# Patient Record
Sex: Male | Born: 1950 | Race: White | Hispanic: No | Marital: Married | Smoking: Former smoker
Health system: Southern US, Community
[De-identification: ages and names within clinical notes are randomized; demographics above are authoritative.]

## PROBLEM LIST (undated history)

## (undated) DIAGNOSIS — M1380 Other specified arthritis, unspecified site: Secondary | ICD-10-CM

## (undated) DIAGNOSIS — I4891 Unspecified atrial fibrillation: Secondary | ICD-10-CM

## (undated) DIAGNOSIS — I509 Heart failure, unspecified: Secondary | ICD-10-CM

## (undated) DIAGNOSIS — F0150 Vascular dementia without behavioral disturbance: Secondary | ICD-10-CM

## (undated) DIAGNOSIS — N183 Chronic kidney disease, stage 3 unspecified: Secondary | ICD-10-CM

## (undated) DIAGNOSIS — I700 Atherosclerosis of aorta: Secondary | ICD-10-CM

## (undated) DIAGNOSIS — R011 Cardiac murmur, unspecified: Secondary | ICD-10-CM

## (undated) DIAGNOSIS — I499 Cardiac arrhythmia, unspecified: Secondary | ICD-10-CM

## (undated) DIAGNOSIS — E119 Type 2 diabetes mellitus without complications: Secondary | ICD-10-CM

## (undated) DIAGNOSIS — I739 Peripheral vascular disease, unspecified: Secondary | ICD-10-CM

## (undated) DIAGNOSIS — M1990 Unspecified osteoarthritis, unspecified site: Secondary | ICD-10-CM

## (undated) DIAGNOSIS — I5030 Unspecified diastolic (congestive) heart failure: Secondary | ICD-10-CM

## (undated) DIAGNOSIS — I10 Essential (primary) hypertension: Secondary | ICD-10-CM

## (undated) DIAGNOSIS — R4586 Emotional lability: Secondary | ICD-10-CM

## (undated) DIAGNOSIS — E559 Vitamin D deficiency, unspecified: Secondary | ICD-10-CM

## (undated) DIAGNOSIS — Z79899 Other long term (current) drug therapy: Secondary | ICD-10-CM

## (undated) DIAGNOSIS — F419 Anxiety disorder, unspecified: Secondary | ICD-10-CM

## (undated) DIAGNOSIS — I639 Cerebral infarction, unspecified: Secondary | ICD-10-CM

## (undated) DIAGNOSIS — I6789 Other cerebrovascular disease: Secondary | ICD-10-CM

## (undated) DIAGNOSIS — Z7902 Long term (current) use of antithrombotics/antiplatelets: Secondary | ICD-10-CM

## (undated) DIAGNOSIS — E7800 Pure hypercholesterolemia, unspecified: Secondary | ICD-10-CM

## (undated) DIAGNOSIS — K219 Gastro-esophageal reflux disease without esophagitis: Secondary | ICD-10-CM

## (undated) DIAGNOSIS — M48062 Spinal stenosis, lumbar region with neurogenic claudication: Secondary | ICD-10-CM

## (undated) HISTORY — PX: BACK SURGERY: SHX140

## (undated) HISTORY — DX: Heart failure, unspecified: I50.9

## (undated) HISTORY — PX: COLONOSCOPY: SHX174

## (undated) HISTORY — DX: Cardiac arrhythmia, unspecified: I49.9

---

## 2016-07-17 DIAGNOSIS — I10 Essential (primary) hypertension: Secondary | ICD-10-CM | POA: Insufficient documentation

## 2016-07-17 DIAGNOSIS — E782 Mixed hyperlipidemia: Secondary | ICD-10-CM | POA: Insufficient documentation

## 2016-07-17 DIAGNOSIS — E1140 Type 2 diabetes mellitus with diabetic neuropathy, unspecified: Secondary | ICD-10-CM | POA: Insufficient documentation

## 2016-07-17 DIAGNOSIS — E538 Deficiency of other specified B group vitamins: Secondary | ICD-10-CM | POA: Insufficient documentation

## 2016-07-17 DIAGNOSIS — I493 Ventricular premature depolarization: Secondary | ICD-10-CM | POA: Insufficient documentation

## 2018-02-23 DIAGNOSIS — E1169 Type 2 diabetes mellitus with other specified complication: Secondary | ICD-10-CM | POA: Insufficient documentation

## 2018-07-09 DIAGNOSIS — I4891 Unspecified atrial fibrillation: Secondary | ICD-10-CM | POA: Insufficient documentation

## 2018-12-14 ENCOUNTER — Other Ambulatory Visit: Payer: Self-pay

## 2018-12-14 ENCOUNTER — Encounter: Payer: Self-pay | Admitting: Dietician

## 2018-12-14 ENCOUNTER — Encounter: Payer: Medicare Other | Attending: Surgery | Admitting: Dietician

## 2018-12-14 VITALS — Ht 74.0 in | Wt 267.3 lb

## 2018-12-14 DIAGNOSIS — Z794 Long term (current) use of insulin: Secondary | ICD-10-CM

## 2018-12-14 DIAGNOSIS — E119 Type 2 diabetes mellitus without complications: Secondary | ICD-10-CM | POA: Diagnosis not present

## 2018-12-14 DIAGNOSIS — E1169 Type 2 diabetes mellitus with other specified complication: Secondary | ICD-10-CM

## 2018-12-14 NOTE — Patient Instructions (Signed)
Establish a meal pattern of 3 meals spaced 4-5 hours apart. Balance meals with 2-4 oz protein, 2-3 servings of carbohydrate (30-45 gms) and non-starchy vegetables. Can include 1 serving of carbohydrate between and 1 oz protein between meals. Check Fasting Blood Sugars regularly in addition to pre -dinner meals.

## 2018-12-14 NOTE — Progress Notes (Signed)
Medical Nutrition Therapy:  Visit start time: 6440   end [id number]  Assessment:  Diagnosis: Type 2 diabetes Past medical history: hyperlipidemia, hypertension, B12 deficiency Psychosocial issues/ stress concerns:  Patient rates his stress as moderate and indicates "ok" as to how well he is dealing with his stress  Preferred learning method:  . Visual  Current weight:267.3 lbs Height: 74 in BMI: 34.32 Weight goal: 230 lbs Medications, supplements: see list  Progress and evaluation:  Patient accompanied by his wife in for initial medical nutrition therapy appointment. He reports he has not been consistent with checking his blood glucose in the past but that prior to July, '20, his readings had been in 140's-150's. The readings since then have been in 200's-300's.  He has had 2 steroid injections in his knees in the past 4 months.  He reports a 25 lb weight loss since April '20. He has been following a Keto diet, trying not to exceed 25 gms of carbohydrate daily but states there has not been an improvement in his glucose. He is increasing Toujeo by 2 unit increments as instructed by MD and FBG's remains in the 200's (230's to 270's). Food records indicate, as he reported, a very low intake of carbohydrate; eats mainly protein foods-some high fat choices. He does include some non-starchy vegetables    Physical activity: walks approximately 1 mile daily.  Dietary Intake:  Usual eating pattern includes 3 meals and 2 snacks per day. Dining out frequency: 1 meals per week.  Breakfast: 3 boiled eggs or 3 egg omelette with cheese/onions, 16 oz coffee with half/half Snack:pecans or mixed nuts Lunch:12-1:00 - leftover salad Snack: low carb yogurt, nuts Supper: Caesar salad with chicken Beverages: 4 (32oz) water, coffee, no sugar sweetened beverages.  Nutrition Care Education:  Diabetes:   Instructed on a meal plan for diabetes including carbohydrate counting and how to better balance  carbohydrate, protein, healthy fats and non-starchy vegetables. Expressed concern regarding fat content of Keto diet and difficulty of sustaining an extremely low carbohydrate diet. Discussed how food is not the only factor with glucose and his elevated readings are not necessarily diet related. Discussed difficulty of achieving glucose in recommended range during the day if fasting glucose is in 200's and how MD's instruction to keep increasing Toujeo in the evenings should help to decrease FBG's. Patient states he meets with PCP, 12/24/18, to follow-up regarding diabetes.   Nutritional Diagnosis:  NI-5.8.1 Inadequate carbohydrate intake As related to following Keto diet and limiting carbohydrate to 25 gms daily.  As evidenced by diet history..  Intervention:  Establish a meal pattern of 3 meals spaced 4-5 hours apart. Balance meals with 2-4 oz protein, 2-3 servings of carbohydrate (30-45 gms) and non-starchy vegetables. Can include 1 serving of carbohydrate between and 1 oz protein between meals. Check Fasting Blood Sugars regularly in addition to pre -dinner meals.  Education Materials given:  . Plate Planner . Food lists/ Planning A Balanced Meal . Sample meal pattern/ menus . Goals/ instructions  Learner/ who was taught:  . Patient  . Spouse/ partner Level of understanding: Marland Kitchen Verbalizes understanding  Demonstrated degree of understanding via:   Teach back Learning barriers: . None  Willingness to learn/ readiness for change: . Eager, change in progress  Monitoring and Evaluation:  Dietary intake, exercise,and body weight      follow up: 01/12/19 at 9:00am with Pam

## 2019-01-12 ENCOUNTER — Encounter: Payer: Self-pay | Admitting: Dietician

## 2019-01-12 ENCOUNTER — Other Ambulatory Visit: Payer: Self-pay

## 2019-01-12 ENCOUNTER — Encounter: Payer: Medicare Other | Attending: Surgery | Admitting: Dietician

## 2019-01-12 VITALS — Ht 74.0 in | Wt 271.3 lb

## 2019-01-12 DIAGNOSIS — E1169 Type 2 diabetes mellitus with other specified complication: Secondary | ICD-10-CM

## 2019-01-12 DIAGNOSIS — Z794 Long term (current) use of insulin: Secondary | ICD-10-CM

## 2019-01-12 DIAGNOSIS — E119 Type 2 diabetes mellitus without complications: Secondary | ICD-10-CM | POA: Insufficient documentation

## 2019-01-12 NOTE — Patient Instructions (Signed)
   Continue to increase daily physical activity by walking more often, and/or incorporating some stretches/ calisthenics every hour when sitting/ working.   Gradually include a consistent amount of carb foods with meals, start with 15-30 grams with every meal and gradually increase to 30-45 grams.   Combining carb and protein lessens the effect on blood sugar.

## 2019-01-12 NOTE — Progress Notes (Signed)
Medical Nutrition Therapy: Visit start time: 0930  end time: 1000  Assessment:  Diagnosis: type 2 diabetes Medical history changes: no changes Psychosocial issues/ stress concerns: none  Current weight: 271.3lbs Height: 6'2" Medications, supplement changes: reconciled list in medical record; now taking   Progress and evaluation:   Patient reports adding some carb foods into diet, including some beans in soup. He feels he should add back. Potato or rice ever 1-2 weeks. Occasional sandwich with low-carb bread or hamburger bun.  Weight has increased by 4lbs since previous visit; patient likely wearing heavier clothing which could have altered today's measurement.   Patient states he and his [place], and he works from home during the day, so physical activity is limited.   He reports some improvement in BGs with insulin change to 70/30, and is testing more regularly.      Physical activity: walking 20 minutes 2x daily; kayaking 2x a month for 2 hours  Dietary Intake:  Usual eating pattern includes 3 meals and 1-2 snacks per day. Dining out frequency: not assessed today.  Breakfast: coffee with splenda, small amount cream Snack: none or nuts or occasional low-carb yogurt Lunch: leftovers, often salad Snack: 1/4 cup nuts -- peanuts, almonds, cashews Supper: salad 3-4x a week with chicken or other meat (pork, beef, shrimp) Snack: none Beverages: 1c. coffee in am then water  Nutrition Care Education: Topics covered: diabetes, weight management Basic nutrition: appropriate nutrient balance, general nutrition guidelines    Weight control: reviewed progress since previous visit, estimated kcal needs for weight loss at about 1600kcal and provided breakdown of 40%CHO, 30% protein, and 30% fat per patient preference; discussed importance of limiting hidden sources of fat and sugar, portion control strategies, role of physical activity Diabetes: appropriate carb  intake and balance, healthy carb choices for lower glycemic response, functions of carbohydrate, role of fiber, protein, benefits of consistent carb intake.   Nutritional Diagnosis:  Gary-2.2 Altered nutrition-related laboratory As related to Type 2 diabetes.  As evidenced by patient with recent HbA1C of 10.5%. [place]-3.3 Overweight/obesity As related to excess calories and limited activity.  As evidenced by patient with current BMI of 34, and patient report of dietary intake.  Intervention:   Instruction and discussion as noted above.  Patient has been working on gradual diet changes to control blood sugars and promote weight loss.   Updated goals with input from patient.   Education Materials given:  . Plate Planner with food lists . Exercise packet . Goals/ instructions   Learner/ who was taught:  . Patient  . Spouse/ partner   Level of understanding: Marland Kitchen Verbalizes/ demonstrates competency   Demonstrated degree of understanding via:   Teach back Learning barriers: . None   Willingness to learn/ readiness for change: . Eager, change in progress   Monitoring and Evaluation:  Dietary intake, exercise, BG control, and body weight      follow up: 02/23/19 at 9:30am

## 2019-02-21 ENCOUNTER — Encounter: Payer: Self-pay | Admitting: Dietician

## 2019-02-21 NOTE — Progress Notes (Signed)
Patient rescheduled his MNT appointment from 02/23/19 to 03/09/19.

## 2019-02-23 ENCOUNTER — Ambulatory Visit: Payer: Medicare Other | Admitting: Dietician

## 2019-03-09 ENCOUNTER — Ambulatory Visit: Payer: Medicare Other | Admitting: Dietician

## 2019-03-28 ENCOUNTER — Ambulatory Visit: Payer: Medicare Other | Admitting: Dietician

## 2019-04-18 ENCOUNTER — Encounter: Payer: Self-pay | Admitting: Dietician

## 2019-04-18 NOTE — Progress Notes (Signed)
Have not heard back from patient after missing his 3rd subsequently scheduled appointment for 03/28/19. Sent notification to referring provider.

## 2019-06-03 ENCOUNTER — Ambulatory Visit: Payer: Medicare Other | Attending: Internal Medicine

## 2019-06-03 DIAGNOSIS — Z23 Encounter for immunization: Secondary | ICD-10-CM | POA: Insufficient documentation

## 2019-06-03 NOTE — Progress Notes (Signed)
   Covid-19 Vaccination Clinic  Name:  Tahjai Schetter    MRN: [id number] DOB: May 17, 1950  06/03/2019  Mr. Ammar was observed post Covid-19 immunization for 15 minutes without incidence. He was provided with Vaccine Information Sheet and instruction to access the V-Safe system.   Mr. Mcparland was instructed to call 911 with any severe reactions post vaccine: Marland Kitchen Difficulty breathing  . Swelling of your face and throat  . A fast heartbeat  . A bad rash all over your body  . Dizziness and weakness    Immunizations Administered    Name Date Dose VIS Date Route   Pfizer COVID-19 Vaccine 06/03/2019 11:00 AM 0.3 mL 04/01/2019 Intramuscular   Manufacturer: [employer], [employer]   Lot: [id number]   NDC: [address]-3

## 2019-06-29 ENCOUNTER — Ambulatory Visit: Payer: Medicare Other | Attending: Internal Medicine

## 2019-06-29 DIAGNOSIS — Z23 Encounter for immunization: Secondary | ICD-10-CM | POA: Insufficient documentation

## 2019-06-29 NOTE — Progress Notes (Signed)
   Covid-19 Vaccination Clinic  Name:  Kristopher Oneal    MRN: [id number] DOB: [birth year]  06/29/2019  [name] was observed post Covid-19 immunization for 15 minutes without incident. He was provided with Vaccine Information Sheet and instruction to access the V-Safe system.   [name] was instructed to call 911 with any severe reactions post vaccine: Marland Kitchen Difficulty breathing  . Swelling of face and throat  . A fast heartbeat  . A bad rash all over body  . Dizziness and weakness   Immunizations Administered    Name Date Dose VIS Date Route   Pfizer COVID-19 Vaccine 06/29/2019  9:15 AM 0.3 mL 04/01/2019 Intramuscular   Manufacturer: [employer], [employer]   Lot: [id number]   NDC: [address]-4

## 2019-12-27 ENCOUNTER — Other Ambulatory Visit: Payer: Self-pay

## 2019-12-27 ENCOUNTER — Encounter
Admission: RE | Admit: 2019-12-27 | Discharge: 2019-12-27 | Disposition: A | Discharge status: Home / Self Care | Payer: Medicare Other | Source: Ambulatory Visit | Attending: Orthopedic Surgery | Admitting: Orthopedic Surgery

## 2019-12-27 DIAGNOSIS — Z01818 Encounter for other preprocedural examination: Secondary | ICD-10-CM | POA: Insufficient documentation

## 2019-12-27 DIAGNOSIS — Z01810 Encounter for preprocedural cardiovascular examination: Secondary | ICD-10-CM

## 2019-12-27 HISTORY — DX: Type 2 diabetes mellitus without complications: E11.9

## 2019-12-27 HISTORY — DX: Essential (primary) hypertension: I10

## 2019-12-27 HISTORY — DX: Cardiac arrhythmia, unspecified: I49.9

## 2019-12-27 HISTORY — DX: Unspecified osteoarthritis, unspecified site: M19.90

## 2019-12-27 LAB — URINALYSIS, ROUTINE W REFLEX MICROSCOPIC
Bilirubin Urine: NEGATIVE
Glucose, UA: NEGATIVE mg/dL
Hgb urine dipstick: NEGATIVE
Ketones, ur: NEGATIVE mg/dL
Leukocytes,Ua: NEGATIVE
Nitrite: NEGATIVE
Protein, ur: NEGATIVE mg/dL
Specific Gravity, Urine: 1.009 (ref 1.005–1.030)
pH: 5 (ref 5.0–8.0)

## 2019-12-27 LAB — HEMOGLOBIN A1C
Hgb A1c MFr Bld: 7.2 % — ABNORMAL HIGH (ref 4.8–5.6)
Mean Plasma Glucose: 159.94 mg/dL

## 2019-12-27 LAB — SEDIMENTATION RATE: Sed Rate: 8 mm/hr (ref 0–20)

## 2019-12-27 LAB — COMPREHENSIVE METABOLIC PANEL
ALT: 25 U/L (ref 0–44)
AST: 27 U/L (ref 15–41)
Albumin: 4.5 g/dL (ref 3.5–5.0)
Alkaline Phosphatase: 36 U/L — ABNORMAL LOW (ref 38–126)
Anion gap: 11 (ref 5–15)
BUN: 32 mg/dL — ABNORMAL HIGH (ref 8–23)
CO2: 27 mmol/L (ref 22–32)
Calcium: 9.8 mg/dL (ref 8.9–10.3)
Chloride: 100 mmol/L (ref 98–111)
Creatinine, Ser: 1.29 mg/dL — ABNORMAL HIGH (ref 0.61–1.24)
GFR calc Af Amer: >60 mL/min (ref >60)
GFR calc non Af Amer: 55 mL/min — ABNORMAL LOW (ref >60)
Glucose, Bld: 135 mg/dL — ABNORMAL HIGH (ref 70–99)
Potassium: 3.8 mmol/L (ref 3.5–5.1)
Sodium: 138 mmol/L (ref 135–145)
Total Bilirubin: 1 mg/dL (ref 0.3–1.2)
Total Protein: 7.9 g/dL (ref 6.5–8.1)

## 2019-12-27 LAB — CBC
HCT: 43.8 % (ref 39.0–52.0)
Hemoglobin: 15.3 g/dL (ref 13.0–17.0)
MCH: 32.4 pg (ref 26.0–34.0)
MCHC: 34.9 g/dL (ref 30.0–36.0)
MCV: 92.8 fL (ref 80.0–100.0)
Platelets: 234 10*3/uL (ref 150–400)
RBC: 4.72 MIL/uL (ref 4.22–5.81)
RDW: 13.3 % (ref 11.5–15.5)
WBC: 9.9 10*3/uL (ref 4.0–10.5)
nRBC: 0 % (ref 0.0–0.2)

## 2019-12-27 LAB — PROTIME-INR
INR: 1 (ref 0.8–1.2)
Prothrombin Time: 12.8 seconds (ref 11.4–15.2)

## 2019-12-27 LAB — C-REACTIVE PROTEIN: CRP: <0.5 mg/dL (ref <1.0)

## 2019-12-27 LAB — TYPE AND SCREEN
ABO/RH(D): A POS
Antibody Screen: NEGATIVE

## 2019-12-27 LAB — SURGICAL PCR SCREEN
MRSA, PCR: NEGATIVE
Staphylococcus aureus: NEGATIVE

## 2019-12-27 LAB — APTT: aPTT: 29 seconds (ref 24–36)

## 2019-12-27 NOTE — Patient Instructions (Addendum)
Your procedure is scheduled on: Mon 9/13 Report to Day Surgery. To find out your arrival time please call [phone] between 1PM - 3PM on Friday 9/10.  Remember: Instructions that are not followed completely may result in serious medical risk,  up to and including death, or upon the discretion of your surgeon and anesthesiologist your  surgery may need to be rescheduled.     _X__ 1. Do not eat food after midnight the night before your procedure.                 No chewing gum or hard candies. You may drink clear liquids up to 2 hours                 before you are scheduled to arrive for your surgery- DO not drink clear                 liquids within 2 hours of the start of your surgery.                 Clear Liquids include:  water, G2 or                  Gatorade Zero (avoid Red/Purple/Blue), Black Coffee or Tea (Do not add                 anything to coffee or tea). _x____2.   Complete G2.   2 hours before you arrive  __X__2.  On the morning of surgery brush your teeth with toothpaste and water, you                may rinse your mouth with mouthwash if you wish.  Do not swallow any toothpaste of mouthwash.     _X__ 3.  No Alcohol for 24 hours before or after surgery.   ___ 4.  Do Not Smoke or use e-cigarettes For 24 Hours Prior to Your Surgery.                 Do not use any chewable tobacco products for at least 6 hours prior to                 Surgery.  ___  5.  Do not use any recreational drugs (marijuana, cocaine, heroin, ecstasy, MDMA or other)                For at least one week prior to your surgery.  Combination of these drugs with anesthesia                May have life threatening results.  ____  6.  Bring all medications with you on the day of surgery if instructed.   __x__  7.  Notify your doctor if there is any change in your medical condition      (cold, fever, infections).     Do not wear jewelry,. Do not wear lotions, You may wear  deodorant. Do not shave 48 hours prior to surgery. Men may shave face and neck. Do not bring valuables to the hospital.    [facility] is not responsible for any belongings or valuables.  Contacts, dentures or bridgework may not be worn into surgery. Leave your suitcase in the car. After surgery it may be brought to your room. For patients admitted to the hospital, discharge time is determined by your treatment team.   Patients discharged the day of surgery will not be allowed to drive home.  Make arrangements for someone to be with you for the first 24 hours of your Same Day Discharge.    Please read over the following fact sheets that you were given:   Incentive spirometer    __x__ Take these medicines the morning of surgery with A SIP OF WATER:    1. fenofibrate 160 MG tablet  2. metoprolol succinate (TOPROL-XL) 100 MG 24 hr tablet  3.   4.  5.  6.  ____ Fleet Enema (as directed)   __x__ Use CHG Soap (or wipes) as directed  ____ Use Benzoyl Peroxide Gel as instructed  ____ Use inhalers on the day of surgery  __x__ Stop metformin 2 days prior to surgery Last dose on 9/10   __x__ Take 1/2 of usual insulin dose the night before surgery. No insulin the morning          of surgery.   __x__ Stop aspirin today  __x__ Stop Anti-inflammatories on meloxicam (MOBIC) 15 MG tablet today   May take tylenol   __x__ Stop supplements until after surgery.    ____ Bring C-Pap to the hospital.    If you have any questions regarding your pre-procedure instructions,  Please call Pre-admit Testing at [phone] [facility]

## 2019-12-28 LAB — URINE CULTURE
Culture: <10000 — AB
Special Requests: NORMAL

## 2019-12-29 ENCOUNTER — Other Ambulatory Visit
Admission: RE | Admit: 2019-12-29 | Discharge: 2019-12-29 | Disposition: A | Discharge status: Home / Self Care | Payer: Medicare Other | Source: Ambulatory Visit | Attending: Orthopedic Surgery | Admitting: Orthopedic Surgery

## 2019-12-29 ENCOUNTER — Other Ambulatory Visit: Payer: Self-pay

## 2019-12-29 DIAGNOSIS — Z01812 Encounter for preprocedural laboratory examination: Secondary | ICD-10-CM | POA: Diagnosis present

## 2019-12-29 DIAGNOSIS — Z20822 Contact with and (suspected) exposure to covid-19: Secondary | ICD-10-CM | POA: Insufficient documentation

## 2019-12-30 LAB — SARS CORONAVIRUS 2 (TAT 6-24 HRS): SARS Coronavirus 2: NEGATIVE

## 2020-01-01 ENCOUNTER — Encounter: Payer: Self-pay | Admitting: Orthopedic Surgery

## 2020-01-01 NOTE — H&P (Signed)
ORTHOPAEDIC HISTORY & PHYSICAL Progress Notes [name], [place] - 12/27/2019 8:45 AM EDT [facility] Chief Complaint:   Chief Complaint  Patient presents with  . Knee Pain  H & P RIGHT KNEE   History of Present Illness:   Kristopher Oneal is a 69 y.o. male that presents to clinic today for his preoperative history and evaluation. Patient presents with his wife. The patient is scheduled to undergo a right total knee arthroplasty on 01/02/20 by Dr. Marry Guan. His pain began several years ago. The pain is located primarily along the medial aspect of the knee. He describes his pain as worse with weight bearing, standing, and walking. He reports associated minimal swelling with some giving way of the knee. He denies associated numbness or tingling, denies locking of the knee.   The patient's symptoms have progressed to the point that they decrease his quality of life. The patient has previously undergone conservative treatment including NSAIDS and injections to the knee without adequate control of his symptoms.  Of note, patient reports a history of lower back surgery but no hardware. He denies significant cardiac history, denies history of blood clots.   Past Medical, Surgical, Family, Social History, Allergies, Medications:   Past Medical History:  Past Medical History:  Diagnosis Date  . Benign essential hypertension 07/17/2016  . Chronic painful diabetic neuropathy (CMS-HCC) 07/17/2016  . Controlled type 2 diabetes mellitus with complication, with long-term current use of insulin (CMS-HCC) 07/17/2016  . Hyperlipidemia, mixed 07/17/2016  . 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 LAMINECTOMY LUMBAR SPINE 1993   Current Medications:  Current Outpatient Medications  Medication Sig Dispense Refill  . diltiazem (DILT-XR) 240 MG XR capsule Take 240 mg by mouth once daily  .  ascorbic acid, vitamin C, (VITAMIN C) 1000 MG tablet Take 1,000 mg by mouth once daily Per patient  . aspirin 325 MG EC tablet Take 325 mg by mouth once daily  . bumetanide (BUMEX) 1 MG tablet Take 1 tablet (1 mg total) by mouth once daily 90 tablet 3  . cyanocobalamin/cobamamide (B12 SL) Place under the tongue 1000 mcg one daily  . fenofibrate 160 MG tablet TAKE 1 TABLET BY MOUTH ONCE DAILY 90 tablet 3  . flash glucose sensor (FREESTYLE LIBRE 14 DAY SENSOR) kit Use 1 kit every 14 (fourteen) days 2 kit 12  . FREESTYLE LIBRE 14 DAY reader Use 1 Device as directed 1 each 0  . gabapentin (NEURONTIN) 300 MG capsule TAKE 3 CAPSULES BY MOUTH AT NIGHT 270 capsule 3  . glipiZIDE (GLUCOTROL) 5 MG tablet Take 1 tablet (5 mg total) by mouth every morning before breakfast for 90 days 90 tablet 3  . glucosam-chondroitin-diet cb25 116-100 mg Cap Take 1 capsule by mouth once daily  . insulin NPH-REGULAR (HUMULIN 70/30 PEN) 100 unit/mL (70-30) pen injector 60 units in the morning before breakfast and 30 in the evening before dinner (Patient taking differently: Novolin 70/30, 60 units in the morning before breakfast and 35 units at bedtime 10/28/2019 ) 90 mL 1  . lovastatin (MEVACOR) 40 MG tablet TAKE 1 TABLET BY MOUTH DAILY WITH DINNER 90 tablet 3  . meloxicam (MOBIC) 15 MG tablet TAKE 1 TABLET BY MOUTH ONCE DAILY 90 tablet 3  . metFORMIN (GLUCOPHAGE) 1000 MG tablet Take 1 tablet (1,000 mg total) by mouth 2 (two) times daily Take 1 tablet by mouth (1000 mg total)  twice daily 180 tablet 3  . metoprolol succinate (TOPROL-XL) 100 MG XL tablet TAKE 1 TABLET BY MOUTH ONCE DAILY 90 tablet 3  . multivitamin tablet Take 1 tablet by mouth once daily  . multivitamin tablet Take by mouth  . pen needle, diabetic 32 gauge x 5/32" Ndle Use 1 each 2 (two) times daily 100 each 4  . potassium gluconate 2.5 mEq Tab Take by mouth  . triamterene-hydrochlorothiazide (DYAZIDE) 37.5-25 mg capsule Take 1 capsule by mouth every morning 90  capsule 3   No current facility-administered medications for this visit.   Allergies: No Known Allergies  Social History:  Social History   Socioeconomic History  . Marital status: Married  Spouse name: Helene Kelp  . Number of children: 1  . Years of education: 57  . Highest education level: Not on file  Occupational History  . Occupation: Full-time- Teaching laboratory technician  Tobacco Use  . Smoking status: Former Smoker  Types: Cigarettes  Quit date: 1980  Years since quitting: 41.7  . Smokeless tobacco: Never Used  Vaping Use  . Vaping Use: Never used  Substance and Sexual Activity  . Alcohol use: No  . Drug use: Never  . Sexual activity: Defer  Partners: Female  Other Topics Concern  . Not on file  Social History Narrative  . Not on file   Social Determinants of Health   Financial Resource Strain:  . Difficulty of Paying Living Expenses:  Food Insecurity:  . Worried About Charity fundraiser in the Last Year:  . Arboriculturist in the Last Year:  Transportation Needs:  . Film/video editor (Medical):  Marland Kitchen Lack of Transportation (Non-Medical):  Physical Activity:  . Days of Exercise per Week:  . Minutes of Exercise per Session:  Stress:  . Feeling of Stress :  Social Connections:  . Frequency of Communication with Friends and Family:  . Frequency of Social Gatherings with Friends and Family:  . Attends Religious Services:  . Active Member of Clubs or Organizations:  . Attends Archivist Meetings:  Marland Kitchen Marital Status:   Family History:  Family History  Problem Relation Age of Onset  . Alzheimer's disease Mother  . No Known Problems Father   Review of Systems:   A 10+ ROS was performed, reviewed, and the pertinent orthopaedic findings are documented in the HPI.   Physical Examination:   BP 120/70  Ht 188 cm (_0 )  Wt (!) 123.3 kg (271 lb 12.8 oz)  BMI 34.90 kg/m   Patient is a well-developed, well-nourished male in no acute distress. Patient has  normal mood and affect. Patient is alert and oriented to person, place, and time.   HEENT: Atraumatic, normocephalic. Pupils equal and reactive to light. Extraocular motion intact. Noninjected sclera.  Cardiovascular: Irregular rate and rhythm, with no murmurs, rubs, or gallops. Distal pulses auscultated with doppler.  Respiratory: Lungs clear to auscultation bilaterally.   Right Knee: Soft tissue swelling: minimal Effusion: none Erythema: none Crepitance: mild Tenderness: medial Alignment: relative varus Mediolateral laxity: medial pseudolaxity Posterior sag: negative Patellar tracking: Good tracking without evidence of subluxation or tilt Atrophy: No significant atrophy.  Quadriceps tone was good. Range of motion: 0/6/123 degrees  Sensation intact over the saphenous, lateral sural cutaneous, superficial fibular, and deep fibular nerve distributions.  Tests Performed/Reviewed:  X-rays  No new radiographs were obtained today. Previous radiographs were reviewed of the right knee and revealed complete loss of medial compartment joint space with bone-on-bone contact and  mild osteophyte formation. Lateral compartment reveals mild loss of joint space with osteophyte formation. Patellofemoral compartment reveals loss of joint space with osteophyte formation. No fractures or dislocations.  Impression:   ICD-10-CM  1. Primary osteoarthritis of right knee M17.11   Plan:   The patient has end-stage degenerative changes of the right knee. It was explained to the patient that the condition is progressive in nature. Having failed conservative treatment, the patient has elected to proceed with a total joint arthroplasty. The patient will undergo a total joint arthroplasty with Dr. Marry Guan. The risks of surgery, including blood clot and infection, were discussed with the patient. Measures to reduce these risks, including the use of anticoagulation, perioperative antibiotics, and early ambulation  were discussed. The importance of postoperative physical therapy was discussed with the patient. The patient elects to proceed with surgery. The patient is instructed to stop all blood thinners prior to surgery. The patient is instructed to call the hospital the day before surgery to learn of the proper arrival time.   Contact our office with any questions or concerns. Follow up as indicated, or sooner should any new problems arise, if conditions worsen, or if they are otherwise concerned.   [name], PA-C [facility] [place], [place] [address] Phone: [phone]  This note was generated in part with voice recognition software and I apologize for any typographical errors that were not detected and corrected.   Electronically signed by [name], PA at 12/27/2019 10:20 AM EDT

## 2020-01-02 ENCOUNTER — Ambulatory Visit: Payer: Medicare Other | Admitting: Urgent Care

## 2020-01-02 ENCOUNTER — Encounter
Admission: RE | Disposition: A | Discharge status: Home / Self Care | Payer: Self-pay | Source: Home / Self Care | Attending: Orthopedic Surgery

## 2020-01-02 ENCOUNTER — Ambulatory Visit: Payer: Medicare Other

## 2020-01-02 ENCOUNTER — Ambulatory Visit
Admission: RE | Admit: 2020-01-02 | Discharge: 2020-01-02 | Disposition: A | Discharge status: Home / Self Care | Payer: Medicare Other | Attending: Orthopedic Surgery | Admitting: Orthopedic Surgery

## 2020-01-02 ENCOUNTER — Other Ambulatory Visit: Payer: Self-pay

## 2020-01-02 ENCOUNTER — Encounter: Payer: Self-pay | Admitting: Orthopedic Surgery

## 2020-01-02 DIAGNOSIS — E782 Mixed hyperlipidemia: Secondary | ICD-10-CM | POA: Diagnosis not present

## 2020-01-02 DIAGNOSIS — M1711 Unilateral primary osteoarthritis, right knee: Secondary | ICD-10-CM | POA: Insufficient documentation

## 2020-01-02 DIAGNOSIS — Z96659 Presence of unspecified artificial knee joint: Secondary | ICD-10-CM

## 2020-01-02 DIAGNOSIS — I10 Essential (primary) hypertension: Secondary | ICD-10-CM | POA: Insufficient documentation

## 2020-01-02 DIAGNOSIS — Z87891 Personal history of nicotine dependence: Secondary | ICD-10-CM | POA: Insufficient documentation

## 2020-01-02 DIAGNOSIS — Z7982 Long term (current) use of aspirin: Secondary | ICD-10-CM | POA: Insufficient documentation

## 2020-01-02 DIAGNOSIS — Z96651 Presence of right artificial knee joint: Secondary | ICD-10-CM

## 2020-01-02 DIAGNOSIS — E1140 Type 2 diabetes mellitus with diabetic neuropathy, unspecified: Secondary | ICD-10-CM | POA: Diagnosis not present

## 2020-01-02 DIAGNOSIS — Z79899 Other long term (current) drug therapy: Secondary | ICD-10-CM | POA: Insufficient documentation

## 2020-01-02 DIAGNOSIS — Z794 Long term (current) use of insulin: Secondary | ICD-10-CM | POA: Insufficient documentation

## 2020-01-02 HISTORY — PX: KNEE ARTHROPLASTY: SHX992

## 2020-01-02 LAB — GLUCOSE, CAPILLARY
Glucose-Capillary: 240 mg/dL — ABNORMAL HIGH (ref 70–99)
Glucose-Capillary: 277 mg/dL — ABNORMAL HIGH (ref 70–99)
Glucose-Capillary: 308 mg/dL — ABNORMAL HIGH (ref 70–99)

## 2020-01-02 LAB — ABO/RH: ABO/RH(D): A POS

## 2020-01-02 SURGERY — ARTHROPLASTY, KNEE, TOTAL, USING IMAGELESS COMPUTER-ASSISTED NAVIGATION
Anesthesia: Spinal | Site: Knee | Laterality: Right

## 2020-01-02 MED ORDER — TRANEXAMIC ACID-NACL 1000-0.7 MG/100ML-% IV SOLN
1000.0000 mg | Freq: Once | INTRAVENOUS | Status: AC
  Administered 2020-01-02: 1000 mg via INTRAVENOUS

## 2020-01-02 MED ORDER — CELECOXIB 200 MG PO CAPS
ORAL_CAPSULE | ORAL | Status: AC
  Administered 2020-01-02: 400 mg via ORAL
  Filled 2020-01-02: qty 2

## 2020-01-02 MED ORDER — TRANEXAMIC ACID-NACL 1000-0.7 MG/100ML-% IV SOLN
1000.0000 mg | INTRAVENOUS | Status: AC
  Administered 2020-01-02: 1000 mg via INTRAVENOUS

## 2020-01-02 MED ORDER — FAMOTIDINE 20 MG PO TABS
ORAL_TABLET | ORAL | Status: AC
  Administered 2020-01-02: 20 mg via ORAL
  Filled 2020-01-02: qty 1

## 2020-01-02 MED ORDER — PHENYLEPHRINE HCL (PRESSORS) 10 MG/ML IV SOLN
INTRAVENOUS | Status: AC
  Filled 2020-01-02: qty 1

## 2020-01-02 MED ORDER — TRAMADOL HCL 50 MG PO TABS: 50.0000 mg | ORAL_TABLET | Freq: Four times a day (QID) | ORAL | 0 refills | Status: DC | PRN

## 2020-01-02 MED ORDER — LACTATED RINGERS IV BOLUS
500.0000 mL | Freq: Once | INTRAVENOUS | Status: AC
  Administered 2020-01-02: 500 mL via INTRAVENOUS

## 2020-01-02 MED ORDER — BISACODYL 10 MG RE SUPP: 10.0000 mg | Freq: Every day | RECTAL | Status: DC | PRN

## 2020-01-02 MED ORDER — MENTHOL 3 MG MT LOZG: 1.0000 | LOZENGE | OROMUCOSAL | Status: DC | PRN

## 2020-01-02 MED ORDER — TRAMADOL HCL 50 MG PO TABS: 50.0000 mg | ORAL_TABLET | ORAL | Status: DC | PRN

## 2020-01-02 MED ORDER — BUPIVACAINE HCL (PF) 0.25 % IJ SOLN
INTRAMUSCULAR | Status: AC
  Filled 2020-01-02: qty 360

## 2020-01-02 MED ORDER — ACETAMINOPHEN 10 MG/ML IV SOLN
INTRAVENOUS | Status: DC | PRN
  Administered 2020-01-02: 1000 mg via INTRAVENOUS

## 2020-01-02 MED ORDER — BUPIVACAINE HCL (PF) 0.25 % IJ SOLN
INTRAMUSCULAR | Status: DC | PRN
  Administered 2020-01-02: 60 mL

## 2020-01-02 MED ORDER — CELECOXIB 200 MG PO CAPS: 200.0000 mg | ORAL_CAPSULE | Freq: Two times a day (BID) | ORAL | 2 refills | Status: DC

## 2020-01-02 MED ORDER — DEXMEDETOMIDINE (PRECEDEX) IN NS 20 MCG/5ML (4 MCG/ML) IV SYRINGE
PREFILLED_SYRINGE | INTRAVENOUS | Status: AC
  Filled 2020-01-02: qty 5

## 2020-01-02 MED ORDER — ONDANSETRON HCL 4 MG PO TABS: 4.0000 mg | ORAL_TABLET | Freq: Four times a day (QID) | ORAL | Status: DC | PRN

## 2020-01-02 MED ORDER — PROPOFOL 500 MG/50ML IV EMUL
INTRAVENOUS | Status: AC
  Filled 2020-01-02: qty 50

## 2020-01-02 MED ORDER — LACTATED RINGERS IV BOLUS: 250.0000 mL | Freq: Once | INTRAVENOUS | Status: DC

## 2020-01-02 MED ORDER — DEXMEDETOMIDINE HCL 200 MCG/2ML IV SOLN
INTRAVENOUS | Status: DC | PRN
  Administered 2020-01-02 (×2): 8 ug via INTRAVENOUS
  Administered 2020-01-02: 4 ug via INTRAVENOUS

## 2020-01-02 MED ORDER — FENTANYL CITRATE (PF) 100 MCG/2ML IJ SOLN: 25.0000 ug | INTRAMUSCULAR | Status: DC | PRN

## 2020-01-02 MED ORDER — ALUM & MAG HYDROXIDE-SIMETH 200-200-20 MG/5ML PO SUSP: 30.0000 mL | ORAL | Status: DC | PRN

## 2020-01-02 MED ORDER — SODIUM CHLORIDE 0.9 % IV SOLN
INTRAVENOUS | Status: DC | PRN
  Administered 2020-01-02: 60 mL

## 2020-01-02 MED ORDER — METOCLOPRAMIDE HCL 10 MG PO TABS: 10.0000 mg | ORAL_TABLET | Freq: Three times a day (TID) | ORAL | Status: DC

## 2020-01-02 MED ORDER — FENTANYL CITRATE (PF) 100 MCG/2ML IJ SOLN
INTRAMUSCULAR | Status: AC
  Filled 2020-01-02: qty 2

## 2020-01-02 MED ORDER — MAGNESIUM HYDROXIDE 400 MG/5ML PO SUSP: 30.0000 mL | Freq: Every day | ORAL | Status: DC

## 2020-01-02 MED ORDER — DEXAMETHASONE SODIUM PHOSPHATE 10 MG/ML IJ SOLN: 8.0000 mg | Freq: Once | INTRAMUSCULAR | Status: AC

## 2020-01-02 MED ORDER — PHENYLEPHRINE HCL (PRESSORS) 10 MG/ML IV SOLN
INTRAVENOUS | Status: DC | PRN
  Administered 2020-01-02: 100 ug via INTRAVENOUS

## 2020-01-02 MED ORDER — BUPIVACAINE HCL (PF) 0.5 % IJ SOLN
INTRAMUSCULAR | Status: DC | PRN
  Administered 2020-01-02: 3 mL

## 2020-01-02 MED ORDER — GLIPIZIDE 5 MG PO TABS
5.0000 mg | ORAL_TABLET | Freq: Every day | ORAL | Status: AC
  Administered 2020-01-02: 5 mg via ORAL
  Filled 2020-01-02: qty 1

## 2020-01-02 MED ORDER — BUPIVACAINE LIPOSOME 1.3 % IJ SUSP
INTRAMUSCULAR | Status: AC
  Filled 2020-01-02: qty 40

## 2020-01-02 MED ORDER — FAMOTIDINE 20 MG PO TABS: 20.0000 mg | ORAL_TABLET | Freq: Once | ORAL | Status: AC

## 2020-01-02 MED ORDER — CEFAZOLIN SODIUM-DEXTROSE 2-4 GM/100ML-% IV SOLN: 2.0000 g | Freq: Four times a day (QID) | INTRAVENOUS | Status: DC

## 2020-01-02 MED ORDER — CEFAZOLIN SODIUM-DEXTROSE 2-4 GM/100ML-% IV SOLN
INTRAVENOUS | Status: AC
  Administered 2020-01-02: 2 g via INTRAVENOUS
  Filled 2020-01-02: qty 100

## 2020-01-02 MED ORDER — SODIUM CHLORIDE FLUSH 0.9 % IV SOLN
INTRAVENOUS | Status: AC
  Filled 2020-01-02: qty 20

## 2020-01-02 MED ORDER — SODIUM CHLORIDE 0.9 % IV SOLN: INTRAVENOUS | Status: DC

## 2020-01-02 MED ORDER — PHENOL 1.4 % MT LIQD: 1.0000 | OROMUCOSAL | Status: DC | PRN

## 2020-01-02 MED ORDER — OXYCODONE HCL 5 MG PO TABS: 5.0000 mg | ORAL_TABLET | ORAL | 0 refills | Status: DC | PRN

## 2020-01-02 MED ORDER — PROPOFOL 500 MG/50ML IV EMUL
INTRAVENOUS | Status: DC | PRN
  Administered 2020-01-02: 50 ug/kg/min via INTRAVENOUS

## 2020-01-02 MED ORDER — PANTOPRAZOLE SODIUM 40 MG PO TBEC: 40.0000 mg | DELAYED_RELEASE_TABLET | Freq: Two times a day (BID) | ORAL | Status: DC

## 2020-01-02 MED ORDER — SODIUM CHLORIDE FLUSH 0.9 % IV SOLN
INTRAVENOUS | Status: AC
  Filled 2020-01-02: qty 80

## 2020-01-02 MED ORDER — ORAL CARE MOUTH RINSE: 15.0000 mL | Freq: Once | OROMUCOSAL | Status: AC

## 2020-01-02 MED ORDER — PROPOFOL 10 MG/ML IV BOLUS
INTRAVENOUS | Status: AC
  Filled 2020-01-02: qty 20

## 2020-01-02 MED ORDER — CHLORHEXIDINE GLUCONATE 0.12 % MT SOLN: 15.0000 mL | Freq: Once | OROMUCOSAL | Status: AC

## 2020-01-02 MED ORDER — TRANEXAMIC ACID-NACL 1000-0.7 MG/100ML-% IV SOLN
INTRAVENOUS | Status: AC
  Filled 2020-01-02: qty 100

## 2020-01-02 MED ORDER — CHLORHEXIDINE GLUCONATE 4 % EX LIQD: 60.0000 mL | Freq: Once | CUTANEOUS | Status: DC

## 2020-01-02 MED ORDER — DEXAMETHASONE SODIUM PHOSPHATE 10 MG/ML IJ SOLN
INTRAMUSCULAR | Status: AC
  Administered 2020-01-02: 8 mg via INTRAVENOUS
  Filled 2020-01-02: qty 1

## 2020-01-02 MED ORDER — NEOMYCIN-POLYMYXIN B GU 40-200000 IR SOLN
Status: DC | PRN
  Administered 2020-01-02: 16 mL

## 2020-01-02 MED ORDER — FLEET ENEMA 7-19 GM/118ML RE ENEM: 1.0000 | ENEMA | Freq: Once | RECTAL | Status: DC | PRN

## 2020-01-02 MED ORDER — SENNOSIDES-DOCUSATE SODIUM 8.6-50 MG PO TABS
1.0000 | ORAL_TABLET | Freq: Two times a day (BID) | ORAL | Status: DC
Start: 2020-01-02 — End: 2020-01-02

## 2020-01-02 MED ORDER — CEFAZOLIN SODIUM-DEXTROSE 2-4 GM/100ML-% IV SOLN
2.0000 g | INTRAVENOUS | Status: AC
  Administered 2020-01-02: 2 g via INTRAVENOUS

## 2020-01-02 MED ORDER — INSULIN ASPART 100 UNIT/ML ~~LOC~~ SOLN
SUBCUTANEOUS | Status: AC
  Administered 2020-01-02: 5 [IU]
  Filled 2020-01-02: qty 1

## 2020-01-02 MED ORDER — ACETAMINOPHEN 10 MG/ML IV SOLN: 1000.0000 mg | Freq: Four times a day (QID) | INTRAVENOUS | Status: DC

## 2020-01-02 MED ORDER — ENSURE ENLIVE PO LIQD: 296.0000 mL | Freq: Once | ORAL | Status: DC

## 2020-01-02 MED ORDER — METFORMIN HCL 500 MG PO TABS
1000.0000 mg | ORAL_TABLET | Freq: Two times a day (BID) | ORAL | Status: AC
  Administered 2020-01-02: 1000 mg via ORAL
  Filled 2020-01-02: qty 2

## 2020-01-02 MED ORDER — SURGIPHOR WOUND IRRIGATION SYSTEM - OPTIME
TOPICAL | Status: DC | PRN
  Administered 2020-01-02: 450 mL via TOPICAL

## 2020-01-02 MED ORDER — ENOXAPARIN SODIUM 40 MG/0.4ML ~~LOC~~ SOLN: 40.0000 mg | SUBCUTANEOUS | Status: DC

## 2020-01-02 MED ORDER — ONDANSETRON HCL 4 MG/2ML IJ SOLN: 4.0000 mg | Freq: Four times a day (QID) | INTRAMUSCULAR | Status: DC | PRN

## 2020-01-02 MED ORDER — GABAPENTIN 300 MG PO CAPS: 300.0000 mg | ORAL_CAPSULE | Freq: Once | ORAL | Status: AC

## 2020-01-02 MED ORDER — BUPIVACAINE HCL (PF) 0.5 % IJ SOLN
INTRAMUSCULAR | Status: AC
  Filled 2020-01-02: qty 10

## 2020-01-02 MED ORDER — DIPHENHYDRAMINE HCL 12.5 MG/5ML PO ELIX: 12.5000 mg | ORAL_SOLUTION | ORAL | Status: DC | PRN

## 2020-01-02 MED ORDER — CELECOXIB 200 MG PO CAPS: 400.0000 mg | ORAL_CAPSULE | Freq: Once | ORAL | Status: AC

## 2020-01-02 MED ORDER — SODIUM CHLORIDE 0.9 % IV SOLN
INTRAVENOUS | Status: DC | PRN
  Administered 2020-01-02: 50 ug/min via INTRAVENOUS

## 2020-01-02 MED ORDER — ACETAMINOPHEN 10 MG/ML IV SOLN
INTRAVENOUS | Status: AC
  Filled 2020-01-02: qty 100

## 2020-01-02 MED ORDER — CEFAZOLIN SODIUM-DEXTROSE 2-4 GM/100ML-% IV SOLN
INTRAVENOUS | Status: AC
  Filled 2020-01-02: qty 100

## 2020-01-02 MED ORDER — FERROUS SULFATE 325 (65 FE) MG PO TABS: 325.0000 mg | ORAL_TABLET | Freq: Two times a day (BID) | ORAL | Status: DC

## 2020-01-02 MED ORDER — ENOXAPARIN SODIUM 40 MG/0.4ML ~~LOC~~ SOLN: 40.0000 mg | SUBCUTANEOUS | 0 refills | Status: DC

## 2020-01-02 MED ORDER — GABAPENTIN 300 MG PO CAPS
ORAL_CAPSULE | ORAL | Status: AC
  Administered 2020-01-02: 300 mg via ORAL
  Filled 2020-01-02: qty 1

## 2020-01-02 MED ORDER — CHLORHEXIDINE GLUCONATE 0.12 % MT SOLN
OROMUCOSAL | Status: AC
  Administered 2020-01-02: 15 mL via OROMUCOSAL
  Filled 2020-01-02: qty 15

## 2020-01-02 MED ORDER — INSULIN ASPART 100 UNIT/ML ~~LOC~~ SOLN: 5.0000 [IU] | Freq: Once | SUBCUTANEOUS | Status: DC

## 2020-01-02 MED ORDER — ONDANSETRON HCL 4 MG/2ML IJ SOLN: 4.0000 mg | Freq: Once | INTRAMUSCULAR | Status: DC | PRN

## 2020-01-02 SURGICAL SUPPLY — 81 items
ATTUNE MED DOME PAT 41 KNEE (Knees) ×1 IMPLANT
ATTUNE PS FEM RT SZ 6 CEM KNEE (Femur) ×1 IMPLANT
ATTUNE PSRP INSR SZ6 6 KNEE (Insert) ×1 IMPLANT
BASE TIBIAL ROT PLAT SZ 8 KNEE (Knees) IMPLANT
BATTERY INSTRU NAVIGATION (MISCELLANEOUS) ×8 IMPLANT
BLADE SAW 70X12.5 (BLADE) ×2 IMPLANT
BLADE SAW 90X13X1.19 OSCILLAT (BLADE) ×2 IMPLANT
BLADE SAW 90X25X1.19 OSCILLAT (BLADE) ×2 IMPLANT
BONE CEMENT GENTAMICIN (Cement) ×2 IMPLANT
BSPLAT TIB 8 CMNT ROT PLAT STR (Knees) ×1 IMPLANT
BTRY SRG DRVR LF (MISCELLANEOUS) ×4
CANISTER PREVENA 45 (CANNISTER) ×1 IMPLANT
CANISTER PREVENA PLUS 150 (CANNISTER) IMPLANT
CANISTER SUCT 3000ML PPV (MISCELLANEOUS) ×2 IMPLANT
CEMENT BONE GENTAMICIN 40 (Cement) IMPLANT
COOLER POLAR GLACIER W/PUMP (MISCELLANEOUS) ×2 IMPLANT
COVER WAND RF STERILE (DRAPES) ×2 IMPLANT
CUFF TOURN SGL QUICK 30 (TOURNIQUET CUFF) ×2
CUFF TRNQT CYL 30X4X21-28X (TOURNIQUET CUFF) IMPLANT
DRAPE 3/4 80X56 (DRAPES) ×2 IMPLANT
DRSG DERMACEA 8X12 NADH (GAUZE/BANDAGES/DRESSINGS) ×2 IMPLANT
DRSG MEPILEX SACRM 8.7X9.8 (GAUZE/BANDAGES/DRESSINGS) ×2 IMPLANT
DRSG OPSITE POSTOP 4X14 (GAUZE/BANDAGES/DRESSINGS) ×3 IMPLANT
DRSG TEGADERM 4X4.75 (GAUZE/BANDAGES/DRESSINGS) ×2 IMPLANT
DURAPREP 26ML APPLICATOR (WOUND CARE) ×4 IMPLANT
ELECT REM PT RETURN 9FT ADLT (ELECTROSURGICAL) ×2
ELECTRODE REM PT RTRN 9FT ADLT (ELECTROSURGICAL) ×1 IMPLANT
EX-PIN ORTHOLOCK NAV 4X150 (PIN) ×4 IMPLANT
GLOVE BIO SURGEON STRL SZ7.5 (GLOVE) ×4 IMPLANT
GLOVE BIOGEL M STRL SZ7.5 (GLOVE) ×4 IMPLANT
GLOVE BIOGEL PI IND STRL 7.5 (GLOVE) ×1 IMPLANT
GLOVE BIOGEL PI INDICATOR 7.5 (GLOVE) ×1
GLOVE INDICATOR 8.0 STRL GRN (GLOVE) ×2 IMPLANT
GOWN STRL REUS W/ TWL LRG LVL3 (GOWN DISPOSABLE) ×2 IMPLANT
GOWN STRL REUS W/ TWL XL LVL3 (GOWN DISPOSABLE) ×1 IMPLANT
GOWN STRL REUS W/TWL LRG LVL3 (GOWN DISPOSABLE) ×4
GOWN STRL REUS W/TWL XL LVL3 (GOWN DISPOSABLE) ×2
HEMOVAC 400CC 10FR (MISCELLANEOUS) ×2 IMPLANT
HOLDER FOLEY CATH W/STRAP (MISCELLANEOUS) ×2 IMPLANT
HOOD PEEL AWAY FLYTE STAYCOOL (MISCELLANEOUS) ×4 IMPLANT
IRRIGATION SURGIPHOR STRL (IV SOLUTION) ×2 IMPLANT
KIT PREVENA INCISION MGT20CM45 (CANNISTER) ×1 IMPLANT
KIT TURNOVER KIT A (KITS) ×2 IMPLANT
KNIFE SCULPS 14X20 (INSTRUMENTS) ×2 IMPLANT
LABEL OR SOLS (LABEL) ×2 IMPLANT
MANIFOLD NEPTUNE II (INSTRUMENTS) ×3 IMPLANT
NDL SAFETY ECLIPSE 18X1.5 (NEEDLE) ×1 IMPLANT
NDL SPNL 20GX3.5 QUINCKE YW (NEEDLE) ×2 IMPLANT
NEEDLE HYPO 18GX1.5 SHARP (NEEDLE) ×2
NEEDLE SPNL 20GX3.5 QUINCKE YW (NEEDLE) ×4 IMPLANT
NS IRRIG 500ML POUR BTL (IV SOLUTION) ×2 IMPLANT
PACK TOTAL KNEE (MISCELLANEOUS) ×2 IMPLANT
PAD WRAPON POLAR KNEE (MISCELLANEOUS) ×1 IMPLANT
PENCIL SMOKE EVACUATOR COATED (MISCELLANEOUS) ×2 IMPLANT
PENCIL SMOKE ULTRAEVAC 22 CON (MISCELLANEOUS) ×2 IMPLANT
PIN DRILL QUICK PACK ×2 IMPLANT
PIN FIXATION 1/8DIA X 3INL (PIN) ×6 IMPLANT
PREVENA INCISION MGT 90 150 (MISCELLANEOUS) IMPLANT
PULSAVAC PLUS IRRIG FAN TIP (DISPOSABLE) ×2
SOL .9 NS 3000ML IRR  AL (IV SOLUTION) ×1
SOL .9 NS 3000ML IRR AL (IV SOLUTION) ×1
SOL .9 NS 3000ML IRR UROMATIC (IV SOLUTION) ×1 IMPLANT
SOL PREP PVP 2OZ (MISCELLANEOUS) ×2
SOLUTION PREP PVP 2OZ (MISCELLANEOUS) ×1 IMPLANT
SPONGE DRAIN TRACH 4X4 STRL 2S (GAUZE/BANDAGES/DRESSINGS) ×2 IMPLANT
STAPLER SKIN PROX 35W (STAPLE) ×2 IMPLANT
STOCKINETTE IMPERV 14X48 (MISCELLANEOUS) ×1 IMPLANT
STRAP TIBIA SHORT (MISCELLANEOUS) ×2 IMPLANT
SUCTION FRAZIER HANDLE 10FR (MISCELLANEOUS) ×1
SUCTION TUBE FRAZIER 10FR DISP (MISCELLANEOUS) ×1 IMPLANT
SUT VIC AB 0 CT1 36 (SUTURE) ×4 IMPLANT
SUT VIC AB 1 CT1 36 (SUTURE) ×4 IMPLANT
SUT VIC AB 2-0 CT2 27 (SUTURE) ×2 IMPLANT
SYR 20ML LL LF (SYRINGE) ×2 IMPLANT
SYR 30ML LL (SYRINGE) ×4 IMPLANT
TIBIAL BASE ROT PLAT SZ 8 KNEE (Knees) ×2 IMPLANT
TIP FAN IRRIG PULSAVAC PLUS (DISPOSABLE) ×1 IMPLANT
TOWEL OR 17X26 4PK STRL BLUE (TOWEL DISPOSABLE) ×2 IMPLANT
TOWER CARTRIDGE SMART MIX (DISPOSABLE) ×2 IMPLANT
TRAY FOLEY MTR SLVR 16FR STAT (SET/KITS/TRAYS/PACK) ×2 IMPLANT
WRAPON POLAR PAD KNEE (MISCELLANEOUS) ×2

## 2020-01-02 NOTE — Op Note (Signed)
OPERATIVE NOTE  DATE OF SURGERY:  01/02/2020  PATIENT NAME:  Hiroshi Krummel   DOB: 05-24-1950  MRN: [id number]  PRE-OPERATIVE DIAGNOSIS: Degenerative arthrosis of the right knee, primary  POST-OPERATIVE DIAGNOSIS:  Same  PROCEDURE:  Right total knee arthroplasty using computer-assisted navigation  SURGEON:  Jena Gauss. M.D.  ASSISTANT: Baldwin Jamaica, PA-C (present and scrubbed throughout the case, critical for assistance with exposure, retraction, instrumentation, and closure)  ANESTHESIA: spinal  ESTIMATED BLOOD LOSS: 50 mL  FLUIDS REPLACED: 1200 mL of crystalloid  TOURNIQUET TIME: 92 minutes  DRAINS: Wound VAC  SOFT TISSUE RELEASES: Anterior cruciate ligament, posterior cruciate ligament, deep medial collateral ligament, patellofemoral ligament  IMPLANTS UTILIZED: DePuy Attune size 6 posterior stabilized femoral component (cemented), size 8 rotating platform tibial component (cemented), 41 mm medialized dome patella (cemented), and a 6 mm stabilized rotating platform polyethylene insert.  INDICATIONS FOR SURGERY: Amarie Tarte is a 69 y.o. year old male with a long history of progressive knee pain. X-rays demonstrated severe degenerative changes in tricompartmental fashion. The patient had not seen any significant improvement despite conservative nonsurgical intervention. After discussion of the risks and benefits of surgical intervention, the patient expressed understanding of the risks benefits and agree with plans for total knee arthroplasty.   The risks, benefits, and alternatives were discussed at length including but not limited to the risks of infection, bleeding, nerve injury, stiffness, blood clots, the need for revision surgery, cardiopulmonary complications, among others, and they were willing to proceed.  PROCEDURE IN DETAIL: The patient was brought into the operating room and, after adequate spinal anesthesia was achieved, a tourniquet was placed on the patient's upper  thigh. The patient's knee and leg were cleaned and prepped with alcohol and DuraPrep and draped in the usual sterile fashion. A "timeout" was performed as per usual protocol. The lower extremity was exsanguinated using an Esmarch, and the tourniquet was inflated to 300 mmHg. An anterior longitudinal incision was made followed by a standard mid vastus approach. The deep fibers of the medial collateral ligament were elevated in a subperiosteal fashion off of the medial flare of the tibia so as to maintain a continuous soft tissue sleeve. The patella was subluxed laterally and the patellofemoral ligament was incised. Inspection of the knee demonstrated severe degenerative changes with full-thickness loss of articular cartilage. Osteophytes were debrided using a rongeur. Anterior and posterior cruciate ligaments were excised. Two 4.0 mm Schanz pins were inserted in the femur and into the tibia for attachment of the array of trackers used for computer-assisted navigation. Hip center was identified using a circumduction technique. Distal landmarks were mapped using the computer. The distal femur and proximal tibia were mapped using the computer. The distal femoral cutting guide was positioned using computer-assisted navigation so as to achieve a 5 distal valgus cut. The femur was sized and it was felt that a size 6 femoral component was appropriate. A size 6 femoral cutting guide was positioned and the anterior cut was performed and verified using the computer. This was followed by completion of the posterior and chamfer cuts. Femoral cutting guide for the central box was then positioned in the center box cut was performed.  Attention was then directed to the proximal tibia. Medial and lateral menisci were excised. The extramedullary tibial cutting guide was positioned using computer-assisted navigation so as to achieve a 0 varus-valgus alignment and 3 posterior slope. The cut was performed and verified using the  computer. The proximal tibia was sized and it was  felt that a size 8 tibial tray was appropriate. Tibial and femoral trials were inserted followed by insertion of a 6 mm polyethylene insert. This allowed for excellent mediolateral soft tissue balancing both in flexion and in full extension. Finally, the patella was cut and prepared so as to accommodate a 41 mm medialized dome patella. A patella trial was placed and the knee was placed through a range of motion with excellent patellar tracking appreciated. The femoral trial was removed after debridement of posterior osteophytes. The central post-hole for the tibial component was reamed followed by insertion of a keel punch. Tibial trials were then removed. Cut surfaces of bone were irrigated with copious amounts of normal saline using pulsatile lavage and then suctioned dry. Polymethylmethacrylate cement with gentamicin was prepared in the usual fashion using a vacuum mixer. Cement was applied to the cut surface of the proximal tibia as well as along the undersurface of a size 8 rotating platform tibial component. Tibial component was positioned and impacted into place. Excess cement was removed using Personal assistant. Cement was then applied to the cut surfaces of the femur as well as along the posterior flanges of the size 6 femoral component. The femoral component was positioned and impacted into place. Excess cement was removed using Personal assistant. A 6 mm polyethylene trial was inserted and the knee was brought into full extension with steady axial compression applied. Finally, cement was applied to the backside of a 41 mm medialized dome patella and the patellar component was positioned and patellar clamp applied. Excess cement was removed using Personal assistant. After adequate curing of the cement, the tourniquet was deflated after a total tourniquet time of 92 minutes. Hemostasis was achieved using electrocautery. The knee was irrigated with copious amounts of  normal saline using pulsatile lavage followed by 500 ml of Surgiphor and then suctioned dry. 20 mL of 1.3% Exparel and 60 mL of 0.25% Marcaine in 40 mL of normal saline was injected along the posterior capsule, medial and lateral gutters, and along the arthrotomy site. A 6 mm stabilized rotating platform polyethylene insert was inserted and the knee was placed through a range of motion with excellent mediolateral soft tissue balancing appreciated and excellent patellar tracking noted. 2 medium drains were placed in the wound bed and brought out through separate stab incisions. The medial parapatellar portion of the incision was reapproximated using interrupted sutures of #1 Vicryl. Subcutaneous tissue was approximated in layers using first #0 Vicryl followed #2-0 Vicryl. The skin was approximated with skin staples. A sterile dressing was applied.  The patient tolerated the procedure well and was transported to the recovery room in stable condition.    Ramonita Koenig P. Angie Fava., M.D.

## 2020-01-02 NOTE — Anesthesia Procedure Notes (Signed)
Spinal  Patient location during procedure: OR Start time: 01/02/2020 7:20 AM End time: 01/02/2020 7:24 AM Staffing Performed: resident/CRNA  Anesthesiologist: Alvin Critchley, MD Resident/CRNA: Caryl Asp, CRNA Preanesthetic Checklist Completed: patient identified, IV checked, site marked, risks and benefits discussed, surgical consent, monitors and equipment checked, pre-op evaluation and timeout performed Spinal Block Patient position: sitting Prep: Betadine Patient monitoring: heart rate, continuous pulse ox, blood pressure and cardiac monitor Approach: midline Location: L4-5 Injection technique: single-shot Needle Needle type: Whitacre and Introducer  Needle gauge: 24 G Needle length: 9 cm Additional Notes Negative paresthesia. Negative blood return. Positive free-flowing CSF. Expiration date of kit checked and confirmed. Patient tolerated procedure well, without complications.

## 2020-01-02 NOTE — H&P (Signed)
The patient has been re-examined, and the chart reviewed, and there have been no interval changes to the documented history and physical.    The risks, benefits, and alternatives have been discussed at length. The patient expressed understanding of the risks benefits and agreed with plans for surgical intervention.  Raimi Guillermo P. Edrei Norgaard, Jr. M.D.    

## 2020-01-02 NOTE — Discharge Instructions (Signed)
Instructions after Total Knee Replacement   James P. Hooten, Jr., M.D.     Dept. of Orthopaedics & Sports Medicine  [facility]  [address]  [place], [place]  [address]  Phone: [phone]   Fax: [phone]    DIET: . Drink plenty of non-alcoholic fluids. . Resume your normal diet. Include foods high in fiber.  ACTIVITY:  . You may use crutches or a walker with weight-bearing as tolerated, unless instructed otherwise. . You may be weaned off of the walker or crutches by your Physical Therapist.  . Do NOT place pillows under the knee. Anything placed under the knee could limit your ability to straighten the knee.   . Continue doing gentle exercises. Exercising will reduce the pain and swelling, increase motion, and prevent muscle weakness.   . Please continue to use the TED compression stockings for 6 weeks. You may remove the stockings at night, but should reapply them in the morning. . Do not drive or operate any equipment until instructed.  WOUND CARE:  . Continue to use the PolarCare or ice packs periodically to reduce pain and swelling. . You may bathe or shower after the staples are removed at the first office visit following surgery.  MEDICATIONS: . You may resume your regular medications. . Please take the pain medication as prescribed on the medication. . Do not take pain medication on an empty stomach. . You have been given a prescription for a blood thinner (Lovenox or Coumadin). Please take the medication as instructed. (NOTE: After completing a 2 week course of Lovenox, take one Enteric-coated aspirin once a day. This along with elevation will help reduce the possibility of phlebitis in your operated leg.) . Do not drive or drink alcoholic beverages when taking pain medications.  CALL THE OFFICE FOR: . Temperature above 101 degrees . Excessive bleeding or drainage on the dressing. . Excessive swelling, coldness, or paleness of the toes. . Persistent  nausea and vomiting.  FOLLOW-UP:  . You should have an appointment to return to the office in 10-14 days after surgery. . Arrangements have been made for continuation of Physical Therapy (either home therapy or outpatient therapy).     [facility] Department Directory         [email or web address]       [email or web address]          Cardiology  Appointments: [place] - [phone] [place] - [phone]  Endocrinology  Appointments: [place] - [phone] [place] - [phone]  Gastroenterology  Appointments: [place] - [phone] [place] - [phone]        General Surgery   Appointments: [place] - [phone]  Internal Medicine/Family Medicine  Appointments: [place] - [phone] [place] - [phone] [place] - [phone]  Metabolic and Weigh Loss Surgery  Appointments: [place] - [phone]        Neurology  Appointments: [place] - [phone] [place] - [phone]  Neurosurgery  Appointments: [place] - [phone]  Obstetrics & Gynecology  Appointments: [place] - [phone] [place] - [phone]        Pediatrics  Appointments: [place] - [phone] [place] - [phone]  Physiatry  Appointments: [place] -[phone]  Physical Therapy  Appointments: [place] - [phone] [place] - [phone]        Podiatry  Appointments: [place] - [phone] [place] - [phone]  Pulmonology  Appointments: [place] - [phone]  Rheumatology  Appointments: [place] - [phone]         Location: [facility]  [address] , [place]  [address]  [place] Location: [facility] [address] [place], [place]  [address]  [place] Location: [facility] [address] [place], [place]  [address]      AMBULATORY SURGERY  DISCHARGE INSTRUCTIONS   1) The drugs that you were given will stay in your system until tomorrow so for  the next 24 hours you should not:  A) Drive an automobile B) Make any legal decisions C) Drink any alcoholic beverage   2) You may resume regular meals tomorrow.  Today it is better to start with liquids and gradually work up to solid foods.  You may eat anything you prefer, but it is better to start with liquids, then soup and crackers, and gradually work up to solid foods.   3) Please notify your doctor immediately if you have any unusual bleeding, trouble breathing, redness and pain at the surgery site, drainage, fever, or pain not relieved by medication.    4) Additional Instructions:        Please contact your physician with any problems or Same Day Surgery at [phone], Monday through Friday 6 am to 4 pm, or [place] [facility] [place] Main number at [phone]. 

## 2020-01-02 NOTE — Evaluation (Signed)
Occupational Therapy Evaluation Patient Details Name: Kristopher Oneal MRN: [id number] DOB: 14-Oct-1950 Today's Date: 01/02/2020    History of Present Illness Kristopher Oneal is a 69yoM who comes [facility] on 9/13 for elective Rt TKA.   Clinical Impression   Pt seen for OT evaluation this date, POD#0 from above surgery. Pt was independent in all ADLs prior to surgery and is eager to return to PLOF with less pain and improved safety and independence. Pt currently requires PRN minimal assist for LB dressing while in seated position due to pain and limited AROM of R knee. Pt/spouse instructed in polar care mgt, falls prevention strategies, home/routines modifications, DME/AE for LB bathing and dressing tasks, and compression stocking mgt. Handout provided to maximize recall and carryover.  Do not currently anticipate any additional OT needs following this hospitalization. Will sign off.     Follow Up Recommendations  No OT follow up    Equipment Recommendations  None recommended by OT    Recommendations for Other Services       Precautions / Restrictions Precautions Precautions: Knee;Fall Restrictions Weight Bearing Restrictions: Yes RLE Weight Bearing: Weight bearing as tolerated      Mobility Bed Mobility Overal bed mobility: Modified Independent             General bed mobility comments: in recliner  Transfers Overall transfer level: Modified independent Equipment used: Rolling walker (2 wheeled)             General transfer comment: max effort taking advantage of momentum; tall patient    Balance Overall balance assessment: Modified Independent                                         ADL either performed or assessed with clinical judgement   ADL Overall ADL's : Needs assistance/impaired                                       General ADL Comments: PRN Min A for LB ADL, mod I for ADL transfers and ambulation with RW; spouse able to  provide needed level of assist for LB ADL including compression stockings and polar care mgt     Vision         Perception     Praxis      Pertinent Vitals/Pain Pain Assessment: No/denies pain     Hand Dominance     Extremity/Trunk Assessment Upper Extremity Assessment Upper Extremity Assessment: Overall WFL for tasks assessed   Lower Extremity Assessment Lower Extremity Assessment: RLE deficits/detail RLE Deficits / Details: post-op strength/ROM deficits expected       Communication     Cognition Arousal/Alertness: Awake/alert Behavior During Therapy: WFL for tasks assessed/performed Overall Cognitive Status: Within Functional Limits for tasks assessed                                     General Comments       Exercises Total Joint Exercises Ankle Circles/Pumps: AROM;Both;15 reps;Supine Quad Sets: AROM;Supine;Both;10 reps Short Arc Quad: AROM;Right;10 reps;Supine Heel Slides: AAROM;Both;15 reps;Supine Hip ABduction/ADduction: AAROM;Both;10 reps;Supine Straight Leg Raises: AROM;Both;10 reps;Supine Long Arc Quad: AROM;5 reps;Seated;Supine Knee Flexion: AAROM;Seated;5 reps;Supine Goniometric ROM: 8-85 degrees Right knee flexion P/ROM Other Exercises Other Exercises:  pt/spouse instructed in polar care mgt, compression stocking mgt, falls prevention, pet care considerations, home/routines modifications, AE/DME; handout provided   Shoulder Instructions      Home Living Family/patient expects to be discharged to:: Other (Comment) [facility]) Living Arrangements: Spouse/significant other Available Help at Discharge: Family Type of Home:  (5th wheel RV [facility])) Home Access: Stairs to enter Secretary/administrator of Steps: 3 Entrance Stairs-Rails: Left Home Layout: Multi-level Alternate Level Stairs-Number of Steps: 3 to Metallurgist             Home Equipment: Gilmer Mor - single point;Walker - 2 wheels;Bedside commode          Prior  Functioning/Environment Level of Independence: Independent with assistive device(s)                 OT Problem List: Decreased strength;Decreased range of motion      OT Treatment/Interventions:      OT Goals(Current goals can be found in the care plan section) Acute Rehab OT Goals Patient Stated Goal: go home OT Goal Formulation: All assessment and education complete, DC therapy  OT Frequency:     Barriers to D/C:            Co-evaluation              AM-PAC OT "6 Clicks" Daily Activity     Outcome Measure Help from another person eating meals?: None Help from another person taking care of personal grooming?: None Help from another person toileting, which includes using toliet, bedpan, or urinal?: None Help from another person bathing (including washing, rinsing, drying)?: A Little Help from another person to put on and taking off regular upper body clothing?: None Help from another person to put on and taking off regular lower body clothing?: A Little 6 Click Score: 22   End of Session Nurse Communication: Mobility status  Activity Tolerance: Patient tolerated treatment well Patient left: in chair;with call bell/phone within reach;with nursing/sitter in room;with family/visitor present  OT Visit Diagnosis: Other abnormalities of gait and mobility (R26.89)                Time: 1062-6948 OT Time Calculation (min): 12 min Charges:  OT General Charges $OT Visit: 1 Visit OT Evaluation $OT Eval Low Complexity: 1 Low OT Treatments $Self Care/Home Management : 8-22 mins  Richrd Prime, MPH, MS, OTR/L ascom [phone] 01/02/20, 4:48 PM

## 2020-01-02 NOTE — Evaluation (Signed)
Physical Therapy Evaluation Patient Details Name: Kristopher Oneal MRN: [id number] DOB: 1951/04/17 Today's Date: 01/02/2020   History of Present Illness  Kristopher Oneal is a 63yoM who comes to [facility] on 9/13 for elective Rt TKA.  Clinical Impression  Pt admitted with above diagnosis. Pt currently with functional limitations due to the deficits listed below (see "PT Problem List"). Upon entry, pt in bed, awake and agreeable to participate. The pt is alert and oriented x4, pleasant, conversational, and generally a good historian. Pt educated on HEP and precautions, RW use for transfers and AMB, gait training overground and stairs. Pt assisted to BR twice for numbers micturation and bowel movement. Pt requires maxEffort to rise to standing, but is able to perform without physical assist. Functional mobility assessment demonstrates increased effort/time requirements, fair tolerance, and need for physical assistance, whereas the patient performed these at a higher level of independence PTA. Patient is at baseline, all education completed, and time is given to address all questions/concerns. No additional skilled PT services needed at this time, PT signing off. PT recommends daily ambulation ad lib or with nursing staff as needed to prevent deconditioning.       Follow Up Recommendations Home health PT;Follow surgeon's recommendation for DC plan and follow-up therapies    Equipment Recommendations  None recommended by PT    Recommendations for Other Services       Precautions / Restrictions Precautions Precautions: Knee;Fall Restrictions Weight Bearing Restrictions: Yes RLE Weight Bearing: Weight bearing as tolerated      Mobility  Bed Mobility Overal bed mobility: Modified Independent             General bed mobility comments: no asssit needed  Transfers Overall transfer level: Modified independent Equipment used: Rolling walker (2 wheeled)             General transfer comment: max  effort taking advantage of momentum; tall patient  Ambulation/Gait   Gait Distance (Feet): 150 Feet Assistive device: Rolling walker (2 wheeled) Gait Pattern/deviations: Step-through pattern;WFL(Within Functional Limits);Antalgic     General Gait Details: 2-point RW gait  Stairs Stairs: Yes Stairs assistance: Min guard Stair Management: Two rails Number of Stairs: 4 General stair comments: cued sequencing is similar to antalgic patterns prior to surgery  Wheelchair Mobility    Modified Rankin (Stroke Patients Only)       Balance Overall balance assessment: Modified Independent                                           Pertinent Vitals/Pain Pain Assessment: No/denies pain    Home Living Family/patient expects to be discharged to:: Other (Comment) [facility]) Living Arrangements: Spouse/significant other Available Help at Discharge: Family Type of Home:  (5th wheel RV [facility])) Home Access: Stairs to enter Entrance Stairs-Rails: Left Entrance Stairs-Number of Steps: 3 Home Layout: Multi-level Home Equipment: Cane - single point;Walker - 2 wheels;Bedside commode      Prior Function Level of Independence: Independent with assistive device(s)               Hand Dominance        Extremity/Trunk Assessment   Upper Extremity Assessment Upper Extremity Assessment: Overall WFL for tasks assessed    Lower Extremity Assessment Lower Extremity Assessment: Overall WFL for tasks assessed       Communication      Cognition Arousal/Alertness: Awake/alert Behavior During Therapy: [facility]  for tasks assessed/performed Overall Cognitive Status: Within Functional Limits for tasks assessed                                        General Comments      Exercises Total Joint Exercises Ankle Circles/Pumps: AROM;Both;15 reps;Supine Quad Sets: AROM;Supine;Both;10 reps Short Arc Quad: AROM;Right;10 reps;Supine Heel Slides: AAROM;Both;15  reps;Supine Hip ABduction/ADduction: AAROM;Both;10 reps;Supine Straight Leg Raises: AROM;Both;10 reps;Supine Long Arc Quad: AROM;5 reps;Seated;Supine Knee Flexion: AAROM;Seated;5 reps;Supine Goniometric ROM: 8-85 degrees Right knee flexion P/ROM   Assessment/Plan    PT Assessment All further PT needs can be met in the next venue of care  PT Problem List Decreased strength;Decreased range of motion;Decreased activity tolerance;Decreased balance;Decreased mobility;Decreased knowledge of use of DME;Decreased safety awareness       PT Treatment Interventions      PT Goals (Current goals can be found in the Care Plan section)  Acute Rehab PT Goals PT Goal Formulation: Patient unable to participate in goal setting    Frequency     Barriers to discharge        Co-evaluation               AM-PAC PT "6 Clicks" Mobility  Outcome Measure Help needed turning from your back to your side while in a flat bed without using bedrails?: A Little Help needed moving from lying on your back to sitting on the side of a flat bed without using bedrails?: A Little Help needed moving to and from a bed to a chair (including a wheelchair)?: A Little Help needed standing up from a chair using your arms (e.g., wheelchair or bedside chair)?: A Little Help needed to walk in hospital room?: A Little Help needed climbing 3-5 steps with a railing? : A Little 6 Click Score: 18    End of Session Equipment Utilized During Treatment: Gait belt Activity Tolerance: Patient tolerated treatment well;No increased pain Patient left: in chair;with family/visitor present;with nursing/sitter in room;with call bell/phone within reach Nurse Communication: Mobility status PT Visit Diagnosis: Difficulty in walking, not elsewhere classified (R26.2);Muscle weakness (generalized) (M62.81);Other abnormalities of gait and mobility (R26.89)    Time: 0355-9741 PT Time Calculation (min) (ACUTE ONLY): 64 min   Charges:    PT Evaluation $PT Eval Low Complexity: 1 Low PT Treatments $Gait Training: 23-37 mins $Therapeutic Exercise: 8-22 mins        4:19 PM, 01/02/20 Etta Grandchild, PT, DPT Physical Therapist - [facility]  [phone] ([place])    [place] 01/02/2020, 4:19 PM

## 2020-01-02 NOTE — Anesthesia Preprocedure Evaluation (Signed)
Anesthesia Evaluation  Patient identified by MRN, date of birth, ID band Patient awake    Reviewed: Allergy & Precautions, NPO status , Patient's Chart, lab work & pertinent test results, reviewed documented beta blocker date and time   Airway Mallampati: II       Dental   Pulmonary former smoker,           Cardiovascular hypertension, Pt. on medications + dysrhythmias Atrial Fibrillation      Neuro/Psych negative neurological ROS  negative psych ROS   GI/Hepatic negative GI ROS, Neg liver ROS,   Endo/Other  diabetes  Renal/GU negative Renal ROS  negative genitourinary   Musculoskeletal  (+) Arthritis ,   Abdominal   Peds negative pediatric ROS (+)  Hematology negative hematology ROS (+)   Anesthesia Other Findings Past Medical History: No date: Arthritis No date: Diabetes mellitus without complication (HCC)     Comment:  type 2 No date: Dysrhythmia     Comment:  PVC No date: Hypertension  Reproductive/Obstetrics                             Anesthesia Physical Anesthesia Plan  ASA: III  Anesthesia Plan: Spinal   Post-op Pain Management:    Induction: Intravenous  PONV Risk Score and Plan:   Airway Management Planned: Nasal Cannula  Additional Equipment:   Intra-op Plan:   Post-operative Plan:   Informed Consent: I have reviewed the patients History and Physical, chart, labs and discussed the procedure including the risks, benefits and alternatives for the proposed anesthesia with the patient or authorized representative who has indicated his/her understanding and acceptance.     Dental advisory given  Plan Discussed with: Surgeon and CRNA  Anesthesia Plan Comments:         Anesthesia Quick Evaluation

## 2020-01-02 NOTE — Progress Notes (Addendum)
   01/02/20 0755  Clinical Encounter Type  Visited With Family  Visit Type Initial  Referral From Chaplain  Consult/Referral To Chaplain  While rounding in SDS waiting, chaplain spoke with pt's wife. Pt's wife said he was having knee surgery. She and her husband have been married 20 yrs.  Pt's wife brought tears to my eyes. 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 Everything is in God's hands. She said God gives peace. God will guide you if you let Him. She encouraged chaplain by saying ask God for calm. She also said that God is human and he understands how we feel. Pt's wife said it is not about having the answer, but about walking with God. She said that she has an empty book and God will help you fill it. Pt's wife said that speaking with chaplain will be in her book. She will reflect on the things written in her book (book sounds like her journal).

## 2020-01-02 NOTE — Transfer of Care (Signed)
Immediate Anesthesia Transfer of Care Note  Patient: Kristopher Oneal  Procedure(s) Performed: COMPUTER ASSISTED TOTAL KNEE ARTHROPLASTY (Right Knee)  Patient Location: PACU  Anesthesia Type:Spinal  Level of Consciousness: awake, alert  and oriented  Airway & Oxygen Therapy: Patient Spontanous Breathing and Patient connected to face mask oxygen  Post-op Assessment: Report given to RN and Post -op Vital signs reviewed and stable  Post vital signs: Reviewed and stable  Last Vitals:  Vitals Value Taken Time  BP 107/64 01/02/20 1107  Temp    Pulse 109 01/02/20 1110  Resp 17 01/02/20 1110  SpO2 97 % 01/02/20 1110  Vitals shown include unvalidated device data.  Last Pain:  Vitals:   01/02/20 0637  TempSrc: Tympanic  PainSc: 8          Complications: No complications documented.

## 2020-01-03 ENCOUNTER — Encounter: Payer: Self-pay | Admitting: Orthopedic Surgery

## 2020-01-05 NOTE — Anesthesia Postprocedure Evaluation (Signed)
Anesthesia Post Note  Patient: Kristopher Oneal  Procedure(s) Performed: COMPUTER ASSISTED TOTAL KNEE ARTHROPLASTY (Right Knee)  Patient location during evaluation: PACU Anesthesia Type: Spinal Level of consciousness: awake and alert and oriented Pain management: pain level controlled Vital Signs Assessment: post-procedure vital signs reviewed and stable Respiratory status: spontaneous breathing Cardiovascular status: blood pressure returned to baseline Anesthetic complications: no   No complications documented.   Last Vitals:  Vitals:   01/02/20 1346 01/02/20 1546  BP: 126/90 120/73  Pulse: 96 (S) (!) 101  Resp: 18 18  Temp:    SpO2:  96%    Last Pain:  Vitals:   01/02/20 1546  TempSrc:   PainSc: 0-No pain                 Loraine Bhullar

## 2020-01-18 DIAGNOSIS — Z96651 Presence of right artificial knee joint: Secondary | ICD-10-CM | POA: Insufficient documentation

## 2020-03-31 DIAGNOSIS — M1712 Unilateral primary osteoarthritis, left knee: Secondary | ICD-10-CM | POA: Insufficient documentation

## 2020-10-28 NOTE — Discharge Instructions (Signed)
Instructions after Total Knee Replacement   Roarke Marciano P. Ammon Muscatello, Jr., M.D.     Dept. of Orthopaedics & Sports Medicine  [facility]  [address]  [place], [place]  [address]  Phone: [phone]   Fax: [phone]    DIET: Drink plenty of non-alcoholic fluids. Resume your normal diet. Include foods high in fiber.  ACTIVITY:  You may use crutches or a walker with weight-bearing as tolerated, unless instructed otherwise. You may be weaned off of the walker or crutches by your Physical Therapist.  Do NOT place pillows under the knee. Anything placed under the knee could limit your ability to straighten the knee.   Continue doing gentle exercises. Exercising will reduce the pain and swelling, increase motion, and prevent muscle weakness.   Please continue to use the TED compression stockings for 6 weeks. You may remove the stockings at night, but should reapply them in the morning. Do not drive or operate any equipment until instructed.  WOUND CARE:  Continue to use the PolarCare or ice packs periodically to reduce pain and swelling. You may bathe or shower after the staples are removed at the first office visit following surgery.  MEDICATIONS: You may resume your regular medications. Please take the pain medication as prescribed on the medication. Do not take pain medication on an empty stomach. You have been given a prescription for a blood thinner (Lovenox or Coumadin). Please take the medication as instructed. (NOTE: After completing a 2 week course of Lovenox, take one Enteric-coated aspirin once a day. This along with elevation will help reduce the possibility of phlebitis in your operated leg.) Do not drive or drink alcoholic beverages when taking pain medications.  CALL THE OFFICE FOR: Temperature above 101 degrees Excessive bleeding or drainage on the dressing. Excessive swelling, coldness, or paleness of the toes. Persistent nausea and vomiting.  FOLLOW-UP:  You  should have an appointment to return to the office in 10-14 days after surgery. Arrangements have been made for continuation of Physical Therapy (either home therapy or outpatient therapy).   [facility] Department Directory         [email or web address]       [email or web address]          Cardiology  Appointments: [place] - [phone] [place] - [phone]  Endocrinology  Appointments: [place] - [phone] [place] - [phone]  Gastroenterology  Appointments: [place] - [phone] [place] - [phone]        General Surgery   Appointments: [place] - [phone]  Internal Medicine/Family Medicine  Appointments: [place] - [phone] [place] - [phone] [place] - [phone]  Metabolic and Weigh Loss Surgery  Appointments: [place] - [phone]        Neurology  Appointments: [place] - [phone] [place] - [phone]  Neurosurgery  Appointments: [place] - [phone]  Obstetrics & Gynecology  Appointments: [place] - [phone] [place] - [phone]        Pediatrics  Appointments: [place] - [phone] [place] - [phone]  Physiatry  Appointments: [place] -[phone]  Physical Therapy  Appointments: [place] - [phone] [place] - [phone]        Podiatry  Appointments: [place] - [phone] [place] - [phone]  Pulmonology  Appointments: [place] - [phone]  Rheumatology  Appointments: [place] - [phone]        [place] Location: [facility]  [address] [place], [place]  [address]  [place] Location: [facility] [address] [place], [place]  [address]  [place] Location: [facility] [address] [place], [place]  [address]    

## 2020-11-05 ENCOUNTER — Other Ambulatory Visit: Payer: Self-pay | Admitting: Otolaryngology

## 2020-11-05 DIAGNOSIS — H90A21 Sensorineural hearing loss, unilateral, right ear, with restricted hearing on the contralateral side: Secondary | ICD-10-CM

## 2020-11-08 ENCOUNTER — Other Ambulatory Visit
Admission: RE | Admit: 2020-11-08 | Discharge: 2020-11-08 | Disposition: A | Discharge status: Home / Self Care | Payer: Medicare Other | Source: Ambulatory Visit | Attending: Orthopedic Surgery | Admitting: Orthopedic Surgery

## 2020-11-08 ENCOUNTER — Other Ambulatory Visit: Payer: Self-pay

## 2020-11-08 DIAGNOSIS — Z01818 Encounter for other preprocedural examination: Secondary | ICD-10-CM | POA: Diagnosis not present

## 2020-11-08 HISTORY — DX: Anxiety disorder, unspecified: F41.9

## 2020-11-08 HISTORY — DX: Cardiac murmur, unspecified: R01.1

## 2020-11-08 LAB — CBC
HCT: 45.4 % (ref 39.0–52.0)
Hemoglobin: 15.3 g/dL (ref 13.0–17.0)
MCH: 31.9 pg (ref 26.0–34.0)
MCHC: 33.7 g/dL (ref 30.0–36.0)
MCV: 94.8 fL (ref 80.0–100.0)
Platelets: 263 10*3/uL (ref 150–400)
RBC: 4.79 MIL/uL (ref 4.22–5.81)
RDW: 13.2 % (ref 11.5–15.5)
WBC: 10.2 10*3/uL (ref 4.0–10.5)
nRBC: 0 % (ref 0.0–0.2)

## 2020-11-08 LAB — URINALYSIS, ROUTINE W REFLEX MICROSCOPIC
Bilirubin Urine: NEGATIVE
Glucose, UA: NEGATIVE mg/dL
Hgb urine dipstick: NEGATIVE
Ketones, ur: NEGATIVE mg/dL
Leukocytes,Ua: NEGATIVE
Nitrite: NEGATIVE
Protein, ur: NEGATIVE mg/dL
Specific Gravity, Urine: 1.017 (ref 1.005–1.030)
pH: 6 (ref 5.0–8.0)

## 2020-11-08 LAB — COMPREHENSIVE METABOLIC PANEL
ALT: 30 U/L (ref 0–44)
AST: 32 U/L (ref 15–41)
Albumin: 4.3 g/dL (ref 3.5–5.0)
Alkaline Phosphatase: 35 U/L — ABNORMAL LOW (ref 38–126)
Anion gap: 11 (ref 5–15)
BUN: 32 mg/dL — ABNORMAL HIGH (ref 8–23)
CO2: 23 mmol/L (ref 22–32)
Calcium: 9.5 mg/dL (ref 8.9–10.3)
Chloride: 104 mmol/L (ref 98–111)
Creatinine, Ser: 1.15 mg/dL (ref 0.61–1.24)
GFR, Estimated: >60 mL/min (ref >60)
Glucose, Bld: 113 mg/dL — ABNORMAL HIGH (ref 70–99)
Potassium: 3.7 mmol/L (ref 3.5–5.1)
Sodium: 138 mmol/L (ref 135–145)
Total Bilirubin: 0.9 mg/dL (ref 0.3–1.2)
Total Protein: 7.5 g/dL (ref 6.5–8.1)

## 2020-11-08 LAB — SEDIMENTATION RATE: Sed Rate: 9 mm/hr (ref 0–20)

## 2020-11-08 LAB — HEMOGLOBIN A1C
Hgb A1c MFr Bld: 8 % — ABNORMAL HIGH (ref 4.8–5.6)
Mean Plasma Glucose: 182.9 mg/dL

## 2020-11-08 LAB — PROTIME-INR
INR: 1 (ref 0.8–1.2)
Prothrombin Time: 13.4 seconds (ref 11.4–15.2)

## 2020-11-08 LAB — SURGICAL PCR SCREEN
MRSA, PCR: NEGATIVE
Staphylococcus aureus: POSITIVE — AB

## 2020-11-08 LAB — APTT: aPTT: 28 seconds (ref 24–36)

## 2020-11-08 LAB — C-REACTIVE PROTEIN: CRP: 0.5 mg/dL (ref <1.0)

## 2020-11-08 NOTE — Patient Instructions (Addendum)
Your procedure is scheduled on: 11/19/20 Monday Report to the Registration Desk on the 1st floor of the [facility]. To find out your arrival time, please call [phone] between 1PM - 3PM on: 11/16/20 Friday Report to [facility] for Covid Test 11/15/20 from 8am to 1200.  REMEMBER: Instructions that are not followed completely may result in serious medical risk, up to and including death; or upon the discretion of your surgeon and anesthesiologist your surgery may need to be rescheduled.  Do not eat food after midnight the night before surgery.  No gum chewing, lozengers or hard candies.  You may however, drink CLEAR liquids up to 2 hours before you are scheduled to arrive for your surgery. Do not drink anything within 2 hours of your scheduled arrival time. Type 1 and Type 2 diabetics should only drink water.  TAKE THESE MEDICATIONS THE MORNING OF SURGERY WITH A SIP OF WATER:  - venlafaxine XR (EFFEXOR-XR) 37.5 MG 24 hr capsule - fenofibrate 160 MG tablet  Stop Metformin  2 days prior to surgery. Do not take 07/30, 07/31 and do not take the day of surgery.  Take 1/2 of usual insulin dose the night before surgery and none on the morning of surgery.  Follow recommendations from Cardiologist, Pulmonologist or PCP regarding stopping Aspirin, Coumadin, Plavix, Eliquis, Pradaxa, or Pletal. Stop Aspirin 325 mg seven days before surgery.  One week prior to surgery: Stop Anti-inflammatories (NSAIDS) such as Advil, Aleve, Ibuprofen, Motrin, Naproxen, Naprosyn and Aspirin based products such as Excedrin, Goodys Powder, BC Powder.  Stop ANY OVER THE COUNTER supplements until after surgery.  You may however, continue to take Tylenol if needed for pain up until the day of surgery.  No Alcohol for 24 hours before or after surgery.  No Smoking including e-cigarettes for 24 hours prior to surgery.  No chewable tobacco products for at least 6 hours prior to surgery.  No nicotine patches on  the day of surgery.  Do not use any "recreational" drugs for at least a week prior to your surgery.  Please be advised that the combination of cocaine and anesthesia may have negative outcomes, up to and including death. If you test positive for cocaine, your surgery will be cancelled.  On the morning of surgery brush your teeth with toothpaste and water, you may rinse your mouth with mouthwash if you wish. Do not swallow any toothpaste or mouthwash.  Do not wear jewelry, make-up, hairpins, clips or nail polish.  Do not wear lotions, powders, or perfumes.   Do not shave body from the neck down 48 hours prior to surgery just in case you cut yourself which could leave a site for infection.  Also, freshly shaved skin may become irritated if using the CHG soap.  Contact lenses, hearing aids and dentures may not be worn into surgery.  Do not bring valuables to the hospital. [facility] is not responsible for any missing/lost belongings or valuables.   Use CHG Soap or wipes as directed on instruction sheet.  Notify your doctor if there is any change in your medical condition (cold, fever, infection).  Wear comfortable clothing (specific to your surgery type) to the hospital.  After surgery, you can help prevent lung complications by doing breathing exercises.  Take deep breaths and cough every 1-2 hours. Your doctor may order a device called an Incentive Spirometer to help you take deep breaths. When coughing or sneezing, hold a pillow firmly against your incision with both hands. This  is called "splinting." Doing this helps protect your incision. It also decreases belly discomfort.  If you are being admitted to the hospital overnight, leave your suitcase in the car. After surgery it may be brought to your room.  If you are being discharged the day of surgery, you will not be allowed to drive home. You will need a responsible adult (18 years or older) to drive you home and stay with you  that night.   If you are taking public transportation, you will need to have a responsible adult (18 years or older) with you. Please confirm with your physician that it is acceptable to use public transportation.   Please call the Pre-admissions Testing Dept. at [phone] if you have any questions about these instructions.  Surgery Visitation Policy:  Patients undergoing a surgery or procedure may have one family member or support person with them as long as that person is not COVID-19 positive or experiencing its symptoms.  That person may remain in the waiting area during the procedure.  Inpatient Visitation:    Visiting hours are 7 a.m. to 8 p.m. Inpatients will be allowed two visitors daily. The visitors may change each day during the patient's stay. No visitors under the age of 8. Any visitor under the age of 10 must be accompanied by an adult. The visitor must pass COVID-19 screenings, use hand sanitizer when entering and exiting the patient's room and wear a mask at all times, including in the patient's room. Patients must also wear a mask when staff or their visitor are in the room. Masking is required regardless of vaccination status.

## 2020-11-09 LAB — TYPE AND SCREEN
ABO/RH(D): A POS
Antibody Screen: POSITIVE

## 2020-11-09 LAB — URINE CULTURE
Culture: NO GROWTH
Special Requests: NORMAL

## 2020-11-12 ENCOUNTER — Other Ambulatory Visit: Payer: Self-pay

## 2020-11-12 ENCOUNTER — Ambulatory Visit
Admission: RE | Admit: 2020-11-12 | Discharge: 2020-11-12 | Disposition: A | Discharge status: Home / Self Care | Payer: Medicare Other | Source: Ambulatory Visit | Attending: Otolaryngology | Admitting: Otolaryngology

## 2020-11-12 DIAGNOSIS — H90A21 Sensorineural hearing loss, unilateral, right ear, with restricted hearing on the contralateral side: Secondary | ICD-10-CM | POA: Insufficient documentation

## 2020-11-12 MED ORDER — GADOBUTROL 1 MMOL/ML IV SOLN
10.0000 mL | Freq: Once | INTRAVENOUS | Status: AC | PRN
  Administered 2020-11-12: 10 mL via INTRAVENOUS

## 2020-11-15 ENCOUNTER — Other Ambulatory Visit
Admission: RE | Admit: 2020-11-15 | Discharge: 2020-11-15 | Disposition: A | Discharge status: Home / Self Care | Payer: Medicare Other | Source: Ambulatory Visit | Attending: Orthopedic Surgery | Admitting: Orthopedic Surgery

## 2020-11-15 NOTE — Pre-Procedure Instructions (Addendum)
Placed call to patient . Scheduled for  pre surgical COVID test today. He will come tomorrow 7.29.22. Hours 0800 -1200

## 2020-11-16 ENCOUNTER — Other Ambulatory Visit
Admission: RE | Admit: 2020-11-16 | Discharge: 2020-11-16 | Disposition: A | Discharge status: Home / Self Care | Payer: Medicare Other | Source: Ambulatory Visit | Attending: Orthopedic Surgery | Admitting: Orthopedic Surgery

## 2020-11-16 ENCOUNTER — Other Ambulatory Visit: Payer: Self-pay

## 2020-11-16 DIAGNOSIS — Z01812 Encounter for preprocedural laboratory examination: Secondary | ICD-10-CM | POA: Diagnosis present

## 2020-11-16 DIAGNOSIS — Z20822 Contact with and (suspected) exposure to covid-19: Secondary | ICD-10-CM | POA: Diagnosis not present

## 2020-11-16 LAB — SARS CORONAVIRUS 2 (TAT 6-24 HRS): SARS Coronavirus 2: NEGATIVE

## 2020-11-18 ENCOUNTER — Encounter: Payer: Self-pay | Admitting: Orthopedic Surgery

## 2020-11-18 NOTE — H&P (Signed)
ORTHOPAEDIC HISTORY & PHYSICAL [name], [place] - 11/08/2020 2:30 PM EDT Formatting of this note is different from the original. [place] [facility] Chief Complaint:   Chief Complaint  Patient presents with   Knee Pain  H & P LEFT KNEE   History of Present Illness:   Kristopher Oneal is a 70 y.o. male that presents to clinic today for his preoperative history and evaluation. Patient presents with his wife. The patient is scheduled to undergo a left total knee arthroplasty on 11/19/2020 by Dr. Marry Guan. His pain began several years ago. The pain is located primarily along the medial aspect of the knee. He describes his pain as worse with weightbearing and going up and down stairs. He reports associated swelling with some giving way of the knee. He denies associated numbness or tingling, denies locking.   The patient's symptoms have progressed to the point that they decrease his quality of life. The patient has previously undergone conservative treatment including NSAIDS and activity modification without adequate control of his symptoms.  Patient requests to have same-day surgery. His last A1C was 8.0 on 11/08/20.  No known drug allergies. He denies significant cardiac history. Denies history of blood clots. Has history of partial discectomy of L5-S1.   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   Past Surgical History:  Past Surgical History:  Procedure Laterality Date   Right total knee arthroplasty using computer-assisted navigation  01/02/2020  Dr Marry Guan   JOINT REPLACEMENT Right 01/02/2020   LAMINECTOMY LUMBAR SPINE 1993   REPLACEMENT TOTAL KNEE Right 01/02/2020   Current Medications:  Current Outpatient Medications  Medication Sig Dispense Refill   ascorbic acid, vitamin C, (VITAMIN C) 1000 MG tablet Take 1,000 mg by mouth once daily Per patient   aspirin 325 MG EC tablet Take 325 mg by mouth once daily   bumetanide (BUMEX) 1 MG tablet Take 1 tablet (1 mg total) by mouth once daily 90 tablet 3   cyanocobalamin/cobamamide (B12 SL) Place 1 tablet under the tongue once daily 1000 mcg one daily   diltiazem (CARDIZEM CD) 240 MG CD capsule TAKE 1 CAPSULE BY MOUTH IN THE EVENING 90 capsule 3   fenofibrate 160 MG tablet TAKE 1 TABLET BY MOUTH ONCE DAILY (Patient taking differently: Take 160 mg by mouth once daily) 90 tablet 3   flash glucose sensor (FREESTYLE LIBRE 14 DAY SENSOR) kit Use 1 kit every 14 (fourteen) days 2 kit 12   fluticasone propionate (FLONASE) 50 mcg/actuation nasal spray Place into one nostril   gabapentin (NEURONTIN) 300 MG capsule TAKE 3 CAPSULES BY MOUTH AT NIGHT (Patient taking differently: Take 300 mg by mouth nightly) 270 capsule 3   insulin NPH-REGULAR (HUMULIN 70/30 PEN) 100 unit/mL (70-30) pen injector Inject 35 Units subcutaneously nightly Novolin 70/30, 45 units in the morning before breakfast and 35 units   lovastatin (MEVACOR) 40 MG tablet TAKE 1 TABLET BY MOUTH DAILY WITH DINNER (Patient taking differently: Take 40 mg by mouth daily with dinner) 90 tablet 3   metFORMIN (GLUCOPHAGE) 1000 MG tablet Take 1 tablet (1,000 mg total) by mouth 2 (two) times  daily Take 1 tablet by mouth (1000 mg total) twice daily 180 tablet 3   metoprolol succinate (TOPROL-XL) 100 MG XL tablet TAKE 1 TABLET BY MOUTH ONCE DAILY (Patient taking differently: Take 100 mg by mouth once daily) 90 tablet 3   multivitamin tablet Take 1 tablet by mouth once daily For diabetics   pen needle, diabetic 32 gauge x 5/32" Ndle Use 1  each 2 (two) times daily 100 each 4   venlafaxine (EFFEXOR-XR) 37.5 MG XR capsule Take 1 capsule (37.5 mg total) by mouth once daily 90 capsule 3   glipiZIDE (GLUCOTROL) 5 MG tablet Take 1 tablet (5 mg total) by mouth 2 (two) times daily for 90 days 180 tablet 3   No current facility-administered medications for this visit.   Allergies: No Known Allergies  Social History:  Social History   Socioeconomic History   Marital status: Married  Spouse name: Helene Kelp   Number of children: 1   Years of education: 60  Occupational History   Occupation: Animator- Teaching laboratory technician  Tobacco Use   Smoking status: Former Smoker  Types: Cigarettes  Quit date: 1980  Years since quitting: 42.5   Smokeless tobacco: Never Used  Scientific laboratory technician Use: Never used  Substance and Sexual Activity   Alcohol use: No   Drug use: Never   Sexual activity: Defer  Partners: Female   Family History:  Family History  Problem Relation Age of Onset   Alzheimer's disease Mother   No Known Problems Father   Review of Systems:   A 10+ ROS was performed, reviewed, and the pertinent orthopaedic findings are documented in the HPI.   Physical Examination:   BP 126/86 (BP Location: Left upper arm, Patient Position: Sitting, BP Cuff Size: Large Adult)  Ht 188 cm (_0 )  Wt (!) 125.4 kg (276 lb 6.4 oz)  BMI 35.49 kg/m   Patient is a well-developed, well-nourished male in no acute distress. Patient has normal mood and affect. Patient is alert and oriented to person, place, and time.   HEENT: Atraumatic, normocephalic. Pupils equal and reactive to light. Extraocular motion intact. Noninjected sclera.  Cardiovascular: Irregular rate and rhythm, with no murmurs, rubs, or gallops. Distal pulses palpable.  Respiratory: Lungs clear to auscultation bilaterally.   Left Knee: Soft tissue swelling: minimal Effusion: none Erythema: none Crepitance: mild Tenderness: medial Alignment: relative varus Mediolateral  laxity: medial pseudolaxity Posterior sag: negative Patellar tracking: Good tracking without evidence of subluxation or tilt Atrophy: No significant atrophy.  Quadriceps tone was good. Range of motion: 0/0/120 degrees  Sensation intact over the saphenous, lateral sural cutaneous, superficial fibular, and deep fibular nerve distributions.  Tests Performed/Reviewed:  X-rays  X-ray knee left 3 views  Result Date: 11/08/2020 3 views of the left knee were obtained. Images reveal severe loss of medial compartment joint space with osteophyte formation. No fractures or dislocations. No other osseous abnormality noted   Impression:   ICD-10-CM  1. Primary osteoarthritis of left knee M17.12   Plan:   The patient has end-stage degenerative changes of the left knee. It was explained to the patient that the condition is progressive in nature. Having failed conservative treatment, the patient has elected to proceed with a total joint arthroplasty. The patient will undergo a total joint arthroplasty with Dr. Marry Guan. The risks of surgery, including blood clot and infection, were discussed with the patient. Measures to reduce these risks, including the use of anticoagulation, perioperative antibiotics, and early ambulation were discussed. The  importance of postoperative physical therapy was discussed with the patient. The patient elects to proceed with surgery. The patient is instructed to stop all blood thinners prior to surgery. The patient is instructed to call the hospital the day before surgery to learn of the proper arrival time.   Contact our office with any questions or concerns. Follow up as indicated, or sooner should any new problems arise, if conditions worsen, or if they are otherwise concerned.   [name], [facility] [place], [place] [address] Phone: [phone]  This note was generated in part with voice  recognition software and I apologize for any typographical errors that were not detected and corrected.  Electronically signed by [name], PA at 11/15/2020 1:40 PM EDT

## 2020-11-19 ENCOUNTER — Observation Stay
Admission: RE | Admit: 2020-11-19 | Discharge: 2020-11-20 | Disposition: A | Discharge status: Home / Self Care | Payer: Medicare Other | Attending: Orthopedic Surgery | Admitting: Orthopedic Surgery

## 2020-11-19 ENCOUNTER — Other Ambulatory Visit: Payer: Self-pay

## 2020-11-19 ENCOUNTER — Encounter
Admission: RE | Disposition: A | Discharge status: Home / Self Care | Payer: Self-pay | Source: Home / Self Care | Attending: Orthopedic Surgery

## 2020-11-19 ENCOUNTER — Encounter: Payer: Self-pay | Admitting: Orthopedic Surgery

## 2020-11-19 ENCOUNTER — Ambulatory Visit: Payer: Medicare Other | Admitting: Anesthesiology

## 2020-11-19 ENCOUNTER — Observation Stay: Payer: Medicare Other

## 2020-11-19 ENCOUNTER — Ambulatory Visit: Payer: Medicare Other | Admitting: Urgent Care

## 2020-11-19 DIAGNOSIS — I10 Essential (primary) hypertension: Secondary | ICD-10-CM | POA: Insufficient documentation

## 2020-11-19 DIAGNOSIS — Z87891 Personal history of nicotine dependence: Secondary | ICD-10-CM | POA: Insufficient documentation

## 2020-11-19 DIAGNOSIS — Z7984 Long term (current) use of oral hypoglycemic drugs: Secondary | ICD-10-CM | POA: Diagnosis not present

## 2020-11-19 DIAGNOSIS — Z96651 Presence of right artificial knee joint: Secondary | ICD-10-CM | POA: Insufficient documentation

## 2020-11-19 DIAGNOSIS — Z96659 Presence of unspecified artificial knee joint: Secondary | ICD-10-CM

## 2020-11-19 DIAGNOSIS — Z96652 Presence of left artificial knee joint: Secondary | ICD-10-CM

## 2020-11-19 DIAGNOSIS — I482 Chronic atrial fibrillation, unspecified: Secondary | ICD-10-CM | POA: Insufficient documentation

## 2020-11-19 DIAGNOSIS — E119 Type 2 diabetes mellitus without complications: Secondary | ICD-10-CM | POA: Diagnosis not present

## 2020-11-19 DIAGNOSIS — Z7982 Long term (current) use of aspirin: Secondary | ICD-10-CM | POA: Insufficient documentation

## 2020-11-19 DIAGNOSIS — M1712 Unilateral primary osteoarthritis, left knee: Principal | ICD-10-CM | POA: Insufficient documentation

## 2020-11-19 DIAGNOSIS — Z794 Long term (current) use of insulin: Secondary | ICD-10-CM | POA: Diagnosis not present

## 2020-11-19 HISTORY — PX: KNEE ARTHROPLASTY: SHX992

## 2020-11-19 LAB — POCT I-STAT, CHEM 8
BUN: 24 mg/dL — ABNORMAL HIGH (ref 8–23)
Calcium, Ion: 1.19 mmol/L (ref 1.15–1.40)
Chloride: 106 mmol/L (ref 98–111)
Creatinine, Ser: 1 mg/dL (ref 0.61–1.24)
Glucose, Bld: 226 mg/dL — ABNORMAL HIGH (ref 70–99)
HCT: 45 % (ref 39.0–52.0)
Hemoglobin: 15.3 g/dL (ref 13.0–17.0)
Potassium: 4.2 mmol/L (ref 3.5–5.1)
Sodium: 140 mmol/L (ref 135–145)
TCO2: 22 mmol/L (ref 22–32)

## 2020-11-19 LAB — GLUCOSE, CAPILLARY
Glucose-Capillary: 238 mg/dL — ABNORMAL HIGH (ref 70–99)
Glucose-Capillary: 210 mg/dL — ABNORMAL HIGH (ref 70–99)
Glucose-Capillary: 407 mg/dL — ABNORMAL HIGH (ref 70–99)

## 2020-11-19 SURGERY — ARTHROPLASTY, KNEE, TOTAL, USING IMAGELESS COMPUTER-ASSISTED NAVIGATION
Anesthesia: Spinal | Site: Knee | Laterality: Left

## 2020-11-19 MED ORDER — FLEET ENEMA 7-19 GM/118ML RE ENEM: 1.0000 | ENEMA | Freq: Once | RECTAL | Status: DC | PRN

## 2020-11-19 MED ORDER — PHENYLEPHRINE HCL (PRESSORS) 10 MG/ML IV SOLN
INTRAVENOUS | Status: DC | PRN
Start: 2020-11-19 — End: 2020-11-19
  Administered 2020-11-19 (×6): 100 ug via INTRAVENOUS

## 2020-11-19 MED ORDER — GABAPENTIN 300 MG PO CAPS
ORAL_CAPSULE | ORAL | Status: AC
  Administered 2020-11-19: 300 mg via ORAL
  Filled 2020-11-19: qty 1

## 2020-11-19 MED ORDER — CEFAZOLIN SODIUM-DEXTROSE 2-4 GM/100ML-% IV SOLN
INTRAVENOUS | Status: AC
  Administered 2020-11-20: 2 g via INTRAVENOUS
  Filled 2020-11-19: qty 100

## 2020-11-19 MED ORDER — OXYCODONE HCL 5 MG PO TABS: 10.0000 mg | ORAL_TABLET | ORAL | Status: DC | PRN

## 2020-11-19 MED ORDER — OXYCODONE HCL 5 MG PO TABS: 5.0000 mg | ORAL_TABLET | ORAL | Status: DC | PRN

## 2020-11-19 MED ORDER — INSULIN ASPART PROT & ASPART (70-30 MIX) 100 UNIT/ML ~~LOC~~ SUSP
35.0000 [IU] | Freq: Every day | SUBCUTANEOUS | Status: DC
  Filled 2020-11-19: qty 10

## 2020-11-19 MED ORDER — NEOMYCIN-POLYMYXIN B GU 40-200000 IR SOLN
Status: AC
  Filled 2020-11-19: qty 2

## 2020-11-19 MED ORDER — ONDANSETRON HCL 4 MG/2ML IJ SOLN
INTRAMUSCULAR | Status: DC | PRN
  Administered 2020-11-19: 4 mg via INTRAVENOUS

## 2020-11-19 MED ORDER — ASCORBIC ACID 500 MG PO TABS
1000.0000 mg | ORAL_TABLET | Freq: Every day | ORAL | Status: DC
  Administered 2020-11-20: 1000 mg via ORAL
  Filled 2020-11-19: qty 2

## 2020-11-19 MED ORDER — PRAVASTATIN SODIUM 40 MG PO TABS
40.0000 mg | ORAL_TABLET | Freq: Every day | ORAL | Status: DC
  Administered 2020-11-19: 40 mg via ORAL
  Filled 2020-11-19: qty 2
  Filled 2020-11-19 (×2): qty 1

## 2020-11-19 MED ORDER — MENTHOL 3 MG MT LOZG
1.0000 | LOZENGE | OROMUCOSAL | Status: DC | PRN
  Filled 2020-11-19: qty 9

## 2020-11-19 MED ORDER — ONDANSETRON HCL 4 MG/2ML IJ SOLN: 4.0000 mg | Freq: Four times a day (QID) | INTRAMUSCULAR | Status: DC | PRN

## 2020-11-19 MED ORDER — INSULIN ASPART 100 UNIT/ML IJ SOLN
0.0000 [IU] | Freq: Three times a day (TID) | INTRAMUSCULAR | Status: DC
  Administered 2020-11-19: 5 [IU] via SUBCUTANEOUS
  Administered 2020-11-20: 8 [IU] via SUBCUTANEOUS
  Administered 2020-11-20: 5 [IU] via SUBCUTANEOUS
  Filled 2020-11-19 (×3): qty 1

## 2020-11-19 MED ORDER — PHENOL 1.4 % MT LIQD
1.0000 | OROMUCOSAL | Status: DC | PRN
  Filled 2020-11-19: qty 177

## 2020-11-19 MED ORDER — TRANEXAMIC ACID-NACL 1000-0.7 MG/100ML-% IV SOLN
1000.0000 mg | INTRAVENOUS | Status: AC
  Administered 2020-11-19: 1000 mg via INTRAVENOUS

## 2020-11-19 MED ORDER — BUPIVACAINE HCL (PF) 0.25 % IJ SOLN
INTRAMUSCULAR | Status: AC
  Filled 2020-11-19: qty 30

## 2020-11-19 MED ORDER — DEXAMETHASONE SODIUM PHOSPHATE 10 MG/ML IJ SOLN: 8.0000 mg | Freq: Once | INTRAMUSCULAR | Status: AC

## 2020-11-19 MED ORDER — CELECOXIB 200 MG PO CAPS
ORAL_CAPSULE | ORAL | Status: AC
  Administered 2020-11-19: 400 mg via ORAL
  Filled 2020-11-19: qty 2

## 2020-11-19 MED ORDER — METOCLOPRAMIDE HCL 10 MG PO TABS
10.0000 mg | ORAL_TABLET | Freq: Three times a day (TID) | ORAL | Status: DC
  Administered 2020-11-19 – 2020-11-20 (×4): 10 mg via ORAL
  Filled 2020-11-19 (×4): qty 1

## 2020-11-19 MED ORDER — GABAPENTIN 300 MG PO CAPS: 300.0000 mg | ORAL_CAPSULE | Freq: Once | ORAL | Status: AC

## 2020-11-19 MED ORDER — TRAMADOL HCL 50 MG PO TABS: 50.0000 mg | ORAL_TABLET | Freq: Four times a day (QID) | ORAL | Status: DC | PRN

## 2020-11-19 MED ORDER — METOPROLOL SUCCINATE ER 50 MG PO TB24
100.0000 mg | ORAL_TABLET | Freq: Every day | ORAL | Status: DC
  Administered 2020-11-19: 100 mg via ORAL
  Filled 2020-11-19: qty 2

## 2020-11-19 MED ORDER — ACETAMINOPHEN 10 MG/ML IV SOLN
INTRAVENOUS | Status: AC
  Filled 2020-11-19: qty 100

## 2020-11-19 MED ORDER — BUPIVACAINE LIPOSOME 1.3 % IJ SUSP
INTRAMUSCULAR | Status: AC
  Filled 2020-11-19: qty 20

## 2020-11-19 MED ORDER — SODIUM CHLORIDE 0.9 % IR SOLN
Status: DC | PRN
  Administered 2020-11-19: 3000 mL

## 2020-11-19 MED ORDER — DEXAMETHASONE SODIUM PHOSPHATE 10 MG/ML IJ SOLN
INTRAMUSCULAR | Status: AC
  Administered 2020-11-19: 8 mg via INTRAVENOUS
  Filled 2020-11-19: qty 1

## 2020-11-19 MED ORDER — LACTATED RINGERS IV BOLUS
250.0000 mL | Freq: Once | INTRAVENOUS | Status: DC
Start: 2020-11-19 — End: 2020-11-20

## 2020-11-19 MED ORDER — ORAL CARE MOUTH RINSE: 15.0000 mL | Freq: Once | OROMUCOSAL | Status: DC

## 2020-11-19 MED ORDER — INSULIN ASPART 100 UNIT/ML IJ SOLN
12.0000 [IU] | Freq: Once | INTRAMUSCULAR | Status: AC
  Administered 2020-11-19: 12 [IU] via SUBCUTANEOUS
  Filled 2020-11-19: qty 1

## 2020-11-19 MED ORDER — ACETAMINOPHEN 10 MG/ML IV SOLN
INTRAVENOUS | Status: DC | PRN
Start: 2020-11-19 — End: 2020-11-19
  Administered 2020-11-19: 1000 mg via INTRAVENOUS

## 2020-11-19 MED ORDER — HYDROMORPHONE HCL 1 MG/ML IJ SOLN
0.5000 mg | INTRAMUSCULAR | Status: DC | PRN
Start: 2020-11-19 — End: 2020-11-20

## 2020-11-19 MED ORDER — LACTATED RINGERS IV BOLUS
500.0000 mL | Freq: Once | INTRAVENOUS | Status: AC
  Administered 2020-11-19: 500 mL via INTRAVENOUS

## 2020-11-19 MED ORDER — TRANEXAMIC ACID-NACL 1000-0.7 MG/100ML-% IV SOLN
INTRAVENOUS | Status: AC
  Administered 2020-11-19: 1000 mg via INTRAVENOUS
  Filled 2020-11-19: qty 100

## 2020-11-19 MED ORDER — CHLORHEXIDINE GLUCONATE 0.12 % MT SOLN: 15.0000 mL | Freq: Once | OROMUCOSAL | Status: DC

## 2020-11-19 MED ORDER — PROPOFOL 1000 MG/100ML IV EMUL
INTRAVENOUS | Status: AC
  Filled 2020-11-19: qty 100

## 2020-11-19 MED ORDER — VENLAFAXINE HCL ER 37.5 MG PO CP24
37.5000 mg | ORAL_CAPSULE | Freq: Every day | ORAL | Status: DC
  Administered 2020-11-20: 37.5 mg via ORAL
  Filled 2020-11-19: qty 1

## 2020-11-19 MED ORDER — CELECOXIB 200 MG PO CAPS
200.0000 mg | ORAL_CAPSULE | Freq: Two times a day (BID) | ORAL | Status: DC
  Administered 2020-11-19 – 2020-11-20 (×2): 200 mg via ORAL
  Filled 2020-11-19 (×2): qty 1

## 2020-11-19 MED ORDER — SODIUM CHLORIDE 0.9 % IR SOLN: Status: AC | PRN

## 2020-11-19 MED ORDER — GLIPIZIDE 5 MG PO TABS
5.0000 mg | ORAL_TABLET | Freq: Two times a day (BID) | ORAL | Status: DC
  Administered 2020-11-20: 5 mg via ORAL
  Filled 2020-11-19: qty 1

## 2020-11-19 MED ORDER — TRANEXAMIC ACID-NACL 1000-0.7 MG/100ML-% IV SOLN
1000.0000 mg | Freq: Once | INTRAVENOUS | Status: AC
  Filled 2020-11-19: qty 100

## 2020-11-19 MED ORDER — ALUM & MAG HYDROXIDE-SIMETH 200-200-20 MG/5ML PO SUSP: 30.0000 mL | ORAL | Status: DC | PRN

## 2020-11-19 MED ORDER — SENNOSIDES-DOCUSATE SODIUM 8.6-50 MG PO TABS
1.0000 | ORAL_TABLET | Freq: Two times a day (BID) | ORAL | Status: DC
  Administered 2020-11-19 – 2020-11-20 (×2): 1 via ORAL
  Filled 2020-11-19 (×2): qty 1

## 2020-11-19 MED ORDER — CELECOXIB 200 MG PO CAPS: 400.0000 mg | ORAL_CAPSULE | Freq: Once | ORAL | Status: AC

## 2020-11-19 MED ORDER — TRIAMTERENE-HCTZ 37.5-25 MG PO TABS
1.0000 | ORAL_TABLET | Freq: Every day | ORAL | Status: DC
  Administered 2020-11-19: 1 via ORAL
  Filled 2020-11-19 (×2): qty 1

## 2020-11-19 MED ORDER — ACETAMINOPHEN 325 MG PO TABS: 325.0000 mg | ORAL_TABLET | Freq: Four times a day (QID) | ORAL | Status: DC | PRN

## 2020-11-19 MED ORDER — INSULIN ASPART PROT & ASPART (70-30 MIX) 100 UNIT/ML ~~LOC~~ SUSP: 35.0000 [IU] | Freq: Two times a day (BID) | SUBCUTANEOUS | Status: DC

## 2020-11-19 MED ORDER — SURGIPHOR WOUND IRRIGATION SYSTEM - OPTIME
TOPICAL | Status: DC | PRN
  Administered 2020-11-19: 1 via TOPICAL

## 2020-11-19 MED ORDER — DILTIAZEM HCL ER COATED BEADS 240 MG PO CP24
240.0000 mg | ORAL_CAPSULE | Freq: Every evening | ORAL | Status: DC
  Administered 2020-11-19: 240 mg via ORAL
  Filled 2020-11-19: qty 1

## 2020-11-19 MED ORDER — CHLORHEXIDINE GLUCONATE 0.12 % MT SOLN
OROMUCOSAL | Status: AC
  Filled 2020-11-19: qty 15

## 2020-11-19 MED ORDER — FENOFIBRATE 160 MG PO TABS
160.0000 mg | ORAL_TABLET | Freq: Every day | ORAL | Status: DC
  Administered 2020-11-20: 160 mg via ORAL
  Filled 2020-11-19: qty 1

## 2020-11-19 MED ORDER — ALBUMIN HUMAN 5 % IV SOLN
INTRAVENOUS | Status: DC | PRN
Start: 2020-11-19 — End: 2020-11-19

## 2020-11-19 MED ORDER — SODIUM CHLORIDE 0.9 % IV SOLN
INTRAVENOUS | Status: DC | PRN
  Administered 2020-11-19: 60 ug/min via INTRAVENOUS

## 2020-11-19 MED ORDER — VITAMIN B-12 1000 MCG PO TABS
500.0000 ug | ORAL_TABLET | Freq: Every day | ORAL | Status: DC
  Administered 2020-11-20: 500 ug via ORAL
  Filled 2020-11-19: qty 1

## 2020-11-19 MED ORDER — INSULIN ASPART PROT & ASPART (70-30 MIX) 100 UNIT/ML ~~LOC~~ SUSP
45.0000 [IU] | Freq: Every day | SUBCUTANEOUS | Status: DC
  Administered 2020-11-20: 45 [IU] via SUBCUTANEOUS
  Filled 2020-11-19: qty 10

## 2020-11-19 MED ORDER — MIDAZOLAM HCL 2 MG/2ML IJ SOLN
INTRAMUSCULAR | Status: AC
  Filled 2020-11-19: qty 2

## 2020-11-19 MED ORDER — ONDANSETRON HCL 4 MG PO TABS: 4.0000 mg | ORAL_TABLET | Freq: Four times a day (QID) | ORAL | Status: DC | PRN

## 2020-11-19 MED ORDER — BUPIVACAINE HCL (PF) 0.5 % IJ SOLN
INTRAMUSCULAR | Status: DC | PRN
  Administered 2020-11-19: 3.2 mL

## 2020-11-19 MED ORDER — ENOXAPARIN SODIUM 30 MG/0.3ML IJ SOSY
30.0000 mg | PREFILLED_SYRINGE | Freq: Two times a day (BID) | INTRAMUSCULAR | Status: DC
  Administered 2020-11-20: 30 mg via SUBCUTANEOUS
  Filled 2020-11-19: qty 0.3

## 2020-11-19 MED ORDER — FLUTICASONE PROPIONATE 50 MCG/ACT NA SUSP
2.0000 | Freq: Every day | NASAL | Status: DC | PRN
  Filled 2020-11-19: qty 16

## 2020-11-19 MED ORDER — CEFAZOLIN SODIUM-DEXTROSE 2-4 GM/100ML-% IV SOLN
2.0000 g | INTRAVENOUS | Status: AC
  Administered 2020-11-19: 2 g via INTRAVENOUS

## 2020-11-19 MED ORDER — INSULIN ASPART 100 UNIT/ML IJ SOLN: 0.0000 [IU] | Freq: Every day | INTRAMUSCULAR | Status: DC

## 2020-11-19 MED ORDER — FERROUS SULFATE 325 (65 FE) MG PO TABS
325.0000 mg | ORAL_TABLET | Freq: Two times a day (BID) | ORAL | Status: DC
  Administered 2020-11-19 – 2020-11-20 (×2): 325 mg via ORAL
  Filled 2020-11-19 (×2): qty 1

## 2020-11-19 MED ORDER — BUPIVACAINE HCL (PF) 0.5 % IJ SOLN
INTRAMUSCULAR | Status: AC
  Filled 2020-11-19: qty 10

## 2020-11-19 MED ORDER — PROPOFOL 500 MG/50ML IV EMUL
INTRAVENOUS | Status: DC | PRN
  Administered 2020-11-19: 150 ug/kg/min via INTRAVENOUS

## 2020-11-19 MED ORDER — ACETAMINOPHEN 500 MG PO TABS
1000.0000 mg | ORAL_TABLET | Freq: Four times a day (QID) | ORAL | Status: AC
  Administered 2020-11-19 – 2020-11-20 (×4): 1000 mg via ORAL
  Filled 2020-11-19 (×4): qty 2

## 2020-11-19 MED ORDER — ONDANSETRON HCL 4 MG/2ML IJ SOLN: 4.0000 mg | Freq: Once | INTRAMUSCULAR | Status: DC | PRN

## 2020-11-19 MED ORDER — SODIUM CHLORIDE 0.9 % IV SOLN: INTRAVENOUS | Status: DC

## 2020-11-19 MED ORDER — GABAPENTIN 300 MG PO CAPS
900.0000 mg | ORAL_CAPSULE | Freq: Every day | ORAL | Status: DC
  Administered 2020-11-19: 900 mg via ORAL
  Filled 2020-11-19: qty 3

## 2020-11-19 MED ORDER — MIDAZOLAM HCL 5 MG/5ML IJ SOLN
INTRAMUSCULAR | Status: DC | PRN
  Administered 2020-11-19 (×2): 1 mg via INTRAVENOUS

## 2020-11-19 MED ORDER — METFORMIN HCL 500 MG PO TABS
1000.0000 mg | ORAL_TABLET | Freq: Two times a day (BID) | ORAL | Status: DC
  Administered 2020-11-19 – 2020-11-20 (×2): 1000 mg via ORAL
  Filled 2020-11-19 (×2): qty 2

## 2020-11-19 MED ORDER — PROPOFOL 10 MG/ML IV BOLUS
INTRAVENOUS | Status: AC
  Filled 2020-11-19: qty 20

## 2020-11-19 MED ORDER — SODIUM CHLORIDE 0.9 % IJ SOLN
INTRAMUSCULAR | Status: DC | PRN
  Administered 2020-11-19: 120 mL

## 2020-11-19 MED ORDER — BISACODYL 10 MG RE SUPP: 10.0000 mg | Freq: Every day | RECTAL | Status: DC | PRN

## 2020-11-19 MED ORDER — PANTOPRAZOLE SODIUM 40 MG PO TBEC
40.0000 mg | DELAYED_RELEASE_TABLET | Freq: Two times a day (BID) | ORAL | Status: DC
  Administered 2020-11-19 – 2020-11-20 (×2): 40 mg via ORAL
  Filled 2020-11-19 (×2): qty 1

## 2020-11-19 MED ORDER — MAGNESIUM HYDROXIDE 400 MG/5ML PO SUSP
30.0000 mL | Freq: Every day | ORAL | Status: DC
  Administered 2020-11-19 – 2020-11-20 (×2): 30 mL via ORAL
  Filled 2020-11-19 (×2): qty 30

## 2020-11-19 MED ORDER — ADULT MULTIVITAMIN W/MINERALS CH
1.0000 | ORAL_TABLET | Freq: Every day | ORAL | Status: DC
  Administered 2020-11-20: 1 via ORAL
  Filled 2020-11-19: qty 1

## 2020-11-19 MED ORDER — DIPHENHYDRAMINE HCL 12.5 MG/5ML PO ELIX: 12.5000 mg | ORAL_SOLUTION | ORAL | Status: DC | PRN

## 2020-11-19 MED ORDER — SODIUM CHLORIDE FLUSH 0.9 % IV SOLN
INTRAVENOUS | Status: AC
  Filled 2020-11-19: qty 40

## 2020-11-19 MED ORDER — ALBUMIN HUMAN 5 % IV SOLN
INTRAVENOUS | Status: AC
  Filled 2020-11-19: qty 250

## 2020-11-19 MED ORDER — FENTANYL CITRATE (PF) 100 MCG/2ML IJ SOLN: 25.0000 ug | INTRAMUSCULAR | Status: DC | PRN

## 2020-11-19 MED ORDER — CEFAZOLIN SODIUM-DEXTROSE 2-4 GM/100ML-% IV SOLN
2.0000 g | Freq: Four times a day (QID) | INTRAVENOUS | Status: AC
Start: 2020-11-19 — End: 2020-11-20
  Administered 2020-11-19: 2 g via INTRAVENOUS
  Filled 2020-11-19 (×2): qty 100

## 2020-11-19 MED ORDER — ACETAMINOPHEN 10 MG/ML IV SOLN
1000.0000 mg | Freq: Four times a day (QID) | INTRAVENOUS | Status: DC
Start: 2020-11-19 — End: 2020-11-19

## 2020-11-19 SURGICAL SUPPLY — 78 items
ATTUNE MED DOME PAT 41 KNEE (Knees) ×1 IMPLANT
ATTUNE PS FEM LT SZ 6 CEM KNEE (Femur) ×1 IMPLANT
ATTUNE PSRP INSR SZ6 5 KNEE (Insert) ×1 IMPLANT
BASE TIBIAL ROT PLAT SZ 8 KNEE (Knees) IMPLANT
BATTERY INSTRU NAVIGATION (MISCELLANEOUS) ×8 IMPLANT
BLADE SAW 70X12.5 (BLADE) ×2 IMPLANT
BLADE SAW 90X13X1.19 OSCILLAT (BLADE) ×2 IMPLANT
BLADE SAW 90X25X1.19 OSCILLAT (BLADE) ×2 IMPLANT
BONE CEMENT GENTAMICIN (Cement) ×4 IMPLANT
CEMENT BONE GENTAMICIN 40 (Cement) IMPLANT
COOLER POLAR GLACIER W/PUMP (MISCELLANEOUS) ×2 IMPLANT
CUFF TOURN SGL QUICK 24 (TOURNIQUET CUFF) ×1
CUFF TOURN SGL QUICK 34 (TOURNIQUET CUFF)
CUFF TRNQT CYL 24X4X16.5-23 (TOURNIQUET CUFF) IMPLANT
CUFF TRNQT CYL 34X4.125X (TOURNIQUET CUFF) IMPLANT
DRAPE 3/4 80X56 (DRAPES) ×2 IMPLANT
DRESSING PEEL AND PLAC PRVNA20 (GAUZE/BANDAGES/DRESSINGS) IMPLANT
DRSG DERMACEA 8X12 NADH (GAUZE/BANDAGES/DRESSINGS) ×2 IMPLANT
DRSG MEPILEX SACRM 8.7X9.8 (GAUZE/BANDAGES/DRESSINGS) ×2 IMPLANT
DRSG OPSITE POSTOP 4X14 (GAUZE/BANDAGES/DRESSINGS) ×2 IMPLANT
DRSG PEEL AND PLACE PREVENA 20 (GAUZE/BANDAGES/DRESSINGS) ×2
DRSG TEGADERM 4X4.75 (GAUZE/BANDAGES/DRESSINGS) ×2 IMPLANT
DURAPREP 26ML APPLICATOR (WOUND CARE) ×4 IMPLANT
ELECT CAUTERY BLADE 6.4 (BLADE) ×2 IMPLANT
ELECT REM PT RETURN 9FT ADLT (ELECTROSURGICAL) ×2
ELECTRODE REM PT RTRN 9FT ADLT (ELECTROSURGICAL) ×1 IMPLANT
EX-PIN ORTHOLOCK NAV 4X150 (PIN) ×4 IMPLANT
GAUZE 4X4 16PLY ~~LOC~~+RFID DBL (SPONGE) ×2 IMPLANT
GLOVE SURG ENC MOIS LTX SZ7.5 (GLOVE) ×4 IMPLANT
GLOVE SURG ENC TEXT LTX SZ7.5 (GLOVE) ×4 IMPLANT
GLOVE SURG UNDER LTX SZ8 (GLOVE) ×2 IMPLANT
GLOVE SURG UNDER POLY LF SZ7.5 (GLOVE) ×2 IMPLANT
GOWN STRL REUS W/ TWL LRG LVL3 (GOWN DISPOSABLE) ×2 IMPLANT
GOWN STRL REUS W/ TWL XL LVL3 (GOWN DISPOSABLE) ×1 IMPLANT
GOWN STRL REUS W/TWL LRG LVL3 (GOWN DISPOSABLE) ×2
GOWN STRL REUS W/TWL XL LVL3 (GOWN DISPOSABLE) ×1
HEMOVAC 400CC 10FR (MISCELLANEOUS) ×1 IMPLANT
HOLDER FOLEY CATH W/STRAP (MISCELLANEOUS) ×2 IMPLANT
HOOD PEEL AWAY FLYTE STAYCOOL (MISCELLANEOUS) ×4 IMPLANT
IRRIGATION SURGIPHOR STRL (IV SOLUTION) ×2 IMPLANT
IV NS IRRIG 3000ML ARTHROMATIC (IV SOLUTION) ×2 IMPLANT
KIT PUMP PREVENA PLUS 14DAY (MISCELLANEOUS) ×1 IMPLANT
KIT TURNOVER KIT A (KITS) ×2 IMPLANT
KNIFE SCULPS 14X20 (INSTRUMENTS) ×2 IMPLANT
LABEL OR SOLS (LABEL) ×2 IMPLANT
LEGEND II FIXATION PIN ×1 IMPLANT
MANIFOLD NEPTUNE II (INSTRUMENTS) ×4 IMPLANT
NDL SAFETY ECLIPSE 18X1.5 (NEEDLE) ×1 IMPLANT
NDL SPNL 20GX3.5 QUINCKE YW (NEEDLE) ×2 IMPLANT
NEEDLE HYPO 18GX1.5 SHARP (NEEDLE) ×1
NEEDLE SPNL 20GX3.5 QUINCKE YW (NEEDLE) ×4 IMPLANT
NS IRRIG 500ML POUR BTL (IV SOLUTION) ×2 IMPLANT
PACK TOTAL KNEE (MISCELLANEOUS) ×2 IMPLANT
PAD WRAPON POLAR KNEE (MISCELLANEOUS) ×1 IMPLANT
PENCIL SMOKE EVACUATOR COATED (MISCELLANEOUS) ×2 IMPLANT
PIN DRILL FIX HALF THREAD (BIT) ×4 IMPLANT
PIN FIXATION 1/8DIA X 3INL (PIN) ×1 IMPLANT
PULSAVAC PLUS IRRIG FAN TIP (DISPOSABLE) ×2
SOL PREP PVP 2OZ (MISCELLANEOUS) ×2
SOLUTION PREP PVP 2OZ (MISCELLANEOUS) ×1 IMPLANT
SPONGE DRAIN TRACH 4X4 STRL 2S (GAUZE/BANDAGES/DRESSINGS) ×2 IMPLANT
SPONGE T-LAP 18X18 ~~LOC~~+RFID (SPONGE) ×6 IMPLANT
STAPLER SKIN PROX 35W (STAPLE) ×2 IMPLANT
STOCKINETTE IMPERV 14X48 (MISCELLANEOUS) IMPLANT
STRAP TIBIA SHORT (MISCELLANEOUS) ×2 IMPLANT
SUCTION FRAZIER HANDLE 10FR (MISCELLANEOUS) ×1
SUCTION TUBE FRAZIER 10FR DISP (MISCELLANEOUS) ×1 IMPLANT
SUT VIC AB 0 CT1 36 (SUTURE) ×4 IMPLANT
SUT VIC AB 1 CT1 36 (SUTURE) ×4 IMPLANT
SUT VIC AB 2-0 CT2 27 (SUTURE) ×2 IMPLANT
SYR 20ML LL LF (SYRINGE) ×2 IMPLANT
SYR 30ML LL (SYRINGE) ×4 IMPLANT
TIBIAL BASE ROT PLAT SZ 8 KNEE (Knees) ×2 IMPLANT
TIP FAN IRRIG PULSAVAC PLUS (DISPOSABLE) ×1 IMPLANT
TOWEL OR 17X26 4PK STRL BLUE (TOWEL DISPOSABLE) ×2 IMPLANT
TOWER CARTRIDGE SMART MIX (DISPOSABLE) ×2 IMPLANT
TRAY FOLEY MTR SLVR 16FR STAT (SET/KITS/TRAYS/PACK) ×2 IMPLANT
WRAPON POLAR PAD KNEE (MISCELLANEOUS) ×2

## 2020-11-19 NOTE — Anesthesia Procedure Notes (Signed)
Procedure Name: MAC Date/Time: 11/19/2020 11:30 AM Performed by: Nelda Marseille, CRNA Pre-anesthesia Checklist: Patient identified, Emergency Drugs available, Suction available and Patient being monitored Patient Re-evaluated:Patient Re-evaluated prior to induction Oxygen Delivery Method: Simple face mask

## 2020-11-19 NOTE — Transfer of Care (Signed)
Immediate Anesthesia Transfer of Care Note  Patient: Kristopher Oneal  Procedure(s) Performed: COMPUTER ASSISTED TOTAL KNEE ARTHROPLASTY (Left: Knee)  Patient Location: PACU  Anesthesia Type:MAC and Spinal  Level of Consciousness: awake and alert   Airway & Oxygen Therapy: Patient Spontanous Breathing and Patient connected to face mask oxygen  Post-op Assessment: Report given to RN and Post -op Vital signs reviewed and stable  Post vital signs: Reviewed and stable  Last Vitals:  Vitals Value Taken Time  BP 122/82 11/19/20 1500  Temp 36.1 C 11/19/20 1455  Pulse 94 11/19/20 1501  Resp 16 11/19/20 1501  SpO2 97 % 11/19/20 1501  Vitals shown include unvalidated device data.  Last Pain:  Vitals:   11/19/20 1455  TempSrc:   PainSc: Asleep         Complications: No notable events documented.

## 2020-11-19 NOTE — Op Note (Addendum)
OPERATIVE NOTE  DATE OF SURGERY:  11/19/2020  PATIENT NAME:  Kristopher Oneal   DOB: 10/26/1950  MRN: [id number]  PRE-OPERATIVE DIAGNOSIS: Degenerative arthrosis of the left knee, primary  POST-OPERATIVE DIAGNOSIS:  Same  PROCEDURE:  Left total knee arthroplasty using computer-assisted navigation  SURGEON:  Jena Gauss. M.D.  ASSISTANT: Baldwin Jamaica, PA-C (present and scrubbed throughout the case, critical for assistance with exposure, retraction, instrumentation, and closure)  ANESTHESIA: spinal  ESTIMATED BLOOD LOSS: 50 mL  FLUIDS REPLACED: 1500 mL of crystalloid  250 ml of colloid  TOURNIQUET TIME: 93 minutes  DRAINS: Prevena wound VAC  SOFT TISSUE RELEASES: Anterior cruciate ligament, posterior cruciate ligament, deep medial collateral ligament, patellofemoral ligament  IMPLANTS UTILIZED: DePuy Attune size 6 posterior stabilized femoral component (cemented), size 8 rotating platform tibial component (cemented), 41 mm medialized dome patella (cemented), and a 5 mm stabilized rotating platform polyethylene insert.  INDICATIONS FOR SURGERY: Kristopher Oneal is a 70 y.o. year old male with a long history of progressive knee pain. X-rays demonstrated severe degenerative changes in tricompartmental fashion. The patient had not seen any significant improvement despite conservative nonsurgical intervention. After discussion of the risks and benefits of surgical intervention, the patient expressed understanding of the risks benefits and agree with plans for total knee arthroplasty.   The risks, benefits, and alternatives were discussed at length including but not limited to the risks of infection, bleeding, nerve injury, stiffness, blood clots, the need for revision surgery, cardiopulmonary complications, among others, and they were willing to proceed.  PROCEDURE IN DETAIL: The patient was brought into the operating room and, after adequate spinal anesthesia was achieved, a tourniquet was placed  on the patient's upper thigh. The patient's knee and leg were cleaned and prepped with alcohol and DuraPrep and draped in the usual sterile fashion. A "timeout" was performed as per usual protocol. The lower extremity was exsanguinated using an Esmarch, and the tourniquet was inflated to 300 mmHg. An anterior longitudinal incision was made followed by a standard mid vastus approach. The deep fibers of the medial collateral ligament were elevated in a subperiosteal fashion off of the medial flare of the tibia so as to maintain a continuous soft tissue sleeve. The patella was subluxed laterally and the patellofemoral ligament was incised. Inspection of the knee demonstrated severe degenerative changes with full-thickness loss of articular cartilage. Osteophytes were debrided using a rongeur. Anterior and posterior cruciate ligaments were excised. Two 4.0 mm Schanz pins were inserted in the femur and into the tibia for attachment of the array of trackers used for computer-assisted navigation. Hip center was identified using a circumduction technique. Distal landmarks were mapped using the computer. The distal femur and proximal tibia were mapped using the computer. The distal femoral cutting guide was positioned using computer-assisted navigation so as to achieve a 5 distal valgus cut. The femur was sized and it was felt that a size 6 femoral component was appropriate. A size 6 femoral cutting guide was positioned and the anterior cut was performed and verified using the computer. This was followed by completion of the posterior and chamfer cuts. Femoral cutting guide for the central box was then positioned in the center box cut was performed.  Attention was then directed to the proximal tibia. Medial and lateral menisci were excised. The extramedullary tibial cutting guide was positioned using computer-assisted navigation so as to achieve a 0 varus-valgus alignment and 3 posterior slope. The cut was performed and  verified using the computer. The proximal  tibia was sized and it was felt that a size 8 tibial tray was appropriate. Tibial and femoral trials were inserted followed by insertion of a 5 mm polyethylene insert. This allowed for excellent mediolateral soft tissue balancing both in flexion and in full extension. Finally, the patella was cut and prepared so as to accommodate a 41 mm medialized dome patella. A patella trial was placed and the knee was placed through a range of motion with excellent patellar tracking appreciated. The femoral trial was removed after debridement of posterior osteophytes. The central post-hole for the tibial component was reamed followed by insertion of a keel punch. Tibial trials were then removed. Cut surfaces of bone were irrigated with copious amounts of normal saline using pulsatile lavage and then suctioned dry. Polymethylmethacrylate cement with gentamicin was prepared in the usual fashion using a vacuum mixer. Cement was applied to the cut surface of the proximal tibia as well as along the undersurface of a size 8 rotating platform tibial component. Tibial component was positioned and impacted into place. Excess cement was removed using Personal assistant. Cement was then applied to the cut surfaces of the femur as well as along the posterior flanges of the size 6 femoral component. The femoral component was positioned and impacted into place. Excess cement was removed using Personal assistant. A 5 mm polyethylene trial was inserted and the knee was brought into full extension with steady axial compression applied. Finally, cement was applied to the backside of a 41 mm medialized dome patella and the patellar component was positioned and patellar clamp applied. Excess cement was removed using Personal assistant. After adequate curing of the cement, the tourniquet was deflated after a total tourniquet time of 93 minutes. Hemostasis was achieved using electrocautery. The knee was irrigated with  copious amounts of normal saline using pulsatile lavage followed by 500 ml of Surgiphor and then suctioned dry. 20 mL of 1.3% Exparel and 60 mL of 0.25% Marcaine in 40 mL of normal saline was injected along the posterior capsule, medial and lateral gutters, and along the arthrotomy site. A 5 mm stabilized rotating platform polyethylene insert was inserted and the knee was placed through a range of motion with excellent mediolateral soft tissue balancing appreciated and excellent patellar tracking noted.  The medial parapatellar portion of the incision was reapproximated using interrupted sutures of #1 Vicryl. Subcutaneous tissue was approximated in layers using first #0 Vicryl followed #2-0 Vicryl. The skin was approximated with skin staples. A Prevena wound VAC was applied.  The patient tolerated the procedure well and was transported to the recovery room in stable condition.    Tymeshia Awan P. Angie Fava., M.D.

## 2020-11-19 NOTE — Progress Notes (Signed)
BG at 407. Contacted ortho provider on-call. 12 units of short acting ordered.   Patient reports taking 70/30 insulin at bedtime versus with supper, and he reports taking 70/30 before breakfast along with Glipizide. Contacted pharmacy in regards to [facility] changes.

## 2020-11-19 NOTE — Anesthesia Preprocedure Evaluation (Signed)
Anesthesia Evaluation  Patient identified by MRN, date of birth, ID band Patient awake    Reviewed: Allergy & Precautions, NPO status , Patient's Chart, lab work & pertinent test results, reviewed documented beta blocker date and time   History of Anesthesia Complications Negative for: history of anesthetic complications  Airway Mallampati: II       Dental  (+) Dental Advidsory Given   Pulmonary neg pulmonary ROS, former smoker,           Cardiovascular Exercise Tolerance: Good hypertension, Pt. on medications (-) angina(-) Past MI and (-) Cardiac Stents + dysrhythmias Atrial Fibrillation + Valvular Problems/Murmurs      Neuro/Psych PSYCHIATRIC DISORDERS Anxiety negative neurological ROS     GI/Hepatic negative GI ROS, Neg liver ROS,   Endo/Other  diabetes  Renal/GU negative Renal ROS  negative genitourinary   Musculoskeletal  (+) Arthritis ,   Abdominal   Peds negative pediatric ROS (+)  Hematology negative hematology ROS (+)   Anesthesia Other Findings Past Medical History: No date: Arthritis No date: Diabetes mellitus without complication (HCC)     Comment:  type 2 No date: Dysrhythmia     Comment:  PVC No date: Hypertension  Reproductive/Obstetrics                             Anesthesia Physical  Anesthesia Plan  ASA: 3  Anesthesia Plan: Spinal   Post-op Pain Management:    Induction: Intravenous  PONV Risk Score and Plan: 1 and TIVA and Propofol infusion  Airway Management Planned: Nasal Cannula and Natural Airway  Additional Equipment:   Intra-op Plan:   Post-operative Plan:   Informed Consent: I have reviewed the patients History and Physical, chart, labs and discussed the procedure including the risks, benefits and alternatives for the proposed anesthesia with the patient or authorized representative who has indicated his/her understanding and acceptance.      Dental advisory given  Plan Discussed with: Surgeon and CRNA  Anesthesia Plan Comments:         Anesthesia Quick Evaluation

## 2020-11-19 NOTE — H&P (Signed)
The patient has been re-examined, and the chart reviewed, and there have been no interval changes to the documented history and physical.    The risks, benefits, and alternatives have been discussed at length. The patient expressed understanding of the risks benefits and agreed with plans for surgical intervention.  Iviona Hole P. Cherica Heiden, Jr. M.D.    

## 2020-11-19 NOTE — Anesthesia Procedure Notes (Signed)
Spinal  Patient location during procedure: OR Start time: 11/19/2020 11:19 AM End time: 11/19/2020 11:23 AM Reason for block: surgical anesthesia Staffing Performed: other anesthesia staff  Anesthesiologist: Lenard Simmer, MD Resident/CRNA: Junious Silk, CRNA Other anesthesia staff: Adalberto Cole, RN Preanesthetic Checklist Completed: patient identified, IV checked, site marked, risks and benefits discussed, surgical consent, monitors and equipment checked, pre-op evaluation and timeout performed Spinal Block Patient position: sitting Prep: DuraPrep Patient monitoring: heart rate, cardiac monitor, continuous pulse ox and blood pressure Approach: midline Location: L3-4 Injection technique: single-shot Needle Needle type: Sprotte  Needle gauge: 24 G Needle length: 9 cm Assessment Sensory level: T4 Events: CSF return

## 2020-11-19 NOTE — Progress Notes (Signed)
PT Cancellation Note  Patient Details Name: Roderick Calo MRN: [id number] DOB: 1950/10/26   Cancelled Treatment:    Reason Eval/Treat Not Completed: Other (comment): Pt not appropriate to participate with PT services at this time. Pt presented with only trace bilateral ankle AROM and was unable to hold ankle position against even minimal resistance.  Will attempt to see pt at a future date/time as medically appropriate.     Ovidio Hanger PT, DPT 11/19/20, 4:38 PM

## 2020-11-20 ENCOUNTER — Encounter: Payer: Self-pay | Admitting: Orthopedic Surgery

## 2020-11-20 DIAGNOSIS — M1712 Unilateral primary osteoarthritis, left knee: Secondary | ICD-10-CM | POA: Diagnosis not present

## 2020-11-20 LAB — GLUCOSE, CAPILLARY
Glucose-Capillary: 239 mg/dL — ABNORMAL HIGH (ref 70–99)
Glucose-Capillary: 262 mg/dL — ABNORMAL HIGH (ref 70–99)
Glucose-Capillary: 236 mg/dL — ABNORMAL HIGH (ref 70–99)

## 2020-11-20 MED ORDER — ENOXAPARIN SODIUM 40 MG/0.4ML IJ SOSY: 40.0000 mg | PREFILLED_SYRINGE | INTRAMUSCULAR | 0 refills | Status: DC

## 2020-11-20 MED ORDER — TRAMADOL HCL 50 MG PO TABS: 50.0000 mg | ORAL_TABLET | Freq: Four times a day (QID) | ORAL | 0 refills | Status: DC | PRN

## 2020-11-20 MED ORDER — CELECOXIB 200 MG PO CAPS: 200.0000 mg | ORAL_CAPSULE | Freq: Two times a day (BID) | ORAL | 0 refills | Status: DC

## 2020-11-20 NOTE — Evaluation (Signed)
Physical Therapy Evaluation Patient Details Name: Kristopher Oneal MRN: [id number] DOB: [birth year] Today's Date: 11/20/2020   History of Present Illness  Pt is a 70 yo male who is s/p elective L TKA. PMH includes atrial fibrillation, anxiety, DM, R TKA,and HTN.  Clinical Impression  Pt was pleasant and motivated to participate during the session. He gave good effort throughout session. Pt's vitals were stable throughout session. Pt able to perform all functional mobility with supervision-min guard. Pt was overall steady ambulating with RW gradually reducing weight bearing through bilateral UEs and required intermittent cueing for sequencing and walker placement. Pt demonstrated good eccentric/concentric control while navigating stairs but required frequent verbal cues for sequencing. Pt's spouse will be present next visit if available to participate with stair training.  Pt will benefit from HHPT upon discharge to safely address deficits listed in patient problem list for decreased caregiver assistance and eventual return to PLOF.       Follow Up Recommendations Home health PT;Supervision for mobility/OOB    Equipment Recommendations  None recommended by PT    Recommendations for Other Services       Precautions / Restrictions Precautions Precautions: Fall Restrictions Weight Bearing Restrictions: Yes LLE Weight Bearing: Weight bearing as tolerated      Mobility  Bed Mobility Overal bed mobility: Needs Assistance Bed Mobility: Supine to Sit     Supine to sit: Supervision     General bed mobility comments: Verbal cues for sequencing    Transfers Overall transfer level: Needs assistance Equipment used: Rolling walker (2 wheeled) Transfers: Sit to/from Stand Sit to Stand: Min guard;From elevated surface         General transfer comment: Verbal cues for sequencing, walker placement, and hand/foot placement.  Ambulation/Gait Ambulation/Gait assistance: Min guard Gait  Distance (Feet): 100 Feet x2 Assistive device: Rolling walker (2 wheeled) Gait Pattern/deviations: Step-through pattern;Decreased step length - right;Decreased stance time - left Gait velocity: decreased   General Gait Details: Verbal cues for walker placement and sequencing.  Stairs Stairs: Yes Stairs assistance: Min guard Stair Management: One rail Right;One rail Left;Forwards Number of Stairs: 4 x2 General stair comments: Pt performed first attempt with only left railing and 2nd attempt with only right railing. Pt ascended/descended stairs holding onto railing with both UEs. Intermittent verbal cues for sequencing.  Wheelchair Mobility    Modified Rankin (Stroke Patients Only)       Balance Overall balance assessment: Needs assistance Sitting-balance support: No upper extremity supported;Feet supported Sitting balance-Leahy Scale: Good Sitting balance - Comments: Supervision seated on EOB   Standing balance support: Bilateral upper extremity supported;During functional activity Standing balance-Leahy Scale: Fair Standing balance comment: Reliance on UE support through RW                             Pertinent Vitals/Pain Pain Assessment: No/denies pain    Home Living Family/patient expects to be discharged to:: Private residence Living Arrangements: Spouse/significant other Available Help at Discharge: Family;Available 24 hours/day Type of Home: Other(Comment) (RV) Home Access: Stairs to enter Entrance Stairs-Rails: Left Entrance Stairs-Number of Steps: 4 to enter RV with rail on L side; 3 to Lawyer suite with rail on R side Home Layout: Multi-level Home Equipment: Cane - single point;Walker - 2 wheels;Bedside commode;Toilet riser      Prior Function Level of Independence: Independent with assistive device(s)         Comments: Pt ambulates primarily without AD but uses SPC  sometimes for longer community distances. Independent with ADLs; No  fall hx besides being tripped by dog     Hand Dominance        Extremity/Trunk Assessment   Upper Extremity Assessment Upper Extremity Assessment: Generalized weakness    Lower Extremity Assessment Lower Extremity Assessment: Generalized weakness    Cervical / Trunk Assessment Cervical / Trunk Assessment: Normal  Communication   Communication: No difficulties  Cognition Arousal/Alertness: Awake/alert Behavior During Therapy: WFL for tasks assessed/performed Overall Cognitive Status: Within Functional Limits for tasks assessed                                        General Comments      Exercises Total Joint Exercises Ankle Circles/Pumps: AROM;Left;10 reps;Supine Quad Sets: AROM;Left;10 reps;Supine Heel Slides: AROM;Left;5 reps;Supine;Strengthening Hip ABduction/ADduction: AROM;Strengthening;Left;5 reps;Supine Straight Leg Raises: AROM;Strengthening;Left;10 reps;Supine Long Arc Quad: AROM;Strengthening;Left;10 reps;Seated Knee Flexion: AROM;Strengthening;Left;10 reps;Seated Goniometric ROM: Left knee AROM 0-90 degrees Marching in Standing: AROM;Strengthening;Both;10 reps;Standing Other Exercises Other Exercises: HEP provided verbally and via handout and he demonstrated understanding. Other Exercises: Stair training provided verbally and via demonstration and he demonstrated understanding. Other Exercises: Static sitting 1-60min x 2 with supervision for improved trunk control and balance. Other Exercises: Static standing 1-2 min with min guard for improved activity tolerance.   Assessment/Plan    PT Assessment Patient needs continued PT services  PT Problem List Decreased strength;Decreased range of motion;Decreased activity tolerance;Decreased balance;Decreased mobility;Decreased knowledge of use of DME;Decreased safety awareness;Decreased knowledge of precautions;Pain       PT Treatment Interventions DME instruction;Gait training;Stair  training;Functional mobility training;Therapeutic activities;Therapeutic exercise;Balance training;Patient/family education    PT Goals (Current goals can be found in the Care Plan section)  Acute Rehab PT Goals Patient Stated Goal: Play golf, kayak, and exercise. PT Goal Formulation: With patient Time For Goal Achievement: 12/03/20 Potential to Achieve Goals: Good    Frequency BID   Barriers to discharge        Co-evaluation               AM-PAC PT "6 Clicks" Mobility  Outcome Measure Help needed turning from your back to your side while in a flat bed without using bedrails?: None Help needed moving from lying on your back to sitting on the side of a flat bed without using bedrails?: A Little Help needed moving to and from a bed to a chair (including a wheelchair)?: A Little Help needed standing up from a chair using your arms (e.g., wheelchair or bedside chair)?: A Little Help needed to walk in hospital room?: A Little Help needed climbing 3-5 steps with a railing? : A Little 6 Click Score: 19    End of Session Equipment Utilized During Treatment: Gait belt Activity Tolerance: Patient tolerated treatment well Patient left: in chair;with call bell/phone within reach;with chair alarm set;with family/visitor present;with SCD's reapplied Nurse Communication: Mobility status;Weight bearing status PT Visit Diagnosis: Other abnormalities of gait and mobility (R26.89);Muscle weakness (generalized) (M62.81);Pain;Unsteadiness on feet (R26.81) Pain - Right/Left: Left Pain - part of body: Knee    Time: 8416-6063 PT Time Calculation (min) (ACUTE ONLY): 54 min   Charges:              Desiree Hane SPT 11/20/20, 1:08 PM

## 2020-11-20 NOTE — Progress Notes (Signed)
Discharge instructions given to patient and wife. Both parties verbalized understanding of instructions. Prescription and packet handed to pt

## 2020-11-20 NOTE — Progress Notes (Signed)
Physical Therapy Treatment Patient Details Name: Kristopher Oneal MRN: [id number] DOB: 1951/03/30 Today's Date: 11/20/2020    History of Present Illness Pt is a 70 yo male who is s/p elective L TKA. PMH includes atrial fibrillation, anxiety, DM, R TKA,and HTN.    PT Comments    Pt was pleasant and motivated to participate during the session. Pt's BP assessed seated at 99/59 and in standing at 95/60. Pt performed all functional mobility with min guard assistance. Pt's spouse was present to participate in stair training and pt navigated stairs with good concentric/eccentric control with min verbal cues for sequencing. Pt was overall steady while ambulating using RW with no LOB. Car transfer education provided verbally and via simulation and pt verbalized understanding. Pt will benefit from HHPT upon discharge to safely address deficits listed in patient problem list for decreased caregiver assistance and eventual return to PLOF.     Follow Up Recommendations  Home health PT;Supervision for mobility/OOB     Equipment Recommendations  None recommended by PT    Recommendations for Other Services       Precautions / Restrictions Precautions Precautions: Fall Restrictions Weight Bearing Restrictions: Yes LLE Weight Bearing: Weight bearing as tolerated    Mobility  Bed Mobility    General bed mobility comments: NT, in chair upon arrival    Transfers Overall transfer level: Needs assistance Equipment used: Rolling walker (2 wheeled) Transfers: Sit to/from Stand Sit to Stand: Min guard         General transfer comment: Min verbal cues for sequencing and hand/foot placement. Good eccentric/concentric control from non-elevated surface  Ambulation/Gait Ambulation/Gait assistance: Min guard Gait Distance (Feet): 200 Feetx 1; 150ft x1 Assistive device: Rolling walker (2 wheeled) Gait Pattern/deviations: Step-through pattern;Decreased step length - right;Decreased stance time -  left Gait velocity: decreased   General Gait Details: Verbal cues for walker placement and sequencing. Exhibited decreased WB through UE requiring less support.   Stairs Stairs: Yes Stairs assistance: Min guard Stair Management: One rail Right;One rail Left;Forwards Number of Stairs: 4 x2 General stair comments: Pt performed first attempt with only left railing and 2nd attempt with only right railing. Pt ascended/descended stairs holding onto railing with both UEs. Pt asked to repeat sequencing for stair navaigation and needed min verbal cues to remember.  Intermittent verbal cues for sequencing.   Wheelchair Mobility    Modified Rankin (Stroke Patients Only)       Balance Overall balance assessment: Needs assistance Sitting-balance support: No upper extremity supported;Feet supported Sitting balance-Leahy Scale: Good Sitting balance - Comments: Supervision   Standing balance support: Bilateral upper extremity supported;During functional activity Standing balance-Leahy Scale: Fair Standing balance comment: Reliance on UE support through RW                            Cognition Arousal/Alertness: Awake/alert Behavior During Therapy: WFL for tasks assessed/performed Overall Cognitive Status: Within Functional Limits for tasks assessed                                        Exercises Total Joint Exercises Marching in Standing: AROM;Strengthening;Both;10 reps;Standing Other Exercises Other Exercises: Reviewed stair navigation training with spouse present and pt demonstrated understanding. Other Exercises: Car transfer training provided verbally and via simulation and pt verbalized understanding. Other Exercises: Static standing 2-3 min with min guard for improved activity tolerance.  Other Exercises: Static standing 1-2 min with min guard for improved activity tolerance.    General Comments        Pertinent Vitals/Pain Pain Assessment:  0-10 Pain Score: 3  Pain Location: Left medial knee Pain Descriptors / Indicators: Sore;Aching;Tightness Pain Intervention(s): Monitored during session;Repositioned    Home Living Family/patient expects to be discharged to:: Private residence Living Arrangements: Spouse/significant other Available Help at Discharge: Family;Available 24 hours/day Type of Home: Other(Comment) (RV) Home Access: Stairs to enter Entrance Stairs-Rails: Left Home Layout: Multi-level Home Equipment: Cane - single point;Walker - 2 wheels;Bedside commode;Toilet riser      Prior Function Level of Independence: Independent with assistive device(s)      Comments: Pt ambulates primarily without AD but uses SPC sometimes for longer community distances. Independent with ADLs   PT Goals (current goals can now be found in the care plan section) Acute Rehab PT Goals Patient Stated Goal: Play golf, kayak, and exercise. PT Goal Formulation: With patient Time For Goal Achievement: 12/03/20 Potential to Achieve Goals: Good Progress towards PT goals: Progressing toward goals    Frequency    BID      PT Plan Current plan remains appropriate    Co-evaluation              AM-PAC PT "6 Clicks" Mobility   Outcome Measure  Help needed turning from your back to your side while in a flat bed without using bedrails?: None Help needed moving from lying on your back to sitting on the side of a flat bed without using bedrails?: A Little Help needed moving to and from a bed to a chair (including a wheelchair)?: A Little Help needed standing up from a chair using your arms (e.g., wheelchair or bedside chair)?: A Little Help needed to walk in hospital room?: A Little Help needed climbing 3-5 steps with a railing? : A Little 6 Click Score: 19    End of Session Equipment Utilized During Treatment: Gait belt Activity Tolerance: Patient tolerated treatment well Patient left: in chair;with call bell/phone within  reach;with chair alarm set;with family/visitor present (Pt declined SCDs) Nurse Communication: Mobility status;Weight bearing status; Low BP but asymptomatic PT Visit Diagnosis: Other abnormalities of gait and mobility (R26.89);Muscle weakness (generalized) (M62.81);Pain;Unsteadiness on feet (R26.81) Pain - Right/Left: Left Pain - part of body: Knee     Time: 1540-0867 PT Time Calculation (min) (ACUTE ONLY): 32 min  Charges:  $Gait Training: 8-22 mins $Therapeutic Exercise: 8-22 mins                     Desiree Hane SPT 11/20/20, 4:14 PM

## 2020-11-20 NOTE — Evaluation (Signed)
Occupational Therapy Evaluation Patient Details Name: Kristopher Oneal MRN: [id number] DOB: 1950-05-23 Today's Date: 11/20/2020    History of Present Illness Pt is a 70 yo male who is s/p elective L TKA. PMH includes atrial fibrillation, anxiety, DM, R TKA,and HTN.   Clinical Impression   Pt seen for OT evaluation this date, POD#1 from above surgery. Pt was independent in all ADLs/IADLs prior to surgery. Pt is eager to return to PLOF with less pain and improved safety and independence. Due to current functional impairments (see OT problem list below), pt currently requires MIN GUARD for sit>stand LB dressing with AE, MIN GUARD for standing UB dressing, MIN A for toilet transfers, and MIN GUARD for functional mobility of short household distances with RW. Pt instructed in polar care mgt, falls prevention strategies, home/routines modifications, and DME/AE for LB bathing and dressing tasks; handout provided and pt demonstrated good understanding of sock aide. Pt would benefit from skilled acute OT services including additional instruction in techniques with or without assistive devices for dressing and bathing skills to support recall and carryover prior to discharge and ultimately to maximize safety, independence, and minimize falls risk and caregiver burden. Do not currently anticipate any OT needs following this hospitalization.     Follow Up Recommendations  Follow surgeon's recommendation for DC plan and follow-up therapies    Equipment Recommendations  None recommended by OT       Precautions / Restrictions Precautions Precautions: Fall Restrictions Weight Bearing Restrictions: Yes LLE Weight Bearing: Weight bearing as tolerated      Mobility Bed Mobility               General bed mobility comments: not assessed, pt in recliner at beginning and end of session    Transfers Overall transfer level: Needs assistance   Transfers: Sit to/from Stand Sit to Stand: Min guard;Min  assist         General transfer comment: MIN GUARD from recliner and MIN A from toilet d/t low toilet height    Balance Overall balance assessment: Needs assistance Sitting-balance support: No upper extremity supported;Feet supported Sitting balance-Leahy Scale: Good Sitting balance - Comments: good sitting balance reaching outside BOS   Standing balance support: Bilateral upper extremity supported;During functional activity Standing balance-Leahy Scale: Fair Standing balance comment: MIN GUARD for functional mobility with b/l UE support from RW                           ADL either performed or assessed with clinical judgement   ADL Overall ADL's : Needs assistance/impaired     Grooming: Wash/dry hands;Min guard;Standing           Upper Body Dressing : Min guard;Standing Upper Body Dressing Details (indicate cue type and reason): to doff posterior gown Lower Body Dressing: Min guard;Sit to/from stand;With adaptive equipment Lower Body Dressing Details (indicate cue type and reason): to don/doff socks with sock aide and to don/doff underwear Toilet Transfer: Minimal assistance;Ambulation;Grab bars;RW Statistician Details (indicate cue type and reason): MIN A required d/t low toilet height         Functional mobility during ADLs: Min guard;Rolling walker       Vision Baseline Vision/History: Wears glasses              Pertinent Vitals/Pain Pain Assessment: No/denies pain        Extremity/Trunk Assessment Upper Extremity Assessment Upper Extremity Assessment: Overall WFL for tasks assessed   Lower Extremity  Assessment Lower Extremity Assessment: Generalized weakness;Defer to PT evaluation   Cervical / Trunk Assessment Cervical / Trunk Assessment: Normal   Communication Communication Communication: No difficulties   Cognition Arousal/Alertness: Awake/alert Behavior During Therapy: WFL for tasks assessed/performed Overall Cognitive Status:  Within Functional Limits for tasks assessed                                        Exercises Other Exercises Other Exercises: Pt instructed in polar care mgt, falls prevention strategies, home/routines modifications, and DME/AE for LB bathing and dressing tasks; handout provided.        Home Living Family/patient expects to be discharged to:: Private residence Living Arrangements: Spouse/significant other Available Help at Discharge: Family Type of Home: Other(Comment) (RV) Home Access: Stairs to enter Entrance Stairs-Number of Steps: 4 Entrance Stairs-Rails: Left                 Home Equipment: Cane - single point;Walker - 2 wheels;Bedside commode;Adaptive equipment;Toilet riser Adaptive Equipment: Reacher        Prior Functioning/Environment          Comments: At baseline, pt is independent with ADLs/IADLs and functional mobility (without AD)        OT Problem List: Impaired balance (sitting and/or standing);Decreased strength;Decreased knowledge of use of DME or AE      OT Treatment/Interventions: Self-care/ADL training;Therapeutic exercise;DME and/or AE instruction;Therapeutic activities;Patient/family education;Balance training    OT Goals(Current goals can be found in the care plan section) Acute Rehab OT Goals Patient Stated Goal: to go home OT Goal Formulation: With patient Time For Goal Achievement: 12/04/20 Potential to Achieve Goals: Good ADL Goals Pt Will Perform Grooming: with modified independence;standing Pt Will Perform Lower Body Dressing: with modified independence;sit to/from stand;with adaptive equipment Pt Will Transfer to Toilet: with modified independence;ambulating;regular height toilet  OT Frequency: Min 1X/week    AM-PAC OT "6 Clicks" Daily Activity     Outcome Measure Help from another person eating meals?: None Help from another person taking care of personal grooming?: A Little Help from another person toileting,  which includes using toliet, bedpan, or urinal?: A Little Help from another person bathing (including washing, rinsing, drying)?: A Little Help from another person to put on and taking off regular upper body clothing?: None Help from another person to put on and taking off regular lower body clothing?: A Little 6 Click Score: 20   End of Session Equipment Utilized During Treatment: Gait belt;Rolling walker Nurse Communication: Mobility status  Activity Tolerance: Patient tolerated treatment well Patient left: in chair;with call bell/phone within reach;with chair alarm set;with family/visitor present  OT Visit Diagnosis: Unsteadiness on feet (R26.81)                Time: 4081-4481 OT Time Calculation (min): 30 min Charges:  OT General Charges $OT Visit: 1 Visit OT Evaluation $OT Eval Moderate Complexity: 1 Mod OT Treatments $Self Care/Home Management : 23-37 mins  Matthew Folks, OTR/L [facility] [phone]

## 2020-11-20 NOTE — Anesthesia Postprocedure Evaluation (Signed)
Anesthesia Post Note  Patient: Kristopher Oneal  Procedure(s) Performed: COMPUTER ASSISTED TOTAL KNEE ARTHROPLASTY (Left: Knee)  Patient location during evaluation: Nursing Unit Anesthesia Type: Spinal Level of consciousness: oriented and awake and alert Pain management: pain level controlled Vital Signs Assessment: post-procedure vital signs reviewed and stable Respiratory status: spontaneous breathing and respiratory function stable Cardiovascular status: blood pressure returned to baseline and stable Postop Assessment: no headache, no backache, no apparent nausea or vomiting and patient able to bend at knees Anesthetic complications: no   No notable events documented.   Last Vitals:  Vitals:   11/20/20 0438 11/20/20 0804  BP: 96/70 109/79  Pulse: 78 74  Resp: 17 16  Temp: 36.7 C 36.4 C  SpO2: 91% 96%    Last Pain:  Vitals:   11/19/20 1910  TempSrc:   PainSc: 4                  Dayanis Bergquist

## 2020-11-20 NOTE — Progress Notes (Signed)
Met with the patient and his wife in the room,  He has a RW and 3 in 1 at home He [place] set up with [place] for [facility] services, he has transportation and can afford his meds No additional needs

## 2020-11-20 NOTE — Progress Notes (Signed)
  Subjective: 1 Day Post-Op Procedure(s) (LRB): COMPUTER ASSISTED TOTAL KNEE ARTHROPLASTY (Left) Patient reports pain as minimal despite not taking any pain medication. Patient is well, and has had no acute complaints or problems Plan is to go Home after hospital stay. Negative for chest pain and shortness of breath Fever: no Gastrointestinal: negative for nausea and vomiting.  Patient has not had a bowel movement.  Objective: Vital signs in last 24 hours: Temp:  [97 F (36.1 C)-98.7 F (37.1 C)] 97.6 F (36.4 C) (08/02 0804) Pulse Rate:  [74-114] 74 (08/02 0804) Resp:  [11-21] 16 (08/02 0804) BP: (96-153)/(61-91) 109/79 (08/02 0804) SpO2:  [91 %-99 %] 96 % (08/02 0804) Weight:  [122.5 kg] 122.5 kg (08/01 1039)  Intake/Output from previous day:  Intake/Output Summary (Last 24 hours) at 11/20/2020 0842 Last data filed at 11/20/2020 0458 Gross per 24 hour  Intake 3600 ml  Output 1647 ml  Net 1953 ml    Intake/Output this shift: No intake/output data recorded.  Labs: Recent Labs    11/19/20 1100  HGB 15.3   Recent Labs    11/19/20 1100  HCT 45.0   Recent Labs    11/19/20 1100  NA 140  K 4.2  CL 106  BUN 24*  CREATININE 1.00  GLUCOSE 226*   No results for input(s): LABPT, INR in the last 72 hours.   EXAM General - Patient is Alert, Appropriate, and Oriented Extremity - Neurovascular intact Dorsiflexion/Plantar flexion intact Compartment soft Dressing/Incision -Prevena in place and working. Motor Function - intact, moving foot and toes well on exam. Able to perform independent SLR.  Cardiovascular- Regular rate and rhythm, no murmurs/rubs/gallops Respiratory- Lungs clear to auscultation bilaterally Gastrointestinal- soft, nontender, and active bowel sounds   Assessment/Plan: 1 Day Post-Op Procedure(s) (LRB): COMPUTER ASSISTED TOTAL KNEE ARTHROPLASTY (Left) Active Problems:   Total knee replacement status  Estimated body mass index is 34.67 kg/m as  calculated from the following:   Height as of this encounter: 6\' 2"  (1.88 m).   Weight as of this encounter: 122.5 kg. Advance diet Up with therapy Discharge home with home health pending completion of therapy goals.     Post-op dressing removed.   DVT Prophylaxis - Lovenox, Ted hose, and foot pumps Weight-Bearing as tolerated to left leg  , PA-C [facility] 11/20/2020, 8:42 AM

## 2020-11-20 NOTE — Discharge Summary (Signed)
Physician Discharge Summary  Patient ID: Kristopher Oneal MRN: [id number] DOB/AGE: November 08, 1950 70 y.o.  Admit date: 11/19/2020 Discharge date: 11/20/2020  Admission Diagnoses:  Total knee replacement status [Z96.659]  Surgeries:Procedure(s): Left total knee arthroplasty using computer-assisted navigation   SURGEON:  Jena Gauss. M.D.   ASSISTANT: Baldwin Jamaica, PA-C (present and scrubbed throughout the case, critical for assistance with exposure, retraction, instrumentation, and closure)   ANESTHESIA: spinal   ESTIMATED BLOOD LOSS: 50 mL   FLUIDS REPLACED: 1500 mL of crystalloid             250 ml of colloid   TOURNIQUET TIME: 93 minutes   DRAINS: Prevena wound VAC   SOFT TISSUE RELEASES: Anterior cruciate ligament, posterior cruciate ligament, deep medial collateral ligament, patellofemoral ligament   IMPLANTS UTILIZED: DePuy Attune size 6 posterior stabilized femoral component (cemented), size 8 rotating platform tibial component (cemented), 41 mm medialized dome patella (cemented), and a 5 mm stabilized rotating platform polyethylene insert.  Discharge Diagnoses: Patient Active Problem List   Diagnosis Date Noted   Total knee replacement status 11/19/2020   Primary osteoarthritis of left knee 03/31/2020   Status post total right knee replacement 01/18/2020   Atrial fibrillation and flutter (HCC) 07/09/2018   DM type 2 with diabetic mixed hyperlipidemia (HCC) 02/23/2018   B12 deficiency 07/17/2016   Benign essential hypertension 07/17/2016   Chronic painful diabetic neuropathy (HCC) 07/17/2016   Hyperlipidemia, mixed 07/17/2016   Symptomatic PVCs 07/17/2016    Past Medical History:  Diagnosis Date   Anxiety    Arthritis    Diabetes mellitus without complication (HCC)    type 2   Dysrhythmia    PVC   Heart murmur    Hypertension      Transfusion:    Consultants (if any):   Discharged Condition: Improved  Hospital Course: Kristopher Oneal is an 70 y.o. male who was  admitted 11/19/2020 with a diagnosis of left knee osteoarthritis and went to the operating room on 11/19/2020 and underwent left total knee arthoplasty. The patient received perioperative antibiotics for prophylaxis (see below). The patient tolerated the procedure well and was transported to PACU in stable condition. After meeting PACU criteria, the patient was subsequently transferred to the Orthopaedics/Rehabilitation unit.   The patient received DVT prophylaxis in the form of early mobilization, Lovenox, Foot Pumps, and TED hose. A sacral pad had been placed and heels were elevated off of the bed with rolled towels in order to protect skin integrity. Foley catheter was discontinued on postoperative day #0. The surgical incision was healing well without signs of infection.  Physical therapy was initiated postoperatively for transfers, gait training, and strengthening. Occupational therapy was initiated for activities of daily living and evaluation for assisted devices. Rehabilitation goals were reviewed in detail with the patient. The patient made steady progress with physical therapy and physical therapy recommended discharge to Home.   The patient achieved the preliminary goals of this hospitalization and was felt to be medically and orthopaedically appropriate for discharge.  He was given perioperative antibiotics:  Anti-infectives (From admission, onward)    Start     Dose/Rate Route Frequency Ordered Stop   11/19/20 1800  ceFAZolin (ANCEF) IVPB 2g/100 mL premix        2 g 200 mL/hr over 30 Minutes Intravenous Every 6 hours 11/19/20 1518 11/20/20 0220   11/19/20 1051  ceFAZolin (ANCEF) 2-4 GM/100ML-% IVPB       Note to Pharmacy: Christene Slates   : cabinet override  11/19/20 1051 11/20/20 0136   11/19/20 0600  ceFAZolin (ANCEF) IVPB 2g/100 mL premix        2 g 200 mL/hr over 30 Minutes Intravenous On call to O.R. 11/19/20 0140 11/19/20 1150     .  Recent vital signs:  Vitals:    11/20/20 0438 11/20/20 0804  BP: 96/70 109/79  Pulse: 78 74  Resp: 17 16  Temp: 98 F (36.7 C) 97.6 F (36.4 C)  SpO2: 91% 96%    Recent laboratory studies:  Recent Labs    11/19/20 1100  HGB 15.3  HCT 45.0  K 4.2  CL 106  BUN 24*  CREATININE 1.00  GLUCOSE 226*    Diagnostic Studies: DG Knee Left Port  Result Date: 11/19/2020 CLINICAL DATA:  Postop left total knee arthroplasty EXAM: PORTABLE LEFT KNEE - 1-2 VIEW COMPARISON:  None. FINDINGS: Frontal and cross-table lateral views of the left knee demonstrate 3 component left knee arthroplasty in the expected position without signs of acute complication. Postsurgical changes are seen within the soft tissues. Midline skin staples are noted. IMPRESSION: 1. Unremarkable left knee arthroplasty. Electronically Signed   By: Sharlet Salina M.D.   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 Additional history provided by scanning technologist: Patient reports 3 years of progressively worsening left-sided hearing loss. EXAM: MRI HEAD WITHOUT AND WITH CONTRAST TECHNIQUE: Multiplanar, multiecho pulse sequences of the brain and surrounding structures were obtained without and with intravenous contrast. CONTRAST:  74mL GADAVIST GADOBUTROL 1 MMOL/ML IV SOLN COMPARISON:  No pertinent prior exams available for comparison. FINDINGS: Brain: Mild generalized cerebral atrophy. Mild multifocal T2/FLAIR hyperintensity within the cerebral Mcconico matter and pons, nonspecific but compatible with chronic small vessel ischemic disease. A chronic microhemorrhage is questioned within the right frontal lobe (series 11, image 43). No evidence of an intracranial mass. Specifically, no disc cerebellopontine angle or internal auditory canal mass is demonstrated. Unremarkable appearance of the seventh and  eighth cranial nerves bilaterally. There is no acute infarct. No evidence of an intracranial mass. No extra-axial fluid collection. No midline shift. No abnormal intracranial enhancement. Vascular: Expected proximal arterial flow voids. Small right occipital lobe developmental venous anomaly (an incidental anatomic variant). Skull and upper cervical spine: No focal suspicious marrow lesion. Sinuses/Orbits: Visualized orbits show no acute finding. Trace diffuse paranasal sinus mucosal thickening. Small right maxillary sinus mucous retention cyst. IMPRESSION: No evidence of acute intracranial abnormality. No cerebellopontine angle or internal auditory canal mass. Mild chronic small vessel ischemic changes within the cerebral Ostrum matter and pons. Mild generalized cerebral atrophy. Mild paranasal sinus disease, as described. Electronically Signed   By: Jackey Loge DO   On: 11/13/2020 07:51    Discharge Medications:   Allergies as of 11/20/2020   No Known Allergies      Medication List     STOP taking these medications    aspirin EC 325 MG tablet       TAKE these medications    Advanced Diabetic Multivitamin Tabs Take 1 tablet by mouth daily.   celecoxib 200 MG capsule Commonly known as: CELEBREX Take 1 capsule (200 mg total) by mouth 2 (two) times daily.   diltiazem 240 MG 24 hr capsule Commonly known as: CARDIZEM CD Take 240 mg by mouth every evening.   enoxaparin 40 MG/0.4ML injection Commonly known as: LOVENOX Inject 0.4 mLs (  40 mg total) into the skin daily for 14 days.   fenofibrate 160 MG tablet Take 160 mg by mouth daily.   fluticasone 50 MCG/ACT nasal spray Commonly known as: FLONASE Place 2 sprays into both nostrils daily as needed for allergies or rhinitis.   gabapentin 300 MG capsule Commonly known as: NEURONTIN Take 900 mg by mouth at bedtime.   glipiZIDE 5 MG tablet Commonly known as: GLUCOTROL Take 5 mg by mouth 2 (two) times daily.   HumuLIN 70/30 (70-30)  100 UNIT/ML injection Generic drug: insulin NPH-regular Human Inject 35-45 Units into the skin See admin instructions. Inject 45 units into the skin in the morning and 35 units in the evening   lovastatin 40 MG tablet Commonly known as: MEVACOR Take 40 mg by mouth daily with supper.   metFORMIN 1000 MG tablet Commonly known as: GLUCOPHAGE Take 1,000 mg by mouth 2 (two) times daily with a meal.   metoprolol succinate 100 MG 24 hr tablet Commonly known as: TOPROL-XL Take 100 mg by mouth at bedtime.   traMADol 50 MG tablet Commonly known as: ULTRAM Take 1 tablet (50 mg total) by mouth every 6 (six) hours as needed for moderate pain.   triamterene-hydrochlorothiazide 37.5-25 MG tablet Commonly known as: MAXZIDE-25 Take 1 tablet by mouth at bedtime.   venlafaxine XR 37.5 MG 24 hr capsule Commonly known as: EFFEXOR-XR Take 37.5 mg by mouth daily with breakfast.   vitamin B-12 500 MCG tablet Commonly known as: CYANOCOBALAMIN Take 500 mcg by mouth daily.   vitamin C 1000 MG tablet Take 1,000 mg by mouth daily.               Durable Medical Equipment  (From admission, onward)           Start     Ordered   11/19/20 1519  DME Walker rolling  Once       Question:  Patient needs a walker to treat with the following condition  Answer:  Total knee replacement status   11/19/20 1518   11/19/20 1519  DME Bedside commode  Once       Question:  Patient needs a bedside commode to treat with the following condition  Answer:  Total knee replacement status   11/19/20 1518            Disposition: Home with home health PT     Follow-up Information     Tera Partridge, PA Follow up on 12/03/2020.   Specialty: Physician Assistant Why: at 9:15am Contact information: [address] [place] [address] [phone]         Donato Heinz, MD Follow up on 01/01/2021.   Specialty: Orthopedic Surgery Why: at 10:45am Contact information: [phone]  [facility] [address] [facility] [place] [address] [phone]                  Lasandra Beech, PA-C 11/20/2020, 8:47 AM

## 2020-11-21 MED FILL — Bupivacaine HCl Inj 0.25%: INTRAMUSCULAR | Qty: 60 | Status: AC

## 2021-04-12 ENCOUNTER — Encounter
Admission: RE | Admit: 2021-04-12 | Discharge: 2021-04-12 | Disposition: A | Discharge status: Home / Self Care | Payer: Medicare Other | Source: Ambulatory Visit | Attending: Orthopedic Surgery | Admitting: Orthopedic Surgery

## 2021-04-12 ENCOUNTER — Other Ambulatory Visit: Payer: Self-pay

## 2021-04-12 NOTE — Patient Instructions (Signed)
Your procedure is scheduled on: 04/16/21 Report to DAY SURGERY DEPARTMENT LOCATED ON 2ND FLOOR MEDICAL MALL ENTRANCE. To find out your arrival time please call [phone] between 1PM - 3PM on 04/12/21.  Remember: Instructions that are not followed completely may result in serious medical risk, up to and including death, or upon the discretion of your surgeon and anesthesiologist your surgery may need to be rescheduled.     _X__ 1. Do not eat food after midnight the night before your procedure.                 No gum chewing or hard candies. You may drink clear liquids up to 2 hours                 before you are scheduled to arrive for your surgery- DO not drink clear                 liquids within 2 hours of the start of your surgery.                 Clear Liquids include:  water, apple juice without pulp, clear carbohydrate                 drink such as Clearfast or Gatorade, Black Coffee or Tea (Do not add                 anything to coffee or tea). Diabetics water only  __X__2.  On the morning of surgery brush your teeth with toothpaste and water, you                 may rinse your mouth with mouthwash if you wish.  Do not swallow any              toothpaste of mouthwash.     _X__ 3.  No Alcohol for 24 hours before or after surgery.   _X__ 4.  Do Not Smoke or use e-cigarettes For 24 Hours Prior to Your Surgery.                 Do not use any chewable tobacco products for at least 6 hours prior to                 surgery.  ____  5.  Bring all medications with you on the day of surgery if instructed.   __X__  6.  Notify your doctor if there is any change in your medical condition      (cold, fever, infections).     Do not wear jewelry, make-up, hairpins, clips or nail polish. Do not wear lotions, powders, or perfumes.  Do not shave 48 hours prior to surgery. Men may shave face and neck. Do not bring valuables to the hospital.    [facility] is not responsible for any belongings  or valuables.  Contacts, dentures/partials or body piercings may not be worn into surgery. Bring a case for your contacts, glasses or hearing aids, a denture cup will be supplied. Leave your suitcase in the car. After surgery it may be brought to your room. For patients admitted to the hospital, discharge time is determined by your treatment team.   Patients discharged the day of surgery will not be allowed to drive home.   Please read over the following fact sheets that you were given:    __X__ Take these medicines the morning of surgery with A SIP OF WATER:    1.  fenofibrate 160 MG tablet  2. venlafaxine XR (EFFEXOR-XR) 37.5 MG 24 hr capsule  3.   4.  5.  6.  ____ Fleet Enema (as directed)   ____ Use CHG Soap/SAGE wipes as directed  ____ Use inhalers on the day of surgery  ____ Stop metformin/Janumet/Farxiga 2 days prior to surgery    ____ Take 1/2 of usual insulin dose the night before surgery. No insulin the morning          of surgery.   ____ Stop Blood Thinners Coumadin/Plavix/Xarelto/Pleta/Pradaxa/Eliquis/Effient/Aspirin  on   Or contact your Surgeon, Cardiologist or Medical Doctor regarding  ability to stop your blood thinners  __X__ Stop Anti-inflammatories 7 days before surgery such as Advil, Ibuprofen, Motrin,  BC or Goodies Powder, Naprosyn, Naproxen, Aleve   __X__ Stop all herbal supplements, fish oil or vitamin E until after surgery.    ____ Bring C-Pap to the hospital.    Shower with "Gold" bar Dial soap daily thru the day of surgery.  May use Tylenol if needed for pain.  How to Use an Incentive Spirometer An incentive spirometer is a tool that measures how well you are filling your lungs with each breath. Learning to take long, deep breaths using this tool can help you keep your lungs clear and active. This may help to reverse or lessen your chance of developing breathing (pulmonary) problems, especially infection. You may be asked to use a  spirometer: After a surgery. If you have a lung problem or a history of smoking. After a long period of time when you have been unable to move or be active. If the spirometer includes an indicator to show the highest number that you have reached, your health care provider or respiratory therapist will help you set a goal. Keep a log of your progress as told by your health care provider. What are the risks? Breathing too quickly may cause dizziness or cause you to pass out. Take your time so you do not get dizzy or light-headed. If you are in pain, you may need to take pain medicine before doing incentive spirometry. It is harder to take a deep breath if you are having pain. How to use your incentive spirometer  Sit up on the edge of your bed or on a chair. Hold the incentive spirometer so that it is in an upright position. Before you use the spirometer, breathe out normally. Place the mouthpiece in your mouth. Make sure your lips are closed tightly around it. Breathe in slowly and as deeply as you can through your mouth, causing the piston or the ball to rise toward the top of the chamber. Hold your breath for 3-5 seconds, or for as long as possible. If the spirometer includes a coach indicator, use this to guide you in breathing. Slow down your breathing if the indicator goes above the marked areas. Remove the mouthpiece from your mouth and breathe out normally. The piston or ball will return to the bottom of the chamber. Rest for a few seconds, then repeat the steps 10 or more times. Take your time and take a few normal breaths between deep breaths so that you do not get dizzy or light-headed. Do this every 1-2 hours when you are awake. If the spirometer includes a goal marker to show the highest number you have reached (best effort), use this as a goal to work toward during each repetition. After each set of 10 deep breaths, cough a few times. This will help  to make sure that your lungs are  clear. If you have an incision on your chest or abdomen from surgery, place a pillow or a rolled-up towel firmly against the incision when you cough. This can help to reduce pain while taking deep breaths and coughing. General tips When you are able to get out of bed: Walk around often. Continue to take deep breaths and cough in order to clear your lungs. Keep using the incentive spirometer until your health care provider says it is okay to stop using it. If you have been in the hospital, you may be told to keep using the spirometer at home. Contact a health care provider if: You are having difficulty using the spirometer. You have trouble using the spirometer as often as instructed. Your pain medicine is not giving enough relief for you to use the spirometer as told. You have a fever. Get help right away if: You develop shortness of breath. You develop a cough with bloody mucus from the lungs. You have fluid or blood coming from an incision site after you cough. Summary An incentive spirometer is a tool that can help you learn to take long, deep breaths to keep your lungs clear and active. You may be asked to use a spirometer after a surgery, if you have a lung problem or a history of smoking, or if you have been inactive for a long period of time. Use your incentive spirometer as instructed every 1-2 hours while you are awake. If you have an incision on your chest or abdomen, place a pillow or a rolled-up towel firmly against your incision when you cough. This will help to reduce pain. Get help right away if you have shortness of breath, you cough up bloody mucus, or blood comes from your incision when you cough. This information is not intended to replace advice given to you by your health care provider. Make sure you discuss any questions you have with your health care provider. Document Revised: 06/27/2019 Document Reviewed: 06/27/2019 Elsevier Patient Education  2022 [employer].

## 2021-04-15 NOTE — H&P (Signed)
ORTHOPAEDIC HISTORY & PHYSICAL  Kristopher Oneal, [place] - 04/12/2021 8:15 AM EST Formatting of this note is different from the original. [facility] Chief Complaint:   Chief Complaint  Patient presents with   Knee Pain  RECHECK LEFT KNEE   History of Present Illness:   Kristopher Oneal is a 70 y.o. male that presents to clinic today for evaluation and management of left knee pain. Of note patient is status post left total knee arthroplasty by Dr. Marry Guan on 11/19/2020. He was seen on 02/14/2021 at which time he was doing well without any significant complaints.  Patient states that a week ago he was stepping down the stairs of his RV with the left leg and felt a pop. Denies any other injury to the leg, denies any falls. He has been ambulating with a cane since that time. He states his biggest problem is getting up from a seated position, which requires him to use his arms. He has pain with weightbearing.   Past Medical, Surgical, Family, Social History, Allergies, Medications:  Past Medical History:  Past Medical History:  Diagnosis Date   Benign essential hypertension 07/17/2016   Chronic atrial fibrillation (CMS-HCC)   Chronic painful diabetic neuropathy (CMS-HCC) 07/17/2016   Controlled type 2 diabetes mellitus with complication, with long-term current use of insulin (CMS-HCC) 07/17/2016   Hyperlipidemia, mixed 07/17/2016   Symptomatic PVCs 07/17/2016   Past Surgical History:  Past Surgical History:  Procedure Laterality Date   LAMINECTOMY LUMBAR SPINE 1993   Right total knee arthroplasty using computer-assisted navigation 01/02/2020  Dr Marry Guan   Left total knee arthroplasty using computer-assisted navigation 11/19/2020  Dr Marry Guan   Current Medications:  Current Outpatient Medications  Medication Sig Dispense Refill   ascorbic acid, vitamin C, (VITAMIN C) 1000 MG tablet Take 1,000 mg by mouth once daily Per patient   celecoxib (CELEBREX)  200 MG capsule Take 200 mg by mouth 2 (two) times daily   diltiazem (CARDIZEM CD) 240 MG CD capsule TAKE 1 CAPSULE BY MOUTH IN THE EVENING 90 capsule 3   fenofibrate 160 MG tablet TAKE 1 TABLET BY MOUTH ONCE DAILY (Patient taking differently: Take 160 mg by mouth once daily) 90 tablet 3   flash glucose sensor (FREESTYLE LIBRE 14 DAY SENSOR) kit Use 1 kit every 14 (fourteen) days 2 kit 12   fluticasone propionate (FLONASE) 50 mcg/actuation nasal spray Place 1 spray into both nostrils once daily as needed   gabapentin (NEURONTIN) 300 MG capsule TAKE 3 CAPSULES BY MOUTH AT NIGHT 270 capsule 1   insulin NPH-REGULAR (HUMULIN 70/30 PEN) 100 unit/mL (70-30) pen injector Inject 35 Units subcutaneously nightly Novolin 70/30, 45 units in the morning before breakfast and 35 units   lovastatin (MEVACOR) 40 MG tablet TAKE 1 TABLET BY MOUTH DAILY WITH DINNER 90 tablet 1   metFORMIN (GLUCOPHAGE) 1000 MG tablet Take 1 tablet (1,000 mg total) by mouth 2 (two) times daily Take 1 tablet by mouth (1000 mg total) twice daily 180 tablet 3   metoprolol succinate (TOPROL-XL) 100 MG XL tablet TAKE 1 TABLET BY MOUTH ONCE DAILY (Patient taking differently: Take 100 mg by mouth once daily) 90 tablet 3   multivitamin tablet Take 1 tablet by mouth once daily For diabetics   pen needle, diabetic 32 gauge x 5/32" Ndle Use 1 each 2 (two) times daily 100 each 4   venlafaxine (EFFEXOR-XR) 37.5 MG XR capsule Take 1 capsule (37.5 mg total) by mouth  once daily 90 capsule 3   glipiZIDE (GLUCOTROL) 5 MG tablet Take 1 tablet (5 mg total) by mouth 2 (two) times daily for 90 days 180 tablet 3   No current facility-administered medications for this visit.   Allergies: No Known Allergies  Social History:  Social History   Socioeconomic History   Marital status: Married  Spouse name: Helene Kelp   Number of children: 1   Years of education: 11  Occupational History   Occupation: Animator- IT / Computers  Tobacco Use   Smoking status:  Former  Types: Cigarettes  Quit date: 1980  Years since quitting: 43.0   Smokeless tobacco: Never  Vaping Use   Vaping Use: Never used  Substance and Sexual Activity   Alcohol use: No   Drug use: Never   Sexual activity: Defer  Partners: Female   Family History:  Family History  Problem Relation Age of Onset   Alzheimer's disease Mother   No Known Problems Father   Review of Systems:   A 10+ ROS was performed, reviewed, and the pertinent orthopaedic findings are documented in the HPI.   Physical Examination:   BP 130/76 (BP Location: Left upper arm, Patient Position: Sitting, BP Cuff Size: Large Adult)   Ht 188 cm (6' 2")   Wt (!) 124 kg (273 lb 6.4 oz)   BMI 35.10 kg/m   Patient is a well-developed, well-nourished male in no acute distress. Patient has normal mood and affect. Patient is alert and oriented to person, place, and time. Pupils are equal and round with synchronous movement. No injected sclera. There is no palpable lymphadenopathy. Respirations are normal, without noticeable retractions.   Cardiovascular: Regular rate and rhythm, no murmur/rub/gallops. No carotid bruits. Distal pulses palpable.  Respiratory: Lungs clear to auscultation bilaterally.  Patient ambulates unassisted. Patient able to actively flex the left hip. No pain with passive internal and external rotation of the hip.   Mild edema noted over the left knee. No effusion. Nontender over the patella. Palpable defect of the proximal patella noted. Able to actively extend the knee, with an extension lag of approximately 15 degrees. Flexion not actively assessed, but upon entering room patient sitting with the knee at approximately 90 degrees.  The patient is able to plantarflex and dorsiflex the left ankle.   Patient is able to flex and extend the left hallux. Sensation is intact to light touch over the saphenous, lateral sural cutaneous, and deep fibular nerve distributions, slightly decreased to light  touch over the superficial fibular distribution.   Tests Performed/Reviewed:   Anteroposterior, lateral, and sunrise views of the left knee were obtained. Images reveal a total knee arthroplasty. Transverse fracture noted of the proximal third of the patella with approximately 7 mm separating proximal and distal fragments. Tibial and femoral components appear unchanged from previous.   I personally reviewed and visualized the imaging studies if available. I additionally personally interpreted any radiographs taken during today's visit.  Impression:   ICD-10-CM  1. S/P total knee replacement using cement, left [id number]   Plan:   -s/p left total knee arthroplasty on 11/19/2020 by Dr. Marry Guan -periprosthetic fracture around patellar component Knee immobilizer applied. Patient is to keep knee immobilizer on at all times. Will speak with attending about definitive treatment. Discussed likely need for surgical intervention of the patella. This visit will serve as the H&P if we move forward with surgery.  Patient is to follow-up pending consultation with Dr Marry Guan. *Addendum: After speaking with Dr. Marry Guan, patient  will need to undergo ORIF of the left patella. Patient contacted and notified. We will plan to move forward with surgery on 04/16/2021. Patient understands and agrees.*  Contact our office with any questions or concerns. Follow up as indicated, or sooner should any new problems arise, if conditions worsen, or if they are otherwise concerned.   Kristopher Fudge, PA-C [facility] [place], [place] [address] Phone: [phone]  This note was generated in part with voice recognition software and I apologize for any typographical errors that were not detected and corrected.  Electronically signed by Kristopher Fudge, PA at 04/12/2021 2:26 PM EST

## 2021-04-16 ENCOUNTER — Inpatient Hospital Stay: Payer: Medicare Other | Admitting: Certified Registered Nurse Anesthetist

## 2021-04-16 ENCOUNTER — Encounter
Admission: RE | Disposition: A | Discharge status: Home / Self Care | Payer: Self-pay | Source: Ambulatory Visit | Attending: Orthopedic Surgery

## 2021-04-16 ENCOUNTER — Inpatient Hospital Stay: Payer: Medicare Other

## 2021-04-16 ENCOUNTER — Inpatient Hospital Stay
Admission: RE | Admit: 2021-04-16 | Discharge: 2021-04-17 | DRG: 515 | Disposition: A | Discharge status: Home Health | Payer: Medicare Other | Source: Ambulatory Visit | Attending: Orthopedic Surgery | Admitting: Orthopedic Surgery

## 2021-04-16 ENCOUNTER — Encounter: Payer: Self-pay | Admitting: Orthopedic Surgery

## 2021-04-16 DIAGNOSIS — Z96651 Presence of right artificial knee joint: Secondary | ICD-10-CM | POA: Diagnosis present

## 2021-04-16 DIAGNOSIS — Z79899 Other long term (current) drug therapy: Secondary | ICD-10-CM

## 2021-04-16 DIAGNOSIS — Z20822 Contact with and (suspected) exposure to covid-19: Secondary | ICD-10-CM | POA: Diagnosis present

## 2021-04-16 DIAGNOSIS — Z791 Long term (current) use of non-steroidal anti-inflammatories (NSAID): Secondary | ICD-10-CM | POA: Diagnosis not present

## 2021-04-16 DIAGNOSIS — E1140 Type 2 diabetes mellitus with diabetic neuropathy, unspecified: Secondary | ICD-10-CM | POA: Diagnosis present

## 2021-04-16 DIAGNOSIS — S82002A Unspecified fracture of left patella, initial encounter for closed fracture: Principal | ICD-10-CM | POA: Diagnosis present

## 2021-04-16 DIAGNOSIS — U071 COVID-19: Secondary | ICD-10-CM | POA: Diagnosis present

## 2021-04-16 DIAGNOSIS — S82009A Unspecified fracture of unspecified patella, initial encounter for closed fracture: Secondary | ICD-10-CM | POA: Diagnosis present

## 2021-04-16 DIAGNOSIS — M9712XA Periprosthetic fracture around internal prosthetic left knee joint, initial encounter: Secondary | ICD-10-CM | POA: Diagnosis present

## 2021-04-16 DIAGNOSIS — Z419 Encounter for procedure for purposes other than remedying health state, unspecified: Secondary | ICD-10-CM

## 2021-04-16 DIAGNOSIS — S82032A Displaced transverse fracture of left patella, initial encounter for closed fracture: Secondary | ICD-10-CM

## 2021-04-16 DIAGNOSIS — I10 Essential (primary) hypertension: Secondary | ICD-10-CM | POA: Diagnosis present

## 2021-04-16 DIAGNOSIS — X500XXA Overexertion from strenuous movement or load, initial encounter: Secondary | ICD-10-CM | POA: Diagnosis not present

## 2021-04-16 DIAGNOSIS — Z7984 Long term (current) use of oral hypoglycemic drugs: Secondary | ICD-10-CM

## 2021-04-16 DIAGNOSIS — I482 Chronic atrial fibrillation, unspecified: Secondary | ICD-10-CM | POA: Diagnosis present

## 2021-04-16 DIAGNOSIS — E782 Mixed hyperlipidemia: Secondary | ICD-10-CM | POA: Diagnosis present

## 2021-04-16 DIAGNOSIS — Z820 Family history of epilepsy and other diseases of the nervous system: Secondary | ICD-10-CM

## 2021-04-16 DIAGNOSIS — Z87891 Personal history of nicotine dependence: Secondary | ICD-10-CM | POA: Diagnosis not present

## 2021-04-16 DIAGNOSIS — E1169 Type 2 diabetes mellitus with other specified complication: Secondary | ICD-10-CM | POA: Diagnosis present

## 2021-04-16 DIAGNOSIS — Z794 Long term (current) use of insulin: Secondary | ICD-10-CM

## 2021-04-16 HISTORY — PX: ORIF PATELLA: SHX5033

## 2021-04-16 LAB — CBC WITH DIFFERENTIAL/PLATELET
Abs Immature Granulocytes: 0.08 10*3/uL — ABNORMAL HIGH (ref 0.00–0.07)
Basophils Absolute: 0.1 10*3/uL (ref 0.0–0.1)
Basophils Relative: 1 %
Eosinophils Absolute: 0.2 10*3/uL (ref 0.0–0.5)
Eosinophils Relative: 3 %
HCT: 43.9 % (ref 39.0–52.0)
Hemoglobin: 14.9 g/dL (ref 13.0–17.0)
Immature Granulocytes: 1 %
Lymphocytes Relative: 19 %
Lymphs Abs: 1.6 10*3/uL (ref 0.7–4.0)
MCH: 31.6 pg (ref 26.0–34.0)
MCHC: 33.9 g/dL (ref 30.0–36.0)
MCV: 93.2 fL (ref 80.0–100.0)
Monocytes Absolute: 1.3 10*3/uL — ABNORMAL HIGH (ref 0.1–1.0)
Monocytes Relative: 16 %
Neutro Abs: 5.2 10*3/uL (ref 1.7–7.7)
Neutrophils Relative %: 60 %
Platelets: 274 10*3/uL (ref 150–400)
RBC: 4.71 MIL/uL (ref 4.22–5.81)
RDW: 13.8 % (ref 11.5–15.5)
WBC: 8.5 10*3/uL (ref 4.0–10.5)
nRBC: 0 % (ref 0.0–0.2)

## 2021-04-16 LAB — COMPREHENSIVE METABOLIC PANEL
ALT: 33 U/L (ref 0–44)
AST: 25 U/L (ref 15–41)
Albumin: 3.8 g/dL (ref 3.5–5.0)
Alkaline Phosphatase: 49 U/L (ref 38–126)
Anion gap: 11 (ref 5–15)
BUN: 34 mg/dL — ABNORMAL HIGH (ref 8–23)
CO2: 23 mmol/L (ref 22–32)
Calcium: 9.3 mg/dL (ref 8.9–10.3)
Chloride: 102 mmol/L (ref 98–111)
Creatinine, Ser: 1.07 mg/dL (ref 0.61–1.24)
GFR, Estimated: >60 mL/min (ref >60)
Glucose, Bld: 268 mg/dL — ABNORMAL HIGH (ref 70–99)
Potassium: 4.2 mmol/L (ref 3.5–5.1)
Sodium: 136 mmol/L (ref 135–145)
Total Bilirubin: 0.4 mg/dL (ref 0.3–1.2)
Total Protein: 7.6 g/dL (ref 6.5–8.1)

## 2021-04-16 LAB — GLUCOSE, CAPILLARY
Glucose-Capillary: 246 mg/dL — ABNORMAL HIGH (ref 70–99)
Glucose-Capillary: 172 mg/dL — ABNORMAL HIGH (ref 70–99)
Glucose-Capillary: 256 mg/dL — ABNORMAL HIGH (ref 70–99)
Glucose-Capillary: 307 mg/dL — ABNORMAL HIGH (ref 70–99)

## 2021-04-16 LAB — SARS CORONAVIRUS 2 BY RT PCR (HOSPITAL ORDER, PERFORMED IN ~~LOC~~ HOSPITAL LAB): SARS Coronavirus 2: POSITIVE — AB

## 2021-04-16 SURGERY — OPEN REDUCTION INTERNAL FIXATION (ORIF) PATELLA
Anesthesia: Spinal | Site: Knee | Laterality: Left

## 2021-04-16 MED ORDER — DOCUSATE SODIUM 100 MG PO CAPS
100.0000 mg | ORAL_CAPSULE | Freq: Two times a day (BID) | ORAL | Status: DC
  Administered 2021-04-16 – 2021-04-17 (×2): 100 mg via ORAL
  Filled 2021-04-16 (×2): qty 1

## 2021-04-16 MED ORDER — BUPIVACAINE HCL (PF) 0.5 % IJ SOLN
INTRAMUSCULAR | Status: AC
  Filled 2021-04-16: qty 10

## 2021-04-16 MED ORDER — FAMOTIDINE 20 MG PO TABS
ORAL_TABLET | ORAL | Status: AC
  Administered 2021-04-16: 06:00:00 20 mg via ORAL
  Filled 2021-04-16: qty 1

## 2021-04-16 MED ORDER — ASPIRIN EC 325 MG PO TBEC
325.0000 mg | DELAYED_RELEASE_TABLET | Freq: Every day | ORAL | Status: DC
  Administered 2021-04-17: 08:00:00 325 mg via ORAL
  Filled 2021-04-16: qty 1

## 2021-04-16 MED ORDER — PHENYLEPHRINE HCL-NACL 20-0.9 MG/250ML-% IV SOLN
INTRAVENOUS | Status: DC | PRN
  Administered 2021-04-16: 50 ug/min via INTRAVENOUS

## 2021-04-16 MED ORDER — INSULIN ASPART 100 UNIT/ML IJ SOLN
0.0000 [IU] | Freq: Every day | INTRAMUSCULAR | Status: DC
  Administered 2021-04-16: 22:00:00 4 [IU] via SUBCUTANEOUS
  Filled 2021-04-16: qty 1

## 2021-04-16 MED ORDER — CHLORHEXIDINE GLUCONATE 0.12 % MT SOLN
OROMUCOSAL | Status: AC
  Administered 2021-04-16: 06:00:00 15 mL via OROMUCOSAL
  Filled 2021-04-16: qty 15

## 2021-04-16 MED ORDER — OXYCODONE HCL 5 MG PO TABS: 5.0000 mg | ORAL_TABLET | Freq: Once | ORAL | Status: DC | PRN

## 2021-04-16 MED ORDER — HYDROMORPHONE HCL 1 MG/ML IJ SOLN: 0.5000 mg | INTRAMUSCULAR | Status: DC | PRN

## 2021-04-16 MED ORDER — FLEET ENEMA 7-19 GM/118ML RE ENEM: 1.0000 | ENEMA | Freq: Once | RECTAL | Status: DC | PRN

## 2021-04-16 MED ORDER — ACETAMINOPHEN 10 MG/ML IV SOLN: 1000.0000 mg | Freq: Once | INTRAVENOUS | Status: DC | PRN

## 2021-04-16 MED ORDER — GABAPENTIN 300 MG PO CAPS
900.0000 mg | ORAL_CAPSULE | Freq: Every day | ORAL | Status: DC
  Administered 2021-04-16: 22:00:00 900 mg via ORAL
  Filled 2021-04-16: qty 3

## 2021-04-16 MED ORDER — INSULIN ASPART PROT & ASPART (70-30 MIX) 100 UNIT/ML ~~LOC~~ SUSP
45.0000 [IU] | Freq: Every day | SUBCUTANEOUS | Status: DC
  Administered 2021-04-17: 09:00:00 45 [IU] via SUBCUTANEOUS
  Filled 2021-04-16: qty 10

## 2021-04-16 MED ORDER — METOPROLOL SUCCINATE ER 50 MG PO TB24
100.0000 mg | ORAL_TABLET | Freq: Every day | ORAL | Status: DC
  Administered 2021-04-16 – 2021-04-17 (×2): 100 mg via ORAL
  Filled 2021-04-16: qty 2
  Filled 2021-04-16: qty 4

## 2021-04-16 MED ORDER — DROPERIDOL 2.5 MG/ML IJ SOLN
0.6250 mg | Freq: Once | INTRAMUSCULAR | Status: DC | PRN
  Filled 2021-04-16: qty 2

## 2021-04-16 MED ORDER — FENOFIBRATE 160 MG PO TABS
160.0000 mg | ORAL_TABLET | Freq: Every day | ORAL | Status: DC
  Administered 2021-04-17: 08:00:00 160 mg via ORAL
  Filled 2021-04-16: qty 1

## 2021-04-16 MED ORDER — METOPROLOL SUCCINATE ER 50 MG PO TB24
100.0000 mg | ORAL_TABLET | Freq: Every day | ORAL | Status: DC
  Administered 2021-04-16: 22:00:00 100 mg via ORAL
  Filled 2021-04-16: qty 2

## 2021-04-16 MED ORDER — GLYCOPYRROLATE 0.2 MG/ML IJ SOLN
INTRAMUSCULAR | Status: AC
  Filled 2021-04-16: qty 1

## 2021-04-16 MED ORDER — PROMETHAZINE HCL 25 MG/ML IJ SOLN: 6.2500 mg | INTRAMUSCULAR | Status: DC | PRN

## 2021-04-16 MED ORDER — ADVANCED DIABETIC MULTIVITAMIN PO TABS: 1.0000 | ORAL_TABLET | Freq: Every day | ORAL | Status: DC

## 2021-04-16 MED ORDER — METOCLOPRAMIDE HCL 10 MG PO TABS
10.0000 mg | ORAL_TABLET | Freq: Three times a day (TID) | ORAL | Status: DC
  Administered 2021-04-16 – 2021-04-17 (×3): 10 mg via ORAL
  Filled 2021-04-16 (×4): qty 1

## 2021-04-16 MED ORDER — PRAVASTATIN SODIUM 20 MG PO TABS
40.0000 mg | ORAL_TABLET | Freq: Every day | ORAL | Status: DC
  Administered 2021-04-16: 17:00:00 40 mg via ORAL
  Filled 2021-04-16: qty 2

## 2021-04-16 MED ORDER — GLIPIZIDE 5 MG PO TABS
5.0000 mg | ORAL_TABLET | Freq: Two times a day (BID) | ORAL | Status: DC
  Administered 2021-04-17: 08:00:00 5 mg via ORAL
  Filled 2021-04-16: qty 1

## 2021-04-16 MED ORDER — VENLAFAXINE HCL ER 37.5 MG PO CP24
37.5000 mg | ORAL_CAPSULE | Freq: Every day | ORAL | Status: DC
  Administered 2021-04-17: 08:00:00 37.5 mg via ORAL
  Filled 2021-04-16: qty 1

## 2021-04-16 MED ORDER — SODIUM CHLORIDE 0.9 % IV SOLN: INTRAVENOUS | Status: DC

## 2021-04-16 MED ORDER — VITAMIN B-12 1000 MCG PO TABS
1000.0000 ug | ORAL_TABLET | Freq: Every day | ORAL | Status: DC
  Administered 2021-04-17: 08:00:00 1000 ug via ORAL
  Filled 2021-04-16: qty 1

## 2021-04-16 MED ORDER — ADULT MULTIVITAMIN W/MINERALS CH
1.0000 | ORAL_TABLET | Freq: Every day | ORAL | Status: DC
Start: 2021-04-17 — End: 2021-04-17
  Administered 2021-04-17: 08:00:00 1 via ORAL
  Filled 2021-04-16: qty 1

## 2021-04-16 MED ORDER — ASCORBIC ACID 500 MG PO TABS
1000.0000 mg | ORAL_TABLET | Freq: Every day | ORAL | Status: DC
  Administered 2021-04-17: 08:00:00 1000 mg via ORAL
  Filled 2021-04-16: qty 2

## 2021-04-16 MED ORDER — PHENOL 1.4 % MT LIQD
1.0000 | OROMUCOSAL | Status: DC | PRN
  Filled 2021-04-16: qty 177

## 2021-04-16 MED ORDER — OXYCODONE HCL 5 MG/5ML PO SOLN: 5.0000 mg | Freq: Once | ORAL | Status: DC | PRN

## 2021-04-16 MED ORDER — TRIAMTERENE-HCTZ 37.5-25 MG PO TABS
1.0000 | ORAL_TABLET | Freq: Every day | ORAL | Status: DC
  Filled 2021-04-16 (×2): qty 1

## 2021-04-16 MED ORDER — FENTANYL CITRATE (PF) 100 MCG/2ML IJ SOLN
INTRAMUSCULAR | Status: DC | PRN
  Administered 2021-04-16: 50 ug via INTRAVENOUS
  Administered 2021-04-16 (×2): 25 ug via INTRAVENOUS

## 2021-04-16 MED ORDER — BUPIVACAINE HCL (PF) 0.5 % IJ SOLN
INTRAMUSCULAR | Status: DC | PRN
  Administered 2021-04-16: 3 mL via INTRATHECAL

## 2021-04-16 MED ORDER — TRAMADOL HCL 50 MG PO TABS
50.0000 mg | ORAL_TABLET | ORAL | Status: DC | PRN
Start: 2021-04-16 — End: 2021-04-17
  Administered 2021-04-16 – 2021-04-17 (×3): 100 mg via ORAL
  Filled 2021-04-16 (×3): qty 2

## 2021-04-16 MED ORDER — PROPOFOL 500 MG/50ML IV EMUL
INTRAVENOUS | Status: AC
  Filled 2021-04-16: qty 50

## 2021-04-16 MED ORDER — CHLORHEXIDINE GLUCONATE 0.12 % MT SOLN: 15.0000 mL | Freq: Once | OROMUCOSAL | Status: AC

## 2021-04-16 MED ORDER — PANTOPRAZOLE SODIUM 40 MG PO TBEC
40.0000 mg | DELAYED_RELEASE_TABLET | Freq: Two times a day (BID) | ORAL | Status: DC
  Administered 2021-04-16 – 2021-04-17 (×2): 40 mg via ORAL
  Filled 2021-04-16 (×2): qty 1

## 2021-04-16 MED ORDER — FENTANYL CITRATE (PF) 100 MCG/2ML IJ SOLN: 25.0000 ug | INTRAMUSCULAR | Status: DC | PRN

## 2021-04-16 MED ORDER — CEFAZOLIN SODIUM-DEXTROSE 2-4 GM/100ML-% IV SOLN
2.0000 g | Freq: Four times a day (QID) | INTRAVENOUS | Status: AC
  Administered 2021-04-16 (×2): 2 g via INTRAVENOUS
  Filled 2021-04-16 (×2): qty 100

## 2021-04-16 MED ORDER — CEFAZOLIN SODIUM-DEXTROSE 2-4 GM/100ML-% IV SOLN
INTRAVENOUS | Status: AC
  Filled 2021-04-16: qty 100

## 2021-04-16 MED ORDER — BUPIVACAINE-EPINEPHRINE (PF) 0.25% -1:200000 IJ SOLN
INTRAMUSCULAR | Status: AC
  Filled 2021-04-16: qty 30

## 2021-04-16 MED ORDER — INSULIN ASPART 100 UNIT/ML IJ SOLN
INTRAMUSCULAR | Status: AC
  Filled 2021-04-16: qty 1

## 2021-04-16 MED ORDER — CEFAZOLIN SODIUM-DEXTROSE 2-4 GM/100ML-% IV SOLN
2.0000 g | INTRAVENOUS | Status: AC
  Administered 2021-04-16: 08:00:00 2 g via INTRAVENOUS

## 2021-04-16 MED ORDER — METFORMIN HCL 500 MG PO TABS: 1000.0000 mg | ORAL_TABLET | Freq: Two times a day (BID) | ORAL | Status: DC

## 2021-04-16 MED ORDER — ORAL CARE MOUTH RINSE: 15.0000 mL | Freq: Once | OROMUCOSAL | Status: AC

## 2021-04-16 MED ORDER — FAMOTIDINE 20 MG PO TABS: 20.0000 mg | ORAL_TABLET | Freq: Once | ORAL | Status: AC

## 2021-04-16 MED ORDER — INSULIN ASPART PROT & ASPART (70-30 MIX) 100 UNIT/ML ~~LOC~~ SUSP
35.0000 [IU] | Freq: Every day | SUBCUTANEOUS | Status: DC
  Administered 2021-04-16: 22:00:00 35 [IU] via SUBCUTANEOUS
  Filled 2021-04-16: qty 10

## 2021-04-16 MED ORDER — ONDANSETRON HCL 4 MG/2ML IJ SOLN: 4.0000 mg | Freq: Four times a day (QID) | INTRAMUSCULAR | Status: DC | PRN

## 2021-04-16 MED ORDER — NEOMYCIN-POLYMYXIN B GU 40-200000 IR SOLN
Status: AC
  Filled 2021-04-16: qty 2

## 2021-04-16 MED ORDER — CELECOXIB 200 MG PO CAPS
ORAL_CAPSULE | ORAL | Status: AC
  Administered 2021-04-16: 06:00:00 400 mg via ORAL
  Filled 2021-04-16: qty 2

## 2021-04-16 MED ORDER — CELECOXIB 200 MG PO CAPS
200.0000 mg | ORAL_CAPSULE | Freq: Two times a day (BID) | ORAL | Status: DC
  Administered 2021-04-16 – 2021-04-17 (×2): 200 mg via ORAL
  Filled 2021-04-16 (×2): qty 1

## 2021-04-16 MED ORDER — INSULIN ASPART 100 UNIT/ML IJ SOLN
5.0000 [IU] | Freq: Once | INTRAMUSCULAR | Status: AC
  Administered 2021-04-16: 07:00:00 5 [IU] via SUBCUTANEOUS

## 2021-04-16 MED ORDER — SODIUM CHLORIDE 0.9 % IR SOLN
Status: DC | PRN
  Administered 2021-04-16: 08:00:00 502 mL

## 2021-04-16 MED ORDER — BISACODYL 5 MG PO TBEC: 5.0000 mg | DELAYED_RELEASE_TABLET | Freq: Every day | ORAL | Status: DC | PRN

## 2021-04-16 MED ORDER — ONDANSETRON HCL 4 MG PO TABS: 4.0000 mg | ORAL_TABLET | Freq: Four times a day (QID) | ORAL | Status: DC | PRN

## 2021-04-16 MED ORDER — MENTHOL 3 MG MT LOZG
1.0000 | LOZENGE | OROMUCOSAL | Status: DC | PRN
  Filled 2021-04-16: qty 9

## 2021-04-16 MED ORDER — PROPOFOL 500 MG/50ML IV EMUL
INTRAVENOUS | Status: DC | PRN
  Administered 2021-04-16: 50 ug/kg/min via INTRAVENOUS

## 2021-04-16 MED ORDER — MAGNESIUM HYDROXIDE 400 MG/5ML PO SUSP: 30.0000 mL | Freq: Every day | ORAL | Status: DC | PRN

## 2021-04-16 MED ORDER — DILTIAZEM HCL ER COATED BEADS 240 MG PO CP24
240.0000 mg | ORAL_CAPSULE | Freq: Every evening | ORAL | Status: DC
  Administered 2021-04-16: 17:00:00 240 mg via ORAL
  Filled 2021-04-16: qty 1

## 2021-04-16 MED ORDER — INSULIN ASPART 100 UNIT/ML IJ SOLN
0.0000 [IU] | Freq: Three times a day (TID) | INTRAMUSCULAR | Status: DC
  Administered 2021-04-16: 17:00:00 8 [IU] via SUBCUTANEOUS
  Administered 2021-04-17: 09:00:00 2 [IU] via SUBCUTANEOUS
  Administered 2021-04-17: 12:00:00 5 [IU] via SUBCUTANEOUS
  Filled 2021-04-16 (×3): qty 1

## 2021-04-16 MED ORDER — GABAPENTIN 300 MG PO CAPS
ORAL_CAPSULE | ORAL | Status: AC
  Administered 2021-04-16: 06:00:00 300 mg via ORAL
  Filled 2021-04-16: qty 1

## 2021-04-16 MED ORDER — ACETAMINOPHEN 325 MG PO TABS: 325.0000 mg | ORAL_TABLET | Freq: Four times a day (QID) | ORAL | Status: DC | PRN

## 2021-04-16 MED ORDER — PHENYLEPHRINE HCL-NACL 20-0.9 MG/250ML-% IV SOLN
INTRAVENOUS | Status: AC
  Filled 2021-04-16: qty 250

## 2021-04-16 MED ORDER — FENTANYL CITRATE (PF) 100 MCG/2ML IJ SOLN
INTRAMUSCULAR | Status: AC
  Filled 2021-04-16: qty 2

## 2021-04-16 MED ORDER — ARTIFICIAL TEARS OPHTHALMIC OINT
TOPICAL_OINTMENT | OPHTHALMIC | Status: AC
  Filled 2021-04-16: qty 3.5

## 2021-04-16 MED ORDER — ARTIFICIAL TEARS OPHTHALMIC OINT
TOPICAL_OINTMENT | OPHTHALMIC | Status: DC | PRN
  Administered 2021-04-16: 1 via OPHTHALMIC

## 2021-04-16 MED ORDER — MIDAZOLAM HCL 2 MG/2ML IJ SOLN
INTRAMUSCULAR | Status: AC
  Filled 2021-04-16: qty 2

## 2021-04-16 MED ORDER — ACETAMINOPHEN 10 MG/ML IV SOLN
INTRAVENOUS | Status: AC
  Filled 2021-04-16: qty 100

## 2021-04-16 MED ORDER — PROPOFOL 10 MG/ML IV BOLUS
INTRAVENOUS | Status: DC | PRN
  Administered 2021-04-16: 20 mg via INTRAVENOUS

## 2021-04-16 MED ORDER — ACETAMINOPHEN 10 MG/ML IV SOLN
INTRAVENOUS | Status: DC | PRN
  Administered 2021-04-16: 1000 mg via INTRAVENOUS

## 2021-04-16 MED ORDER — ACETAMINOPHEN 10 MG/ML IV SOLN
1000.0000 mg | Freq: Four times a day (QID) | INTRAVENOUS | Status: DC
  Administered 2021-04-16 – 2021-04-17 (×3): 1000 mg via INTRAVENOUS
  Filled 2021-04-16 (×4): qty 100

## 2021-04-16 MED ORDER — SENNOSIDES-DOCUSATE SODIUM 8.6-50 MG PO TABS
1.0000 | ORAL_TABLET | Freq: Two times a day (BID) | ORAL | Status: DC
  Administered 2021-04-16 – 2021-04-17 (×2): 1 via ORAL
  Filled 2021-04-16 (×2): qty 1

## 2021-04-16 MED ORDER — PHENYLEPHRINE HCL (PRESSORS) 10 MG/ML IV SOLN
INTRAVENOUS | Status: DC | PRN
  Administered 2021-04-16 (×2): 80 ug via INTRAVENOUS
  Administered 2021-04-16 (×2): 160 ug via INTRAVENOUS
  Administered 2021-04-16 (×4): 80 ug via INTRAVENOUS
  Administered 2021-04-16: 160 ug via INTRAVENOUS

## 2021-04-16 MED ORDER — GABAPENTIN 300 MG PO CAPS: 300.0000 mg | ORAL_CAPSULE | Freq: Once | ORAL | Status: AC

## 2021-04-16 MED ORDER — FLUTICASONE PROPIONATE 50 MCG/ACT NA SUSP
1.0000 | Freq: Every day | NASAL | Status: DC | PRN
  Filled 2021-04-16: qty 16

## 2021-04-16 MED ORDER — OXYCODONE HCL 5 MG PO TABS: 10.0000 mg | ORAL_TABLET | ORAL | Status: DC | PRN

## 2021-04-16 MED ORDER — CELECOXIB 200 MG PO CAPS: 400.0000 mg | ORAL_CAPSULE | Freq: Once | ORAL | Status: AC

## 2021-04-16 MED ORDER — MIDAZOLAM HCL 5 MG/5ML IJ SOLN
INTRAMUSCULAR | Status: DC | PRN
  Administered 2021-04-16 (×2): 1 mg via INTRAVENOUS

## 2021-04-16 MED ORDER — OXYCODONE HCL 5 MG PO TABS: 5.0000 mg | ORAL_TABLET | ORAL | Status: DC | PRN

## 2021-04-16 SURGICAL SUPPLY — 54 items
BRACE KNEE POST OP SHORT (BRACE) ×2 IMPLANT
C-wire.062 (Wire) ×4 IMPLANT
COOLER POLAR GLACIER W/PUMP (MISCELLANEOUS) ×2 IMPLANT
CUFF TOURN SGL QUICK 24 (TOURNIQUET CUFF)
CUFF TOURN SGL QUICK 34 (TOURNIQUET CUFF)
CUFF TRNQT CYL 24X4X16.5-23 (TOURNIQUET CUFF) IMPLANT
CUFF TRNQT CYL 34X4.125X (TOURNIQUET CUFF) IMPLANT
DRAPE C-ARM XRAY 36X54 (DRAPES) ×3 IMPLANT
DRAPE C-ARMOR (DRAPES) ×3 IMPLANT
DRSG DERMACEA 8X12 NADH (GAUZE/BANDAGES/DRESSINGS) ×3 IMPLANT
DRSG OPSITE POSTOP 4X8 (GAUZE/BANDAGES/DRESSINGS) ×2 IMPLANT
DURAPREP 26ML APPLICATOR (WOUND CARE) ×6 IMPLANT
ELECT CAUTERY BLADE 6.4 (BLADE) ×3 IMPLANT
ELECT REM PT RETURN 9FT ADLT (ELECTROSURGICAL) ×3
ELECTRODE REM PT RTRN 9FT ADLT (ELECTROSURGICAL) ×1 IMPLANT
GAUZE 4X4 16PLY ~~LOC~~+RFID DBL (SPONGE) ×3 IMPLANT
GAUZE SPONGE 4X4 12PLY STRL (GAUZE/BANDAGES/DRESSINGS) ×3 IMPLANT
GLOVE SURG ENC TEXT LTX SZ7.5 (GLOVE) ×3 IMPLANT
GLOVE SURG UNDER LTX SZ8 (GLOVE) ×3 IMPLANT
GOWN STRL REUS W/ TWL LRG LVL3 (GOWN DISPOSABLE) ×2 IMPLANT
GOWN STRL REUS W/TWL LRG LVL3 (GOWN DISPOSABLE) ×4
HEMOVAC 400CC 10FR (MISCELLANEOUS) ×3 IMPLANT
IV CATH ANGIO 14GX3.25 ORG (MISCELLANEOUS) ×3 IMPLANT
KIT TURNOVER KIT A (KITS) ×3 IMPLANT
MANIFOLD NEPTUNE II (INSTRUMENTS) ×3 IMPLANT
NDL HYPO 25X1 1.5 SAFETY (NEEDLE) ×1 IMPLANT
NEEDLE HYPO 25X1 1.5 SAFETY (NEEDLE) ×3 IMPLANT
NS IRRIG 500ML POUR BTL (IV SOLUTION) ×3 IMPLANT
PACK TOTAL KNEE (MISCELLANEOUS) ×3 IMPLANT
PAD ABD DERMACEA PRESS 5X9 (GAUZE/BANDAGES/DRESSINGS) ×4 IMPLANT
PAD WRAPON POLAR KNEE (MISCELLANEOUS) IMPLANT
SOL PREP PVP 2OZ (MISCELLANEOUS) ×3
SOLUTION PREP PVP 2OZ (MISCELLANEOUS) ×1 IMPLANT
SPONGE T-LAP 18X18 ~~LOC~~+RFID (SPONGE) ×9 IMPLANT
STAPLER SKIN PROX 35W (STAPLE) ×3 IMPLANT
STOCKINETTE IMPERV 14X48 (MISCELLANEOUS) ×2 IMPLANT
SUCTION FRAZIER HANDLE 10FR (MISCELLANEOUS) ×4
SUCTION TUBE FRAZIER 10FR DISP (MISCELLANEOUS) ×1 IMPLANT
SUT FIBERWIRE #5 38 CONV BLUE (SUTURE) ×6
SUT ORTHOCORD W/MULTIPK NDL (SUTURE) ×3 IMPLANT
SUT STEEL 7 (SUTURE) IMPLANT
SUT VIC AB 0 CT1 27 (SUTURE) ×2
SUT VIC AB 0 CT1 27XCR 8 STRN (SUTURE) ×1 IMPLANT
SUT VIC AB 0 CT1 36 (SUTURE) ×5 IMPLANT
SUT VIC AB 1 CT1 36 (SUTURE) ×2 IMPLANT
SUT VIC AB 2-0 CT1 27 (SUTURE) ×2
SUT VIC AB 2-0 CT1 TAPERPNT 27 (SUTURE) ×1 IMPLANT
SUTURE FIBERWR #5 38 CONV BLUE (SUTURE) ×2 IMPLANT
SYR 10ML LL (SYRINGE) ×3 IMPLANT
SYR 30ML LL (SYRINGE) ×3 IMPLANT
WATER STERILE IRR 500ML POUR (IV SOLUTION) ×3 IMPLANT
WIRE PLATE REDUCTION 1.25 (Wire) ×2 IMPLANT
WIRE Z .062 C-WIRE SPADE TIP (WIRE) ×4 IMPLANT
WRAPON POLAR PAD KNEE (MISCELLANEOUS) ×3

## 2021-04-16 NOTE — Discharge Instructions (Signed)
° °  [facility] °Department Directory °     ° ° ° [email or web address] ° ° °  ° ° [email or web address] °  ° °      °Cardiology ° °Appointments: °[place] - [phone] °[place] - [phone]  Endocrinology ° °Appointments: °[place] - [phone] °[place] - [phone]  Gastroenterology ° °Appointments: °[place] - [phone] °[place] - [phone]  °      °General Surgery ° ° °Appointments: °[place] - [phone]  Internal Medicine/Family Medicine ° °Appointments: °[place] - [phone] °[place] - [phone] °[place] - [phone]  Metabolic and Weigh Loss Surgery ° °Appointments: °[place] - [phone]  °      °Neurology ° °Appointments: °[place] - [phone] °[place] - [phone]  Neurosurgery ° °Appointments: °[place] - [phone]  Obstetrics & Gynecology ° °Appointments: °[place] - [phone] °[place] - [phone]  °      °Pediatrics ° °Appointments: °[place] - [phone] °[place] - [phone]  Physiatry ° °Appointments: °[place] -[phone]  Physical Therapy ° °Appointments: °[place] - [phone] °[place] - [phone]  °      °Podiatry ° °Appointments: °[place] - [phone] °[place] - [phone]  Pulmonology ° °Appointments: °[place] - [phone]  Rheumatology ° °Appointments: °[place] - [phone]  °      °[place] Location: °[facility]  °[address] °[place], [place]  [address]  [place] Location: °[facility] °[address] °[place], [place]  [address]  [place] Location: °[facility] °[address] °[place], [place]  [address]  °  °

## 2021-04-16 NOTE — H&P (Signed)
The patient has been re-examined, and the chart reviewed, and there have been no interval changes to the documented history and physical.    The risks, benefits, and alternatives have been discussed at length. The patient expressed understanding of the risks benefits and agreed with plans for surgical intervention.  Shanyiah Conde P. Janayla Marik, Jr. M.D.    

## 2021-04-16 NOTE — Anesthesia Postprocedure Evaluation (Signed)
Anesthesia Post Note  Patient: Ricco Dershem  Procedure(s) Performed: OPEN REDUCTION INTERNAL (ORIF) FIXATION PATELLA (Left: Knee)  Patient location during evaluation: PACU Anesthesia Type: Spinal Level of consciousness: awake and alert, awake and oriented Pain management: pain level controlled Vital Signs Assessment: post-procedure vital signs reviewed and stable Respiratory status: spontaneous breathing, nonlabored ventilation and respiratory function stable Cardiovascular status: blood pressure returned to baseline and stable Postop Assessment: no apparent nausea or vomiting Anesthetic complications: no   No notable events documented.   Last Vitals:  Vitals:   04/16/21 1200 04/16/21 1215  BP: 111/77 107/69  Pulse: 94 91  Resp: 16 20  Temp:    SpO2: 93% 95%    Last Pain:  Vitals:   04/16/21 1200  PainSc: 0-No pain                 Manfred Arch

## 2021-04-16 NOTE — Plan of Care (Signed)
°  Problem: Education: Goal: Knowledge of General Education information will improve Description: Including pain rating scale, medication(s)/side effects and non-pharmacologic comfort measures Outcome: Progressing   Problem: Clinical Measurements: Goal: Ability to maintain clinical measurements within normal limits will improve Outcome: Progressing Goal: Respiratory complications will improve Outcome: Progressing   Problem: Nutrition: Goal: Adequate nutrition will be maintained Outcome: Progressing   Problem: Pain Managment: Goal: General experience of comfort will improve Outcome: Progressing   Problem: Education: Goal: Verbalization of understanding the information provided will improve Outcome: Progressing   Problem: Respiratory: Goal: Ability to maintain a clear airway will improve Outcome: Progressing

## 2021-04-16 NOTE — Transfer of Care (Signed)
Immediate Anesthesia Transfer of Care Note  Patient: Kristopher Oneal  Procedure(s) Performed: OPEN REDUCTION INTERNAL (ORIF) FIXATION PATELLA (Left: Knee)  Patient Location: PACU  Anesthesia Type:General  Level of Consciousness: awake, alert  and oriented  Airway & Oxygen Therapy: Patient Spontanous Breathing and Patient connected to nasal cannula oxygen  Post-op Assessment: Report given to RN and Post -op Vital signs reviewed and stable  Post vital signs: Reviewed and stable  Last Vitals:  Vitals Value Taken Time  BP 105/68 04/16/21 1117  Temp    Pulse 102 04/16/21 1120  Resp 17 04/16/21 1120  SpO2 99 % 04/16/21 1120  Vitals shown include unvalidated device data.  Last Pain:  Vitals:   04/16/21 0622  PainSc: 8          Complications: No notable events documented.

## 2021-04-16 NOTE — Op Note (Signed)
OPERATIVE NOTE  DATE OF SURGERY:  04/16/2021  PATIENT NAME:  Kristopher Oneal   DOB: 05/18/1950  MRN: [id number]  PRE-OPERATIVE DIAGNOSIS: Fracture of the left patella (periprosthetic)  POST-OPERATIVE DIAGNOSIS:  Same  PROCEDURE:  Open reduction and internal fixation of the left periprosthetic patella fracture  SURGEON:  Jena Gauss. M.D.  ASSISTANT:  None  ANESTHESIA: spinal  ESTIMATED BLOOD LOSS: 20 mL  FLUIDS REPLACED: 1100 mL of crystalloid  TOURNIQUET TIME: 90 minutes  DRAINS: None  IMPLANTS UTILIZED: 2 - 2.0 mm K wires, 1.25 mm wire  INDICATIONS FOR SURGERY: Kristopher Oneal is a 70 y.o. year old male who sustained a fracture of the left patella. After discussion of the risks and benefits of surgical intervention, the patient expressed understanding of the risks benefits and agree with plans for open reduction and internal fixation of the fracture.   The risks, benefits, and alternatives were discussed at length including but not limited to the risks of infection, bleeding, nerve injury, stiffness, blood clots, the need for revision surgery, cardiopulmonary complications, among others, and they were willing to proceed.  PROCEDURE IN DETAIL: The patient was brought into the operating room and, after adequate spinal anesthesia was achieved, a tourniquet was placed on the patient's upper thigh. The patient's knee and leg were cleaned and prepped with alcohol and DuraPrep and draped in the usual sterile fashion. A "timeout" was performed as per usual protocol. The lower extremity was exsanguinated using an Esmarch, and the tourniquet was inflated to 300 mmHg. An anterior longitudinal incision was made and dissection was carried down to the retinaculum. The fracture site was carefully debrided of hematoma and soft tissue. Two 2.0 mm K wires were inserted in a parallel fashion through the distal fragment. Next, a provisional reduction was performed and maintained using bone reduction  forceps with points. Position and reduction was confirmed using C-arm. The K wires were then directed through the proximal patellar fragment in a retrograde fashion. Again, reduction and position of K wires was confirmed using the C-arm. A 14-gauge Angiocath catheter was passed behind the K wires through the quadriceps tendon and a 1.25 mm wire was passed. The wire was then adjusted to form a figure-of-eight pattern and the Angiocath was used to pass the wire under the K wires along the inferior pole of the patella. The wire was tensioned and then twisted. Excellent compression at the fracture site was appreciated. The proximal portion of the K wires was then bent and impacted so as to engage the wire. The distal portion of the K wires were then bent slightly and cut distal to the tension band position was again confirmed with reduction verified using the C-arm in both AP and lateral planes. Tourniquet was deflated after total tourniquet time of 90 minutes. Hemostasis was achieved using electrocautery. The retinaculum was repaired using #1 Vicryl subcutaneous tissue was approximated in layers using first #0 Vicryl followed by #2-0 Vicryl. Skin was closed with skin staples. 0.25% Marcaine with epinephrine was injected along the incision site. A sterile dressing was applied followed by application of a knee range of motion brace which was locked in extension.  The patient tolerated the procedure well and was transported to the recovery room in stable condition.  Tereso Unangst P. Angie Fava M.D.

## 2021-04-16 NOTE — Anesthesia Procedure Notes (Signed)
Spinal  Patient location during procedure: OR Start time: 04/16/2021 7:30 AM End time: 04/16/2021 7:30 AM Reason for block: surgical anesthesia Staffing Performed: anesthesiologist  Anesthesiologist: Foye Deer, MD Preanesthetic Checklist Completed: patient identified, IV checked, site marked, risks and benefits discussed, surgical consent, monitors and equipment checked, pre-op evaluation and timeout performed Spinal Block Patient position: sitting Prep: DuraPrep Patient monitoring: heart rate, cardiac monitor, continuous pulse ox and blood pressure Approach: midline Location: L3-4 Injection technique: single-shot Needle Needle type: Sprotte  Needle gauge: 24 G Needle length: 9 cm Assessment Sensory level: T10 Events: CSF return

## 2021-04-16 NOTE — Anesthesia Preprocedure Evaluation (Addendum)
Anesthesia Evaluation  Patient identified by MRN, date of birth, ID band Patient awake    Reviewed: Allergy & Precautions, NPO status , Patient's Chart, lab work & pertinent test results, reviewed documented beta blocker date and time   History of Anesthesia Complications Negative for: history of anesthetic complications  Airway Mallampati: III       Dental  (+) Dental Advidsory Given   Pulmonary neg pulmonary ROS, neg sleep apnea, former smoker,    Pulmonary exam normal        Cardiovascular Exercise Tolerance: Good hypertension, Pt. on medications and Pt. on home beta blockers (-) angina(-) Past MI and (-) Cardiac Stents + dysrhythmias Atrial Fibrillation + Valvular Problems/Murmurs  Rhythm:Irregular Rate:Tachycardia     Neuro/Psych PSYCHIATRIC DISORDERS Anxiety negative neurological ROS     GI/Hepatic negative GI ROS, Neg liver ROS,   Endo/Other  diabetes (A1C 8), Poorly Controlled, Type 2, Insulin Dependent, Oral Hypoglycemic Agents  Renal/GU negative Renal ROS  negative genitourinary   Musculoskeletal  (+) Arthritis ,   Abdominal (+) + obese,   Peds negative pediatric ROS (+)  Hematology negative hematology ROS (+)   Anesthesia Other Findings Past Medical History: No date: Arthritis No date: Diabetes mellitus without complication (HCC)     Comment:  type 2 No date: Dysrhythmia     Comment:  PVC No date: Hypertension  Reproductive/Obstetrics                           Anesthesia Physical  Anesthesia Plan  ASA: 3  Anesthesia Plan: Spinal   Post-op Pain Management:    Induction: Intravenous  PONV Risk Score and Plan: 1 and TIVA and Propofol infusion  Airway Management Planned: Natural Airway and Simple Face Mask  Additional Equipment:   Intra-op Plan:   Post-operative Plan:   Informed Consent: I have reviewed the patients History and Physical, chart, labs and  discussed the procedure including the risks, benefits and alternatives for the proposed anesthesia with the patient or authorized representative who has indicated his/her understanding and acceptance.     Dental advisory given  Plan Discussed with: Surgeon and CRNA  Anesthesia Plan Comments: (Pt help last nights metoprolol and diltiazem. He is asymptomatic from current tachycardia and denies recent episodes of palpitation, SOB, fatigue, syncope. His blood glucose was elevated but he held all insulin today. Will treat with metoprolol and subq insulin. )       Anesthesia Quick Evaluation

## 2021-04-16 NOTE — Anesthesia Procedure Notes (Addendum)
Date/Time: 04/16/2021 7:45 AM Performed by: Malva Cogan, CRNA Pre-anesthesia Checklist: Patient identified, Patient being monitored, Timeout performed, Emergency Drugs available and Suction available Patient Re-evaluated:Patient Re-evaluated prior to induction Oxygen Delivery Method: Nasal cannula Ventilation: Oral airway inserted - appropriate to patient size Placement Confirmation: CO2 detector and positive ETCO2 Dental Injury: Teeth and Oropharynx as per pre-operative assessment

## 2021-04-16 NOTE — Progress Notes (Signed)
Notified Dr Laural Benes of elevated heart rate and blood sugar.  Verbal order for Novolog 5Units.  Metoprolol succinate 100mg . By mouth.  Pharmacy has to send up metoprolol. Dr does not want to wait on it. CRNA in room and knows he does not want to wait.   Patient normally takes 70/30 45 units in the mornings.

## 2021-04-17 LAB — GLUCOSE, CAPILLARY
Glucose-Capillary: 148 mg/dL — ABNORMAL HIGH (ref 70–99)
Glucose-Capillary: 240 mg/dL — ABNORMAL HIGH (ref 70–99)

## 2021-04-17 MED ORDER — OXYCODONE HCL 5 MG PO TABS: 5.0000 mg | ORAL_TABLET | ORAL | 0 refills | Status: DC | PRN

## 2021-04-17 MED ORDER — ASPIRIN 325 MG PO TBEC: 325.0000 mg | DELAYED_RELEASE_TABLET | Freq: Every day | ORAL | 0 refills | Status: DC

## 2021-04-17 MED ORDER — TRAMADOL HCL 50 MG PO TABS: 50.0000 mg | ORAL_TABLET | ORAL | 0 refills | Status: DC | PRN

## 2021-04-17 NOTE — TOC Progression Note (Addendum)
Transition of Care [facility]) - Progression Note    Patient Details  Name: Kristopher Oneal MRN: [id number] Date of Birth: [birth year]  [facility]) CM/SW Contact  Marlowe Sax, RN Phone Number: 04/17/2021, 12:07 PM  Clinical Narrative:   spoke with the patient to discuss DC plan, he has a RW and shower seat and can eat home and does not need additional DME, he would like to get [facility] for PT, he lives with his spouse and has transportation, he can afford his medication, I [facility] to [facility] to see if they can accept the patient for [facility] PT, They accepted         Expected Discharge Plan and Services           Expected Discharge Date: 04/17/21                                     Social Determinants of Health (SDOH) Interventions    Readmission Risk Interventions No flowsheet data found.

## 2021-04-17 NOTE — TOC Progression Note (Signed)
Transition of Care [facility]) - Progression Note    Patient Details  Name: Hassan Blackshire MRN: [id number] Date of Birth: February 01, 1951  [facility]) CM/SW Contact  Marlowe Sax, RN Phone Number: 04/17/2021, 1:34 PM  Clinical Narrative:   the patient's wife reminded him that they no longer have a RW at home, He will need one and DC is written, I notified [employer] and asked to expedite it, will be delivered to the room ASAP    Expected Discharge Plan: Home w Home Health Services Barriers to Discharge: Barriers Resolved  Expected Discharge Plan and Services Expected Discharge Plan: Home w Home Health Services   Discharge Planning Services: CM Consult   Living arrangements for the past 2 months: Single Family Home Expected Discharge Date: 04/17/21               DME Arranged: N/A         HH Arranged: PT HH Agency: [facility] Date HH Agency Contacted: 04/17/21 Time HH Agency Contacted: 1231 Representative spoke with at [facility] Agency: kelsey   Social Determinants of Health (SDOH) Interventions    Readmission Risk Interventions No flowsheet data found.

## 2021-04-17 NOTE — Evaluation (Signed)
Physical Therapy Evaluation Patient Details Name: Kristopher Oneal MRN: [id number] DOB: [birth year] Today's Date: 04/17/2021  History of Present Illness  70 y/o male s/p L TKA 8/22, had recovered well and manging independently.  Was coming down steps from his RV and felt a pop, subsequently found to have peri-operative patellar fx needing ORIF (04/16/21).  Clinical Impression  Pt did very well with mobility and showed good effort with all tasks.  He showed ability to ambulate without WBing hesitation and displayed great quad strength and control.  Pt has steps to enter home (RV) and we should be able to trial these later today before discharge.  Pt with no safety issues and feels confident about being able to manage at home with assist from wife.       Recommendations for follow up therapy are one component of a multi-disciplinary discharge planning process, led by the attending physician.  Recommendations may be updated based on patient status, additional functional criteria and insurance authorization.  Follow Up Recommendations Follow physician's recommendations for discharge plan and follow up therapies    Assistance Recommended at Discharge PRN  Functional Status Assessment Patient has had a recent decline in their functional status and demonstrates the ability to make significant improvements in function in a reasonable and predictable amount of time.  Equipment Recommendations  None recommended by PT    Recommendations for Other Services       Precautions / Restrictions Precautions Precautions: Knee;Fall Required Braces or Orthoses: Knee Immobilizer - Right Knee Immobilizer - Right: On at all times Restrictions Weight Bearing Restrictions: Yes RLE Weight Bearing: Weight bearing as tolerated      Mobility  Bed Mobility Overal bed mobility: Independent             General bed mobility comments: easily gets up to sitting EOB, no UE assist needed for R LE     Transfers Overall transfer level: Independent Equipment used: Rolling walker (2 wheels)               General transfer comment: Pt needed heavy UE use to assist, but did not need any assist to rise    Ambulation/Gait Ambulation/Gait assistance: Modified independent (Device/Increase time) Gait Distance (Feet): 110 Feet Assistive device: Rolling walker (2 wheels)         General Gait Details: Pt did very well with ambulation, showing appropriate WBing tolerance/reliance on the walker, able to maintain consistent cadence despite KI being locked in TKE and overall showing good balance, tolerance and confidence  Stairs            Wheelchair Mobility    Modified Rankin (Stroke Patients Only)       Balance Overall balance assessment: Independent                                           Pertinent Vitals/Pain Pain Assessment: 0-10 Pain Score: 3  Pain Location: medial knee/quad    Home Living Family/patient expects to be discharged to:: Private residence Living Arrangements: Spouse/significant other Available Help at Discharge: Family;Available 24 hours/day Type of Home: Other(Comment) (RV) Home Access: Stairs to enter Entrance Stairs-Rails: Left Entrance Stairs-Number of Steps: 4 to enter RV with rail on L side     Home Equipment: Rolling Walker (2 wheels);Shower seat - built in;Cane - single point      Prior Function Prior Level of Function : Independent/Modified  Independent             Mobility Comments: Pt had recovered well from TKA, not needing AD and back to active independence       Hand Dominance        Extremity/Trunk Assessment   Upper Extremity Assessment Upper Extremity Assessment: Overall WFL for tasks assessed    Lower Extremity Assessment Lower Extremity Assessment: Overall WFL for tasks assessed (R LE in KI, solid quad sets, SLRs)       Communication   Communication: No difficulties  Cognition  Arousal/Alertness: Awake/alert Behavior During Therapy: WFL for tasks assessed/performed Overall Cognitive Status: Within Functional Limits for tasks assessed                                          General Comments      Exercises General Exercises - Lower Extremity Ankle Circles/Pumps: AROM;10 reps Quad Sets: Strengthening;10 reps Hip ABduction/ADduction: Strengthening;10 reps Straight Leg Raises: AROM;Strengthening;10 reps   Assessment/Plan    PT Assessment Patient needs continued PT services  PT Problem List Decreased strength;Decreased range of motion;Decreased activity tolerance;Decreased knowledge of use of DME;Decreased safety awareness;Pain       PT Treatment Interventions DME instruction;Gait training;Stair training;Functional mobility training;Therapeutic activities;Therapeutic exercise;Balance training;Neuromuscular re-education;Patient/family education    PT Goals (Current goals can be found in the Care Plan section)  Acute Rehab PT Goals Patient Stated Goal: Go home PT Goal Formulation: With patient Time For Goal Achievement: 05/01/21 Potential to Achieve Goals: Good    Frequency BID   Barriers to discharge        Co-evaluation               AM-PAC PT "6 Clicks" Mobility  Outcome Measure Help needed turning from your back to your side while in a flat bed without using bedrails?: None Help needed moving from lying on your back to sitting on the side of a flat bed without using bedrails?: None Help needed moving to and from a bed to a chair (including a wheelchair)?: None Help needed standing up from a chair using your arms (e.g., wheelchair or bedside chair)?: None Help needed to walk in hospital room?: None Help needed climbing 3-5 steps with a railing? : None 6 Click Score: 24    End of Session Equipment Utilized During Treatment: Gait belt Activity Tolerance: Patient tolerated treatment well Patient left: with chair alarm  set Nurse Communication: Mobility status PT Visit Diagnosis: Muscle weakness (generalized) (M62.81);Difficulty in walking, not elsewhere classified (R26.2);Pain Pain - Right/Left: Right Pain - part of body: Knee    Time: 7169-6789 PT Time Calculation (min) (ACUTE ONLY): 45 min   Charges:   PT Evaluation $PT Eval Low Complexity: 1 Low PT Treatments $Gait Training: 8-22 mins $Therapeutic Activity: 8-22 mins        Malachi Pro, DPT 04/17/2021, 9:35 AM

## 2021-04-17 NOTE — Progress Notes (Signed)
Physical Therapy Treatment Patient Details Name: Kristopher Oneal MRN: [id number] DOB: 11-20-1950 Today's Date: 04/17/2021   History of Present Illness 70 y/o male s/p L TKA 8/22, had recovered well and manging independently.  Was coming down steps from his RV and felt a pop, subsequently found to have peri-operative patellar fx needing ORIF (04/16/21).    PT Comments    Pt continues to be very pleasant and eager to work with PT. He did very well with ambulation and stair negotiation needing minimal cuing and explanation with either.  Reinforced with pt and wife about expectations and precautions for going home and progression of exercises and activity.  Pt with good understanding, safe to go home from PT perspective once he gets a walker.    Recommendations for follow up therapy are one component of a multi-disciplinary discharge planning process, led by the attending physician.  Recommendations may be updated based on patient status, additional functional criteria and insurance authorization.  Follow Up Recommendations  Follow physician's recommendations for discharge plan and follow up therapies     Assistance Recommended at Discharge PRN  Equipment Recommendations  Rolling walker (2 wheels) (wife reminded him that they gave old walker away)    Recommendations for Other Services       Precautions / Restrictions Precautions Precautions: Knee;Fall Required Braces or Orthoses: Knee Immobilizer - Right Knee Immobilizer - Right: On at all times Restrictions RLE Weight Bearing: Weight bearing as tolerated     Mobility  Bed Mobility                    Transfers Overall transfer level: Independent Equipment used: Rolling walker (2 wheels)               General transfer comment: Minimal cuing to insure appropriate set up but able to rise and return to sitting with confidence and control    Ambulation/Gait Ambulation/Gait assistance: Modified independent  (Device/Increase time) Gait Distance (Feet): 150 Feet Assistive device: Rolling walker (2 wheels)         General Gait Details: Pt again easily ambulates with walker and apart from the mechanics of TKE in KI he was able to show appropriate cadence and WBing tolerance.   Stairs Stairs: Yes Stairs assistance: Supervision Stair Management: One rail Right;One rail Left Number of Stairs: 4 General stair comments: multiple times up/down steps using different side single rails to simulate steps to enter and steps in his home.  Pt able to negotiate safely and confidently w/o assist and with only minimal explanation   Wheelchair Mobility    Modified Rankin (Stroke Patients Only)       Balance Overall balance assessment: Independent                                          Cognition Arousal/Alertness: Awake/alert Behavior During Therapy: WFL for tasks assessed/performed Overall Cognitive Status: Within Functional Limits for tasks assessed                                          Exercises      General Comments        Pertinent Vitals/Pain Pain Score: 2  Pain Location: medial knee/quad    Home Living  Prior Function            PT Goals (current goals can now be found in the care plan section) Progress towards PT goals: Progressing toward goals    Frequency    BID      PT Plan Current plan remains appropriate    Co-evaluation              AM-PAC PT "6 Clicks" Mobility   Outcome Measure  Help needed turning from your back to your side while in a flat bed without using bedrails?: None Help needed moving from lying on your back to sitting on the side of a flat bed without using bedrails?: None Help needed moving to and from a bed to a chair (including a wheelchair)?: None Help needed standing up from a chair using your arms (e.g., wheelchair or bedside chair)?: None Help needed to  walk in hospital room?: None Help needed climbing 3-5 steps with a railing? : None 6 Click Score: 24    End of Session         PT Visit Diagnosis: Muscle weakness (generalized) (M62.81);Difficulty in walking, not elsewhere classified (R26.2);Pain Pain - Right/Left: Right Pain - part of body: Knee     Time: 1210-1238 PT Time Calculation (min) (ACUTE ONLY): 28 min  Charges:  $Gait Training: 8-22 mins $Therapeutic Activity: 8-22 mins                     Malachi Pro, DPT 04/17/2021, 2:13 PM

## 2021-04-17 NOTE — Progress Notes (Signed)
Reviewed d/c instructions with pt, PIV removed, wife at bedside. No distress noted.

## 2021-04-17 NOTE — Discharge Summary (Signed)
Physician Discharge Summary  Patient ID: Kristopher Oneal MRN: [id number] DOB/AGE: Feb 15, 1951 70 y.o.  Admit date: 04/16/2021 Discharge date: 04/17/2021  Admission Diagnoses:  Patella fracture [S82.009A]  Surgeries:Procedure(s): Open reduction and internal fixation of the left periprosthetic patella fracture   SURGEON:  Jena Gauss. M.D.   ASSISTANT:  None   ANESTHESIA: spinal   ESTIMATED BLOOD LOSS: 20 mL   FLUIDS REPLACED: 1100 mL of crystalloid   TOURNIQUET TIME: 90 minutes   DRAINS: None   IMPLANTS UTILIZED: 2 - 2.0 mm K wires, 1.25 mm wire  Discharge Diagnoses: Patient Active Problem List   Diagnosis Date Noted   Patella fracture 04/16/2021   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/19/2020   Status post total right knee replacement 01/18/2020   Atrial fibrillation and flutter (HCC) 07/09/2018   DM type 2 with diabetic mixed hyperlipidemia (HCC) 02/23/2018   B12 deficiency 07/17/2016   Benign essential hypertension 07/17/2016   Chronic painful diabetic neuropathy (HCC) 07/17/2016   Hyperlipidemia, mixed 07/17/2016   Symptomatic PVCs 07/17/2016    Past Medical History:  Diagnosis Date   Anxiety    Arthritis    Diabetes mellitus without complication (HCC)    type 2   Dysrhythmia    PVC   Heart murmur    Hypertension      Transfusion:    Consultants (if any):   Discharged Condition: Improved  Hospital Course: Kristopher Oneal is an 70 y.o. male who was admitted 04/16/2021 with a diagnosis of left patella fracture (periprosthetic) and went to the operating room on 04/16/2021 and underwent ORIF of left patella fracture The patient received perioperative antibiotics for prophylaxis (see below). The patient tolerated the procedure well and was transported to PACU in stable condition. After meeting PACU criteria, the patient was subsequently transferred to the Orthopaedics/Rehabilitation unit.   The patient received DVT prophylaxis in the form of early  mobilization, Foot Pumps, TED hose, and Aspirin . A sacral pad had been placed and heels were elevated off of the bed with rolled towels in order to protect skin integrity. Foley catheter was discontinued on postoperative day #0.   Physical therapy was initiated postoperatively for transfers, gait training, and strengthening. Occupational therapy was initiated for activities of daily living and evaluation for assisted devices. Rehabilitation goals were reviewed in detail with the patient. The patient made steady progress with physical therapy and physical therapy recommended discharge to Home.   The patient achieved the preliminary goals of this hospitalization and was felt to be medically and orthopaedically appropriate for discharge.  He was given perioperative antibiotics:  Anti-infectives (From admission, onward)    Start     Dose/Rate Route Frequency Ordered Stop   04/16/21 1600  ceFAZolin (ANCEF) IVPB 2g/100 mL premix        2 g 200 mL/hr over 30 Minutes Intravenous Every 6 hours 04/16/21 1428 04/16/21 2218   04/16/21 0611  ceFAZolin (ANCEF) 2-4 GM/100ML-% IVPB       Note to Pharmacy: Rayann Heman T: cabinet override      04/16/21 0611 04/16/21 0757   04/16/21 0600  ceFAZolin (ANCEF) IVPB 2g/100 mL premix        2 g 200 mL/hr over 30 Minutes Intravenous On call to O.R. 04/16/21 7106 04/16/21 2694     .  Recent vital signs:  Vitals:   04/17/21 0832 04/17/21 1123  BP: 124/77 103/61  Pulse: 71 67  Resp: 16 16  Temp: 97.6 F (36.4 C) 98.2 F (  36.8 C)  SpO2: 99% 97%    Recent laboratory studies:  Recent Labs    04/16/21 0619  WBC 8.5  HGB 14.9  HCT 43.9  PLT 274  K 4.2  CL 102  CO2 23  BUN 34*  CREATININE 1.07  GLUCOSE 268*  CALCIUM 9.3    Diagnostic Studies: DG Knee 1-2 Views Left  Result Date: 04/16/2021 CLINICAL DATA:  Patellar fracture. EXAM: LEFT KNEE - 1-2 VIEW COMPARISON:  Radiographs 11/19/2020. FINDINGS: C-arm fluoroscopy provided. Two spot  fluoroscopic images are submitted, demonstrating K-wire and additional anterior wire fixation of the patella for a probable transverse fracture which appears nondisplaced. There is a small amount of air within the joint. Patient is status post previous total knee arthroplasty. IMPRESSION: Intraoperative views following patellar ORIF. No displaced fracture or other complication identified. Electronically Signed   By: Carey Bullocks M.D.   On: 04/16/2021 11:23   DG C-Arm 1-60 Min-No Report  Result Date: 04/16/2021 Fluoroscopy was utilized by the requesting physician.  No radiographic interpretation.   DG C-Arm 1-60 Min-No Report  Result Date: 04/16/2021 Fluoroscopy was utilized by the requesting physician.  No radiographic interpretation.   DG C-Arm 1-60 Min-No Report  Result Date: 04/16/2021 Fluoroscopy was utilized by the requesting physician.  No radiographic interpretation.    Discharge Medications:   Allergies as of 04/17/2021   No Known Allergies      Medication List     STOP taking these medications    aspirin 325 MG tablet Replaced by: aspirin 325 MG EC tablet       TAKE these medications    Advanced Diabetic Multivitamin Tabs Take 1 tablet by mouth daily.   aspirin 325 MG EC tablet Take 1 tablet (325 mg total) by mouth daily with breakfast. Start taking on: April 18, 2021 Replaces: aspirin 325 MG tablet   celecoxib 200 MG capsule Commonly known as: CELEBREX Take 1 capsule (200 mg total) by mouth 2 (two) times daily.   diltiazem 240 MG 24 hr capsule Commonly known as: CARDIZEM CD Take 240 mg by mouth every evening.   fenofibrate 160 MG tablet Take 160 mg by mouth daily.   fluticasone 50 MCG/ACT nasal spray Commonly known as: FLONASE Place 1 spray into the nose daily as needed for allergies.   gabapentin 300 MG capsule Commonly known as: NEURONTIN Take 900 mg by mouth at bedtime.   glipiZIDE 5 MG tablet Commonly known as: GLUCOTROL Take 5 mg  by mouth 2 (two) times daily.   HumuLIN 70/30 (70-30) 100 UNIT/ML injection Generic drug: insulin NPH-regular Human Inject 35-45 Units into the skin See admin instructions. Inject 45 units into the skin in the morning and 35 units in the evening   lovastatin 40 MG tablet Commonly known as: MEVACOR Take 40 mg by mouth daily with supper.   metFORMIN 1000 MG tablet Commonly known as: GLUCOPHAGE Take 1,000 mg by mouth 2 (two) times daily with a meal.   metoprolol succinate 100 MG 24 hr tablet Commonly known as: TOPROL-XL Take 100 mg by mouth at bedtime.   oxyCODONE 5 MG immediate release tablet Commonly known as: Oxy IR/ROXICODONE Take 1 tablet (5 mg total) by mouth every 4 (four) hours as needed for severe pain.   traMADol 50 MG tablet Commonly known as: ULTRAM Take 1-2 tablets (50-100 mg total) by mouth every 4 (four) hours as needed for moderate pain.   triamterene-hydrochlorothiazide 37.5-25 MG tablet Commonly known as: MAXZIDE-25 Take 1 tablet by mouth  at bedtime.   venlafaxine XR 37.5 MG 24 hr capsule Commonly known as: EFFEXOR-XR Take 37.5 mg by mouth daily with breakfast.   vitamin B-12 500 MCG tablet Commonly known as: CYANOCOBALAMIN Take 1,000 mcg by mouth daily.   vitamin C 1000 MG tablet Take 1,000 mg by mouth daily.        Disposition: Home with home health PT     Follow-up Information     Madelyn Flavors, PA-C Follow up on 04/25/2021.   Specialty: Orthopedic Surgery Why: at 1:15pm Contact information: [address] [facility] [place] [address] [phone]         Donato Heinz, MD Follow up on 05/28/2021.   Specialty: Orthopedic Surgery Why: at 2:30pm Contact information: [address] [facility] [address] [facility] [place] [address] [phone]                  Lasandra Beech, PA-C 04/17/2021, 11:40 AM

## 2021-04-17 NOTE — Progress Notes (Signed)
°  Subjective: 1 Day Post-Op Procedure(s) (LRB): OPEN REDUCTION INTERNAL (ORIF) FIXATION PATELLA (Left) Patient reports pain as well-controlled.   Patient is well, and has had no acute complaints or problems Plan is to go Home after hospital stay. Negative for chest pain and shortness of breath Fever: no Gastrointestinal: negative for nausea and vomiting.  Patient has not had a bowel movement.  Objective: Vital signs in last 24 hours: Temp:  [97.3 F (36.3 C)-98.9 F (37.2 C)] 98 F (36.7 C) (12/28 0349) Pulse Rate:  [69-103] 69 (12/28 0349) Resp:  [14-21] 14 (12/28 0349) BP: (99-133)/(62-86) 108/70 (12/28 0349) SpO2:  [93 %-98 %] 95 % (12/28 0349)  Intake/Output from previous day:  Intake/Output Summary (Last 24 hours) at 04/17/2021 0725 Last data filed at 04/17/2021 0435 Gross per 24 hour  Intake 1600 ml  Output 2620 ml  Net -1020 ml    Intake/Output this shift: No intake/output data recorded.  Labs: Recent Labs    04/16/21 0619  HGB 14.9   Recent Labs    04/16/21 0619  WBC 8.5  RBC 4.71  HCT 43.9  PLT 274   Recent Labs    04/16/21 0619  NA 136  K 4.2  CL 102  CO2 23  BUN 34*  CREATININE 1.07  GLUCOSE 268*  CALCIUM 9.3   No results for input(s): LABPT, INR in the last 72 hours.   EXAM General - Patient is Alert, Appropriate, and Oriented Extremity - Neurovascular intact Dorsiflexion/Plantar flexion intact Compartment soft Dressing/Incision -Postoperative dressing remains in place., Polar Care in place and working.  Motor Function - intact, moving foot and toes well on exam.     Assessment/Plan: 1 Day Post-Op Procedure(s) (LRB): OPEN REDUCTION INTERNAL (ORIF) FIXATION PATELLA (Left) Principal Problem:   Patella fracture  Estimated body mass index is 35.05 kg/m as calculated from the following:   Height as of this encounter: 6\' 2"  (1.88 m).   Weight as of this encounter: 123.8 kg. Advance diet Up with therapy  Discharge later today  if completion of PT goals     DVT Prophylaxis - Ted hose and foot pumps, ASA 325  Weight-Bearing as tolerated to left leg while in knee immobilizer (or hinge brace locked in extension)  , PA-C [facility] Orthopaedic Surgery 04/17/2021, 7:25 AM

## 2021-05-05 NOTE — Discharge Instructions (Addendum)
° °  [facility] Department Directory         [email or web address]       [email or web address]          Cardiology  Appointments: [place] - [phone]  Endocrinology  Appointments: [place] [phone] [place] - [phone]  Gastroenterology  Appointments: [place] [phone] [place] - [phone]        General Surgery   Appointments: [facility]  Internal Medicine/Family Medicine  Appointments: [facility] [place] - [phone] [place] - [phone]  Metabolic and Weigh Loss Surgery  Appointments: [facility]        Neurology  Appointments: [place] [phone] [place] - [phone]  Neurosurgery  Appointments: [place]  Obstetrics & Gynecology  Appointments: [place] [phone] [place] - [phone]        Pediatrics  Appointments: [place] [phone] [place] - [phone]  Physiatry  Appointments: [place] [phone]  Physical Therapy  Appointments: [place] - [phone]        Podiatry  Appointments: [place] [phone] [place] - [phone]  Pulmonology  Appointments: [place]  Rheumatology  Appointments: [place] [phone]        [place] Location: [facility]  [address] [place], [place]  [address]  [place] Location: [facility] [address] [place], [place]  [address]  [place] Location: [facility] [address] [place], [place]  [address]     Diet: As you were doing prior to hospitalization   Shower:  May shower but keep the wounds dry, use an occlusive plastic wrap, NO SOAKING IN TUB.  If the bandage gets wet, change with a clean dry gauze.  Dressing:  Leave dressing intact until first follow-up appointment for skin check.  Activity:  Increase activity slowly as tolerated, but follow the weight bearing  instructions below.  No lifting or driving for 6 weeks.  Weight Bearing:   Weight bearing as tolerated to left lower extremity with the knee brace locked in extension at all times.  To prevent constipation: you may use a stool softener such as -  Colace (over the counter) 100 mg by mouth twice a day  Drink plenty of fluids (prune juice may be helpful) and high fiber foods Miralax (over the counter) for constipation as needed.    Itching:  If you experience itching with your medications, try taking only a single pain pill, or even half a pain pill at a time.  You may take up to 10 pain pills per day, and you can also use benadryl over the counter for itching or also to help with sleep.   Precautions:  If you experience chest pain or shortness of breath - call 911 immediately for transfer to the hospital emergency department!!  If you develop a fever greater that 101 F, purulent drainage from wound, increased redness or drainage from wound, or calf pain-Call [facility]                                               Follow- Up Appointment:  Follow-up next week for skin check.

## 2021-05-07 ENCOUNTER — Other Ambulatory Visit
Admission: RE | Admit: 2021-05-07 | Discharge: 2021-05-07 | Disposition: A | Discharge status: Home / Self Care | Payer: Medicare Other | Source: Ambulatory Visit | Attending: Orthopedic Surgery | Admitting: Orthopedic Surgery

## 2021-05-07 ENCOUNTER — Other Ambulatory Visit: Payer: Self-pay

## 2021-05-07 HISTORY — DX: Pure hypercholesterolemia, unspecified: E78.00

## 2021-05-07 NOTE — Patient Instructions (Addendum)
Your procedure is scheduled on: Friday 1/20 Report to Day Surgery. Stop by registration first To find out your arrival time please call [phone] between 1PM - 3PM on  Thurs 1/19  Remember: Instructions that are not followed completely may result in serious medical risk,  up to and including death, or upon the discretion of your surgeon and anesthesiologist your  surgery may need to be rescheduled.     _X__ 1. Do not eat food after midnight the night before your procedure.                 No chewing gum or hard candies. You may drink clear liquids up to 2 hours                 before you are scheduled to arrive for your surgery- DO not drink clear                 liquids within 2 hours of the start of your surgery.                 Clear Liquids include:  water, G2 or                  Gatorade Zero (avoid Red/Purple/Blue), Black Coffee or Tea (Do not add                 anything to coffee or tea). __X__2.  On the morning of surgery brush your teeth with toothpaste and water, you                may rinse your mouth with mouthwash if you wish.  Do not swallow any  toothpaste of mouthwash.     _X__ 3.  No Alcohol for 24 hours before or after surgery.   ___ 4.  Do Not Smoke or use e-cigarettes For 24 Hours Prior to Your Surgery.                 Do not use any chewable tobacco products for at least 6 hours prior to                 Surgery.  ___  5.  Do not use any recreational drugs (marijuana, cocaine, heroin, ecstasy, MDMA or other) For at least one week prior to your surgery.      Combination of these drugs with anesthesia may have life threatening  results.  ____  6.  Bring all medications with you on the day of surgery if instructed.   ___x_  7.  Notify your doctor if there is any change in your medical condition      (cold, fever, infections).     Do not wear jewelry,  Do not wear lotions,  You may wear deodorant. Do not shave 48 hours prior to surgery.  Men may shave face and neck. Do not bring valuables to the hospital.    [facility] is not responsible for any belongings or valuables.  Contacts, dentures or bridgework may not be worn into surgery. Leave your suitcase in the car. After surgery it may be brought to your room. For patients admitted to the hospital, discharge time is determined by your treatment team.   Patients discharged the day of surgery will not be allowed to drive home.   Make arrangements for someone to be with you for the first 24 hours of your Same Day Discharge.    Please read over the following fact sheets that  you were given:       _x___ Take these medicines the morning of surgery with A SIP OF WATER:    1. fenofibrate 160 MG tablet  2. fluticasone (FLONASE) 50 MCG/ACT nasal spray if needed  3. tramadol if needed   4.venlafaxine XR (EFFEXOR-XR) 37.5 MG 24 hr capsule  5.  6.  ____ Fleet Enema (as directed)   __x_ Shower the night before and morning of surgery.  ____ Use Benzoyl Peroxide Gel as instructed  ____ Use inhalers on the day of surgery  __x__ Stop metformin 2 days prior to surgery Last dose today 1/17    ___x_ Take 70% of evening dose  (25 units) the night before surgery. No insulin the morning          of surgery.   __x__ Stopped aspirin on 1/15  ___x_ Stop Anti-inflammatories no ibuprofen or aleve may continue celebrex.   ___x_ Stop supplements until after surgery.    ____ Bring C-Pap to the hospital.    If you have any questions regarding your pre-procedure instructions,  Please call Pre-admit Testing at [phone]

## 2021-05-09 MED ORDER — CHLORHEXIDINE GLUCONATE 0.12 % MT SOLN: 15.0000 mL | Freq: Once | OROMUCOSAL | Status: AC

## 2021-05-09 MED ORDER — ORAL CARE MOUTH RINSE: 15.0000 mL | Freq: Once | OROMUCOSAL | Status: AC

## 2021-05-09 MED ORDER — SODIUM CHLORIDE 0.9 % IV SOLN: INTRAVENOUS | Status: DC

## 2021-05-09 MED ORDER — FAMOTIDINE 20 MG PO TABS: 20.0000 mg | ORAL_TABLET | Freq: Once | ORAL | Status: AC

## 2021-05-09 MED ORDER — CELECOXIB 200 MG PO CAPS: 400.0000 mg | ORAL_CAPSULE | Freq: Once | ORAL | Status: AC

## 2021-05-09 MED ORDER — CEFAZOLIN SODIUM-DEXTROSE 2-4 GM/100ML-% IV SOLN: 2.0000 g | INTRAVENOUS | Status: DC

## 2021-05-10 ENCOUNTER — Ambulatory Visit: Payer: Medicare Other | Admitting: Anesthesiology

## 2021-05-10 ENCOUNTER — Observation Stay
Admission: RE | Admit: 2021-05-10 | Discharge: 2021-05-11 | Disposition: A | Discharge status: Home / Self Care | Payer: Medicare Other | Attending: Orthopedic Surgery | Admitting: Orthopedic Surgery

## 2021-05-10 ENCOUNTER — Other Ambulatory Visit: Payer: Self-pay

## 2021-05-10 ENCOUNTER — Encounter: Payer: Self-pay | Admitting: Orthopedic Surgery

## 2021-05-10 ENCOUNTER — Encounter
Admission: RE | Disposition: A | Discharge status: Home / Self Care | Payer: Self-pay | Source: Home / Self Care | Attending: Orthopedic Surgery

## 2021-05-10 ENCOUNTER — Ambulatory Visit: Payer: Medicare Other

## 2021-05-10 DIAGNOSIS — Z87891 Personal history of nicotine dependence: Secondary | ICD-10-CM | POA: Insufficient documentation

## 2021-05-10 DIAGNOSIS — E785 Hyperlipidemia, unspecified: Secondary | ICD-10-CM | POA: Insufficient documentation

## 2021-05-10 DIAGNOSIS — Z794 Long term (current) use of insulin: Secondary | ICD-10-CM | POA: Diagnosis not present

## 2021-05-10 DIAGNOSIS — Z6835 Body mass index (BMI) 35.0-35.9, adult: Secondary | ICD-10-CM | POA: Diagnosis not present

## 2021-05-10 DIAGNOSIS — Z79899 Other long term (current) drug therapy: Secondary | ICD-10-CM | POA: Diagnosis not present

## 2021-05-10 DIAGNOSIS — Z96653 Presence of artificial knee joint, bilateral: Secondary | ICD-10-CM | POA: Insufficient documentation

## 2021-05-10 DIAGNOSIS — Y712 Prosthetic and other implants, materials and accessory cardiovascular devices associated with adverse incidents: Secondary | ICD-10-CM | POA: Diagnosis not present

## 2021-05-10 DIAGNOSIS — Z8781 Personal history of (healed) traumatic fracture: Secondary | ICD-10-CM

## 2021-05-10 DIAGNOSIS — T84498A Other mechanical complication of other internal orthopedic devices, implants and grafts, initial encounter: Secondary | ICD-10-CM | POA: Diagnosis present

## 2021-05-10 DIAGNOSIS — E1169 Type 2 diabetes mellitus with other specified complication: Secondary | ICD-10-CM | POA: Diagnosis not present

## 2021-05-10 DIAGNOSIS — Z9889 Other specified postprocedural states: Secondary | ICD-10-CM

## 2021-05-10 DIAGNOSIS — I10 Essential (primary) hypertension: Secondary | ICD-10-CM | POA: Diagnosis not present

## 2021-05-10 DIAGNOSIS — Z7984 Long term (current) use of oral hypoglycemic drugs: Secondary | ICD-10-CM | POA: Insufficient documentation

## 2021-05-10 DIAGNOSIS — M9712XA Periprosthetic fracture around internal prosthetic left knee joint, initial encounter: Secondary | ICD-10-CM | POA: Diagnosis not present

## 2021-05-10 DIAGNOSIS — S82002A Unspecified fracture of left patella, initial encounter for closed fracture: Secondary | ICD-10-CM

## 2021-05-10 HISTORY — PX: ORIF PATELLA: SHX5033

## 2021-05-10 LAB — GLUCOSE, CAPILLARY
Glucose-Capillary: 217 mg/dL — ABNORMAL HIGH (ref 70–99)
Glucose-Capillary: 193 mg/dL — ABNORMAL HIGH (ref 70–99)
Glucose-Capillary: 307 mg/dL — ABNORMAL HIGH (ref 70–99)

## 2021-05-10 SURGERY — OPEN REDUCTION INTERNAL FIXATION (ORIF) PATELLA
Anesthesia: Spinal | Site: Knee | Laterality: Left

## 2021-05-10 MED ORDER — OXYCODONE HCL 5 MG PO TABS
ORAL_TABLET | ORAL | Status: AC
  Administered 2021-05-10: 5 mg via ORAL
  Filled 2021-05-10: qty 1

## 2021-05-10 MED ORDER — PROPOFOL 500 MG/50ML IV EMUL
INTRAVENOUS | Status: DC | PRN
  Administered 2021-05-10: 100 ug/kg/min via INTRAVENOUS

## 2021-05-10 MED ORDER — OXYCODONE HCL 5 MG PO TABS: 5.0000 mg | ORAL_TABLET | Freq: Once | ORAL | Status: AC | PRN

## 2021-05-10 MED ORDER — FLEET ENEMA 7-19 GM/118ML RE ENEM: 1.0000 | ENEMA | Freq: Once | RECTAL | Status: DC | PRN

## 2021-05-10 MED ORDER — MENTHOL 3 MG MT LOZG
1.0000 | LOZENGE | OROMUCOSAL | Status: DC | PRN
  Filled 2021-05-10: qty 9

## 2021-05-10 MED ORDER — CHLORHEXIDINE GLUCONATE 0.12 % MT SOLN
OROMUCOSAL | Status: AC
  Administered 2021-05-10: 15 mL via OROMUCOSAL
  Filled 2021-05-10: qty 15

## 2021-05-10 MED ORDER — ONDANSETRON HCL 4 MG/2ML IJ SOLN
INTRAMUSCULAR | Status: AC
  Filled 2021-05-10: qty 2

## 2021-05-10 MED ORDER — ONDANSETRON HCL 4 MG/2ML IJ SOLN
INTRAMUSCULAR | Status: DC | PRN
  Administered 2021-05-10: 4 mg via INTRAVENOUS

## 2021-05-10 MED ORDER — FENTANYL CITRATE (PF) 100 MCG/2ML IJ SOLN: 25.0000 ug | INTRAMUSCULAR | Status: DC | PRN

## 2021-05-10 MED ORDER — METOPROLOL TARTRATE 5 MG/5ML IV SOLN
INTRAVENOUS | Status: DC | PRN
  Administered 2021-05-10: 2 mg via INTRAVENOUS
  Administered 2021-05-10: 3 mg via INTRAVENOUS

## 2021-05-10 MED ORDER — FERROUS SULFATE 325 (65 FE) MG PO TABS
325.0000 mg | ORAL_TABLET | Freq: Two times a day (BID) | ORAL | Status: DC
  Administered 2021-05-11: 325 mg via ORAL
  Filled 2021-05-10: qty 1

## 2021-05-10 MED ORDER — INSULIN ASPART 100 UNIT/ML IJ SOLN
0.0000 [IU] | Freq: Every day | INTRAMUSCULAR | Status: DC
  Administered 2021-05-10: 4 [IU] via SUBCUTANEOUS
  Filled 2021-05-10: qty 1

## 2021-05-10 MED ORDER — PROPOFOL 1000 MG/100ML IV EMUL
INTRAVENOUS | Status: AC
  Filled 2021-05-10: qty 100

## 2021-05-10 MED ORDER — MAGNESIUM HYDROXIDE 400 MG/5ML PO SUSP
30.0000 mL | Freq: Every day | ORAL | Status: DC
  Administered 2021-05-10 – 2021-05-11 (×2): 30 mL via ORAL
  Filled 2021-05-10 (×2): qty 30

## 2021-05-10 MED ORDER — ACETAMINOPHEN 10 MG/ML IV SOLN
INTRAVENOUS | Status: AC
  Filled 2021-05-10: qty 100

## 2021-05-10 MED ORDER — BUPIVACAINE HCL (PF) 0.5 % IJ SOLN
INTRAMUSCULAR | Status: DC | PRN
  Administered 2021-05-10: 2.8 mL

## 2021-05-10 MED ORDER — PHENOL 1.4 % MT LIQD
1.0000 | OROMUCOSAL | Status: DC | PRN
  Filled 2021-05-10: qty 177

## 2021-05-10 MED ORDER — PHENYLEPHRINE HCL-NACL 20-0.9 MG/250ML-% IV SOLN
INTRAVENOUS | Status: DC | PRN
  Administered 2021-05-10: 20 ug/min via INTRAVENOUS

## 2021-05-10 MED ORDER — BUPIVACAINE-EPINEPHRINE (PF) 0.25% -1:200000 IJ SOLN
INTRAMUSCULAR | Status: DC | PRN
  Administered 2021-05-10: 20 mL

## 2021-05-10 MED ORDER — MIDAZOLAM HCL 5 MG/5ML IJ SOLN
INTRAMUSCULAR | Status: DC | PRN
  Administered 2021-05-10 (×2): 1 mg via INTRAVENOUS

## 2021-05-10 MED ORDER — SENNOSIDES-DOCUSATE SODIUM 8.6-50 MG PO TABS
1.0000 | ORAL_TABLET | Freq: Two times a day (BID) | ORAL | Status: DC
  Administered 2021-05-10 – 2021-05-11 (×2): 1 via ORAL
  Filled 2021-05-10 (×2): qty 1

## 2021-05-10 MED ORDER — PANTOPRAZOLE SODIUM 40 MG PO TBEC
40.0000 mg | DELAYED_RELEASE_TABLET | Freq: Two times a day (BID) | ORAL | Status: DC
  Administered 2021-05-10 – 2021-05-11 (×2): 40 mg via ORAL
  Filled 2021-05-10 (×2): qty 1

## 2021-05-10 MED ORDER — CEFAZOLIN SODIUM 1 G IJ SOLR
INTRAMUSCULAR | Status: AC
  Filled 2021-05-10: qty 10

## 2021-05-10 MED ORDER — MIDAZOLAM HCL 2 MG/2ML IJ SOLN
INTRAMUSCULAR | Status: AC
  Filled 2021-05-10: qty 2

## 2021-05-10 MED ORDER — ALUM & MAG HYDROXIDE-SIMETH 200-200-20 MG/5ML PO SUSP: 30.0000 mL | ORAL | Status: DC | PRN

## 2021-05-10 MED ORDER — BISACODYL 10 MG RE SUPP: 10.0000 mg | Freq: Every day | RECTAL | Status: DC | PRN

## 2021-05-10 MED ORDER — CELECOXIB 200 MG PO CAPS
ORAL_CAPSULE | ORAL | Status: AC
  Administered 2021-05-10: 400 mg via ORAL
  Filled 2021-05-10: qty 2

## 2021-05-10 MED ORDER — PHENYLEPHRINE HCL-NACL 20-0.9 MG/250ML-% IV SOLN
INTRAVENOUS | Status: AC
  Filled 2021-05-10: qty 250

## 2021-05-10 MED ORDER — INSULIN ASPART 100 UNIT/ML IJ SOLN
0.0000 [IU] | Freq: Three times a day (TID) | INTRAMUSCULAR | Status: DC
  Administered 2021-05-11: 5 [IU] via SUBCUTANEOUS
  Administered 2021-05-11: 8 [IU] via SUBCUTANEOUS
  Filled 2021-05-10 (×2): qty 1

## 2021-05-10 MED ORDER — OXYCODONE HCL 5 MG/5ML PO SOLN: 5.0000 mg | Freq: Once | ORAL | Status: AC | PRN

## 2021-05-10 MED ORDER — ONDANSETRON HCL 4 MG/2ML IJ SOLN: 4.0000 mg | Freq: Four times a day (QID) | INTRAMUSCULAR | Status: DC | PRN

## 2021-05-10 MED ORDER — ONDANSETRON HCL 4 MG PO TABS: 4.0000 mg | ORAL_TABLET | Freq: Four times a day (QID) | ORAL | Status: DC | PRN

## 2021-05-10 MED ORDER — TRAMADOL HCL 50 MG PO TABS
50.0000 mg | ORAL_TABLET | ORAL | Status: DC | PRN
  Administered 2021-05-11: 50 mg via ORAL
  Filled 2021-05-10: qty 1

## 2021-05-10 MED ORDER — ACETAMINOPHEN 10 MG/ML IV SOLN
1000.0000 mg | Freq: Four times a day (QID) | INTRAVENOUS | Status: DC
  Administered 2021-05-10 – 2021-05-11 (×3): 1000 mg via INTRAVENOUS
  Filled 2021-05-10 (×4): qty 100

## 2021-05-10 MED ORDER — NEOMYCIN-POLYMYXIN B GU 40-200000 IR SOLN
Status: AC
  Filled 2021-05-10: qty 2

## 2021-05-10 MED ORDER — ACETAMINOPHEN 10 MG/ML IV SOLN: 1000.0000 mg | Freq: Once | INTRAVENOUS | Status: DC | PRN

## 2021-05-10 MED ORDER — PHENYLEPHRINE HCL (PRESSORS) 10 MG/ML IV SOLN
INTRAVENOUS | Status: DC | PRN
  Administered 2021-05-10: 80 ug via INTRAVENOUS

## 2021-05-10 MED ORDER — FAMOTIDINE 20 MG PO TABS
ORAL_TABLET | ORAL | Status: AC
  Administered 2021-05-10: 20 mg via ORAL
  Filled 2021-05-10: qty 1

## 2021-05-10 MED ORDER — METOCLOPRAMIDE HCL 10 MG PO TABS
10.0000 mg | ORAL_TABLET | Freq: Three times a day (TID) | ORAL | Status: DC
  Administered 2021-05-10 – 2021-05-11 (×3): 10 mg via ORAL
  Filled 2021-05-10 (×3): qty 1

## 2021-05-10 MED ORDER — 0.9 % SODIUM CHLORIDE (POUR BTL) OPTIME
TOPICAL | Status: DC | PRN
Start: 2021-05-10 — End: 2021-05-10
  Administered 2021-05-10: 500 mL

## 2021-05-10 MED ORDER — ONDANSETRON HCL 4 MG/2ML IJ SOLN: 4.0000 mg | Freq: Once | INTRAMUSCULAR | Status: DC | PRN

## 2021-05-10 MED ORDER — CEFAZOLIN SODIUM-DEXTROSE 2-4 GM/100ML-% IV SOLN
INTRAVENOUS | Status: AC
  Administered 2021-05-10: 2 g via INTRAVENOUS
  Filled 2021-05-10: qty 100

## 2021-05-10 MED ORDER — OXYCODONE HCL 5 MG PO TABS: 5.0000 mg | ORAL_TABLET | ORAL | Status: DC | PRN

## 2021-05-10 MED ORDER — CEFAZOLIN SODIUM-DEXTROSE 1-4 GM/50ML-% IV SOLN: INTRAVENOUS | Status: DC | PRN

## 2021-05-10 MED ORDER — NEOMYCIN-POLYMYXIN B GU 40-200000 IR SOLN
Status: DC | PRN
  Administered 2021-05-10: 2 mL

## 2021-05-10 MED ORDER — METOPROLOL TARTRATE 5 MG/5ML IV SOLN
INTRAVENOUS | Status: AC
  Filled 2021-05-10: qty 5

## 2021-05-10 MED ORDER — DEXTROSE 5 % IV SOLN
INTRAVENOUS | Status: DC | PRN
  Administered 2021-05-10: 3 g via INTRAVENOUS

## 2021-05-10 MED ORDER — CEFAZOLIN SODIUM-DEXTROSE 2-4 GM/100ML-% IV SOLN
2.0000 g | Freq: Four times a day (QID) | INTRAVENOUS | Status: AC
  Administered 2021-05-10: 2 g via INTRAVENOUS
  Filled 2021-05-10 (×2): qty 100

## 2021-05-10 MED ORDER — SODIUM CHLORIDE 0.9 % IV SOLN: INTRAVENOUS | Status: DC

## 2021-05-10 MED ORDER — OXYCODONE HCL 5 MG PO TABS: 10.0000 mg | ORAL_TABLET | ORAL | Status: DC | PRN

## 2021-05-10 MED ORDER — HYDROMORPHONE HCL 1 MG/ML IJ SOLN: 0.5000 mg | INTRAMUSCULAR | Status: DC | PRN

## 2021-05-10 MED ORDER — DIPHENHYDRAMINE HCL 12.5 MG/5ML PO ELIX: 12.5000 mg | ORAL_SOLUTION | ORAL | Status: DC | PRN

## 2021-05-10 MED ORDER — BUPIVACAINE-EPINEPHRINE (PF) 0.25% -1:200000 IJ SOLN
INTRAMUSCULAR | Status: AC
  Filled 2021-05-10: qty 30

## 2021-05-10 MED ORDER — ACETAMINOPHEN 325 MG PO TABS: 325.0000 mg | ORAL_TABLET | Freq: Four times a day (QID) | ORAL | Status: DC | PRN

## 2021-05-10 MED ORDER — CELECOXIB 200 MG PO CAPS
200.0000 mg | ORAL_CAPSULE | Freq: Two times a day (BID) | ORAL | Status: DC
  Administered 2021-05-10 – 2021-05-11 (×2): 200 mg via ORAL
  Filled 2021-05-10 (×2): qty 1

## 2021-05-10 MED ORDER — ACETAMINOPHEN 10 MG/ML IV SOLN
INTRAVENOUS | Status: DC | PRN
  Administered 2021-05-10: 1000 mg via INTRAVENOUS

## 2021-05-10 SURGICAL SUPPLY — 45 items
BRACE T-SCOPE KNEE POSTOP (MISCELLANEOUS) ×1 IMPLANT
CUFF TOURN SGL QUICK 34 (TOURNIQUET CUFF) ×1
CUFF TRNQT CYL 34X4.125X (TOURNIQUET CUFF) IMPLANT
DRAPE C-ARM XRAY 36X54 (DRAPES) ×2 IMPLANT
DRAPE C-ARMOR (DRAPES) ×2 IMPLANT
DRSG DERMACEA 8X12 NADH (GAUZE/BANDAGES/DRESSINGS) ×2 IMPLANT
DRSG OPSITE POSTOP 4X10 (GAUZE/BANDAGES/DRESSINGS) ×1 IMPLANT
DURAPREP 26ML APPLICATOR (WOUND CARE) ×4 IMPLANT
ELECT CAUTERY BLADE 6.4 (BLADE) ×2 IMPLANT
ELECT REM PT RETURN 9FT ADLT (ELECTROSURGICAL) ×2
ELECTRODE REM PT RTRN 9FT ADLT (ELECTROSURGICAL) ×1 IMPLANT
GAUZE SPONGE 4X4 12PLY STRL (GAUZE/BANDAGES/DRESSINGS) ×2 IMPLANT
GLOVE SURG ENC TEXT LTX SZ7.5 (GLOVE) ×2 IMPLANT
GLOVE SURG UNDER LTX SZ8 (GLOVE) ×2 IMPLANT
GOWN STRL REUS W/ TWL LRG LVL3 (GOWN DISPOSABLE) ×2 IMPLANT
GOWN STRL REUS W/TWL LRG LVL3 (GOWN DISPOSABLE) ×2
GUIDEWIRE TT 3.5/4.0 1.4X150 (WIRE) ×3 IMPLANT
HEMOVAC 400CC 10FR (MISCELLANEOUS) ×1 IMPLANT
IV CATH ANGIO 14GX3.25 ORG (MISCELLANEOUS) ×2 IMPLANT
KIT TEMPLATE PATELLA ANTERIOR (ORTHOPEDIC DISPOSABLE SUPPLIES) ×1 IMPLANT
KIT TURNOVER KIT A (KITS) ×2 IMPLANT
MANIFOLD NEPTUNE II (INSTRUMENTS) ×2 IMPLANT
NDL HYPO 25X1 1.5 SAFETY (NEEDLE) ×1 IMPLANT
NEEDLE HYPO 25X1 1.5 SAFETY (NEEDLE) IMPLANT
NS IRRIG 500ML POUR BTL (IV SOLUTION) ×2 IMPLANT
PACK TOTAL KNEE (MISCELLANEOUS) ×2 IMPLANT
PLATE 2.7 PATELLA ANT LOCKING (Plate) ×1 IMPLANT
SCREW CAN HDLESS 4.0X42 (Screw) ×2 IMPLANT
SCREW CORTEX 2.7 SLF-TPNG 16MM (Screw) ×1 IMPLANT
SCREW LOCKING 2.7X28 (Screw) ×1 IMPLANT
SOL PREP PVP 2OZ (MISCELLANEOUS) ×2
SOLUTION PREP PVP 2OZ (MISCELLANEOUS) ×1 IMPLANT
STAPLER SKIN PROX 35W (STAPLE) ×2 IMPLANT
SUCTION FRAZIER HANDLE 10FR (MISCELLANEOUS) ×1
SUCTION TUBE FRAZIER 10FR DISP (MISCELLANEOUS) ×1 IMPLANT
SUT FIBERWIRE #5 38 CONV BLUE (SUTURE)
SUT ORTHOCORD W/MULTIPK NDL (SUTURE) ×1 IMPLANT
SUT STEEL 7 (SUTURE) IMPLANT
SUT VIC AB 0 CT1 36 (SUTURE) ×2 IMPLANT
SUT VIC AB 1 CT1 36 (SUTURE) ×1 IMPLANT
SUT VIC AB 2-0 CT1 27 (SUTURE) ×1
SUT VIC AB 2-0 CT1 TAPERPNT 27 (SUTURE) ×1 IMPLANT
SUTURE FIBERWR #5 38 CONV BLUE (SUTURE) ×2 IMPLANT
SYR 10ML LL (SYRINGE) ×2 IMPLANT
SYR 30ML LL (SYRINGE) ×1 IMPLANT

## 2021-05-10 NOTE — Anesthesia Procedure Notes (Signed)
Spinal  Patient location during procedure: OR Start time: 05/10/2021 11:50 AM End time: 05/10/2021 11:58 AM Reason for block: surgical anesthesia Staffing Performed: anesthesiologist  Anesthesiologist: Arita Miss, MD Resident/CRNA: Jerrye Noble, CRNA Preanesthetic Checklist Completed: patient identified, IV checked, site marked, risks and benefits discussed, surgical consent, monitors and equipment checked, pre-op evaluation and timeout performed Spinal Block Patient position: sitting Prep: ChloraPrep Patient monitoring: heart rate, continuous pulse ox, blood pressure and cardiac monitor Approach: midline Location: L3-4 Injection technique: single-shot Needle Needle type: Quincke  Needle gauge: 22 G Needle length: 9 cm Assessment Sensory level: T10 Events: CSF return Additional Notes Attempt x 2 with 24g Pencan by CRNA unsuccessful.  Attempt x 1 with Quinke by MDA successful.  Negative paresthesia. Negative blood return. Positive free-flowing CSF. Expiration date of kit checked and confirmed. Patient tolerated procedure well, without complications.

## 2021-05-10 NOTE — H&P (Signed)
ORTHOPAEDIC HISTORY & PHYSICAL [name], [place] - 05/03/2021 10:45 AM EST Formatting of this note is different from the original. [place] [facility] Chief Complaint:   Chief Complaint  Patient presents with   Knee Pain  POST OP ORIF LEFT PATELLA; SX: 04/16/2021   History of Present Illness:   Kristopher Oneal is a 71 y.o. male that presents 2 weeks s/p ORIF of left periprosthetic patella fracture 04/16/21 by Dr Marry Guan. Patient presents with his wife.  Pain is minimal at this time. Patient has been seeing physical therapy at home. He has been wearing the brace in full extension. He denies any problems with the surgical incision.   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   Past Surgical History:  Past Surgical History:  Procedure Laterality Date   LAMINECTOMY LUMBAR SPINE 1993   Right total knee arthroplasty using computer-assisted navigation 01/02/2020  Dr Marry Guan   Left total knee arthroplasty using computer-assisted navigation 11/19/2020  Dr Marry Guan   Open reduction and internal fixation of the left periprosthetic patella fracture 04/16/2021  Dr Marry Guan   Current Medications:  Current Outpatient Medications  Medication Sig Dispense Refill   ascorbic acid, vitamin C, (VITAMIN C) 1000 MG tablet Take 1,000 mg by mouth once daily Per patient   BAYER ASPIRIN 325 mg EC tablet Take 325 mg by mouth daily with breakfast   celecoxib (CELEBREX) 200 MG capsule Take 200 mg by mouth 2 (two) times daily   cyanocobalamin (VITAMIN B12) 500 MCG  tablet Take by mouth   diltiazem (CARDIZEM CD) 240 MG CD capsule TAKE 1 CAPSULE BY MOUTH IN THE EVENING 90 capsule 3   fenofibrate 160 MG tablet TAKE 1 TABLET BY MOUTH ONCE DAILY (Patient taking differently: Take 160 mg by mouth once daily) 90 tablet 3   flash glucose sensor (FREESTYLE LIBRE 14 DAY SENSOR) kit Use 1 kit every 14 (fourteen) days 2 kit 12   fluticasone propionate (FLONASE) 50 mcg/actuation nasal spray Place 1 spray into both nostrils once daily as needed   gabapentin (NEURONTIN) 300 MG capsule TAKE 3 CAPSULES BY MOUTH AT NIGHT 270 capsule 1   insulin NPH-REGULAR (HUMULIN 70/30 PEN) 100 unit/mL (70-30) pen injector Inject 35 Units subcutaneously nightly Novolin 70/30, 45 units in the morning before breakfast and 35 units   lovastatin (MEVACOR) 40 MG tablet TAKE 1 TABLET BY MOUTH DAILY WITH DINNER 90 tablet 1   metFORMIN (GLUCOPHAGE) 1000 MG tablet Take 1 tablet (1,000 mg total) by mouth 2 (two) times daily Take 1 tablet by mouth (1000 mg total) twice daily 180 tablet 3   metoprolol succinate (TOPROL-XL) 100 MG XL tablet TAKE 1 TABLET BY MOUTH ONCE DAILY (Patient taking differently: Take 100 mg by mouth once daily) 90 tablet 3   multivitamin tablet Take 1 tablet by mouth once daily For diabetics   oxyCODONE (ROXICODONE) 5 MG immediate release tablet Take 5 mg by mouth every 6 (six) hours as needed   pen needle, diabetic 32 gauge x 5/32" Ndle Use 1 each 2 (two) times daily 100 each 4   traMADoL (ULTRAM) 50 mg tablet TAKE 1  TO 2 TABLETS BY MOUTH EVERY 4 HOURS AS NEEDED FOR MODERATE PAIN   venlafaxine (EFFEXOR-XR) 37.5 MG XR capsule Take 1 capsule (37.5 mg total) by mouth once daily 90 capsule 3   glipiZIDE (GLUCOTROL) 5 MG tablet Take 1 tablet (5 mg total) by mouth 2 (two) times daily for 90 days 180 tablet 3   No current facility-administered medications for this visit.   Allergies: No Known Allergies  Social History:  Social History   Socioeconomic History   Marital status:  Married  Spouse name: Helene Kelp   Number of children: 1   Years of education: 2  Occupational History   Occupation: Animator- IT / Computers  Tobacco Use   Smoking status: Former  Types: Cigarettes  Quit date: 1980  Years since quitting: 43.0   Smokeless tobacco: Never  Vaping Use   Vaping Use: Never used  Substance and Sexual Activity   Alcohol use: No   Drug use: Never   Sexual activity: Defer  Partners: Female   Family History:  Family History  Problem Relation Age of Onset   Alzheimer's disease Mother   No Known Problems Father   Review of Systems:   A 10+ ROS was performed, reviewed, and the pertinent orthopaedic findings are documented in the HPI.   Physical Examination:   BP (!) 140/80 (BP Location: Left upper arm, Patient Position: Sitting, BP Cuff Size: Large Adult)   Ht 188 cm (_0 )   Wt (!) 124.1 kg (273 lb 9.6 oz)   BMI 35.13 kg/m   Patient is a well-developed, well-nourished male in no acute distress. Patient has normal mood and affect. Patient is alert and oriented to person, place, and time. Pupils are equal and round with synchronous movement. No injected sclera. Respirations are normal, without noticeable retractions.   Patient ambulates in a wheelchair with brace locked in extension.   Cardiovascular: Regular rate and rhythm, no murmur/rub/gallops. Distal pulses palpable.   Respiratory: Lungs clear to auscultation bilaterally.  The patient is able to actively flex the left hip.   Patient's left knee is hanging at approximately 50 degrees upon entering the room.Skin over the left knee is clean and dry. The incision is well-healed, without erythema, drainage, or signs of infection. Staples remain in place. Mild edema noted over the left knee. Range of motion not tested.   The patient is able to plantarflex and dorsiflex the left ankle.   Patient is able to flex and extend the left hallux. Sensation is slightly decreased to light touch over the  saphenous, lateral sural cutaneous, superficial fibular, and deep fibular nerve distributions. Dorsalis pedis, posterior tibialis 2+.  Tests Performed/Reviewed:  X-rays  Lateral view of the left knee obtained. Images reveal loss of reduction of previously reviewed ORIF of left patella. Approximately 18 mm of displacement is noted of the superior patellar fragment. Femoral and tibial components are shown without periprosthetic lucency or fracture.  I personally ordered and interpreted today's x-rays.   Impression:   ICD-10-CM  1. S/P ORIF (open reduction internal fixation) fracture Z98.890  Z87.81  2. Periprosthetic fracture around internal prosthetic left knee joint, initial encounter M97.12XA  3. S/P total knee replacement using cement, left Z96.652  4. Severe obesity (BMI 35.0-39.9) with comorbidity (CMS-HCC) E66.01  5. DM type 2 with diabetic mixed hyperlipidemia (CMS-HCC) E11.69  E78.2   Plan:   -Status post left total knee arthroplasty by Dr. Marry Guan on 11/19/2020 -s/p ORIF of left patella fracture 04/16/2021 -failure of hardware -periprosthetic  fracture of left patella following total knee arthroplasty Patient's xrays show loss of reduction at the fracture site. Dr Marry Guan was present and spoke with patient, advising that the injury would require repeat open reduction and internal fixation of the patellar fracture. Discussed likely use of a patellar plate as well as a tension band for fixation. Discussed use of different locking knee brace postoperatively. Risks of surgery were discussed. Patient would like to proceed with repeat open reduction and internal fixation of left patellar fracture.  Patient is to follow-up in 1 weeks post-operatively.  Contact our office with any questions or concerns. Follow up as indicated, or sooner should any new problems arise, if conditions worsen, or if they are otherwise concerned.   [name], PA-C [facility] [place], [place] [address] Phone: [phone]  This note was generated in part with voice recognition software and I apologize for any typographical errors that were not detected and corrected.  Electronically signed by [name], PA at 05/06/2021 11:03 AM EST

## 2021-05-10 NOTE — Transfer of Care (Signed)
Immediate Anesthesia Transfer of Care Note  Patient: Kristopher Oneal  Procedure(s) Performed: OPEN REDUCTION INTERNAL (ORIF) FIXATION PATELLA (Left: Knee)  Patient Location: PACU  Anesthesia Type:Spinal  Level of Consciousness: awake, alert  and oriented  Airway & Oxygen Therapy: Patient Spontanous Breathing  Post-op Assessment: Report given to RN and Post -op Vital signs reviewed and stable  Post vital signs: Reviewed and stable  Last Vitals:  Vitals Value Taken Time  BP 112/63 05/10/21 1652  Temp    Pulse 92 05/10/21 1652  Resp 24 05/10/21 1652  SpO2 98 % 05/10/21 1652  Vitals shown include unvalidated device data.  Last Pain:  Vitals:   05/10/21 1023  TempSrc: Oral         Complications: No notable events documented.

## 2021-05-10 NOTE — Anesthesia Preprocedure Evaluation (Signed)
Anesthesia Evaluation  Patient identified by MRN, date of birth, ID band Patient awake    Reviewed: Allergy & Precautions, NPO status , Patient's Chart, lab work & pertinent test results, reviewed documented beta blocker date and time   History of Anesthesia Complications Negative for: history of anesthetic complications  Airway Mallampati: II       Dental  (+) Dental Advidsory Given, Teeth Intact   Pulmonary neg pulmonary ROS, neg sleep apnea, former smoker,    Pulmonary exam normal breath sounds clear to auscultation       Cardiovascular Exercise Tolerance: Good hypertension, Pt. on medications and Pt. on home beta blockers (-) angina(-) Past MI and (-) Cardiac Stents + dysrhythmias Atrial Fibrillation + Valvular Problems/Murmurs  Rhythm:Regular Rate:Normal  Unremarkable TTE in 2020   Neuro/Psych PSYCHIATRIC DISORDERS Anxiety negative neurological ROS     GI/Hepatic negative GI ROS, Neg liver ROS,   Endo/Other  diabetes, Poorly Controlled, Type 2, Insulin Dependent, Oral Hypoglycemic Agents  Renal/GU negative Renal ROS  negative genitourinary   Musculoskeletal  (+) Arthritis ,   Abdominal (+) + obese,   Peds negative pediatric ROS (+)  Hematology negative hematology ROS (+)   Anesthesia Other Findings Past Medical History: No date: Arthritis No date: Diabetes mellitus without complication (HCC)     Comment:  type 2 No date: Dysrhythmia     Comment:  PVC No date: Hypertension  Reproductive/Obstetrics                             Anesthesia Physical  Anesthesia Plan  ASA: 3  Anesthesia Plan: Spinal   Post-op Pain Management: Celebrex PO (pre-op)   Induction: Intravenous  PONV Risk Score and Plan: 2 and TIVA, Propofol infusion, Ondansetron and Dexamethasone  Airway Management Planned: Natural Airway and Simple Face Mask  Additional Equipment: None  Intra-op Plan:    Post-operative Plan:   Informed Consent: I have reviewed the patients History and Physical, chart, labs and discussed the procedure including the risks, benefits and alternatives for the proposed anesthesia with the patient or authorized representative who has indicated his/her understanding and acceptance.     Dental advisory given  Plan Discussed with: Surgeon and CRNA  Anesthesia Plan Comments: (Discussed R/B/A of neuraxial anesthesia technique with patient: - rare risks of spinal/epidural hematoma, nerve damage, infection - Risk of PDPH - Risk of nausea and vomiting - Risk of conversion to general anesthesia and its associated risks, including sore throat, damage to lips/eyes/teeth/oropharynx, and rare risks such as cardiac and respiratory events. - Risk of allergic reactions  Discussed the role of CRNA in patient's perioperative care.  Patient voiced understanding.)        Anesthesia Quick Evaluation

## 2021-05-10 NOTE — Op Note (Signed)
OPERATIVE NOTE  DATE OF SURGERY:  05/10/2021  PATIENT NAME:  Kristopher Oneal   DOB: 1950/09/01  MRN: [id number]  PRE-OPERATIVE DIAGNOSIS: Hardware failure status post ORIF of left patella fracture   POST-OPERATIVE DIAGNOSIS:  Same  PROCEDURE:  Open reduction and internal fixation of the left patella; removal of retained hardware  SURGEON:  Jena Gauss. M.D.  ASSISTANT:  None  ANESTHESIA: spinal  ESTIMATED BLOOD LOSS: Minimal  FLUIDS REPLACED: 1200 mL of crystalloid  TOURNIQUET TIME: 90 minutes  DRAINS: None  IMPLANTS UTILIZED: 2 - 4.0 mm x 42 mm headless cannulated compression screws, 1.6 mm wire  INDICATIONS FOR SURGERY: Kristopher Oneal is a 71 y.o. year old male who previously underwent open reduction internal fixation of a left periprosthetic patella fracture.  At the time of follow-up he was noted to have backing out of the K wires with loss of fixation from the tension band wire construct. After discussion of the risks and benefits of surgical intervention, the patient expressed understanding of the risks benefits and agree with plans for open reduction and internal fixation of the fracture.   The risks, benefits, and alternatives were discussed at length including but not limited to the risks of infection, bleeding, nerve injury, stiffness, blood clots, the need for revision surgery, cardiopulmonary complications, among others, and they were willing to proceed.  PROCEDURE IN DETAIL: The patient was brought into the operating room and, after adequate spinal anesthesia was achieved, a tourniquet was placed on the patient's upper thigh. The patient's knee and leg were cleaned and prepped with alcohol and DuraPrep and draped in the usual sterile fashion. A "timeout" was performed as per usual protocol. The lower extremity was exsanguinated using an Esmarch, and the tourniquet was inflated to 300 mmHg. An anterior longitudinal incision was made and dissection was carried down to the  retinaculum.  The previously placed tension band wire was encountered and cut for removal.  Next, the proximal portion of the parallel K wires were identified and these were removed.  The fracture site was inspected and irrigated with copious amounts of normal saline with antibiotic solution.  The fracture site was debrided of fibrotic tissue and hematoma using curettes and rongeurs.  Tourniquet was deflated after initial tourniquet time of 90 minutes.  Two 1.6 mm guidewires were inserted parallel fashion in antegrade fashion into the distal fragment.  The fracture was reduced and maintained in position using bone reduction forceps.  Reduction was confirmed using the C arm.  The guide wires were then directed through the superior patellar fragment in a retrograde fashion.. Again, reduction and position of K wires was confirmed using the C-arm.  Measurements were obtained and two 4.0 mm x 42 mm headless cannulated compression screws were inserted over the guidewires with good compression of the fracture site noted.  A 1.6 mm wire was passed through the cannulated compression screws and adjusted to form a figure-of-eight pattern. The wire was tensioned and then twisted. Excellent compression at the fracture site was appreciated.  The knee was flexed to 90 degrees and the reduction verified again using the C-arm in both AP and lateral planes.  Hemostasis was achieved using electrocautery. The retinaculum was repaired using #1 Ethibond. Subcutaneous tissue was approximated in layers using first #0 Vicryl followed by #2-0 Vicryl. Skin was closed with skin staples. 0.25% Marcaine with epinephrine was injected along the incision site. A sterile dressing was applied followed by application of a knee range of motion brace which was  locked in extension.  The patient tolerated the procedure well and was transported to the recovery room in stable condition.  Kristopher Oneal P. Angie Fava M.D.

## 2021-05-10 NOTE — H&P (Signed)
The patient has been re-examined, and the chart reviewed, and there have been no interval changes to the documented history and physical.    The risks, benefits, and alternatives have been discussed at length. The patient expressed understanding of the risks benefits and agreed with plans for surgical intervention.  Alesia Oshields P. Kinzleigh Kandler, Jr. M.D.    

## 2021-05-11 DIAGNOSIS — T84498A Other mechanical complication of other internal orthopedic devices, implants and grafts, initial encounter: Secondary | ICD-10-CM | POA: Diagnosis not present

## 2021-05-11 LAB — GLUCOSE, CAPILLARY
Glucose-Capillary: 219 mg/dL — ABNORMAL HIGH (ref 70–99)
Glucose-Capillary: 266 mg/dL — ABNORMAL HIGH (ref 70–99)

## 2021-05-11 MED ORDER — CELECOXIB 200 MG PO CAPS: 200.0000 mg | ORAL_CAPSULE | Freq: Two times a day (BID) | ORAL | 0 refills | Status: DC

## 2021-05-11 MED ORDER — OXYCODONE HCL 5 MG PO TABS: 5.0000 mg | ORAL_TABLET | ORAL | 0 refills | Status: DC | PRN

## 2021-05-11 MED ORDER — ASPIRIN 325 MG PO TBEC: 325.0000 mg | DELAYED_RELEASE_TABLET | Freq: Every day | ORAL | 0 refills | Status: DC

## 2021-05-11 MED ORDER — TRAMADOL HCL 50 MG PO TABS: 50.0000 mg | ORAL_TABLET | ORAL | 0 refills | Status: DC | PRN

## 2021-05-11 NOTE — TOC Transition Note (Signed)
Transition of Care [facility]) - CM/SW Discharge Note   Patient Details  Name: Kristopher Oneal MRN: [id number] Date of Birth: 1951/02/22  Transition of Care [facility]) CM/SW Contact:  Maud Deed, LCSW Phone Number: 05/11/2021, 10:30 AM   Clinical Narrative:    Pt medically stable for discharge per MD. Pt will be transported home by his spouse Kristopher Oneal. CSW spoke with Kristopher Oneal and arranged HHPT  with [facility]. No DME needed at this time.    Final next level of care: Home w Home Health Services Barriers to Discharge: No Barriers Identified   Patient Goals and CMS Choice   CMS Medicare.gov Compare Post Acute Care list provided to:: Patient Choice offered to / list presented to : Patient  Discharge Placement                Patient to be transferred to facility by: Kristopher Oneal Name of family member notified: Kristopher Oneal Patient and family notified of of transfer: 05/11/21  Discharge Plan and Services                DME Arranged: Patient refused services         HH Arranged: PT HH Agency: [facility] Date [facility] Agency Contacted: 05/11/21 Time HH Agency Contacted: 1030 Representative spoke with at [facility] Agency: Kristopher Oneal Pack  Social Determinants of Health (SDOH) Interventions     Readmission Risk Interventions No flowsheet data found.

## 2021-05-11 NOTE — Plan of Care (Signed)
Patient discharged home per MD orders at this time.All discharge instructions,education and medications reviewed with the Pt at the bedside.patient expressed understanding and will comply with dc instructions.follow up appointments was also communicated to the patient.no verbal c/o or any ssx of distress.Pt was discharged home with HH/PT/OT services per order.patient was transported home by wife in a privately own vehicle.

## 2021-05-11 NOTE — Evaluation (Signed)
Physical Therapy Evaluation Patient Details Name: Kristopher Oneal MRN: [id number] DOB: 08-01-1950 Today's Date: 05/11/2021  History of Present Illness  71 y/o male s/p L TKA 8/22, had recovered well and manging independently.  Was coming down steps from his RV and felt a pop, subsequently found to have peri-operative patellar fx needing ORIF (04/16/21).  Clinical Impression  Pt able to move confidently in the bed, needed heavy UE use to rise from standard heigh bed but able to do so w/o direct assist.  Pt needed only brief reminders for appropriate stair negotiation, which he was able to manage w/o issue.  Pt doing well and should be safe to return home, HHPT or other per surgeon recommendations.       Recommendations for follow up therapy are one component of a multi-disciplinary discharge planning process, led by the attending physician.  Recommendations may be updated based on patient status, additional functional criteria and insurance authorization.  Follow Up Recommendations Follow physician's recommendations for discharge plan and follow up therapies    Assistance Recommended at Discharge Set up Supervision/Assistance  Patient can return home with the following       Equipment Recommendations None recommended by PT  Recommendations for Other Services       Functional Status Assessment       Precautions / Restrictions Precautions Precautions: Fall;Knee Precaution Comments: knee in KI full extension Required Braces or Orthoses: Knee Immobilizer - Left Restrictions Weight Bearing Restrictions: Yes LLE Weight Bearing: Weight bearing as tolerated Other Position/Activity Restrictions: KI donned      Mobility  Bed Mobility Overal bed mobility: Independent             General bed mobility comments: Pt easily gets himself sitting EOB w/o assist    Transfers Overall transfer level: Modified independent Equipment used: Rolling walker (2 wheels)               General  transfer comment: Pt initially struggled to try rising with just the R LE from standard height bed, cues to insure good weight over UE to assist with with heavy UE use and much effort was safely able to rise w/o assist    Ambulation/Gait Ambulation/Gait assistance: Modified independent (Device/Increase time) Gait Distance (Feet): 300 Feet Assistive device: Rolling walker (2 wheels)         General Gait Details: Apart from KI changing mechanics pt did very well with ambulation, maintaining confident and consistent cadence and with no LOBs or other safety issues.  Pt's vitals stable, good confidence and tolerance.  Stairs Stairs: Yes Stairs assistance: Supervision Stair Management: One rail Left, Sideways Number of Stairs: 4 General stair comments: Pt able to ascend and descend steps w/o issues, minimal reminders for appropriate strategy and sequencing.  Wheelchair Mobility    Modified Rankin (Stroke Patients Only)       Balance Overall balance assessment: Modified Independent                                           Pertinent Vitals/Pain Pain Assessment Pain Assessment: No/denies pain    Home Living Family/patient expects to be discharged to:: Private residence Living Arrangements: Spouse/significant other Available Help at Discharge: Family;Available 24 hours/day Type of Home: Other(Comment) (RV) Home Access: Stairs to enter Entrance Stairs-Rails: Left Entrance Stairs-Number of Steps: 4 to enter RV with rail on L side   Home Layout: Multi-level  Home Equipment: Agricultural consultant (2 wheels);Shower seat - built in;Cane - single point      Prior Function Prior Level of Function : Independent/Modified Independent             Mobility Comments: Pt had functionally recovered well from TKA and more recent ORIF, not needing AD and back to active independence       Hand Dominance        Extremity/Trunk Assessment   Upper Extremity  Assessment Upper Extremity Assessment: Overall WFL for tasks assessed    Lower Extremity Assessment Lower Extremity Assessment: Overall WFL for tasks assessed       Communication   Communication: No difficulties  Cognition Arousal/Alertness: Awake/alert Behavior During Therapy: WFL for tasks assessed/performed Overall Cognitive Status: Within Functional Limits for tasks assessed                                          General Comments General comments (skin integrity, edema, etc.): Pt was able to perform all mobility and ambulation tasks w/o assist.Feeling confident and ready to go home.    Exercises     Assessment/Plan    PT Assessment Patient needs continued PT services  PT Problem List Decreased strength;Decreased activity tolerance;Decreased mobility;Decreased safety awareness;Decreased knowledge of use of DME;Pain;Decreased range of motion       PT Treatment Interventions DME instruction;Stair training;Gait training;Functional mobility training;Therapeutic activities;Therapeutic exercise;Balance training;Neuromuscular re-education;Patient/family education    PT Goals (Current goals can be found in the Care Plan section)  Acute Rehab PT Goals Patient Stated Goal: get back to working PT Goal Formulation: With patient Time For Goal Achievement: 05/25/21 Potential to Achieve Goals: Good    Frequency 7X/week     Co-evaluation               AM-PAC PT "6 Clicks" Mobility  Outcome Measure Help needed turning from your back to your side while in a flat bed without using bedrails?: None Help needed moving from lying on your back to sitting on the side of a flat bed without using bedrails?: None Help needed moving to and from a bed to a chair (including a wheelchair)?: None Help needed standing up from a chair using your arms (e.g., wheelchair or bedside chair)?: None Help needed to walk in hospital room?: None Help needed climbing 3-5 steps with  a railing? : A Little 6 Click Score: 23    End of Session Equipment Utilized During Treatment: Gait belt Activity Tolerance: Patient tolerated treatment well Patient left: with chair alarm set;with call bell/phone within reach Nurse Communication: Mobility status PT Visit Diagnosis: Muscle weakness (generalized) (M62.81);Difficulty in walking, not elsewhere classified (R26.2);Pain Pain - Right/Left: Left Pain - part of body: Knee    Time: 3151-7616 PT Time Calculation (min) (ACUTE ONLY): 31 min   Charges:   PT Evaluation $PT Eval Low Complexity: 1 Low PT Treatments $Gait Training: 8-22 mins        Malachi Pro, DPT 05/11/2021, 9:59 AM

## 2021-05-11 NOTE — Progress Notes (Signed)
OT Cancellation Note  Patient Details Name: Rece Zechman MRN: [id number] DOB: 27-Dec-1950   Cancelled Treatment:    Reason Eval/Treat Not Completed: OT screened, no needs identified, will sign off OT stopped by room to assess pt's knowledge of OT services as well as LB task modification as it pertains to dressing/bathing given current restrictions. Pt and spouse with good retention and understanding. Do not require further OT assessment and education at this time. Mobility-wise performing safely and well per PT. No acute OT needs perceived. Thank you.  Rejeana Brock, MS, OTR/L ascom [phone] 05/11/21, 10:59 AM   .

## 2021-05-11 NOTE — Discharge Summary (Addendum)
Physician Discharge Summary  Patient ID: Kristopher Oneal MRN: [id number] DOB/AGE: Nov 28, 1950 71 y.o.  Admit date: 05/10/2021 Discharge date: 05/11/2021  Admission Diagnoses:  S/P ORIF (open reduction internal fixation) fracture [Z98.890, Z87.81]  Discharge Diagnoses: Patient Active Problem List   Diagnosis Date Noted   S/P ORIF (open reduction internal fixation) fracture 05/10/2021   Patella fracture 04/16/2021   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/19/2020   Status post total right knee replacement 01/18/2020   Atrial fibrillation and flutter (HCC) 07/09/2018   DM type 2 with diabetic mixed hyperlipidemia (HCC) 02/23/2018   B12 deficiency 07/17/2016   Benign essential hypertension 07/17/2016   Chronic painful diabetic neuropathy (HCC) 07/17/2016   Hyperlipidemia, mixed 07/17/2016   Symptomatic PVCs 07/17/2016    Past Medical History:  Diagnosis Date   Anxiety    Arthritis    Diabetes mellitus without complication (HCC)    type 2   Dysrhythmia    PVC/ a fib   Heart murmur    High cholesterol    Hypertension      Transfusion: None.   Consultants (if any):   Discharged Condition: Improved  Hospital Course: Kristopher Oneal is an 71 y.o. male who was admitted 05/10/2021 with a diagnosis of hardware failure status post ORIF of left patella fracture and went to the operating room on 05/10/2021 and underwent the above named procedures.    Surgeries: Procedure(s): OPEN REDUCTION INTERNAL (ORIF) FIXATION PATELLA on 05/10/2021 Patient tolerated the surgery well. Taken to PACU where she was stabilized and then transferred to the orthopedic floor.  Continued on aspirin 325mg  daily following surgery. Heels elevated on bed with rolled towels. No evidence of DVT. Negative Homan. Physical therapy started on day #1 for gait training and transfer. OT started day #1 for ADL and assisted devices.  Patient's IV was removed on POD1. Foley was placed and removed in the OR.  Implants: 2 -  4.0 mm x 42 mm headless cannulated compression screws, 1.6 mm wire  He was given perioperative antibiotics:  Anti-infectives (From admission, onward)    Start     Dose/Rate Route Frequency Ordered Stop   05/10/21 1830  ceFAZolin (ANCEF) IVPB 2g/100 mL premix        2 g 200 mL/hr over 30 Minutes Intravenous Every 6 hours 05/10/21 1746 05/11/21 0000   05/10/21 1025  ceFAZolin (ANCEF) 2-4 GM/100ML-% IVPB       Note to Pharmacy: 05/12/21 L: cabinet override      05/10/21 1025 05/11/21 0000   05/10/21 0600  ceFAZolin (ANCEF) IVPB 2g/100 mL premix  Status:  Discontinued        2 g 200 mL/hr over 30 Minutes Intravenous On call to O.R. 05/09/21 2312 05/10/21 1813     .  He was given sequential compression devices, early ambulation for DVT prophylaxis.  He benefited maximally from the hospital stay and there were no complications.    Recent vital signs:  Vitals:   05/11/21 0606 05/11/21 0807  BP: 121/79 (!) 141/78  Pulse: 87 96  Resp: 16 18  Temp: (!) 97.1 F (36.2 C) (!) 97.5 F (36.4 C)  SpO2: 97% 97%    Recent laboratory studies:  Lab Results  Component Value Date   HGB 14.9 04/16/2021   HGB 15.3 11/19/2020   HGB 15.3 11/08/2020   Lab Results  Component Value Date   WBC 8.5 04/16/2021   PLT 274 04/16/2021   Lab Results  Component Value Date   INR 1.0  11/08/2020   Lab Results  Component Value Date   NA 136 04/16/2021   K 4.2 04/16/2021   CL 102 04/16/2021   CO2 23 04/16/2021   BUN 34 (H) 04/16/2021   CREATININE 1.07 04/16/2021   GLUCOSE 268 (H) 04/16/2021    Discharge Medications:   Allergies as of 05/11/2021   No Known Allergies      Medication List     TAKE these medications    Advanced Diabetic Multivitamin Tabs Take 1 tablet by mouth daily.   aspirin 325 MG EC tablet Take 1 tablet (325 mg total) by mouth daily with breakfast.   celecoxib 200 MG capsule Commonly known as: CELEBREX Take 1 capsule (200 mg total) by mouth 2 (two) times  daily.   diltiazem 240 MG 24 hr capsule Commonly known as: CARDIZEM CD Take 240 mg by mouth every evening.   fenofibrate 160 MG tablet Take 160 mg by mouth daily.   fluticasone 50 MCG/ACT nasal spray Commonly known as: FLONASE Place 1 spray into the nose daily as needed for allergies.   gabapentin 300 MG capsule Commonly known as: NEURONTIN Take 900 mg by mouth at bedtime.   glipiZIDE 5 MG tablet Commonly known as: GLUCOTROL Take 5 mg by mouth 2 (two) times daily.   HumuLIN 70/30 (70-30) 100 UNIT/ML injection Generic drug: insulin NPH-regular Human Inject 35-45 Units into the skin See admin instructions. Inject 45 units into the skin in the morning and 35 units in the evening   lovastatin 40 MG tablet Commonly known as: MEVACOR Take 40 mg by mouth daily with supper.   metFORMIN 1000 MG tablet Commonly known as: GLUCOPHAGE Take 1,000 mg by mouth 2 (two) times daily with a meal.   metoprolol succinate 100 MG 24 hr tablet Commonly known as: TOPROL-XL Take 100 mg by mouth at bedtime.   oxyCODONE 5 MG immediate release tablet Commonly known as: Oxy IR/ROXICODONE Take 1 tablet (5 mg total) by mouth every 4 (four) hours as needed for severe pain.   traMADol 50 MG tablet Commonly known as: ULTRAM Take 1-2 tablets (50-100 mg total) by mouth every 4 (four) hours as needed for moderate pain.   triamterene-hydrochlorothiazide 37.5-25 MG tablet Commonly known as: MAXZIDE-25 Take 1 tablet by mouth daily.   venlafaxine XR 37.5 MG 24 hr capsule Commonly known as: EFFEXOR-XR Take 37.5 mg by mouth daily with breakfast.   vitamin B-12 500 MCG tablet Commonly known as: CYANOCOBALAMIN Take 1,000 mcg by mouth daily.   vitamin C 1000 MG tablet Take 1,000 mg by mouth daily.               Durable Medical Equipment  (From admission, onward)           Start     Ordered   05/10/21 1818  DME Walker rolling  Once       Question:  Patient needs a walker to treat with the  following condition  Answer:  Total knee replacement status   05/10/21 1817   05/10/21 1818  DME Bedside commode  Once       Question:  Patient needs a bedside commode to treat with the following condition  Answer:  Total knee replacement status   05/10/21 1817           Diagnostic Studies: DG Knee 1-2 Views Left  Result Date: 05/10/2021 CLINICAL DATA:  Hardware replacement EXAM: LEFT KNEE - 1-2 VIEW COMPARISON:  04/16/2021 FINDINGS: Single low resolution intraoperative lateral view  of the knee. Total fluoroscopy time was 1 minutes 15 seconds, fluoroscopic dose of 5.28 mGy. The image demonstrates a previous knee replacement. There is replacement of patellar hardware. IMPRESSION: Intraoperative fluoroscopic assistance provided during knee surgery Electronically Signed   By: Jasmine PangKim  Fujinaga M.D.   On: 05/10/2021 16:43   DG Knee 1-2 Views Left  Result Date: 04/16/2021 CLINICAL DATA:  Patellar fracture. EXAM: LEFT KNEE - 1-2 VIEW COMPARISON:  Radiographs 11/19/2020. FINDINGS: C-arm fluoroscopy provided. Two spot fluoroscopic images are submitted, demonstrating K-wire and additional anterior wire fixation of the patella for a probable transverse fracture which appears nondisplaced. There is a small amount of air within the joint. Patient is status post previous total knee arthroplasty. IMPRESSION: Intraoperative views following patellar ORIF. No displaced fracture or other complication identified. Electronically Signed   By: Carey BullocksWilliam  Veazey M.D.   On: 04/16/2021 11:23   DG C-Arm 1-60 Min-No Report  Result Date: 05/10/2021 Fluoroscopy was utilized by the requesting physician.  No radiographic interpretation.   DG C-Arm 1-60 Min-No Report  Result Date: 05/10/2021 Fluoroscopy was utilized by the requesting physician.  No radiographic interpretation.   DG C-Arm 1-60 Min-No Report  Result Date: 05/10/2021 Fluoroscopy was utilized by the requesting physician.  No radiographic interpretation.   DG  C-Arm 1-60 Min-No Report  Result Date: 05/10/2021 Fluoroscopy was utilized by the requesting physician.  No radiographic interpretation.   DG C-Arm 1-60 Min-No Report  Result Date: 04/16/2021 Fluoroscopy was utilized by the requesting physician.  No radiographic interpretation.   DG C-Arm 1-60 Min-No Report  Result Date: 04/16/2021 Fluoroscopy was utilized by the requesting physician.  No radiographic interpretation.   DG C-Arm 1-60 Min-No Report  Result Date: 04/16/2021 Fluoroscopy was utilized by the requesting physician.  No radiographic interpretation.    Disposition: Plan for discharge home this afternoon pending progress with PT.   Follow-up Information     Madelyn FlavorsSmith, Benjamin B, PA-C Follow up on 05/16/2021.   Specialty: Orthopedic Surgery Why: at 8:45am Contact information: [address] [facility] [place] [address] [phone]         Donato HeinzHooten, Tryton Bodi P, MD Follow up on 06/20/2021.   Specialty: Orthopedic Surgery Why: at 3:00pm Contact information: [address] [facility] [place] [address] [phone]                Signed: Meriel PicaJames L Marjon Doxtater PA-C 05/11/2021, 8:10 AM

## 2021-05-11 NOTE — Anesthesia Postprocedure Evaluation (Signed)
Anesthesia Post Note  Patient: Kristopher Oneal  Procedure(s) Performed: OPEN REDUCTION INTERNAL (ORIF) FIXATION PATELLA (Left: Knee)  Patient location during evaluation: Nursing Unit Anesthesia Type: Spinal Level of consciousness: oriented and awake and alert Pain management: pain level controlled Vital Signs Assessment: post-procedure vital signs reviewed and stable Respiratory status: spontaneous breathing, respiratory function stable and patient connected to nasal cannula oxygen Cardiovascular status: blood pressure returned to baseline and stable Postop Assessment: no headache, no backache, no apparent nausea or vomiting and spinal receding Anesthetic complications: no   No notable events documented.   Last Vitals:  Vitals:   05/10/21 2304 05/11/21 0606  BP: 132/74 121/79  Pulse: 97 87  Resp: 16 16  Temp: 36.8 C (!) 36.2 C  SpO2: 95% 97%    Last Pain:  Vitals:   05/10/21 2044  TempSrc:   PainSc: 0-No pain                 Corinda Gubler

## 2021-05-11 NOTE — Progress Notes (Signed)
°  Subjective: 1 Day Post-Op Procedure(s) (LRB): OPEN REDUCTION INTERNAL (ORIF) FIXATION PATELLA (Left) Patient reports pain as mild.   Patient is well, and has had no acute complaints or problems Plan is to go Home after hospital stay. Negative for chest pain and shortness of breath Fever: no Gastrointestinal:Negative for nausea and vomiting Patient is passing gas without pain this morning.   Objective: Vital signs in last 24 hours: Temp:  [96.9 F (36.1 C)-98.4 F (36.9 C)] 97.1 F (36.2 C) (01/21 0606) Pulse Rate:  [79-98] 87 (01/21 0606) Resp:  [16-22] 16 (01/21 0606) BP: (109-151)/(63-90) 121/79 (01/21 0606) SpO2:  [91 %-97 %] 97 % (01/21 0606) Weight:  [120.2 kg] 120.2 kg (01/20 1023)  Intake/Output from previous day:  Intake/Output Summary (Last 24 hours) at 05/11/2021 0759 Last data filed at 05/10/2021 2208 Gross per 24 hour  Intake 1350 ml  Output 1750 ml  Net -400 ml    Intake/Output this shift: No intake/output data recorded.  Labs: No results for input(s): HGB in the last 72 hours. No results for input(s): WBC, RBC, HCT, PLT in the last 72 hours. No results for input(s): NA, K, CL, CO2, BUN, CREATININE, GLUCOSE, CALCIUM in the last 72 hours. No results for input(s): LABPT, INR in the last 72 hours.   EXAM General - Patient is Alert, Appropriate, and Oriented Extremity - ABD soft Sensation intact distally Dorsiflexion/Plantar flexion intact Bulky dressing intact to the left leg.  No drainage noted. Knee ROM brace locked in extension. Able to flex and extend toes and perform straight leg raise without difficulty.  Past Medical History:  Diagnosis Date   Anxiety    Arthritis    Diabetes mellitus without complication ([place])    type 2   Dysrhythmia    PVC/ a fib   Heart murmur    High cholesterol    Hypertension     Assessment/Plan: 1 Day Post-Op Procedure(s) (LRB): OPEN REDUCTION INTERNAL (ORIF) FIXATION PATELLA (Left) Principal Problem:   S/P  ORIF (open reduction internal fixation) fracture  Estimated body mass index is 34.02 kg/m as calculated from the following:   Height as of this encounter: 6\' 2"  (1.88 m).   Weight as of this encounter: 120.2 kg. Advance diet Up with therapy D/C IV fluids when tolerating po intake.  Vitals reviewed. Knee brace intact, no drainage noted. SLR without issue today. Up with therapy today, will need to clear stairs prior to discharge. Plan for discharge home this afternoon pending progress with PT. Follow-up next week for skin check.  DVT Prophylaxis - Aspirin Weight-Bearing as tolerated to left leg with brace locked in extension.  Raquel Ersel Enslin, PA-C [facility] 05/11/2021, 7:59 AM

## 2021-05-17 ENCOUNTER — Encounter: Payer: Self-pay | Admitting: Orthopedic Surgery

## 2021-11-20 ENCOUNTER — Other Ambulatory Visit (INDEPENDENT_AMBULATORY_CARE_PROVIDER_SITE_OTHER): Payer: Self-pay | Admitting: Nurse Practitioner

## 2021-11-20 DIAGNOSIS — I739 Peripheral vascular disease, unspecified: Secondary | ICD-10-CM

## 2021-11-21 ENCOUNTER — Encounter (INDEPENDENT_AMBULATORY_CARE_PROVIDER_SITE_OTHER): Payer: Self-pay | Admitting: Nurse Practitioner

## 2021-11-21 ENCOUNTER — Ambulatory Visit (INDEPENDENT_AMBULATORY_CARE_PROVIDER_SITE_OTHER): Payer: Medicare Other

## 2021-11-21 ENCOUNTER — Ambulatory Visit (INDEPENDENT_AMBULATORY_CARE_PROVIDER_SITE_OTHER): Payer: Medicare Other | Admitting: Nurse Practitioner

## 2021-11-21 VITALS — BP 126/72 | HR 94 | Resp 16 | Wt 279.0 lb

## 2021-11-21 DIAGNOSIS — E782 Mixed hyperlipidemia: Secondary | ICD-10-CM | POA: Diagnosis not present

## 2021-11-21 DIAGNOSIS — I739 Peripheral vascular disease, unspecified: Secondary | ICD-10-CM | POA: Diagnosis not present

## 2021-11-21 DIAGNOSIS — I10 Essential (primary) hypertension: Secondary | ICD-10-CM | POA: Diagnosis not present

## 2021-11-21 DIAGNOSIS — E1169 Type 2 diabetes mellitus with other specified complication: Secondary | ICD-10-CM | POA: Diagnosis not present

## 2021-12-02 ENCOUNTER — Other Ambulatory Visit: Payer: Self-pay

## 2021-12-02 ENCOUNTER — Emergency Department: Payer: Medicare Other

## 2021-12-02 ENCOUNTER — Inpatient Hospital Stay
Admission: EM | Admit: 2021-12-02 | Discharge: 2021-12-04 | DRG: 291 | Disposition: A | Discharge status: Home / Self Care | Payer: Medicare Other | Attending: Obstetrics and Gynecology | Admitting: Obstetrics and Gynecology

## 2021-12-02 ENCOUNTER — Inpatient Hospital Stay (HOSPITAL_COMMUNITY)
Admit: 2021-12-02 | Discharge: 2021-12-02 | Disposition: A | Discharge status: Home / Self Care | Payer: Medicare Other | Attending: Internal Medicine | Admitting: Internal Medicine

## 2021-12-02 DIAGNOSIS — I5031 Acute diastolic (congestive) heart failure: Secondary | ICD-10-CM | POA: Diagnosis present

## 2021-12-02 DIAGNOSIS — I10 Essential (primary) hypertension: Secondary | ICD-10-CM | POA: Diagnosis not present

## 2021-12-02 DIAGNOSIS — E876 Hypokalemia: Secondary | ICD-10-CM | POA: Diagnosis present

## 2021-12-02 DIAGNOSIS — I4891 Unspecified atrial fibrillation: Secondary | ICD-10-CM

## 2021-12-02 DIAGNOSIS — Z8249 Family history of ischemic heart disease and other diseases of the circulatory system: Secondary | ICD-10-CM | POA: Diagnosis not present

## 2021-12-02 DIAGNOSIS — T502X5A Adverse effect of carbonic-anhydrase inhibitors, benzothiadiazides and other diuretics, initial encounter: Secondary | ICD-10-CM | POA: Diagnosis present

## 2021-12-02 DIAGNOSIS — Z79899 Other long term (current) drug therapy: Secondary | ICD-10-CM

## 2021-12-02 DIAGNOSIS — Z87891 Personal history of nicotine dependence: Secondary | ICD-10-CM | POA: Diagnosis not present

## 2021-12-02 DIAGNOSIS — I110 Hypertensive heart disease with heart failure: Principal | ICD-10-CM | POA: Diagnosis present

## 2021-12-02 DIAGNOSIS — Z6835 Body mass index (BMI) 35.0-35.9, adult: Secondary | ICD-10-CM | POA: Diagnosis not present

## 2021-12-02 DIAGNOSIS — Z825 Family history of asthma and other chronic lower respiratory diseases: Secondary | ICD-10-CM

## 2021-12-02 DIAGNOSIS — E785 Hyperlipidemia, unspecified: Secondary | ICD-10-CM | POA: Diagnosis present

## 2021-12-02 DIAGNOSIS — I509 Heart failure, unspecified: Secondary | ICD-10-CM

## 2021-12-02 DIAGNOSIS — I482 Chronic atrial fibrillation, unspecified: Secondary | ICD-10-CM | POA: Diagnosis not present

## 2021-12-02 DIAGNOSIS — I4821 Permanent atrial fibrillation: Secondary | ICD-10-CM | POA: Diagnosis present

## 2021-12-02 DIAGNOSIS — Z5986 Financial insecurity: Secondary | ICD-10-CM

## 2021-12-02 DIAGNOSIS — R0602 Shortness of breath: Secondary | ICD-10-CM | POA: Diagnosis not present

## 2021-12-02 DIAGNOSIS — E782 Mixed hyperlipidemia: Secondary | ICD-10-CM | POA: Diagnosis not present

## 2021-12-02 DIAGNOSIS — J9601 Acute respiratory failure with hypoxia: Secondary | ICD-10-CM | POA: Diagnosis not present

## 2021-12-02 DIAGNOSIS — E7800 Pure hypercholesterolemia, unspecified: Secondary | ICD-10-CM | POA: Diagnosis present

## 2021-12-02 DIAGNOSIS — Z7982 Long term (current) use of aspirin: Secondary | ICD-10-CM

## 2021-12-02 DIAGNOSIS — E669 Obesity, unspecified: Secondary | ICD-10-CM | POA: Diagnosis present

## 2021-12-02 DIAGNOSIS — D72829 Elevated white blood cell count, unspecified: Secondary | ICD-10-CM | POA: Diagnosis present

## 2021-12-02 DIAGNOSIS — E1165 Type 2 diabetes mellitus with hyperglycemia: Secondary | ICD-10-CM | POA: Diagnosis present

## 2021-12-02 DIAGNOSIS — Z96653 Presence of artificial knee joint, bilateral: Secondary | ICD-10-CM | POA: Diagnosis present

## 2021-12-02 DIAGNOSIS — Z20822 Contact with and (suspected) exposure to covid-19: Secondary | ICD-10-CM | POA: Diagnosis present

## 2021-12-02 DIAGNOSIS — E119 Type 2 diabetes mellitus without complications: Secondary | ICD-10-CM | POA: Diagnosis not present

## 2021-12-02 DIAGNOSIS — Z7984 Long term (current) use of oral hypoglycemic drugs: Secondary | ICD-10-CM | POA: Diagnosis not present

## 2021-12-02 DIAGNOSIS — Z794 Long term (current) use of insulin: Secondary | ICD-10-CM

## 2021-12-02 LAB — CBC
HCT: 43.1 % (ref 39.0–52.0)
Hemoglobin: 14.2 g/dL (ref 13.0–17.0)
MCH: 31.8 pg (ref 26.0–34.0)
MCHC: 32.9 g/dL (ref 30.0–36.0)
MCV: 96.6 fL (ref 80.0–100.0)
Platelets: 226 10*3/uL (ref 150–400)
RBC: 4.46 MIL/uL (ref 4.22–5.81)
RDW: 13.3 % (ref 11.5–15.5)
WBC: 14.5 10*3/uL — ABNORMAL HIGH (ref 4.0–10.5)
nRBC: 0 % (ref 0.0–0.2)

## 2021-12-02 LAB — GLUCOSE, CAPILLARY
Glucose-Capillary: 331 mg/dL — ABNORMAL HIGH (ref 70–99)
Glucose-Capillary: 241 mg/dL — ABNORMAL HIGH (ref 70–99)

## 2021-12-02 LAB — ECHOCARDIOGRAM COMPLETE
AR max vel: 1.48 cm2
AV Area VTI: 1.37 cm2
AV Area mean vel: 1.37 cm2
AV Mean grad: 9 mmHg
AV Peak grad: 15.8 mmHg
Ao pk vel: 1.99 m/s
Area-P 1/2: 4.89 cm2
Height: 74 in
S' Lateral: 3.62 cm
Weight: 4444.47 oz

## 2021-12-02 LAB — BASIC METABOLIC PANEL
Anion gap: 11 (ref 5–15)
BUN: 19 mg/dL (ref 8–23)
CO2: 21 mmol/L — ABNORMAL LOW (ref 22–32)
Calcium: 8.8 mg/dL — ABNORMAL LOW (ref 8.9–10.3)
Chloride: 102 mmol/L (ref 98–111)
Creatinine, Ser: 1.1 mg/dL (ref 0.61–1.24)
GFR, Estimated: >60 mL/min (ref >60)
Glucose, Bld: 284 mg/dL — ABNORMAL HIGH (ref 70–99)
Potassium: 4.1 mmol/L (ref 3.5–5.1)
Sodium: 134 mmol/L — ABNORMAL LOW (ref 135–145)

## 2021-12-02 LAB — TROPONIN I (HIGH SENSITIVITY): Troponin I (High Sensitivity): 6 ng/L (ref <18)

## 2021-12-02 LAB — APTT: aPTT: 32 seconds (ref 24–36)

## 2021-12-02 LAB — MAGNESIUM: Magnesium: 1.6 mg/dL — ABNORMAL LOW (ref 1.7–2.4)

## 2021-12-02 LAB — BRAIN NATRIURETIC PEPTIDE: B Natriuretic Peptide: 241.3 pg/mL — ABNORMAL HIGH (ref 0.0–100.0)

## 2021-12-02 LAB — PROCALCITONIN: Procalcitonin: <0.1 ng/mL

## 2021-12-02 LAB — HEPARIN LEVEL (UNFRACTIONATED): Heparin Unfractionated: 0.15 IU/mL — ABNORMAL LOW (ref 0.30–0.70)

## 2021-12-02 LAB — SARS CORONAVIRUS 2 BY RT PCR: SARS Coronavirus 2 by RT PCR: NEGATIVE

## 2021-12-02 LAB — PROTIME-INR
INR: 1.1 (ref 0.8–1.2)
Prothrombin Time: 14.1 seconds (ref 11.4–15.2)

## 2021-12-02 MED ORDER — PERFLUTREN LIPID MICROSPHERE
1.0000 mL | INTRAVENOUS | Status: AC | PRN
  Administered 2021-12-02: 3 mL via INTRAVENOUS

## 2021-12-02 MED ORDER — ADULT MULTIVITAMIN W/MINERALS CH
1.0000 | ORAL_TABLET | Freq: Every day | ORAL | Status: DC
  Administered 2021-12-03 – 2021-12-04 (×2): 1 via ORAL
  Filled 2021-12-02 (×2): qty 1

## 2021-12-02 MED ORDER — MAGNESIUM SULFATE 2 GM/50ML IV SOLN
2.0000 g | Freq: Once | INTRAVENOUS | Status: AC
  Administered 2021-12-02: 2 g via INTRAVENOUS
  Filled 2021-12-02: qty 50

## 2021-12-02 MED ORDER — INSULIN ASPART 100 UNIT/ML IJ SOLN
0.0000 [IU] | Freq: Three times a day (TID) | INTRAMUSCULAR | Status: DC
  Administered 2021-12-02: 7 [IU] via SUBCUTANEOUS
  Administered 2021-12-03: 3 [IU] via SUBCUTANEOUS
  Filled 2021-12-02 (×2): qty 1

## 2021-12-02 MED ORDER — ACETAMINOPHEN 325 MG PO TABS: 650.0000 mg | ORAL_TABLET | Freq: Four times a day (QID) | ORAL | Status: DC | PRN

## 2021-12-02 MED ORDER — HYDRALAZINE HCL 20 MG/ML IJ SOLN
5.0000 mg | INTRAMUSCULAR | Status: DC | PRN
Start: 2021-12-02 — End: 2021-12-04

## 2021-12-02 MED ORDER — ONDANSETRON HCL 4 MG/2ML IJ SOLN: 4.0000 mg | Freq: Three times a day (TID) | INTRAMUSCULAR | Status: DC | PRN

## 2021-12-02 MED ORDER — METOPROLOL SUCCINATE ER 100 MG PO TB24
100.0000 mg | ORAL_TABLET | Freq: Every day | ORAL | Status: DC
  Administered 2021-12-02 – 2021-12-03 (×2): 100 mg via ORAL
  Filled 2021-12-02 (×2): qty 1

## 2021-12-02 MED ORDER — FUROSEMIDE 10 MG/ML IJ SOLN
40.0000 mg | Freq: Two times a day (BID) | INTRAMUSCULAR | Status: DC
  Administered 2021-12-02 – 2021-12-03 (×2): 40 mg via INTRAVENOUS
  Filled 2021-12-02 (×2): qty 4

## 2021-12-02 MED ORDER — INSULIN ASPART PROT & ASPART (70-30 MIX) 100 UNIT/ML ~~LOC~~ SUSP
18.0000 [IU] | Freq: Every day | SUBCUTANEOUS | Status: DC
  Administered 2021-12-02 – 2021-12-03 (×2): 18 [IU] via SUBCUTANEOUS
  Filled 2021-12-02 (×2): qty 10

## 2021-12-02 MED ORDER — HEPARIN BOLUS VIA INFUSION
5000.0000 [IU] | Freq: Once | INTRAVENOUS | Status: DC
  Filled 2021-12-02: qty 5000

## 2021-12-02 MED ORDER — ENOXAPARIN SODIUM 40 MG/0.4ML IJ SOSY
40.0000 mg | PREFILLED_SYRINGE | Freq: Every day | INTRAMUSCULAR | Status: DC
Start: 2021-12-02 — End: 2021-12-02
  Administered 2021-12-02: 40 mg via SUBCUTANEOUS
  Filled 2021-12-02: qty 0.4

## 2021-12-02 MED ORDER — INSULIN ASPART PROT & ASPART (70-30 MIX) 100 UNIT/ML ~~LOC~~ SUSP: 18.0000 [IU] | Freq: Two times a day (BID) | SUBCUTANEOUS | Status: DC

## 2021-12-02 MED ORDER — APIXABAN 5 MG PO TABS
5.0000 mg | ORAL_TABLET | Freq: Two times a day (BID) | ORAL | Status: DC
  Administered 2021-12-02 – 2021-12-04 (×4): 5 mg via ORAL
  Filled 2021-12-02 (×4): qty 1

## 2021-12-02 MED ORDER — ALBUTEROL SULFATE (2.5 MG/3ML) 0.083% IN NEBU: 2.5000 mg | INHALATION_SOLUTION | RESPIRATORY_TRACT | Status: DC | PRN

## 2021-12-02 MED ORDER — METOPROLOL TARTRATE 5 MG/5ML IV SOLN: 2.5000 mg | INTRAVENOUS | Status: DC | PRN

## 2021-12-02 MED ORDER — VENLAFAXINE HCL ER 37.5 MG PO CP24
37.5000 mg | ORAL_CAPSULE | Freq: Every day | ORAL | Status: DC
  Administered 2021-12-03 – 2021-12-04 (×2): 37.5 mg via ORAL
  Filled 2021-12-02 (×2): qty 1

## 2021-12-02 MED ORDER — FUROSEMIDE 10 MG/ML IJ SOLN
40.0000 mg | Freq: Once | INTRAMUSCULAR | Status: AC
  Administered 2021-12-02: 40 mg via INTRAVENOUS
  Filled 2021-12-02: qty 4

## 2021-12-02 MED ORDER — INSULIN ASPART PROT & ASPART (70-30 MIX) 100 UNIT/ML ~~LOC~~ SUSP
35.0000 [IU] | Freq: Every day | SUBCUTANEOUS | Status: DC
  Administered 2021-12-03 – 2021-12-04 (×2): 35 [IU] via SUBCUTANEOUS

## 2021-12-02 MED ORDER — DM-GUAIFENESIN ER 30-600 MG PO TB12: 1.0000 | ORAL_TABLET | Freq: Two times a day (BID) | ORAL | Status: DC | PRN

## 2021-12-02 MED ORDER — PRAVASTATIN SODIUM 40 MG PO TABS
40.0000 mg | ORAL_TABLET | Freq: Every day | ORAL | Status: DC
  Administered 2021-12-02 – 2021-12-03 (×2): 40 mg via ORAL
  Filled 2021-12-02 (×2): qty 1

## 2021-12-02 MED ORDER — FENOFIBRATE 160 MG PO TABS
160.0000 mg | ORAL_TABLET | Freq: Every day | ORAL | Status: DC
  Administered 2021-12-03 – 2021-12-04 (×2): 160 mg via ORAL
  Filled 2021-12-02 (×2): qty 1

## 2021-12-02 MED ORDER — INSULIN ASPART 100 UNIT/ML IJ SOLN
0.0000 [IU] | Freq: Every day | INTRAMUSCULAR | Status: DC
  Administered 2021-12-02: 2 [IU] via SUBCUTANEOUS
  Filled 2021-12-02: qty 1

## 2021-12-02 MED ORDER — DILTIAZEM HCL ER COATED BEADS 120 MG PO CP24
240.0000 mg | ORAL_CAPSULE | Freq: Every evening | ORAL | Status: DC
  Administered 2021-12-02 – 2021-12-03 (×2): 240 mg via ORAL
  Filled 2021-12-02: qty 2
  Filled 2021-12-02: qty 1

## 2021-12-02 MED ORDER — GABAPENTIN 300 MG PO CAPS
900.0000 mg | ORAL_CAPSULE | Freq: Every day | ORAL | Status: DC
  Administered 2021-12-02 – 2021-12-03 (×2): 900 mg via ORAL
  Filled 2021-12-02 (×2): qty 3

## 2021-12-02 MED ORDER — ASPIRIN 325 MG PO TBEC
325.0000 mg | DELAYED_RELEASE_TABLET | Freq: Every day | ORAL | Status: DC
  Administered 2021-12-03 – 2021-12-04 (×2): 325 mg via ORAL
  Filled 2021-12-02 (×2): qty 1

## 2021-12-02 MED ORDER — HEPARIN (PORCINE) 25000 UT/250ML-% IV SOLN
1500.0000 [IU]/h | INTRAVENOUS | Status: DC
  Filled 2021-12-02: qty 250

## 2021-12-02 NOTE — Assessment & Plan Note (Signed)
Recent A1c 8.0, poorly controlled.   Patient is on metformin and glipizide which are currently on hold Continue 70/30 insulin 35 units in morning and 18 units in evening Maintain consistent carbohydrate diet

## 2021-12-02 NOTE — H&P (Addendum)
History and Physical    Tag Wurtz OEV:[id number] DOB: February 14, 1951 DOA: 12/02/2021  Referring MD/NP/PA:   PCP: Danella Penton, MD   Patient coming from:  The patient is coming from home.  At baseline, pt is independent for most of ADL.        Chief Complaint: SOB and leg edema  HPI: Kristopher Oneal is a 71 y.o. male with medical history significant of HTN, HLD, DM, A fib not on anticoagulant, anxiety, obesity with a BMI 35.66, who presents with SOB and leg edema.  Patient states that he started having shortness of breath since this morning, which has been progressively worsening.  Patient has cough with little mucus production.  Denies chest pain, fever or chills.  Patient is not using oxygen normally.  Her oxygen saturation is 89% on room air, which improved to 95% on 2 L oxygen in the ED.  Patient does not have nausea, vomiting, diarrhea or abdominal pain.  No symptoms of UTI.   Data reviewed independently and ED Course: pt was found to have BNP 241.3, troponin level 6, WBC 14.5, negative COVID PCR, GFR> 60, temperature 99.4, blood pressure 153/79, heart rate 120s, RR 28, 24, oxygen saturations 89% on room air, which improved to 95% on 2 L oxygen.  Chest x-ray showed cardiomegaly and vascular congestion with asymmetric pulmonary edema.  Patient is admitted to PCU as inpatient.  Dr. Okey Dupre of cardiology is consulted.   EKG: I have personally reviewed.  Atrial fibrillation, QTc 458, left atrial enlargement, low voltage, nonspecific T wave change.   Review of Systems:   General: no fevers, chills, no body weight gain, fatigue HEENT: no blurry vision, hearing changes or sore throat Respiratory: has dyspnea, coughing, no wheezing CV: no chest pain, no palpitations GI: no nausea, vomiting, abdominal pain, diarrhea, constipation GU: no dysuria, burning on urination, increased urinary frequency, hematuria  Ext: has leg edema Neuro: no unilateral weakness, numbness, or tingling, no vision change or  hearing loss Skin: no rash, no skin tear. MSK: No muscle spasm, no deformity, no limitation of range of movement in spin Heme: No easy bruising.  Travel history: No recent long distant travel.   Allergy: No Known Allergies  Past Medical History:  Diagnosis Date   Anxiety    Arthritis    Diabetes mellitus without complication (HCC)    type 2   Dysrhythmia    PVC/ a fib   Heart murmur    High cholesterol    Hypertension     Past Surgical History:  Procedure Laterality Date   BACK SURGERY     l5/s1   COLONOSCOPY     KNEE ARTHROPLASTY Right 01/02/2020   Procedure: COMPUTER ASSISTED TOTAL KNEE ARTHROPLASTY;  Surgeon: Donato Heinz, MD;  Location: [facility] ORS;  Service: Orthopedics;  Laterality: Right;   KNEE ARTHROPLASTY Left 11/19/2020   Procedure: COMPUTER ASSISTED TOTAL KNEE ARTHROPLASTY;  Surgeon: Donato Heinz, MD;  Location: [facility] OR[facility];  Service: Orthopedics;  Laterality: Left;   ORIF PATELLA Left 04/16/2021   Procedure: OPEN REDUCTION INTERNAL (ORIF) FIXATION PATELLA;  Surgeon: Donato Heinz, MD;  Location: [facility] OR[facility];  Service: Orthopedics;  Laterality: Left;   ORIF PATELLA Left 05/10/2021   Procedure: OPEN REDUCTION INTERNAL (ORIF) FIXATION PATELLA;  Surgeon: Donato Heinz, MD;  Location: [facility] OR[facility];  Service: Orthopedics;  Laterality: Left;    Social History:  reports that he quit smoking about 43 years ago. His smoking use included cigarettes. He has never used smokeless  tobacco. He reports current alcohol use. He reports that he does not use drugs.  Family History:  Family History  Problem Relation Age of Onset   Dementia Mother    Heart attack Father    Emphysema Father    Heart attack Brother      Prior to Admission medications   Medication Sig Start Date End Date Taking? Authorizing Provider  Ascorbic Acid (VITAMIN C) 1000 MG tablet Take 1,000 mg by mouth daily.    [provider]  aspirin 325 MG EC tablet Take 1 tablet (325 mg total) by mouth daily  with breakfast. 05/11/21   Lattie Corns, PA-C  celecoxib (CELEBREX) 200 MG capsule Take 1 capsule (200 mg total) by mouth 2 (two) times daily. Patient not taking: Reported on 11/21/2021 05/11/21   Lattie Corns, PA-C  diltiazem (CARDIZEM CD) 240 MG 24 hr capsule Take 240 mg by mouth every evening.    [provider]  fenofibrate 160 MG tablet Take 160 mg by mouth daily.  05/27/18   [provider]  fluticasone (FLONASE) 50 MCG/ACT nasal spray Place 1 spray into the nose daily as needed for allergies.    [provider]  gabapentin (NEURONTIN) 300 MG capsule Take 900 mg by mouth at bedtime.  08/21/18   [provider]  glipiZIDE (GLUCOTROL) 5 MG tablet Take 5 mg by mouth 2 (two) times daily.    [provider]  insulin NPH-regular Human (HUMULIN 70/30) (70-30) 100 UNIT/ML injection Inject 25-50 Units into the skin See admin instructions. Inject 50 units into the skin in the morning and 25 units in the evening    [provider]  lovastatin (MEVACOR) 40 MG tablet Take 40 mg by mouth daily with supper.     [provider]  metFORMIN (GLUCOPHAGE) 1000 MG tablet Take 1,000 mg by mouth 2 (two) times daily with a meal.  05/27/18   [provider]  metoprolol succinate (TOPROL-XL) 100 MG 24 hr tablet Take 100 mg by mouth at bedtime. 09/26/18   [provider]  Multiple Vitamins-Minerals (ADVANCED DIABETIC MULTIVITAMIN) TABS Take 1 tablet by mouth daily.    [provider]  oxyCODONE (OXY IR/ROXICODONE) 5 MG immediate release tablet Take 1 tablet (5 mg total) by mouth every 4 (four) hours as needed for severe pain. Patient not taking: Reported on 11/21/2021 05/11/21   Lattie Corns, PA-C  traMADol (ULTRAM) 50 MG tablet Take 1-2 tablets (50-100 mg total) by mouth every 4 (four) hours as needed for moderate pain. Patient not taking: Reported on 11/21/2021 05/11/21   Lattie Corns, PA-C   triamterene-hydrochlorothiazide (MAXZIDE-25) 37.5-25 MG tablet Take 1 tablet by mouth daily.    [provider]  venlafaxine XR (EFFEXOR-XR) 37.5 MG 24 hr capsule Take 37.5 mg by mouth daily with breakfast.    [provider]  vitamin B-12 (CYANOCOBALAMIN) 500 MCG tablet Take 1,000 mcg by mouth daily.    [provider]    Physical Exam: Vitals:   12/02/21 1213 12/02/21 1408 12/02/21 1414 12/02/21 1626  BP:    (!) 158/80  Pulse: (!) 103 (!) 116 (!) 122 (!) 115  Resp: (!) 24 18 17 18   Temp:      TempSrc:      SpO2: 95% 96% 95% 97%  Weight:      Height:       General: Not in acute distress HEENT:       Eyes: PERRL, EOMI, no scleral  icterus.       ENT: No discharge from the ears and nose, no pharynx injection, no tonsillar enlargement.        Neck: Difficult to assess JVD due to obesity, no bruit, no mass felt. Heme: No neck lymph node enlargement. Cardiac: [id number], 1/6 systolic murmur, irregularly irregular rhythm, No gallops or rubs. Respiratory: No rales, wheezing, rhonchi or rubs. GI: Soft, nondistended, nontender, no rebound pain, no organomegaly, BS present. GU: No hematuria Ext: 1+ pitting leg edema bilaterally. 1+DP/PT pulse bilaterally. Musculoskeletal: No joint deformities, No joint redness or warmth, no limitation of ROM in spin. Skin: No rashes.  Neuro: Alert, oriented X3, cranial nerves II-XII grossly intact, moves all extremities normally.  Psych: Patient is not psychotic, no suicidal or hemocidal ideation.  Labs on Admission: I have personally reviewed following labs and imaging studies  CBC: Recent Labs  Lab 12/02/21 1058  WBC 14.5*  HGB 14.2  HCT 43.1  MCV 96.6  PLT [id number]   Basic Metabolic Panel: Recent Labs  Lab 12/02/21 1058 12/02/21 1349  NA 134*  --   K 4.1  --   CL 102  --   CO2 21*  --   GLUCOSE 284*  --   BUN 19  --   CREATININE 1.10  --   CALCIUM 8.8*  --   MG  --  1.6*   GFR: Estimated Creatinine Clearance:  84.3 mL/min (by C-G formula based on SCr of 1.1 mg/dL). Liver Function Tests: No results for input(s): "AST", "ALT", "ALKPHOS", "BILITOT", "PROT", "ALBUMIN" in the last 168 hours. No results for input(s): "LIPASE", "AMYLASE" in the last 168 hours. No results for input(s): "AMMONIA" in the last 168 hours. Coagulation Profile: Recent Labs  Lab 12/02/21 1642  INR 1.1   Cardiac Enzymes: No results for input(s): "CKTOTAL", "CKMB", "CKMBINDEX", "TROPONINI" in the last 168 hours. BNP (last 3 results) No results for input(s): "PROBNP" in the last 8760 hours. HbA1C: No results for input(s): "HGBA1C" in the last 72 hours. CBG: Recent Labs  Lab 12/02/21 1633  GLUCAP 331*   Lipid Profile: No results for input(s): "CHOL", "HDL", "LDLCALC", "TRIG", "CHOLHDL", "LDLDIRECT" in the last 72 hours. Thyroid Function Tests: No results for input(s): "TSH", "T4TOTAL", "FREET4", "T3FREE", "THYROIDAB" in the last 72 hours. Anemia Panel: No results for input(s): "VITAMINB12", "FOLATE", "FERRITIN", "TIBC", "IRON", "RETICCTPCT" in the last 72 hours. Urine analysis:    Component Value Date/Time   COLORURINE YELLOW (A) 11/08/2020 1202   APPEARANCEUR HAZY (A) 11/08/2020 1202   LABSPEC 1.017 11/08/2020 1202   PHURINE 6.0 11/08/2020 1202   GLUCOSEU NEGATIVE 11/08/2020 1202   HGBUR NEGATIVE 11/08/2020 1202   BILIRUBINUR NEGATIVE 11/08/2020 1202   KETONESUR NEGATIVE 11/08/2020 1202   PROTEINUR NEGATIVE 11/08/2020 1202   NITRITE NEGATIVE 11/08/2020 1202   LEUKOCYTESUR NEGATIVE 11/08/2020 1202   Sepsis Labs: @LABRCNTIP (procalcitonin:4,lacticidven:4) ) Recent Results (from the past 240 hour(s))  SARS Coronavirus 2 by RT PCR (hospital order, performed in [place] hospital lab) *cepheid single result test* Anterior Nasal Swab     Status: None   Collection Time: 12/02/21 11:49 AM   Specimen: Anterior Nasal Swab  Result Value Ref Range Status   SARS Coronavirus 2 by RT PCR NEGATIVE NEGATIVE Final     Comment: (NOTE) SARS-CoV-2 target nucleic acids are NOT DETECTED.  The SARS-CoV-2 RNA is generally detectable in upper and lower respiratory specimens during the acute phase of infection. The lowest concentration of SARS-CoV-2 viral copies this assay can detect is 250  copies / mL. A negative result does not preclude SARS-CoV-2 infection and should not be used as the sole basis for treatment or other patient management decisions.  A negative result may occur with improper specimen collection / handling, submission of specimen other than nasopharyngeal swab, presence of viral mutation(s) within the areas targeted by this assay, and inadequate number of viral copies (<250 copies / mL). A negative result must be combined with clinical observations, patient history, and epidemiological information.  Fact Sheet for Patients:   RoadLapTop.co.za  Fact Sheet for Healthcare Providers: http://kim-miller.com/  This test is not yet approved or  cleared by the [place] FDA and has been authorized for detection and/or diagnosis of SARS-CoV-2 by FDA under an Emergency Use Authorization (EUA).  This EUA will remain in effect (meaning this test can be used) for the duration of the COVID-19 declaration under Section 564(b)(1) of the Act, 21 U.S.C. section 360bbb-3(b)(1), unless the authorization is terminated or revoked sooner.  Performed at [facility], [address] [place], [place] [address]      Radiological Exams on Admission: ECHOCARDIOGRAM COMPLETE  Result Date: 12/02/2021    ECHOCARDIOGRAM REPORT   Patient Name:   [facility] Date of Exam: 12/02/2021 Medical Rec #:  [id number]   Height:       74.0 in Accession #:    [id number]  Weight:       277.8 lb Date of Birth:  [birth year]    BSA:          2.499 m Patient Age:    71 years    BP:           153/79 mmHg Patient Gender: M           HR:           124 bpm. Exam Location:  [facility]  Procedure: 2D Echo, Cardiac Doppler, Color Doppler and Intracardiac            Opacification Agent Indications:     I50.31 CHF Acute Diastolic  History:         Patient has no prior history of Echocardiogram examinations.                  CHF, Arrythmias:Atrial Fibrillation; Risk Factors:Diabetes,                  Hypertension, Dyslipidemia and Former Smoker.  Sonographer:     Ceasar Mons Referring Phys:  [id number] Lorretta Harp Diagnosing Phys: Julien Nordmann MD  Sonographer Comments: Technically difficult study due to poor echo windows. Image acquisition challenging due to patient body habitus. IMPRESSIONS  1. Left ventricular ejection fraction, by estimation, is 60 to 65%. The left ventricle has normal function. The left ventricle has no regional wall motion abnormalities. There is mild left ventricular hypertrophy. Left ventricular diastolic parameters are indeterminate.  2. Right ventricular systolic function is normal. The right ventricular size is normal.  3. The mitral valve is normal in structure. No evidence of mitral valve regurgitation. No evidence of mitral stenosis.  4. The aortic valve is normal in structure. Aortic valve regurgitation is not visualized. Aortic valve sclerosis/calcification is present, without any evidence of aortic stenosis.  5. The inferior vena cava is normal in size with greater than 50% respiratory variability, suggesting right atrial pressure of 3 mmHg.  6. Rhythm is atrial fibrillation  7. There is borderline dilatation of the aortic root, measuring 38 mm. FINDINGS  Left Ventricle: Left ventricular ejection fraction, by  estimation, is 60 to 65%. The left ventricle has normal function. The left ventricle has no regional wall motion abnormalities. Definity contrast agent was given IV to delineate the left ventricular  endocardial borders. The left ventricular internal cavity size was normal in size. There is mild left ventricular hypertrophy. Left ventricular diastolic parameters are  indeterminate. Right Ventricle: The right ventricular size is normal. No increase in right ventricular wall thickness. Right ventricular systolic function is normal. Left Atrium: Left atrial size was normal in size. Right Atrium: Right atrial size was normal in size. Pericardium: There is no evidence of pericardial effusion. Mitral Valve: The mitral valve is normal in structure. No evidence of mitral valve regurgitation. No evidence of mitral valve stenosis. Tricuspid Valve: The tricuspid valve is normal in structure. Tricuspid valve regurgitation is not demonstrated. No evidence of tricuspid stenosis. Aortic Valve: The aortic valve is normal in structure. Aortic valve regurgitation is not visualized. Aortic valve sclerosis/calcification is present, without any evidence of aortic stenosis. Aortic valve mean gradient measures 9.0 mmHg. Aortic valve peak  gradient measures 15.8 mmHg. Aortic valve area, by VTI measures 1.37 cm. Pulmonic Valve: The pulmonic valve was normal in structure. Pulmonic valve regurgitation is not visualized. No evidence of pulmonic stenosis. Aorta: The aortic root is normal in size and structure. There is borderline dilatation of the aortic root, measuring 38 mm. Venous: The inferior vena cava is normal in size with greater than 50% respiratory variability, suggesting right atrial pressure of 3 mmHg. IAS/Shunts: No atrial level shunt detected by color flow Doppler.  LEFT VENTRICLE PLAX 2D LVIDd:         5.08 cm LVIDs:         3.62 cm LV PW:         1.16 cm LV IVS:        1.17 cm LVOT diam:     1.80 cm LV SV:         41 LV SV Index:   16 LVOT Area:     2.54 cm  LEFT ATRIUM            Index LA diam:      4.50 cm  1.80 cm/m LA Vol (A4C): 104.0 ml 41.61 ml/m  AORTIC VALVE AV Area (Vmax):    1.48 cm AV Area (Vmean):   1.37 cm AV Area (VTI):     1.37 cm AV Vmax:           199.00 cm/s AV Vmean:          139.000 cm/s AV VTI:            0.300 m AV Peak Grad:      15.8 mmHg AV Mean Grad:      9.0  mmHg LVOT Vmax:         116.00 cm/s LVOT Vmean:        75.100 cm/s LVOT VTI:          0.162 m LVOT/AV VTI ratio: 0.54  AORTA Ao Root diam: 3.80 cm MITRAL VALVE MV Area (PHT): 4.89 cm     SHUNTS MV Decel Time: 155 msec     Systemic VTI:  0.16 m MV E velocity: 139.00 cm/s  Systemic Diam: 1.80 cm Ida Rogue MD Electronically signed by Ida Rogue MD Signature Date/Time: 12/02/2021/5:44:29 PM    Final    DG Chest 2 View  Result Date: 12/02/2021 CLINICAL DATA:  Shortness of breath EXAM: CHEST - 2 VIEW COMPARISON:  None Available.  FINDINGS: Transverse diameter of heart is increased.Central pulmonary vessels are prominent. Increased interstitial markings are seen in right lung and left lower lung field. There is blunting of lateral CP angles. There is no pneumothorax. IMPRESSION: Cardiomegaly. Central pulmonary vessels are prominent suggesting CHF. Increased interstitial markings are seen in both lungs, more so on the right side, possibly suggesting asymmetric pulmonary edema. Small bilateral pleural effusions. Electronically Signed   By: Elmer Picker M.D.   On: 12/02/2021 12:38      Assessment/Plan Principal Problem:   Acute CHF (HCC) Active Problems:   Chronic atrial fibrillation with RVR (HCC)   HTN (hypertension)   HLD (hyperlipidemia)   Diabetes mellitus without complication (HCC)   Leukocytosis   Obesity with body mass index (BMI) of 30.0 to 39.9   Hypomagnesemia   Assessment and Plan: * Acute CHF [facility]) Patient has shortness breath, 1+ bilateral leg edema, elevated BNP 241, vascular congestion on chest x-ray, clinically consistent with acute CHF.  Not sure which type of CHF, but patient had 2D echo on 08/03/2018 which showed EF of 50% indicating possible acute diastolic CHF.  Will get a 2D echo for further evaluation.  Patient has 2 L new oxygen requirement, but does not have acute respiratory distress, does not meet criteria for acute respiratory failure. Consulted Dr. Saunders Revel of  cardiology.  -Will admit to progressive unit as inpatient -Lasix 40 mg bid by IV -2d echo -Daily weights -strict I/O's -Low salt diet -Fluid restriction -Obtain REDs Vest reading  Chronic atrial fibrillation with RVR ([place]) Heart rate in the 120s.  CHADS2 score is 4.  Will need long-term anticoagulant.  I discussed with patient about starting blood thinner, such as Eliquis and explained the benefit and risk.  The patient would like to discuss with cardiologist before making his final decision.  -Continue home Cardizem 240 mg daily -Continue home metoprolol 100 mg daily -As needed metoprolol 2.5 mg every 2 hour for heart rate > 125. -If heart rate is not controlled, will start Cardizem drip -Defer anticoagulant use to cardiologist  HTN (hypertension) -IV hydralazine as needed -Patient is on IV Lasix -Hold Maxzide since patient is on IV Lasix -Cardizem, metoprolol  HLD (hyperlipidemia) - Fenofibrate and pravastatin  Diabetes mellitus without complication (HCC) Recent A1c 8.0, poorly controlled.  Patient is taking metformin, glipizide, 70/30 insulin 50 units in morning and 25 units in evening -Sliding scale insulin -70/30 insulin 35 units in morning and 18 units in evening  Hypomagnesemia Mg 1.6 -repleted.  Obesity with body mass index (BMI) of 30.0 to 39.9 BMI= 35.66   and BW= 126 -Diet and exercise.   -Encouraged to lose weight.   Leukocytosis WBC 14.5, no source of infection identified.  Patient denies fever or chills.  May be reactive. -Check urinalysis -Monitor body temperature closely -Will get procalcitonin level          DVT ppx:  SQ Lovenox --> change to IV heparin  Code Status: Full code  Family Communication:  Yes, patient's wife   at bed side.     Disposition Plan:  Anticipate discharge back to previous environment  Consults called:  Dr. Saunders Revel of cardiology is consulted.  Admission status and Level of care: Progressive:    as inpt       Severity of Illness:  The appropriate patient status for this patient is INPATIENT. Inpatient status is judged to be reasonable and necessary in order to provide the required intensity of service to ensure the patient's safety.  The patient's presenting symptoms, physical exam findings, and initial radiographic and laboratory data in the context of their chronic comorbidities is felt to place them at high risk for further clinical deterioration. Furthermore, it is not anticipated that the patient will be medically stable for discharge from the hospital within 2 midnights of admission.   * I certify that at the point of admission it is my clinical judgment that the patient will require inpatient hospital care spanning beyond 2 midnights from the point of admission due to high intensity of service, high risk for further deterioration and high frequency of surveillance required.*       Date of Service 12/02/2021    Ivor Costa [facility]   If 7PM-7AM, please contact night-coverage [email or web address] 12/02/2021, 7:11 PM

## 2021-12-02 NOTE — Assessment & Plan Note (Addendum)
Patient presents for evaluation of shortness breath, 1+ bilateral leg edema, elevated BNP 241, vascular congestion on chest x-ray, clinically consistent with acute CHF.   2D echocardiogram shows an LVEF of 60 to 65% Continue diuretic therapy Continue metoprolol Patient has 2 L new oxygen requirement due to acute CHF and will be weaned off as tolerated Appreciate cardiology input

## 2021-12-02 NOTE — Assessment & Plan Note (Addendum)
WBC 14.5, no source of infection identified.  Patient denies fever or chills.  May be reactive. -Check urinalysis -Monitor body temperature closely -Will get procalcitonin level

## 2021-12-02 NOTE — ED Provider Notes (Addendum)
[facility] Provider Note    Event Date/Time   First MD Initiated Contact with Patient 12/02/21 1109     (approximate)   History   Chief Complaint: Shortness of Breath   HPI  Kristopher Oneal is a 71 y.o. male with a past history of hypertension, diabetes, atrial fibrillation who comes the ED complaining of shortness of breath since waking up this morning, with dyspnea on exertion limiting him to only a few steps at a time.  Does not use home oxygen but was found to be 89% on room air in the ED.  Denies chest pain.  Feels better at rest sitting upright.     Physical Exam   Triage Vital Signs: ED Triage Vitals  Enc Vitals Group     BP 12/02/21 1048 (!) 160/75     Pulse Rate 12/02/21 1048 (!) 128     Resp 12/02/21 1048 18     Temp 12/02/21 1048 99.4 F (37.4 C)     Temp Source 12/02/21 1048 Oral     SpO2 12/02/21 1048 (!) 89 %     Weight 12/02/21 1051 277 lb 12.5 oz (126 kg)     Height 12/02/21 1051 6\' 2"  (1.88 m)     Head Circumference --      Peak Flow --      Pain Score 12/02/21 1051 0     Pain Loc --      Pain Edu? --      Excl. in GC? --     Most recent vital signs: Vitals:   12/02/21 1200 12/02/21 1213  BP: (!) 153/79   Pulse: (!) 124 (!) 103  Resp: (!) 28 (!) 24  Temp:    SpO2: 91% 95%    General: Awake, no distress.  CV:  Good peripheral perfusion.  Irregularly irregular rhythm, heart rate 110.  Normal distal pulses Resp:  Normal effort.  Bilateral basilar crackles, worse on the right Abd:  No distention.  Soft nontender Other:  1+ pitting edema bilateral lower legs.  Symmetric calf circumference.  No calf tenderness, negative Homans' sign   ED Results / Procedures / Treatments   Labs (all labs ordered are listed, but only abnormal results are displayed) Labs Reviewed  BASIC METABOLIC PANEL - Abnormal; Notable for the following components:      Result Value   Sodium 134 (*)    CO2 21 (*)    Glucose, Bld 284 (*)    Calcium  8.8 (*)    All other components within normal limits  CBC - Abnormal; Notable for the following components:   WBC 14.5 (*)    All other components within normal limits  BRAIN NATRIURETIC PEPTIDE - Abnormal; Notable for the following components:   B Natriuretic Peptide 241.3 (*)    All other components within normal limits  SARS CORONAVIRUS 2 BY RT PCR  TROPONIN I (HIGH SENSITIVITY)  TROPONIN I (HIGH SENSITIVITY)     EKG Interpreted by me Atrial fibrillation, rate of 113.  Normal axis.  Normal QRS ST segments and T waves.  No ischemic changes.  Prior EKG from 1 year ago shows the atrial fibrillation was present at that time.   RADIOLOGY X-ray interpreted by me, shows pulmonary edema, right greater than left   PROCEDURES:  Procedures   MEDICATIONS ORDERED IN ED: Medications  furosemide (LASIX) injection 40 mg (has no administration in time range)     IMPRESSION / MDM / ASSESSMENT AND PLAN /  ED COURSE  I reviewed the triage vital signs and the nursing notes.                              Differential diagnosis includes, but is not limited to, CHF, pneumonia, non-STEMI, electrolyte abnormality, renal failure, anemia  Patient's presentation is most consistent with acute presentation with potential threat to life or bodily function.  Patient presents with hypoxia and dyspnea on exertion.  Clinical exam and chest x-ray consistent with congestive heart failure with pulmonary edema.  Troponin is negative, EKG nonischemic.  Doubt dissection, pericardial effusion, or PE.  We will give IV Lasix and discuss with hospitalist for further management.       FINAL CLINICAL IMPRESSION(S) / ED DIAGNOSES   Final diagnoses:  Acute respiratory failure with hypoxia (HCC)     Rx / DC Orders   ED Discharge Orders     None        Note:  This document was prepared using Dragon voice recognition software and may include unintentional dictation errors.   Sharman Cheek,  MD 12/02/21 1302    Sharman Cheek, MD 12/02/21 [phone]

## 2021-12-02 NOTE — Assessment & Plan Note (Signed)
BMI= 35.66   and BW= 126 -Diet and exercise.   -Encouraged to lose weight.

## 2021-12-02 NOTE — Assessment & Plan Note (Signed)
Supplemented 

## 2021-12-02 NOTE — ED Triage Notes (Signed)
Pt to ED via POV from home. Pt reports SOB since 7am this morning. Pt denies CP, visual changes, N/V/D.   Pt HR 120s and oxygen 89% on RA. Pt placed on 2 L [place]. Pt denies at home oxygen use. Pt reports hx of irregular rhythm but Dr. [name] has not diagnosed him with afib yet. Pt EKG reading afib RVR

## 2021-12-02 NOTE — Assessment & Plan Note (Addendum)
Improved BP control Continue Cardizem and metoprolol

## 2021-12-02 NOTE — Assessment & Plan Note (Addendum)
Heart rate in the 120s.  CHADS2 score is 4.  Patient started on Eliquis 5 twice daily Continue home Cardizem 240 mg daily Continue home metoprolol 100 mg daily 2D echocardiogram shows an LVEF of 60 to 65% with no evidence of valvular pathology

## 2021-12-02 NOTE — Consult Note (Signed)
Cardiology Consultation:   Patient ID: Kristopher Oneal MRN: [id number]; DOB: 1950-11-12  Admit date: 12/02/2021 Date of Consult: 12/02/2021  PCP:  Danella Penton, MD   [facility] Providers Cardiologist:  Julien Nordmann, MD        Patient Profile:   Kristopher Oneal is a 71 y.o. male with a hx of HTN, HLD, DM2, anxiety, and chronic atrial fibrillation who is being seen 12/02/2021 for the evaluation of acute CHF at the request of Dr. Brien Few.  History of Present Illness:   Kristopher Oneal is a 71 y.o. male with a past medical history of HTN, HLD, DM2, anxiety, and chronic atrial fibrillation. Patient was diagnosed with asymptomatic atrial fibrillation in March of 2020.  He was started on Toprol-XL, Cardizem and Eliquis.  Unclear documentation on why Eliquis was stopped around August 2020.    He presents today to the [facility] emergency department with shortness of breath and lower extremity edema. States he lives in an RV and was unable to walk around without sitting down to catch his breath.After a short period of time he was able to recover but had progressive shortness of breath.  He has a pulse oximeter so he put that on and noticed his heart rate was elevated so he called his wife at work to come get him. He denies any chest pain, chest pressure, or palpitations. Previously noted he was in atrial fibrillation but stated Dr [name] had never talked about placing him on any anticoagulation medication. EKG confirmed he is in A-fib with RVR.  Labs are remarkable for BNP 241 and WBC 14.5.  High sensitivity troponins negative at 6. He was sating at 89% on Room air, improved to 95% with 2 L in the ED.  His BP was elevated to 160/75 on admission, now down to 153/79.  He was started on IV Lasix 40 mg BID. He states he has never taken a blood thinner because no one has brought it up.  He does take Cardizem and Metoprolol at home.   Past Medical History:  Diagnosis Date   Anxiety    Arthritis    Diabetes mellitus  without complication (HCC)    type 2   Dysrhythmia    PVC/ a fib   Heart murmur    High cholesterol    Hypertension     Past Surgical History:  Procedure Laterality Date   BACK SURGERY     l5/s1   COLONOSCOPY     KNEE ARTHROPLASTY Right 01/02/2020   Procedure: COMPUTER ASSISTED TOTAL KNEE ARTHROPLASTY;  Surgeon: Donato Heinz, MD;  Location: [facility] ORS;  Service: Orthopedics;  Laterality: Right;   KNEE ARTHROPLASTY Left 11/19/2020   Procedure: COMPUTER ASSISTED TOTAL KNEE ARTHROPLASTY;  Surgeon: Donato Heinz, MD;  Location: [facility] ORS;  Service: Orthopedics;  Laterality: Left;   ORIF PATELLA Left 04/16/2021   Procedure: OPEN REDUCTION INTERNAL (ORIF) FIXATION PATELLA;  Surgeon: Donato Heinz, MD;  Location: [facility] ORS;  Service: Orthopedics;  Laterality: Left;   ORIF PATELLA Left 05/10/2021   Procedure: OPEN REDUCTION INTERNAL (ORIF) FIXATION PATELLA;  Surgeon: Donato Heinz, MD;  Location: [facility] ORS;  Service: Orthopedics;  Laterality: Left;     Home Medications:  Prior to Admission medications   Medication Sig Start Date End Date Taking? Authorizing Provider  aspirin 325 MG EC tablet Take 1 tablet (325 mg total) by mouth daily with breakfast. 05/11/21  Yes Anson Oregon, PA-C  diltiazem (CARDIZEM CD) 240 MG 24 hr capsule Take  240 mg by mouth every evening.   Yes [provider]  fenofibrate 160 MG tablet Take 160 mg by mouth daily.  05/27/18  Yes [provider]  fluticasone (FLONASE) 50 MCG/ACT nasal spray Place 1 spray into the nose daily as needed for allergies.   Yes [provider]  gabapentin (NEURONTIN) 300 MG capsule Take 900 mg by mouth at bedtime.  08/21/18  Yes [provider]  glipiZIDE (GLUCOTROL) 5 MG tablet Take 5 mg by mouth 2 (two) times daily.   Yes [provider]  insulin NPH-regular Human (HUMULIN 70/30) (70-30) 100 UNIT/ML injection Inject 25-50 Units into the skin See admin instructions. Inject 50 units into the  skin in the morning and 25 units in the evening   Yes [provider]  lovastatin (MEVACOR) 40 MG tablet Take 40 mg by mouth daily with supper.    Yes [provider]  metFORMIN (GLUCOPHAGE) 1000 MG tablet Take 1,000 mg by mouth 2 (two) times daily with a meal.  05/27/18  Yes [provider]  metoprolol succinate (TOPROL-XL) 100 MG 24 hr tablet Take 100 mg by mouth at bedtime. 09/26/18  Yes [provider]  Multiple Vitamins-Minerals (ADVANCED DIABETIC MULTIVITAMIN) TABS Take 1 tablet by mouth daily.   Yes [provider]  triamterene-hydrochlorothiazide (MAXZIDE-25) 37.5-25 MG tablet Take 1 tablet by mouth daily.   Yes [provider]  venlafaxine XR (EFFEXOR-XR) 37.5 MG 24 hr capsule Take 37.5 mg by mouth daily with breakfast.   Yes [provider]  Ascorbic Acid (VITAMIN C) 1000 MG tablet Take 1,000 mg by mouth daily. Patient not taking: Reported on 12/02/2021    [provider]  celecoxib (CELEBREX) 200 MG capsule Take 1 capsule (200 mg total) by mouth 2 (two) times daily. Patient not taking: Reported on 11/21/2021 05/11/21   Anson Oregon, PA-C  oxyCODONE (OXY IR/ROXICODONE) 5 MG immediate release tablet Take 1 tablet (5 mg total) by mouth every 4 (four) hours as needed for severe pain. Patient not taking: Reported on 11/21/2021 05/11/21   Anson Oregon, PA-C  traMADol (ULTRAM) 50 MG tablet Take 1-2 tablets (50-100 mg total) by mouth every 4 (four) hours as needed for moderate pain. Patient not taking: Reported on 11/21/2021 05/11/21   Anson Oregon, PA-C  vitamin B-12 (CYANOCOBALAMIN) 500 MCG tablet Take 1,000 mcg by mouth daily. Patient not taking: Reported on 12/02/2021    [provider]    Inpatient Medications: Scheduled Meds:  [START ON 12/03/2021] aspirin EC  325 mg Oral Q breakfast   diltiazem  240 mg Oral QPM   enoxaparin (LOVENOX) injection  40 mg Subcutaneous Daily   [START ON 12/03/2021]  fenofibrate  160 mg Oral Daily   furosemide  40 mg Intravenous BID   gabapentin  900 mg Oral QHS   insulin aspart  0-5 Units Subcutaneous QHS   insulin aspart  0-9 Units Subcutaneous TID WC   insulin aspart protamine- aspart  18 Units Subcutaneous Q supper   [START ON 12/03/2021] insulin aspart protamine- aspart  35 Units Subcutaneous Q breakfast   metoprolol succinate  100 mg Oral QHS   multivitamin with minerals  1 tablet Oral Daily   pravastatin  40 mg Oral q1800   [START ON 12/03/2021] venlafaxine XR  37.5 mg Oral Q breakfast   Continuous Infusions:  PRN Meds: acetaminophen, albuterol, dextromethorphan-guaiFENesin, hydrALAZINE, metoprolol tartrate, ondansetron (ZOFRAN) IV  Allergies:   No Known Allergies  Social History:  Social History   Socioeconomic History   Marital status: Married    Spouse name: Not on file   Number of children: Not on file   Years of education: Not on file   Highest education level: Not on file  Occupational History   Not on file  Tobacco Use   Smoking status: Former    Types: Cigarettes    Quit date: 1980    Years since quitting: 43.6   Smokeless tobacco: Never   Tobacco comments:    1980  Vaping Use   Vaping Use: Never used  Substance and Sexual Activity   Alcohol use: Yes    Comment: rarely   Drug use: Never   Sexual activity: Not on file  Other Topics Concern   Not on file  Social History Narrative   Not on file   Social Determinants of Health   Financial Resource Strain: Not on file  Food Insecurity: Not on file  Transportation Needs: Not on file  Physical Activity: Not on file  Stress: Not on file  Social Connections: Not on file  Intimate Partner Violence: Not on file    Family History:    Family History  Problem Relation Age of Onset   Dementia Mother    Heart attack Father    Emphysema Father    Heart attack Brother      ROS:  Please see the history of present illness.  Review of Systems  Constitutional:  Negative.   HENT: Negative.    Eyes: Negative.   Respiratory:  Positive for shortness of breath.   Cardiovascular:  Positive for palpitations.  Gastrointestinal: Negative.   Genitourinary: Negative.   Musculoskeletal: Negative.   Skin: Negative.   Neurological: Negative.   Endo/Heme/Allergies: Negative.   Psychiatric/Behavioral: Negative.      All other ROS reviewed and negative.     Physical Exam/Data:   Vitals:   12/02/21 1200 12/02/21 1213 12/02/21 1408 12/02/21 1414  BP: (!) 153/79     Pulse: (!) 124 (!) 103 (!) 116 (!) 122  Resp: (!) 28 (!) 24 18 17   Temp:      TempSrc:      SpO2: 91% 95% 96% 95%  Weight:      Height:       No intake or output data in the 24 hours ending 12/02/21 1640    12/02/2021   10:51 AM 11/21/2021    4:09 PM 05/10/2021   10:23 AM  Last 3 Weights  Weight (lbs) 277 lb 12.5 oz 279 lb 265 lb  Weight (kg) 126 kg 126.554 kg 120.203 kg     Body mass index is 35.66 kg/m.  General:  Male in no acute distress HEENT: normal Neck: JVD difficult to assess due to body habitus Vascular: No carotid bruits; Distal pulses 2+ bilaterally Cardiac:  IRIR, no murmurs Lungs:  clear to auscultation bilaterally, no wheezing, rhonchi or rales, on 2L [place] Abd: obese, soft, nontender, no hepatomegaly  Ext: no edema Musculoskeletal:  No deformities, BUE and BLE strength normal and equal Skin: warm and dry  Neuro:  CNs 2-12 intact, no focal abnormalities noted Psych:  Normal affect   EKG:  The EKG was personally reviewed and demonstrates:  Atrial Fibrillation, rate of 113 Telemetry:  Telemetry was personally reviewed and demonstrates:  A-fib, rates in 110s  Relevant CV Studies:  Echo 08/03/2018: AORTIC ROOT                   Size:  Normal             Dissection: INDETERM FOR DISSECTION  AORTIC VALVE               Leaflets: Tricuspid                   Morphology: MODERATELY THICKENED               Mobility: Fully mobile  LEFT VENTRICLE                   Size:  Normal                        Anterior: Normal            Contraction: Normal                         Lateral: Normal             Closest EF: 50% (Estimated)                 Septal: Normal              LV Masses: No Masses                       Apical: Normal                    LVH: None                          Inferior: Normal                                                      Posterior: Normal           Dias.FxClass: can't be determined  MITRAL VALVE               Leaflets: Normal                        Mobility: Fully mobile             Morphology: THICKENED LEAFLET(S)  LEFT ATRIUM                   Size: MILDLY ENLARGED              LA Masses: No masses              IA Septum: Normal IAS  MAIN PA                   Size: Normal  PULMONIC VALVE             Morphology: Normal                        Mobility: Fully mobile  RIGHT VENTRICLE              RV Masses: No Masses                         Size: Normal              Free Wall: Normal  Contraction: Normal  TRICUSPID VALVE               Leaflets: Normal                        Mobility: Fully mobile             Morphology: Normal  RIGHT ATRIUM                   Size: Normal                        RA Other: None                RA Mass: No masses  PERICARDIUM                  Fluid: No effusion  INFERIOR VENACAVA                   Size: Normal Normal respiratory collapse  NORMAL LEFT VENTRICULAR SYSTOLIC FUNCTION  NORMAL RIGHT VENTRICULAR SYSTOLIC FUNCTION  MILD VALVULAR REGURGITATION (See above)  NO VALVULAR STENOSIS  Aortic sclerosis without stenotic gradient   Laboratory Data:  High Sensitivity Troponin:   Recent Labs  Lab 12/02/21 1058  TROPONINIHS 6     Chemistry Recent Labs  Lab 12/02/21 1058 12/02/21 1349  NA 134*  --   K 4.1  --   CL 102  --   CO2 21*  --   GLUCOSE 284*  --   BUN 19  --   CREATININE 1.10  --   CALCIUM 8.8*  --   MG  --  1.6*  GFRNONAA >60  --   ANIONGAP 11  --      No results for input(s): "PROT", "ALBUMIN", "AST", "ALT", "ALKPHOS", "BILITOT" in the last 168 hours. Lipids No results for input(s): "CHOL", "TRIG", "HDL", "LABVLDL", "LDLCALC", "CHOLHDL" in the last 168 hours.  Hematology Recent Labs  Lab 12/02/21 1058  WBC 14.5*  RBC 4.46  HGB 14.2  HCT 43.1  MCV 96.6  MCH 31.8  MCHC 32.9  RDW 13.3  PLT 226   Thyroid No results for input(s): "TSH", "FREET4" in the last 168 hours.  BNP Recent Labs  Lab 12/02/21 1058  BNP 241.3*    DDimer No results for input(s): "DDIMER" in the last 168 hours.   Radiology/Studies:  DG Chest 2 View  Result Date: 12/02/2021 CLINICAL DATA:  Shortness of breath EXAM: CHEST - 2 VIEW COMPARISON:  None Available. FINDINGS: Transverse diameter of heart is increased.Central pulmonary vessels are prominent. Increased interstitial markings are seen in right lung and left lower lung field. There is blunting of lateral CP angles. There is no pneumothorax. IMPRESSION: Cardiomegaly. Central pulmonary vessels are prominent suggesting CHF. Increased interstitial markings are seen in both lungs, more so on the right side, possibly suggesting asymmetric pulmonary edema. Small bilateral pleural effusions. Electronically Signed   By: Ernie Avena M.D.   On: 12/02/2021 12:38     Assessment and Plan:   Permanent Atrial Fibrillation - Diagnosed in 2020 - Asymptomatic - Continue Cardizem 240 mg daily - Continue Toprol-XL 100 mg daily - Start Heparin infusion - Consider Diltiazem infusion if rates do not improve on PO meds - Magnesium 1.6, replace with goal of 2  Shortness of breath/ Acute CHF - Currently on 2 L O2 - Echo pending - BNP 241.3 - Continue IV lasix 40 mg BID -  Strict I&Os - Daily weights - Heart healthy diet  Hypertension - BP currently - Continue home Cardizem and Metoprolol - Continue prn Hydralazine  Hyperlipidemia - LDL 71 on 09/2021 - Continue home Pravastatin and  Fenofibrate  Diabetes Mellitus Type 2 - A1C 8.7 on 09/2021 -Management per IM   Risk Assessment/Risk Scores:        New York Heart Association (NYHA) Functional Class NYHA Class II  CHA2DS2-VASc Score = 4   This indicates a 4.8% annual risk of stroke. The patient's score is based upon: CHF History: 1 HTN History: 1 Diabetes History: 1 Stroke History: 0 Vascular Disease History: 0 Age Score: 1 Gender Score: 0         For questions or updates, please contact [facility] Please consult [email or web address] for contact info under    Signed, Lemond Griffee, NP  12/02/2021 4:40 PM

## 2021-12-02 NOTE — Consult Note (Signed)
ANTICOAGULATION CONSULT NOTE - Consult  Pharmacy Consult for Heparin gtt  Indication: atrial fibrillation  No Known Allergies  Patient Measurements: Height: 6\' 2"  (188 cm) Weight: 126 kg (277 lb 12.5 oz) IBW/kg (Calculated) : 82.2 Heparin Dosing Weight: 109.7kg  Vital Signs: Temp: 99.4 F (37.4 C) (08/14 1048) Temp Source: Oral (08/14 1048) BP: 153/79 (08/14 1200) Pulse Rate: 122 (08/14 1414)  Labs: Recent Labs    12/02/21 1058  HGB 14.2  HCT 43.1  PLT 226  CREATININE 1.10  TROPONINIHS 6    Estimated Creatinine Clearance: 84.3 mL/min (by C-G formula based on SCr of 1.1 mg/dL).   Medications: NKDA Heparin Dosing Weight: 109.7kg PTA: h/o Eliquis but appears to have stopped in 11/2018 (will confirm with baseline labs) Inpatient: received LMWH 40mg  SQ x1 (8/14) >> Heparin gtt.  Assessment: 71yo male with h/o HTN, HLD, DM2, anxiety, and chronic atrial fibrillation. Patient was diagnosed with asymptomatic atrial fibrillation in March of 2020; started on Toprol-XL, Cardizem and Eliquis; then unclear documentation on why Eliquis was stopped around August 2020.  Pharmacy consulted for initiation and mgmt of heparin gtt.  Date Time aPTT/HL Rate/Comment       Baseline Labs: aPTT - pending INR - pending Hgb - 14.2 Plts - 226  Goal of Therapy:  Heparin level 0.3-0.7 units/ml aPTT 66-102s seconds Monitor platelets by anticoagulation protocol: Yes   Plan:  Give 5000 units bolus x1; then start heparin infusion at 1500 units/hr Check aPTT/Anti-Xa level baseline and then HL in 8 hours after infusion start.  CTM HL daily once consecutively therapeutic.  Titrate by aPTT's until lab correlation is confirmed, then titrate by anti-xa alone. Continue to monitor H&H and platelets daily while on heparin gtt.  08-28-2003 Caileigh Canche 12/02/2021,5:09 PM

## 2021-12-02 NOTE — Assessment & Plan Note (Addendum)
Continue Fenofibrate and pravastatin

## 2021-12-03 ENCOUNTER — Other Ambulatory Visit (HOSPITAL_COMMUNITY): Payer: Self-pay

## 2021-12-03 ENCOUNTER — Telehealth (HOSPITAL_COMMUNITY): Payer: Self-pay | Admitting: Pharmacy Technician

## 2021-12-03 DIAGNOSIS — I5031 Acute diastolic (congestive) heart failure: Secondary | ICD-10-CM | POA: Diagnosis not present

## 2021-12-03 DIAGNOSIS — I10 Essential (primary) hypertension: Secondary | ICD-10-CM | POA: Diagnosis not present

## 2021-12-03 DIAGNOSIS — J9601 Acute respiratory failure with hypoxia: Secondary | ICD-10-CM | POA: Diagnosis not present

## 2021-12-03 DIAGNOSIS — E669 Obesity, unspecified: Secondary | ICD-10-CM

## 2021-12-03 DIAGNOSIS — I482 Chronic atrial fibrillation, unspecified: Secondary | ICD-10-CM

## 2021-12-03 DIAGNOSIS — E876 Hypokalemia: Secondary | ICD-10-CM

## 2021-12-03 DIAGNOSIS — E119 Type 2 diabetes mellitus without complications: Secondary | ICD-10-CM | POA: Diagnosis not present

## 2021-12-03 LAB — CBC
HCT: 41.3 % (ref 39.0–52.0)
Hemoglobin: 13.8 g/dL (ref 13.0–17.0)
MCH: 31.7 pg (ref 26.0–34.0)
MCHC: 33.4 g/dL (ref 30.0–36.0)
MCV: 94.7 fL (ref 80.0–100.0)
Platelets: 213 10*3/uL (ref 150–400)
RBC: 4.36 MIL/uL (ref 4.22–5.81)
RDW: 13.2 % (ref 11.5–15.5)
WBC: 13 10*3/uL — ABNORMAL HIGH (ref 4.0–10.5)
nRBC: 0 % (ref 0.0–0.2)

## 2021-12-03 LAB — BASIC METABOLIC PANEL
Anion gap: 11 (ref 5–15)
BUN: 19 mg/dL (ref 8–23)
CO2: 26 mmol/L (ref 22–32)
Calcium: 8.7 mg/dL — ABNORMAL LOW (ref 8.9–10.3)
Chloride: 100 mmol/L (ref 98–111)
Creatinine, Ser: 1.12 mg/dL (ref 0.61–1.24)
GFR, Estimated: >60 mL/min (ref >60)
Glucose, Bld: 201 mg/dL — ABNORMAL HIGH (ref 70–99)
Potassium: 3.2 mmol/L — ABNORMAL LOW (ref 3.5–5.1)
Sodium: 137 mmol/L (ref 135–145)

## 2021-12-03 LAB — URINALYSIS, COMPLETE (UACMP) WITH MICROSCOPIC
Bacteria, UA: NONE SEEN
Bilirubin Urine: NEGATIVE
Glucose, UA: 150 mg/dL — AB
Hgb urine dipstick: NEGATIVE
Ketones, ur: NEGATIVE mg/dL
Nitrite: NEGATIVE
Protein, ur: 30 mg/dL — AB
Specific Gravity, Urine: 1.02 (ref 1.005–1.030)
Squamous Epithelial / HPF: NONE SEEN (ref 0–5)
pH: 5 (ref 5.0–8.0)

## 2021-12-03 LAB — GLUCOSE, CAPILLARY
Glucose-Capillary: 242 mg/dL — ABNORMAL HIGH (ref 70–99)
Glucose-Capillary: 342 mg/dL — ABNORMAL HIGH (ref 70–99)
Glucose-Capillary: 333 mg/dL — ABNORMAL HIGH (ref 70–99)
Glucose-Capillary: 325 mg/dL — ABNORMAL HIGH (ref 70–99)

## 2021-12-03 LAB — HEPARIN LEVEL (UNFRACTIONATED): Heparin Unfractionated: >1.1 IU/mL — ABNORMAL HIGH (ref 0.30–0.70)

## 2021-12-03 LAB — MAGNESIUM: Magnesium: 2.2 mg/dL (ref 1.7–2.4)

## 2021-12-03 MED ORDER — METOPROLOL SUCCINATE ER 50 MG PO TB24
50.0000 mg | ORAL_TABLET | Freq: Every morning | ORAL | Status: DC
  Administered 2021-12-04: 50 mg via ORAL
  Filled 2021-12-03: qty 1

## 2021-12-03 MED ORDER — POTASSIUM CHLORIDE CRYS ER 20 MEQ PO TBCR
40.0000 meq | EXTENDED_RELEASE_TABLET | Freq: Once | ORAL | Status: AC
  Administered 2021-12-03: 40 meq via ORAL
  Filled 2021-12-03: qty 2

## 2021-12-03 MED ORDER — FUROSEMIDE 10 MG/ML IJ SOLN
40.0000 mg | Freq: Every day | INTRAMUSCULAR | Status: DC
  Administered 2021-12-04: 40 mg via INTRAVENOUS
  Filled 2021-12-03: qty 4

## 2021-12-03 MED ORDER — POTASSIUM CHLORIDE CRYS ER 20 MEQ PO TBCR: 40.0000 meq | EXTENDED_RELEASE_TABLET | Freq: Once | ORAL | Status: DC

## 2021-12-03 MED ORDER — METOPROLOL SUCCINATE ER 50 MG PO TB24: 50.0000 mg | ORAL_TABLET | Freq: Every morning | ORAL | Status: DC

## 2021-12-03 NOTE — Assessment & Plan Note (Signed)
Secondary to diuretic therapy  Supplement potassium 

## 2021-12-03 NOTE — Progress Notes (Signed)
Progress Note  Patient Name: Kristopher Oneal Date of Encounter: 12/03/2021  [facility] Cardiologist: Julien Nordmann, MD   Subjective   No events overnight Breathing slowly improving Reports significant urine output on IV Lasix Renal function stable, blood pressure has improved now 130 systolic, typically runs 120s to 130 systolic as outpatient Telemetry reviewed, atrial fibrillation rate 106 at rest supine in bed  Inpatient Medications    Scheduled Meds:  apixaban  5 mg Oral BID   aspirin EC  325 mg Oral Q breakfast   diltiazem  240 mg Oral QPM   fenofibrate  160 mg Oral Daily   [START ON 12/04/2021] furosemide  40 mg Intravenous Daily   gabapentin  900 mg Oral QHS   heparin  5,000 Units Intravenous Once   insulin aspart  0-5 Units Subcutaneous QHS   insulin aspart  0-9 Units Subcutaneous TID WC   insulin aspart protamine- aspart  18 Units Subcutaneous Q supper   insulin aspart protamine- aspart  35 Units Subcutaneous Q breakfast   metoprolol succinate  100 mg Oral QHS   metoprolol succinate  50 mg Oral q morning   multivitamin with minerals  1 tablet Oral Daily   pravastatin  40 mg Oral q1800   venlafaxine XR  37.5 mg Oral Q breakfast   Continuous Infusions:  PRN Meds: acetaminophen, albuterol, dextromethorphan-guaiFENesin, hydrALAZINE, metoprolol tartrate, ondansetron (ZOFRAN) IV   Vital Signs    Vitals:   12/02/21 2333 12/03/21 0345 12/03/21 0450 12/03/21 0745  BP: 132/82 134/74  116/70  Pulse: (!) 103 (!) 102  (!) 106  Resp: 20 18  16   Temp: 98.1 F (36.7 C) 98.1 F (36.7 C)  98.7 F (37.1 C)  TempSrc: Oral   Oral  SpO2: 97% 97%  95%  Weight:   125.2 kg   Height:        Intake/Output Summary (Last 24 hours) at 12/03/2021 1007 Last data filed at 12/03/2021 0600 Gross per 24 hour  Intake 400 ml  Output 1080 ml  Net -680 ml      12/03/2021    4:50 AM 12/02/2021   10:51 AM 11/21/2021    4:09 PM  Last 3 Weights  Weight (lbs) 276 lb 0.3 oz 277 lb 12.5  oz 279 lb  Weight (kg) 125.2 kg 126 kg 126.554 kg      Telemetry    Atrial fib rate 106- Personally Reviewed  ECG     - Personally Reviewed  Physical Exam   GEN: No acute distress.   Neck: No JVD Cardiac: Irregularly irregular no murmurs, rubs, or gallops.  Respiratory: Clear to auscultation bilaterally. GI: Soft, nontender, non-distended  MS: No edema; No deformity. Neuro:  Nonfocal  Psych: Normal affect   Labs    High Sensitivity Troponin:   Recent Labs  Lab 12/02/21 1058  TROPONINIHS 6     Chemistry Recent Labs  Lab 12/02/21 1058 12/02/21 1349 12/03/21 0148  NA 134*  --  137  K 4.1  --  3.2*  CL 102  --  100  CO2 21*  --  26  GLUCOSE 284*  --  201*  BUN 19  --  19  CREATININE 1.10  --  1.12  CALCIUM 8.8*  --  8.7*  MG  --  1.6* 2.2  GFRNONAA >60  --  >60  ANIONGAP 11  --  11    Lipids No results for input(s): "CHOL", "TRIG", "HDL", "LABVLDL", "LDLCALC", "CHOLHDL" in the last 168  hours.  Hematology Recent Labs  Lab 12/02/21 1058 12/03/21 0148  WBC 14.5* 13.0*  RBC 4.46 4.36  HGB 14.2 13.8  HCT 43.1 41.3  MCV 96.6 94.7  MCH 31.8 31.7  MCHC 32.9 33.4  RDW 13.3 13.2  PLT 226 213   Thyroid No results for input(s): "TSH", "FREET4" in the last 168 hours.  BNP Recent Labs  Lab 12/02/21 1058  BNP 241.3*    DDimer No results for input(s): "DDIMER" in the last 168 hours.   Radiology    ECHOCARDIOGRAM COMPLETE  Result Date: 12/02/2021    ECHOCARDIOGRAM REPORT   Patient Name:   [facility] Date of Exam: 12/02/2021 Medical Rec #:  [id number]   Height:       74.0 in Accession #:    [id number]  Weight:       277.8 lb Date of Birth:  Apr 05, 1951    BSA:          2.499 m Patient Age:    71 years    BP:           153/79 mmHg Patient Gender: M           HR:           124 bpm. Exam Location:  [facility] Procedure: 2D Echo, Cardiac Doppler, Color Doppler and Intracardiac            Opacification Agent Indications:     I50.31 CHF Acute Diastolic  History:          Patient has no prior history of Echocardiogram examinations.                  CHF, Arrythmias:Atrial Fibrillation; Risk Factors:Diabetes,                  Hypertension, Dyslipidemia and Former Smoker.  Sonographer:     Ceasar Mons Referring Phys:  [id number] Lorretta Harp Diagnosing Phys: Julien Nordmann MD  Sonographer Comments: Technically difficult study due to poor echo windows. Image acquisition challenging due to patient body habitus. IMPRESSIONS  1. Left ventricular ejection fraction, by estimation, is 60 to 65%. The left ventricle has normal function. The left ventricle has no regional wall motion abnormalities. There is mild left ventricular hypertrophy. Left ventricular diastolic parameters are indeterminate.  2. Right ventricular systolic function is normal. The right ventricular size is normal.  3. The mitral valve is normal in structure. No evidence of mitral valve regurgitation. No evidence of mitral stenosis.  4. The aortic valve is normal in structure. Aortic valve regurgitation is not visualized. Aortic valve sclerosis/calcification is present, without any evidence of aortic stenosis.  5. The inferior vena cava is normal in size with greater than 50% respiratory variability, suggesting right atrial pressure of 3 mmHg.  6. Rhythm is atrial fibrillation  7. There is borderline dilatation of the aortic root, measuring 38 mm. FINDINGS  Left Ventricle: Left ventricular ejection fraction, by estimation, is 60 to 65%. The left ventricle has normal function. The left ventricle has no regional wall motion abnormalities. Definity contrast agent was given IV to delineate the left ventricular  endocardial borders. The left ventricular internal cavity size was normal in size. There is mild left ventricular hypertrophy. Left ventricular diastolic parameters are indeterminate. Right Ventricle: The right ventricular size is normal. No increase in right ventricular wall thickness. Right ventricular systolic function is  normal. Left Atrium: Left atrial size was normal in size. Right Atrium: Right atrial size was normal in size. Pericardium:  There is no evidence of pericardial effusion. Mitral Valve: The mitral valve is normal in structure. No evidence of mitral valve regurgitation. No evidence of mitral valve stenosis. Tricuspid Valve: The tricuspid valve is normal in structure. Tricuspid valve regurgitation is not demonstrated. No evidence of tricuspid stenosis. Aortic Valve: The aortic valve is normal in structure. Aortic valve regurgitation is not visualized. Aortic valve sclerosis/calcification is present, without any evidence of aortic stenosis. Aortic valve mean gradient measures 9.0 mmHg. Aortic valve peak  gradient measures 15.8 mmHg. Aortic valve area, by VTI measures 1.37 cm. Pulmonic Valve: The pulmonic valve was normal in structure. Pulmonic valve regurgitation is not visualized. No evidence of pulmonic stenosis. Aorta: The aortic root is normal in size and structure. There is borderline dilatation of the aortic root, measuring 38 mm. Venous: The inferior vena cava is normal in size with greater than 50% respiratory variability, suggesting right atrial pressure of 3 mmHg. IAS/Shunts: No atrial level shunt detected by color flow Doppler.  LEFT VENTRICLE PLAX 2D LVIDd:         5.08 cm LVIDs:         3.62 cm LV PW:         1.16 cm LV IVS:        1.17 cm LVOT diam:     1.80 cm LV SV:         41 LV SV Index:   16 LVOT Area:     2.54 cm  LEFT ATRIUM            Index LA diam:      4.50 cm  1.80 cm/m LA Vol (A4C): 104.0 ml 41.61 ml/m  AORTIC VALVE AV Area (Vmax):    1.48 cm AV Area (Vmean):   1.37 cm AV Area (VTI):     1.37 cm AV Vmax:           199.00 cm/s AV Vmean:          139.000 cm/s AV VTI:            0.300 m AV Peak Grad:      15.8 mmHg AV Mean Grad:      9.0 mmHg LVOT Vmax:         116.00 cm/s LVOT Vmean:        75.100 cm/s LVOT VTI:          0.162 m LVOT/AV VTI ratio: 0.54  AORTA Ao Root diam: 3.80 cm MITRAL  VALVE MV Area (PHT): 4.89 cm     SHUNTS MV Decel Time: 155 msec     Systemic VTI:  0.16 m MV E velocity: 139.00 cm/s  Systemic Diam: 1.80 cm Julien Nordmann MD Electronically signed by Julien Nordmann MD Signature Date/Time: 12/02/2021/5:44:29 PM    Final    DG Chest 2 View  Result Date: 12/02/2021 CLINICAL DATA:  Shortness of breath EXAM: CHEST - 2 VIEW COMPARISON:  None Available. FINDINGS: Transverse diameter of heart is increased.Central pulmonary vessels are prominent. Increased interstitial markings are seen in right lung and left lower lung field. There is blunting of lateral CP angles. There is no pneumothorax. IMPRESSION: Cardiomegaly. Central pulmonary vessels are prominent suggesting CHF. Increased interstitial markings are seen in both lungs, more so on the right side, possibly suggesting asymmetric pulmonary edema. Small bilateral pleural effusions. Electronically Signed   By: Ernie Avena M.D.   On: 12/02/2021 12:38    Cardiac Studies     Patient Profile     Mr.  Sam Overbeck is a 71 year old gentleman with obesity, hypertension, hyperlipidemia, diabetes type 2, permanent atrial fibrillation documented back to 2020 presenting with worsening shortness of breath  Assessment & Plan    Atrial fibrillation with RVR Elevated rate in the setting of diastolic CHF, hypertension,  -Would recommend we continue diltiazem extended release 240 daily metoprolol succinate 100 every afternoon -Will add additional dose metoprolol succinate 50 mg in the morning -If rate continues to run high, could add digoxin 0.25 daily -Given elevated chads vasc 4, recommend Eliquis 5 twice daily -Will need case manager consult for patient assistance 30-day coupon for Eliquis --atrial fibrillation, likely now permanent, noted dating back to 2020 on EKG   Diastolic CHF In the setting of hypertension, atrial fibrillation with RVR, high /fluid water intake Stressed the need to moderate his salt and fluid  intake Daily weights at home and adjustment of diuretic At the time of discharge would likely need Lasix 40 daily with potassium 20 daily Previously was only taking HCTZ -Likely needs additional 24 hours of diuresis before discharge   Diabetes type 2 Elevated A1c 8.7 Calorie restriction recommended, work closely with Dr. [name]   Essential hypertension Continue diltiazem extended release 240 daily, metoprolol succinate 100 mg in the evening, 50 mg in the morning -Blood pressure better with medications as above and diuresis  Hyperlipidemia Continue pravastatin, fenofibrate    Total encounter time more than 50 minutes  Greater than 50% was spent in counseling and coordination of care with the patient   For questions or updates, please contact [facility] Please consult [email or web address] for contact info under        Signed, Julien Nordmann, MD  12/03/2021, 10:07 AM

## 2021-12-03 NOTE — Plan of Care (Signed)
Nutrition Education Note  RD consulted for nutrition education regarding new onset CHF.  Spoke with pt and wife at bedside. Pt reports feeling better today. He has a good appetite and has been consuming all of his meals. Pt typically consumes 2-3 meals per day and goes out to eat about once per week. Pt and wife shares that they live in an RV and do a lot of cooking at home, including adding fresh vegetables from their garden box. Pt admits that he consumes a lot of salt, but plans on using Mrs Sharilyn Sites at home. Pt wife also has a smoker that she desires to use to modify recipes.   Pt and wife able to teachback basic self-management information on CHF and recall Heart Failure RN visit.  RD provided "Low Sodium Nutrition Therapy" handout from the Academy of Nutrition and Dietetics. Reviewed patient's dietary recall. Provided examples on ways to decrease sodium intake in diet. Discouraged intake of processed foods and use of salt shaker. Encouraged fresh fruits and vegetables as well as whole grain sources of carbohydrates to maximize fiber intake.   RD discussed why it is important for patient to adhere to diet recommendations, and emphasized the role of fluids, foods to avoid, and importance of weighing self daily. Teach back method used.  Expect fair to good compliance.  Current diet order is 2 gram sodium, patient is consuming approximately 100% of meals at this time. Labs and medications reviewed. No further nutrition interventions warranted at this time. RD contact information provided. If additional nutrition issues arise, please re-consult RD.   Levada Schilling, RD, LDN, CDCES Registered Dietitian II Certified Diabetes Care and Education Specialist Please refer to [facility] for RD and/or RD on-call/weekend/after hours pager

## 2021-12-03 NOTE — TOC Benefit Eligibility Note (Signed)
Patient Product/process development scientist completed.    The patient is currently admitted and upon discharge could be taking Eliquis 5 mg.  The current 30 day co-pay is $149.97 due to being in coverage gap (donut hole).   The patient is insured through [employer] Part D    Roland Earl, CPhT Pharmacy Patient Advocate Specialist [facility] Patient Advocate Team Direct Number: [phone]  Fax: [phone]

## 2021-12-03 NOTE — Discharge Instructions (Signed)

## 2021-12-03 NOTE — Consult Note (Signed)
   Heart Failure Nurse Navigator Note  HFpEF 60 to 65%.  Mild LVH.  He presented to the emergency room with complaints of increasing shortness of breath and leg edema.  Comorbidities:  Hypertension Hyperlipidemia Diabetes Atrial fibrillation Anxiety Obesity  Medications:  Apixaban 5 mg 2 times a day Aspirin 325 mg once daily Diltiazem 240 mg every evening Fenofibrate 160 mg daily Metoprolol succinate 100 mg at bedtime Metoprolol succinate 50 mg in the a.m. Pravastatin 40 mg at 1800  Labs:  Sodium 137, potassium 3.2, chloride 100, CO2 26, BUN 19, creatinine 1.12, magnesium 2.2 Weight is 125.2 kg Blood pressure 114/75   Still meeting with patient and his wife, he had just returned to his room after ambulating around the nurses station with his wife.  Discussed heart failure and what it means.  Went over sodium restriction and removing the saltshaker from the table.  The wife states that he is 1 that likes salt and also likes to use hot sauce on everything.  The wife states that they do not eat at restaurants but will order a pizza once a while.  Explained that treating herself once in a while is good but just realized that meals going to be higher in sodium and to restrict the other meals for that day.  Also discussed the importance of fluid restriction of 64 ounces.  He states that he has a 32 ounce yeti cup that he feels with water and ice and will drink 3-4 those a day.  Dr. Mariah Milling explained to them that he only should be doing and a half of those daily.  He also drinks a cup of coffee with breakfast.  Discussed the importance of daily weights, recording and what to report along with changes in symptoms.  He has an appointment in the outpatient heart failure clinic on September 8 at 8:30 AM.  He has a 6% no-show which is 1 out of 18 appointments.   They were given the living with heart failure teaching booklet, zone magnet, info on low-sodium and heart failure and  weight chart.  They had no further questions, we will continue to follow along.  Tresa Endo RN [facility]

## 2021-12-03 NOTE — Telephone Encounter (Signed)
Pharmacy Patient Advocate Encounter  Insurance verification completed.    The patient is insured through [employer] Part D   The patient is currently admitted and ran test claims for the following: Eliquis.  Copays and coinsurance results were relayed to Inpatient clinical team.  

## 2021-12-03 NOTE — Inpatient Diabetes Management (Signed)
Inpatient Diabetes Program Recommendations  AACE/ADA: New Consensus Statement on Inpatient Glycemic Control (2015)  Target Ranges:  Prepandial:   less than 140 mg/dL      Peak postprandial:   less than 180 mg/dL (1-2 hours)      Critically ill patients:  140 - 180 mg/dL    Latest Reference Range & Units 12/02/21 16:33 12/02/21 20:34 12/03/21 07:44 12/03/21 11:39  Glucose-Capillary 70 - 99 mg/dL 458 (H)  7 units Novolog  18 units 70/30 Insulin @1804  241 (H) 242 (H)  3 units Novolog  35 units 70/30 Insulin 342 (H)  (H): Data is abnormally high     Home DM Meds: 70/30 Insulin 50 units AM/ 25 units PM    Metformin 1000 mg BID    Glipizide 5 mg BID    Current Orders: 70/30 Insulin 35 units AM/ 18 units PM    Novolog 0-9 units TID ac/hs    MD- Please consider increasing the 70/30 Insulin to 50 units AM and 25 units in the PM (home doses)    --Will follow patient during hospitalization--  RN, MSN, CDCES Diabetes Coordinator Inpatient Glycemic Control Team Team Pager: [phone] (8a-5p)

## 2021-12-03 NOTE — Progress Notes (Signed)
Progress Note   Patient: Kristopher Oneal DOB: 1950-10-12 DOA: 12/02/2021     1 DOS: the patient was seen and examined on 12/03/2021   Brief hospital course: Kristopher Oneal is a 71 y.o. male with medical history significant of HTN, HLD, DM, A fib not on anticoagulant, anxiety, obesity with a BMI 35.66, who presents with SOB and leg edema.   Patient states that he started having shortness of breath since this morning, which has been progressively worsening.  Patient has cough with little mucus production.  Denies chest pain, fever or chills.  Patient is not using oxygen normally.  Her oxygen saturation is 89% on room air, which improved to 95% on 2 L oxygen in the ED.  Patient does not have nausea, vomiting, diarrhea or abdominal pain.  No symptoms of UTI.     Data reviewed independently and ED Course: pt was found to have BNP 241.3, troponin level 6, WBC 14.5, negative COVID PCR, GFR> 60, temperature 99.4, blood pressure 153/79, heart rate 120s, RR 28, 24, oxygen saturations 89% on room air, which improved to 95% on 2 L oxygen.  Chest x-ray showed cardiomegaly and vascular congestion with asymmetric pulmonary edema.  Patient is admitted to PCU as inpatient.  Dr. Okey Oneal of cardiology is consulted.     EKG: I have personally reviewed.  Atrial fibrillation, QTc 458, left atrial enlargement, low voltage, nonspecific T wave change.   08/15 -feels better after diuresis.  Less short of breath.  Continues to require 2 L of oxygen   Assessment and Plan: * Acute CHF [facility]) Patient presents for evaluation of shortness breath, 1+ bilateral leg edema, elevated BNP 241, vascular congestion on chest x-ray, clinically consistent with acute CHF.   2D echocardiogram shows an LVEF of 60 to 65% Continue diuretic therapy Continue metoprolol Patient has 2 L new oxygen requirement due to acute CHF and will be weaned off as tolerated Appreciate cardiology input   Chronic atrial fibrillation with RVR  (HCC) Heart rate in the 120s.  CHADS2 score is 4.  Patient started on Eliquis 5 twice daily Continue home Cardizem 240 mg daily Continue home metoprolol 100 mg daily 2D echocardiogram shows an LVEF of 60 to 65% with no evidence of valvular pathology  HTN (hypertension) Improved BP control Continue Cardizem and metoprolol  HLD (hyperlipidemia) Continue Fenofibrate and pravastatin  Diabetes mellitus without complication (HCC) Recent A1c 8.0, poorly controlled.   Patient is on metformin and glipizide which are currently on hold Continue 70/30 insulin 35 units in morning and 18 units in evening Maintain consistent carbohydrate diet  Hypokalemia Secondary to diuretic therapy Supplement potassium  Hypomagnesemia Supplemented  Obesity with body mass index (BMI) of 30.0 to 39.9 BMI= 35.44 kg/m2   and BW= 125.2 Complicates overall prognosis and care. Lifestyle modification and exercise has been discussed with patient in detail.    Leukocytosis WBC 14.5 >. 13.0, no source of infection identified.   Patient denies fever or chills.  May be reactive. Urine analysis is sterile We will continue to monitor        Subjective: Patient is seen and examined at bedside.  Feels better and is less short of breath.  Physical Exam: Vitals:   12/03/21 0345 12/03/21 0450 12/03/21 0745 12/03/21 1136  BP: 134/74  116/70 114/75  Pulse: (!) 102  (!) 106 95  Resp: 18  16 18   Temp: 98.1 F (36.7 C)  98.7 F (37.1 C) 98.1 F (36.7 C)  TempSrc:  Oral Oral  SpO2: 97%  95% 96%  Weight:  125.2 kg    Height:       General: Not in acute distress HEENT:       Eyes: PERRL, EOMI, no scleral icterus.       ENT: No discharge from the ears and nose, no pharynx injection, no tonsillar enlargement.        Neck: Difficult to assess JVD due to obesity, no bruit, no mass felt. Heme: No neck lymph node enlargement. Cardiac: S1/S2, 1/6 systolic murmur, irregularly irregular rhythm, No gallops or  rubs. Respiratory: No rales, wheezing, rhonchi or rubs. GI: Soft, nondistended, nontender, no rebound pain, no organomegaly, BS present. GU: No hematuria Ext: 1+ pitting leg edema bilaterally. 1+DP/PT pulse bilaterally. Musculoskeletal: No joint deformities, No joint redness or warmth, no limitation of ROM in spin. Skin: No rashes.  Neuro: Alert, oriented X3, cranial nerves II-XII grossly intact, moves all extremities normally.  Psych: Patient is not psychotic, no suicidal or hemocidal ideation.    Data Reviewed: Relevant notes from primary care and specialist visits, past discharge summaries as available in EHR, including Care Everywhere. Prior diagnostic testing as pertinent to current admission diagnoses Updated medications and problem lists for reconciliation ED course, including vitals, labs, imaging, treatment and response to treatment Triage notes, nursing and pharmacy notes and ED provider's notes Notable results as noted in HPI Labs reviewed.  Patient noted to have hyperglycemia and leukocytosis which shows a downward trend There are no new results to review at this time.  Family Communication: Greater than 50% of time was spent discussing patient's condition and plan of care with him and his wife at the bedside.  All questions and concerns have been addressed.  Disposition: Status is: Inpatient Remains inpatient appropriate because: On IV diuretics for acute CHF as well as oxygen supplementation.  Planned Discharge Destination: Home    Time spent: 32 minutes  Author: Lucile Shutters, MD 12/03/2021 12:40 PM  For on call review [email or web address].

## 2021-12-04 DIAGNOSIS — I482 Chronic atrial fibrillation, unspecified: Secondary | ICD-10-CM | POA: Diagnosis not present

## 2021-12-04 DIAGNOSIS — I5031 Acute diastolic (congestive) heart failure: Secondary | ICD-10-CM | POA: Diagnosis not present

## 2021-12-04 LAB — BASIC METABOLIC PANEL
Anion gap: 10 (ref 5–15)
BUN: 27 mg/dL — ABNORMAL HIGH (ref 8–23)
CO2: 26 mmol/L (ref 22–32)
Calcium: 8.8 mg/dL — ABNORMAL LOW (ref 8.9–10.3)
Chloride: 99 mmol/L (ref 98–111)
Creatinine, Ser: 1.25 mg/dL — ABNORMAL HIGH (ref 0.61–1.24)
GFR, Estimated: >60 mL/min (ref >60)
Glucose, Bld: 370 mg/dL — ABNORMAL HIGH (ref 70–99)
Potassium: 4.4 mmol/L (ref 3.5–5.1)
Sodium: 135 mmol/L (ref 135–145)

## 2021-12-04 LAB — CBC
HCT: 39.2 % (ref 39.0–52.0)
Hemoglobin: 12.5 g/dL — ABNORMAL LOW (ref 13.0–17.0)
MCH: 30.9 pg (ref 26.0–34.0)
MCHC: 31.9 g/dL (ref 30.0–36.0)
MCV: 96.8 fL (ref 80.0–100.0)
Platelets: 212 10*3/uL (ref 150–400)
RBC: 4.05 MIL/uL — ABNORMAL LOW (ref 4.22–5.81)
RDW: 13.2 % (ref 11.5–15.5)
WBC: 9.2 10*3/uL (ref 4.0–10.5)
nRBC: 0 % (ref 0.0–0.2)

## 2021-12-04 LAB — MAGNESIUM: Magnesium: 2.2 mg/dL (ref 1.7–2.4)

## 2021-12-04 LAB — GLUCOSE, CAPILLARY: Glucose-Capillary: 286 mg/dL — ABNORMAL HIGH (ref 70–99)

## 2021-12-04 MED ORDER — APIXABAN 5 MG PO TABS: 5.0000 mg | ORAL_TABLET | Freq: Two times a day (BID) | ORAL | 1 refills | Status: DC

## 2021-12-04 MED ORDER — FUROSEMIDE 40 MG PO TABS: 20.0000 mg | ORAL_TABLET | Freq: Every day | ORAL | 1 refills | Status: DC

## 2021-12-04 MED ORDER — INSULIN ASPART PROT & ASPART (70-30 MIX) 100 UNIT/ML ~~LOC~~ SUSP
25.0000 [IU] | Freq: Every day | SUBCUTANEOUS | Status: DC
  Filled 2021-12-04: qty 10

## 2021-12-04 MED ORDER — FUROSEMIDE 40 MG PO TABS
40.0000 mg | ORAL_TABLET | Freq: Every day | ORAL | Status: DC
  Administered 2021-12-04: 40 mg via ORAL
  Filled 2021-12-04: qty 1

## 2021-12-04 MED ORDER — ASPIRIN 81 MG PO TBEC: 81.0000 mg | DELAYED_RELEASE_TABLET | Freq: Every day | ORAL | Status: DC

## 2021-12-04 MED ORDER — INSULIN ASPART PROT & ASPART (70-30 MIX) 100 UNIT/ML ~~LOC~~ SUSP: 50.0000 [IU] | Freq: Every day | SUBCUTANEOUS | Status: DC

## 2021-12-04 MED ORDER — POTASSIUM CHLORIDE CRYS ER 20 MEQ PO TBCR
40.0000 meq | EXTENDED_RELEASE_TABLET | Freq: Once | ORAL | Status: AC
  Administered 2021-12-04: 40 meq via ORAL
  Filled 2021-12-04: qty 2

## 2021-12-04 MED ORDER — POTASSIUM CHLORIDE 20 MEQ PO PACK
20.0000 meq | PACK | Freq: Every day | ORAL | Status: DC
  Administered 2021-12-04: 20 meq via ORAL
  Filled 2021-12-04: qty 1

## 2021-12-04 NOTE — Progress Notes (Signed)
Progress Note  Patient Name: Kristopher Oneal Date of Encounter: 12/04/2021  [facility] Cardiologist: Julien Nordmann, MD   Subjective   Shortness of breath better, denies chest pain, adequate diuresis on IV Lasix.  Inpatient Medications    Scheduled Meds:  apixaban  5 mg Oral BID   diltiazem  240 mg Oral QPM   fenofibrate  160 mg Oral Daily   furosemide  40 mg Oral Daily   gabapentin  900 mg Oral QHS   insulin aspart protamine- aspart  25 Units Subcutaneous Q supper   [START ON 12/05/2021] insulin aspart protamine- aspart  50 Units Subcutaneous Q breakfast   metoprolol succinate  100 mg Oral QHS   metoprolol succinate  50 mg Oral q AM   multivitamin with minerals  1 tablet Oral Daily   potassium chloride  20 mEq Oral Daily   pravastatin  40 mg Oral q1800   venlafaxine XR  37.5 mg Oral Q breakfast   Continuous Infusions:  PRN Meds: acetaminophen, albuterol, dextromethorphan-guaiFENesin, hydrALAZINE, metoprolol tartrate, ondansetron (ZOFRAN) IV   Vital Signs    Vitals:   12/03/21 2342 12/04/21 0408 12/04/21 0715 12/04/21 1110  BP: 102/65 130/68 122/70 105/63  Pulse: 99 84 84 97  Resp: 20 18 18 18   Temp: 98.1 F (36.7 C) 97.7 F (36.5 C) 98 F (36.7 C) 97.8 F (36.6 C)  TempSrc: Oral Oral Oral Oral  SpO2: 96% 97% 99% 97%  Weight:      Height:        Intake/Output Summary (Last 24 hours) at 12/04/2021 1311 Last data filed at 12/04/2021 0909 Gross per 24 hour  Intake 1080 ml  Output 3100 ml  Net -2020 ml      12/03/2021    4:50 AM 12/02/2021   10:51 AM 11/21/2021    4:09 PM  Last 3 Weights  Weight (lbs) 276 lb 0.3 oz 277 lb 12.5 oz 279 lb  Weight (kg) 125.2 kg 126 kg 126.554 kg      Telemetry    Atrial fibrillation- Personally Reviewed  ECG    Normal sinus rhythm- Personally Reviewed  Physical Exam   GEN: No acute distress.   Neck: No JVD Cardiac: Irregular irregular Respiratory: Diminished breath sounds, no wheezing GI: Soft, nontender,  non-distended  MS: Trace edema; No deformity. Neuro:  Nonfocal  Psych: Normal affect   Labs    High Sensitivity Troponin:   Recent Labs  Lab 12/02/21 1058  TROPONINIHS 6     Chemistry Recent Labs  Lab 12/02/21 1058 12/02/21 1349 12/03/21 0148 12/04/21 1141  NA 134*  --  137 135  K 4.1  --  3.2* 4.4  CL 102  --  100 99  CO2 21*  --  26 26  GLUCOSE 284*  --  201* 370*  BUN 19  --  19 27*  CREATININE 1.10  --  1.12 1.25*  CALCIUM 8.8*  --  8.7* 8.8*  MG  --  1.6* 2.2 2.2  GFRNONAA >60  --  >60 >60  ANIONGAP 11  --  11 10    Lipids No results for input(s): "CHOL", "TRIG", "HDL", "LABVLDL", "LDLCALC", "CHOLHDL" in the last 168 hours.  Hematology Recent Labs  Lab 12/02/21 1058 12/03/21 0148 12/04/21 0513  WBC 14.5* 13.0* 9.2  RBC 4.46 4.36 4.05*  HGB 14.2 13.8 12.5*  HCT 43.1 41.3 39.2  MCV 96.6 94.7 96.8  MCH 31.8 31.7 30.9  MCHC 32.9 33.4 31.9  RDW 13.3 13.2  13.2  PLT 226 213 212   Thyroid No results for input(s): "TSH", "FREET4" in the last 168 hours.  BNP Recent Labs  Lab 12/02/21 1058  BNP 241.3*    DDimer No results for input(s): "DDIMER" in the last 168 hours.   Radiology    ECHOCARDIOGRAM COMPLETE  Result Date: 12/02/2021    ECHOCARDIOGRAM REPORT   Patient Name:   Kristopher Oneal Date of Exam: 12/02/2021 Medical Rec #:  [id number]   Height:       74.0 in Accession #:    [id number]  Weight:       277.8 lb Date of Birth:  [birth year]    BSA:          2.499 m Patient Age:    71 years    BP:           153/79 mmHg Patient Gender: M           HR:           124 bpm. Exam Location:  [facility] Procedure: 2D Echo, Cardiac Doppler, Color Doppler and Intracardiac            Opacification Agent Indications:     I50.31 CHF Acute Diastolic  History:         Patient has no prior history of Echocardiogram examinations.                  CHF, Arrythmias:Atrial Fibrillation; Risk Factors:Diabetes,                  Hypertension, Dyslipidemia and Former Smoker.  Sonographer:     Ceasar Mons Referring Phys:  [id number] Lorretta Harp Diagnosing Phys: Julien Nordmann MD  Sonographer Comments: Technically difficult study due to poor echo windows. Image acquisition challenging due to patient body habitus. IMPRESSIONS  1. Left ventricular ejection fraction, by estimation, is 60 to 65%. The left ventricle has normal function. The left ventricle has no regional wall motion abnormalities. There is mild left ventricular hypertrophy. Left ventricular diastolic parameters are indeterminate.  2. Right ventricular systolic function is normal. The right ventricular size is normal.  3. The mitral valve is normal in structure. No evidence of mitral valve regurgitation. No evidence of mitral stenosis.  4. The aortic valve is normal in structure. Aortic valve regurgitation is not visualized. Aortic valve sclerosis/calcification is present, without any evidence of aortic stenosis.  5. The inferior vena cava is normal in size with greater than 50% respiratory variability, suggesting right atrial pressure of 3 mmHg.  6. Rhythm is atrial fibrillation  7. There is borderline dilatation of the aortic root, measuring 38 mm. FINDINGS  Left Ventricle: Left ventricular ejection fraction, by estimation, is 60 to 65%. The left ventricle has normal function. The left ventricle has no regional wall motion abnormalities. Definity contrast agent was given IV to delineate the left ventricular  endocardial borders. The left ventricular internal cavity size was normal in size. There is mild left ventricular hypertrophy. Left ventricular diastolic parameters are indeterminate. Right Ventricle: The right ventricular size is normal. No increase in right ventricular wall thickness. Right ventricular systolic function is normal. Left Atrium: Left atrial size was normal in size. Right Atrium: Right atrial size was normal in size. Pericardium: There is no evidence of pericardial effusion. Mitral Valve: The mitral valve is normal in structure. No  evidence of mitral valve regurgitation. No evidence of mitral valve stenosis. Tricuspid Valve: The tricuspid valve is normal in structure. Tricuspid valve regurgitation is  not demonstrated. No evidence of tricuspid stenosis. Aortic Valve: The aortic valve is normal in structure. Aortic valve regurgitation is not visualized. Aortic valve sclerosis/calcification is present, without any evidence of aortic stenosis. Aortic valve mean gradient measures 9.0 mmHg. Aortic valve peak  gradient measures 15.8 mmHg. Aortic valve area, by VTI measures 1.37 cm. Pulmonic Valve: The pulmonic valve was normal in structure. Pulmonic valve regurgitation is not visualized. No evidence of pulmonic stenosis. Aorta: The aortic root is normal in size and structure. There is borderline dilatation of the aortic root, measuring 38 mm. Venous: The inferior vena cava is normal in size with greater than 50% respiratory variability, suggesting right atrial pressure of 3 mmHg. IAS/Shunts: No atrial level shunt detected by color flow Doppler.  LEFT VENTRICLE PLAX 2D LVIDd:         5.08 cm LVIDs:         3.62 cm LV PW:         1.16 cm LV IVS:        1.17 cm LVOT diam:     1.80 cm LV SV:         41 LV SV Index:   16 LVOT Area:     2.54 cm  LEFT ATRIUM            Index LA diam:      4.50 cm  1.80 cm/m LA Vol (A4C): 104.0 ml 41.61 ml/m  AORTIC VALVE AV Area (Vmax):    1.48 cm AV Area (Vmean):   1.37 cm AV Area (VTI):     1.37 cm AV Vmax:           199.00 cm/s AV Vmean:          139.000 cm/s AV VTI:            0.300 m AV Peak Grad:      15.8 mmHg AV Mean Grad:      9.0 mmHg LVOT Vmax:         116.00 cm/s LVOT Vmean:        75.100 cm/s LVOT VTI:          0.162 m LVOT/AV VTI ratio: 0.54  AORTA Ao Root diam: 3.80 cm MITRAL VALVE MV Area (PHT): 4.89 cm     SHUNTS MV Decel Time: 155 msec     Systemic VTI:  0.16 m MV E velocity: 139.00 cm/s  Systemic Diam: 1.80 cm Julien Nordmann MD Electronically signed by Julien Nordmann MD Signature Date/Time:  12/02/2021/5:44:29 PM    Final     Cardiac Studies   TTE 12/02/2021 1. Left ventricular ejection fraction, by estimation, is 60 to 65%. The  left ventricle has normal function. The left ventricle has no regional  wall motion abnormalities. There is mild left ventricular hypertrophy.  Left ventricular diastolic parameters  are indeterminate.   2. Right ventricular systolic function is normal. The right ventricular  size is normal.   3. The mitral valve is normal in structure. No evidence of mitral valve  regurgitation. No evidence of mitral stenosis.   4. The aortic valve is normal in structure. Aortic valve regurgitation is  not visualized. Aortic valve sclerosis/calcification is present, without  any evidence of aortic stenosis.   5. The inferior vena cava is normal in size with greater than 50%  respiratory variability, suggesting right atrial pressure of 3 mmHg.   6. Rhythm is atrial fibrillation   7. There is borderline dilatation of the aortic root, measuring 38  mm.  Patient Profile     71 y.o. male with history of permanent atrial fibrillation, hypertension, hyperlipidemia, obesity presenting with shortness of breath being seen for HFpEF and atrial fibrillation  Assessment & Plan    HFpEF -Shortness of breath improved with diuresing, appears minimally overloaded with trace edema -Start p.o. Lasix 40 mg daily  2.  Permanent A-fib -Toprol-XL 50 mg a.m., 100 p.m. -Diltiazem, Eliquis.  3.  Hyperlipidemia -Continue statin  Okay for discharge from a cardiac perspective, close follow-up as outpatient advised.  Total encounter time more than 50 minutes  Greater than 50% was spent in counseling and coordination of care with the patient    Signed, Debbe Odea, MD  12/04/2021, 1:11 PM

## 2021-12-04 NOTE — Progress Notes (Signed)
   Heart Failure Nurse Navigator Note  Met with patient who was sitting up in the chair and wife also present.  Teach back method discussed items from yesterday.  Needed very little reinforcement.  Wife questions how active he can be.  Advised to slowly increase his activities such as walking.  States that up to a [place] and [place] ago [place]  [place] again discussed slowly increasing activities and listening to his body when he is tired he knows that he should rest.  Also states [place] notices a burning in the muscles of his thighs and calves.  This does go away with rest.  Asked that he discuss this with his family doctor.  They had no further questions.  Pricilla Riffle RN [facility]

## 2021-12-04 NOTE — Inpatient Diabetes Management (Signed)
Inpatient Diabetes Program Recommendations  AACE/ADA: New Consensus Statement on Inpatient Glycemic Control (2015)  Target Ranges:  Prepandial:   less than 140 mg/dL      Peak postprandial:   less than 180 mg/dL (1-2 hours)      Critically ill patients:  140 - 180 mg/dL   Lab Results  Component Value Date   GLUCAP 286 (H) 12/04/2021   HGBA1C 8.0 (H) 11/08/2020    Home DM Meds: 70/30 Insulin 50 units AM/ 25 units PM    Metformin 1000 mg BID    Glipizide 5 mg BID      Current Orders: 70/30 Insulin 35 units AM/ 18 units PM               Novolog 0-9 units TID ac/hs      MD- Please consider: -Increase 70/30 Insulin to 50 units AM and 25 units in the PM (home doses)  Thank you, Billy Fischer. Cordelia Bessinger, RN, MSN, CDE  Diabetes Coordinator Inpatient Glycemic Control Team Team Pager [phone] (8am-5pm) 12/04/2021 10:07 AM

## 2021-12-04 NOTE — Discharge Summary (Signed)
Kristopher Oneal VEL:[id number] DOB: 12-11-1950 DOA: 12/02/2021  PCP: Danella Penton, MD  Admit date: 12/02/2021 Discharge date: 12/04/2021  Time spent: 40 minutes  Recommendations for Outpatient Follow-up:  Pcp f/u 1 week, needs check of electrolytes and kidney function Cardiology f/u     Discharge Diagnoses:  Principal Problem:   Acute CHF [facility]) Active Problems:   Chronic atrial fibrillation with RVR (HCC)   HTN (hypertension)   HLD (hyperlipidemia)   Diabetes mellitus without complication (HCC)   Leukocytosis   Obesity with body mass index (BMI) of 30.0 to 39.9   Hypomagnesemia   Hypokalemia   Discharge Condition: improved  Diet recommendation: heart healthy  Filed Weights   12/02/21 1051 12/03/21 0450  Weight: 126 kg 125.2 kg    History of present illness:  From admission h and p Kristopher Oneal is a 71 y.o. male with medical history significant of HTN, HLD, DM, A fib not on anticoagulant, anxiety, obesity with a BMI 35.66, who presents with SOB and leg edema.   Patient states that he started having shortness of breath since this morning, which has been progressively worsening.  Patient has cough with little mucus production.  Denies chest pain, fever or chills.  Patient is not using oxygen normally.  Her oxygen saturation is 89% on room air, which improved to 95% on 2 L oxygen in the ED.  Patient does not have nausea, vomiting, diarrhea or abdominal pain.  No symptoms of UTI.  Hospital Course:  Patient presents with chf exacerbation 2/2 a fib with rvr. Rvr resolved with diuresis; home diltiazem and metoprolol were continued. Diuresed with lasix. Symptoms much improved and weaned off oxygen, ambulating without difficulty. Lasix naive and cr increased to 1.25 from baseline of 1 after 2 days of diuresis so lasix decreased to 20 po qd at discharge and will need check of kidney function at 1 week pcp f/u. Patient not on anticoagulation at home; this was started here. TTE without  systolic dysfunction.   Procedures: none   Consultations: cardiology  Discharge Exam: Vitals:   12/04/21 0715 12/04/21 1110  BP: 122/70 105/63  Pulse: 84 97  Resp: 18 18  Temp: 98 F (36.7 C) 97.8 F (36.6 C)  SpO2: 99% 97%    General: NAD Cardiovascular: irreg irreg Respiratory: rales at bases, faint Ext: trace LE edema  Discharge Instructions   Discharge Instructions     Diet - low sodium heart healthy   Complete by: As directed    Increase activity slowly   Complete by: As directed       Allergies as of 12/04/2021   No Known Allergies      Medication List     STOP taking these medications    aspirin EC 325 MG tablet   celecoxib 200 MG capsule Commonly known as: CELEBREX   oxyCODONE 5 MG immediate release tablet Commonly known as: Oxy IR/ROXICODONE   traMADol 50 MG tablet Commonly known as: ULTRAM       TAKE these medications    Advanced Diabetic Multivitamin Tabs Take 1 tablet by mouth daily.   apixaban 5 MG Tabs tablet Commonly known as: ELIQUIS Take 1 tablet (5 mg total) by mouth 2 (two) times daily.   cyanocobalamin 500 MCG tablet Commonly known as: VITAMIN B12 Take 1,000 mcg by mouth daily.   diltiazem 240 MG 24 hr capsule Commonly known as: CARDIZEM CD Take 240 mg by mouth every evening.   fenofibrate 160 MG tablet Take 160 mg by  mouth daily.   fluticasone 50 MCG/ACT nasal spray Commonly known as: FLONASE Place 1 spray into the nose daily as needed for allergies.   furosemide 40 MG tablet Commonly known as: LASIX Take 0.5 tablets (20 mg total) by mouth daily. Start taking on: December 05, 2021   gabapentin 300 MG capsule Commonly known as: NEURONTIN Take 900 mg by mouth at bedtime.   glipiZIDE 5 MG tablet Commonly known as: GLUCOTROL Take 5 mg by mouth 2 (two) times daily.   HumuLIN 70/30 (70-30) 100 UNIT/ML injection Generic drug: insulin NPH-regular Human Inject 25-50 Units into the skin See admin instructions.  Inject 50 units into the skin in the morning and 25 units in the evening   lovastatin 40 MG tablet Commonly known as: MEVACOR Take 40 mg by mouth daily with supper.   metFORMIN 1000 MG tablet Commonly known as: GLUCOPHAGE Take 1,000 mg by mouth 2 (two) times daily with a meal.   metoprolol succinate 100 MG 24 hr tablet Commonly known as: TOPROL-XL Take 100 mg by mouth at bedtime.   triamterene-hydrochlorothiazide 37.5-25 MG tablet Commonly known as: MAXZIDE-25 Take 1 tablet by mouth daily.   venlafaxine XR 37.5 MG 24 hr capsule Commonly known as: EFFEXOR-XR Take 37.5 mg by mouth daily with breakfast.   vitamin C 1000 MG tablet Take 1,000 mg by mouth daily.       No Known Allergies  Follow-up Information     Danella Penton, MD Follow up.   Specialty: Internal Medicine Contact information: [phone] [facility] [address] [facility] [place] [address] [phone]         Antonieta Iba, MD .   Specialty: Cardiology Contact information: [address] [place] [address] [phone]                  The results of significant diagnostics from this hospitalization (including imaging, microbiology, ancillary and laboratory) are listed below for reference.    Significant Diagnostic Studies: ECHOCARDIOGRAM COMPLETE  Result Date: 12/02/2021    ECHOCARDIOGRAM REPORT   Patient Name:   Kristopher Oneal Date of Exam: 12/02/2021 Medical Rec #:  [id number]   Height:       74.0 in Accession #:    [id number]  Weight:       277.8 lb Date of Birth:  09/06/1950    BSA:          2.499 m Patient Age:    71 years    BP:           153/79 mmHg Patient Gender: M           HR:           124 bpm. Exam Location:  [facility] Procedure: 2D Echo, Cardiac Doppler, Color Doppler and Intracardiac            Opacification Agent Indications:     I50.31 CHF Acute Diastolic  History:         Patient has no prior history of Echocardiogram examinations.                   CHF, Arrythmias:Atrial Fibrillation; Risk Factors:Diabetes,                  Hypertension, Dyslipidemia and Former Smoker.  Sonographer:     Ceasar Mons Referring Phys:  [id number] Lorretta Harp Diagnosing Phys: Julien Nordmann MD  Sonographer Comments: Technically difficult study due to poor echo windows. Image acquisition challenging  due to patient body habitus. IMPRESSIONS  1. Left ventricular ejection fraction, by estimation, is 60 to 65%. The left ventricle has normal function. The left ventricle has no regional wall motion abnormalities. There is mild left ventricular hypertrophy. Left ventricular diastolic parameters are indeterminate.  2. Right ventricular systolic function is normal. The right ventricular size is normal.  3. The mitral valve is normal in structure. No evidence of mitral valve regurgitation. No evidence of mitral stenosis.  4. The aortic valve is normal in structure. Aortic valve regurgitation is not visualized. Aortic valve sclerosis/calcification is present, without any evidence of aortic stenosis.  5. The inferior vena cava is normal in size with greater than 50% respiratory variability, suggesting right atrial pressure of 3 mmHg.  6. Rhythm is atrial fibrillation  7. There is borderline dilatation of the aortic root, measuring 38 mm. FINDINGS  Left Ventricle: Left ventricular ejection fraction, by estimation, is 60 to 65%. The left ventricle has normal function. The left ventricle has no regional wall motion abnormalities. Definity contrast agent was given IV to delineate the left ventricular  endocardial borders. The left ventricular internal cavity size was normal in size. There is mild left ventricular hypertrophy. Left ventricular diastolic parameters are indeterminate. Right Ventricle: The right ventricular size is normal. No increase in right ventricular wall thickness. Right ventricular systolic function is normal. Left Atrium: Left atrial size was normal in size. Right Atrium: Right  atrial size was normal in size. Pericardium: There is no evidence of pericardial effusion. Mitral Valve: The mitral valve is normal in structure. No evidence of mitral valve regurgitation. No evidence of mitral valve stenosis. Tricuspid Valve: The tricuspid valve is normal in structure. Tricuspid valve regurgitation is not demonstrated. No evidence of tricuspid stenosis. Aortic Valve: The aortic valve is normal in structure. Aortic valve regurgitation is not visualized. Aortic valve sclerosis/calcification is present, without any evidence of aortic stenosis. Aortic valve mean gradient measures 9.0 mmHg. Aortic valve peak  gradient measures 15.8 mmHg. Aortic valve area, by VTI measures 1.37 cm. Pulmonic Valve: The pulmonic valve was normal in structure. Pulmonic valve regurgitation is not visualized. No evidence of pulmonic stenosis. Aorta: The aortic root is normal in size and structure. There is borderline dilatation of the aortic root, measuring 38 mm. Venous: The inferior vena cava is normal in size with greater than 50% respiratory variability, suggesting right atrial pressure of 3 mmHg. IAS/Shunts: No atrial level shunt detected by color flow Doppler.  LEFT VENTRICLE PLAX 2D LVIDd:         5.08 cm LVIDs:         3.62 cm LV PW:         1.16 cm LV IVS:        1.17 cm LVOT diam:     1.80 cm LV SV:         41 LV SV Index:   16 LVOT Area:     2.54 cm  LEFT ATRIUM            Index LA diam:      4.50 cm  1.80 cm/m LA Vol (A4C): 104.0 ml 41.61 ml/m  AORTIC VALVE AV Area (Vmax):    1.48 cm AV Area (Vmean):   1.37 cm AV Area (VTI):     1.37 cm AV Vmax:           199.00 cm/s AV Vmean:          139.000 cm/s AV VTI:  0.300 m AV Peak Grad:      15.8 mmHg AV Mean Grad:      9.0 mmHg LVOT Vmax:         116.00 cm/s LVOT Vmean:        75.100 cm/s LVOT VTI:          0.162 m LVOT/AV VTI ratio: 0.54  AORTA Ao Root diam: 3.80 cm MITRAL VALVE MV Area (PHT): 4.89 cm     SHUNTS MV Decel Time: 155 msec     Systemic  VTI:  0.16 m MV E velocity: 139.00 cm/s  Systemic Diam: 1.80 cm Julien Nordmann MD Electronically signed by Julien Nordmann MD Signature Date/Time: 12/02/2021/5:44:29 PM    Final    DG Chest 2 View  Result Date: 12/02/2021 CLINICAL DATA:  Shortness of breath EXAM: CHEST - 2 VIEW COMPARISON:  None Available. FINDINGS: Transverse diameter of heart is increased.Central pulmonary vessels are prominent. Increased interstitial markings are seen in right lung and left lower lung field. There is blunting of lateral CP angles. There is no pneumothorax. IMPRESSION: Cardiomegaly. Central pulmonary vessels are prominent suggesting CHF. Increased interstitial markings are seen in both lungs, more so on the right side, possibly suggesting asymmetric pulmonary edema. Small bilateral pleural effusions. Electronically Signed   By: Ernie Avena M.D.   On: 12/02/2021 12:38   VAS [place] ABI WITH/WO TBI  Result Date: 11/28/2021  LOWER EXTREMITY DOPPLER STUDY Patient Name:  Kristopher Oneal  Date of Exam:   11/21/2021 Medical Rec #: [id number]    Accession #:    [id number] Date of Birth: 1951/01/18     Patient Gender: M Patient Age:   51 years Exam Location:  [facility] Procedure:      VAS [place] ABI WITH/WO TBI Referring Phys: Levora Dredge --------------------------------------------------------------------------------  Indications: Claudication.  Performing Technologist: Debbe Bales RVS  Examination Guidelines: A complete evaluation includes at minimum, Doppler waveform signals and systolic blood pressure reading at the level of bilateral brachial, anterior tibial, and posterior tibial arteries, when vessel segments are accessible. Bilateral testing is considered an integral part of a complete examination. Photoelectric Plethysmograph (PPG) waveforms and toe systolic pressure readings are included as required and additional duplex testing as needed. Limited examinations for reoccurring indications may be performed as  noted.  ABI Findings: +---------+------------------+-----+---------+--------+ Right    Rt Pressure (mmHg)IndexWaveform Comment  +---------+------------------+-----+---------+--------+ Brachial 154                                      +---------+------------------+-----+---------+--------+ ATA      176               1.14 triphasic         +---------+------------------+-----+---------+--------+ PTA      192               1.25 triphasic         +---------+------------------+-----+---------+--------+ Great Toe177               1.15 Normal            +---------+------------------+-----+---------+--------+ +---------+------------------+-----+---------+----------------------+ Left     Lt Pressure (mmHg)IndexWaveform Comment                +---------+------------------+-----+---------+----------------------+ Brachial  Glucose monitor on ARM +---------+------------------+-----+---------+----------------------+ ATA      160               1.04 triphasic                       +---------+------------------+-----+---------+----------------------+ PTA      190               1.23 triphasic                       +---------+------------------+-----+---------+----------------------+ Great Toe152               0.99 Normal                          +---------+------------------+-----+---------+----------------------+ +-------+-----------+-----------+------------+------------+ ABI/TBIToday's ABIToday's TBIPrevious ABIPrevious TBI +-------+-----------+-----------+------------+------------+ Right  1.25       1.15                                +-------+-----------+-----------+------------+------------+ Left   1.23       .99                                 +-------+-----------+-----------+------------+------------+  Summary: Right: Resting right ankle-brachial index is within normal range. No evidence of significant right lower  extremity arterial disease. The right toe-brachial index is normal. Left: Resting left ankle-brachial index is within normal range. No evidence of significant left lower extremity arterial disease. The left toe-brachial index is normal. *See table(s) above for measurements and observations.  Electronically signed by Levora DredgeGregory Schnier MD on 11/28/2021 at 4:13:33 PM.    Final     Microbiology: Recent Results (from the past 240 hour(s))  SARS Coronavirus 2 by RT PCR (hospital order, [facility]) *cepheid single result test* Anterior Nasal Swab     Status: None   Collection Time: 12/02/21 11:49 AM   Specimen: Anterior Nasal Swab  Result Value Ref Range Status   SARS Coronavirus 2 by RT PCR NEGATIVE NEGATIVE Final    Comment: (NOTE) SARS-CoV-2 target nucleic acids are NOT DETECTED.  The SARS-CoV-2 RNA is generally detectable in upper and lower respiratory specimens during the acute phase of infection. The lowest concentration of SARS-CoV-2 viral copies this assay can detect is 250 copies / mL. A negative result does not preclude SARS-CoV-2 infection and should not be used as the sole basis for treatment or other patient management decisions.  A negative result may occur with improper specimen collection / handling, submission of specimen other than nasopharyngeal swab, presence of viral mutation(s) within the areas targeted by this assay, and inadequate number of viral copies (<250 copies / mL). A negative result must be combined with clinical observations, patient history, and epidemiological information.  Fact Sheet for Patients:   RoadLapTop.co.zahttps://www.fda.gov/media/158405/download  Fact Sheet for Healthcare Providers: http://kim-miller.com/https://www.fda.gov/media/158404/download  This test is not yet approved or  cleared by the [place] FDA and has been authorized for detection and/or diagnosis of SARS-CoV-2 by FDA under an Emergency Use Authorization (EUA).  This EUA will remain in  effect (meaning this test can be used) for the duration of the COVID-19 declaration under Section 564(b)(1) of the Act, 21 U.S.C. section 360bbb-3(b)(1), unless the authorization is terminated or revoked sooner.  Performed at [facility], [address]  [place], [place] [address]      Labs: Basic Metabolic Panel: Recent Labs  Lab 12/02/21 1058 12/02/21 1349 12/03/21 0148 12/04/21 1141  NA 134*  --  137 135  K 4.1  --  3.2* 4.4  CL 102  --  100 99  CO2 21*  --  26 26  GLUCOSE 284*  --  201* 370*  BUN 19  --  19 27*  CREATININE 1.10  --  1.12 1.25*  CALCIUM 8.8*  --  8.7* 8.8*  MG  --  1.6* 2.2 2.2   Liver Function Tests: No results for input(s): "AST", "ALT", "ALKPHOS", "BILITOT", "PROT", "ALBUMIN" in the last 168 hours. No results for input(s): "LIPASE", "AMYLASE" in the last 168 hours. No results for input(s): "AMMONIA" in the last 168 hours. CBC: Recent Labs  Lab 12/02/21 1058 12/03/21 0148 12/04/21 0513  WBC 14.5* 13.0* 9.2  HGB 14.2 13.8 12.5*  HCT 43.1 41.3 39.2  MCV 96.6 94.7 96.8  PLT 226 213 212   Cardiac Enzymes: No results for input(s): "CKTOTAL", "CKMB", "CKMBINDEX", "TROPONINI" in the last 168 hours. BNP: BNP (last 3 results) Recent Labs    12/02/21 1058  BNP 241.3*    ProBNP (last 3 results) No results for input(s): "PROBNP" in the last 8760 hours.  CBG: Recent Labs  Lab 12/03/21 0744 12/03/21 1139 12/03/21 1621 12/03/21 2051 12/04/21 0823  GLUCAP 242* 342* 333* 325* 286*       Signed:  Silvano Bilis MD.  [facility] 12/04/2021, 12:36 PM

## 2021-12-04 NOTE — TOC CM/SW Note (Addendum)
Patient on 1 L acute oxygen. TOC following for potential home oxygen needs.  Charlynn Court, CSW [phone]  1:12 pm: Patient has orders to discharge home today. Chart reviewed. PCP is Bethann Punches, MD. Weaned to room air. No wounds. Pharmacist said Eliquis coupon and teaching has already been given. No TOC needs identified. CSW signing off.  Charlynn Court, CSW [phone]

## 2021-12-14 ENCOUNTER — Encounter (INDEPENDENT_AMBULATORY_CARE_PROVIDER_SITE_OTHER): Payer: Self-pay | Admitting: Nurse Practitioner

## 2021-12-14 NOTE — Progress Notes (Signed)
Subjective:    Patient ID: Kristopher Oneal, male    DOB: [birth year], 71 y.o.   MRN: [id number] Chief Complaint  Patient presents with   New Patient (Initial Visit)    Ref [name] consult claudication    Kristopher Oneal is a 71 year old male who presents today as a referral from his primary care provider Dr. [name] in regards to concern for claudication.  He notes that approximately week ago he was working outside and he bent over and when he tried to get up he began to have severe back pain with associated leg pain.  Following this incident he squatted and was sent able to get up without significant difficulty.  He notes that currently he can only walk about 40-50 steps before he begins to have significant leg weakness.  He has previously had both knees replaced.  The right and 2021 in the left in 2022.  He also notes that he has had back surgery in 1993 the L5-S1.  Today the patient underwent noninvasive studies which showed an ABI of 1.25 on the right and 1.23 on the left.  Has triphasic tibial artery waveforms with normal toe waveforms bilaterally.    Review of Systems  Musculoskeletal:  Positive for arthralgias and gait problem.  All other systems reviewed and are negative.      Objective:   Physical Exam Vitals reviewed.  HENT:     Head: Normocephalic.  Cardiovascular:     Rate and Rhythm: Normal rate.     Pulses:          Dorsalis pedis pulses are 1+ on the right side and 1+ on the left side.       Posterior tibial pulses are 1+ on the right side and 1+ on the left side.  Pulmonary:     Effort: Pulmonary effort is normal.  Skin:    General: Skin is warm and dry.  Neurological:     Mental Status: He is alert and oriented to person, place, and time.  Psychiatric:        Mood and Affect: Mood normal.        Behavior: Behavior normal.        Thought Content: Thought content normal.        Judgment: Judgment normal.     BP 126/72 (BP Location: Right Arm)   Pulse 94   Resp 16    Wt 279 lb (126.6 kg)   BMI 35.82 kg/m   Past Medical History:  Diagnosis Date   Anxiety    Arthritis    Diabetes mellitus without complication (HCC)    type 2   Dysrhythmia    PVC/ a fib   Heart murmur    High cholesterol    Hypertension     Social History   Socioeconomic History   Marital status: Married    Spouse name: Not on file   Number of children: Not on file   Years of education: Not on file   Highest education level: Not on file  Occupational History   Not on file  Tobacco Use   Smoking status: Former    Types: Cigarettes    Quit date: 1980    Years since quitting: 43.6   Smokeless tobacco: Never   Tobacco comments:    1980  Vaping Use   Vaping Use: Never used  Substance and Sexual Activity   Alcohol use: Yes    Comment: rarely   Drug use: Never   Sexual activity: Not  on file  Other Topics Concern   Not on file  Social History Narrative   Not on file   Social Determinants of Health   Financial Resource Strain: Not on file  Food Insecurity: Not on file  Transportation Needs: Not on file  Physical Activity: Not on file  Stress: Not on file  Social Connections: Not on file  Intimate Partner Violence: Not on file    Past Surgical History:  Procedure Laterality Date   BACK SURGERY     l5/s1   COLONOSCOPY     KNEE ARTHROPLASTY Right 01/02/2020   Procedure: COMPUTER ASSISTED TOTAL KNEE ARTHROPLASTY;  Surgeon: Dereck Leep, MD;  Location: [facility];  Service: Orthopedics;  Laterality: Right;   KNEE ARTHROPLASTY Left 11/19/2020   Procedure: COMPUTER ASSISTED TOTAL KNEE ARTHROPLASTY;  Surgeon: Dereck Leep, MD;  Location: [facility];  Service: Orthopedics;  Laterality: Left;   ORIF PATELLA Left 04/16/2021   Procedure: OPEN REDUCTION INTERNAL (ORIF) FIXATION PATELLA;  Surgeon: Dereck Leep, MD;  Location: [facility];  Service: Orthopedics;  Laterality: Left;   ORIF PATELLA Left 05/10/2021   Procedure: OPEN REDUCTION INTERNAL (ORIF) FIXATION  PATELLA;  Surgeon: Dereck Leep, MD;  Location: [facility];  Service: Orthopedics;  Laterality: Left;    Family History  Problem Relation Age of Onset   Dementia Mother    Heart attack Father    Emphysema Father    Heart attack Brother     No Known Allergies     Latest Ref Rng & Units 12/04/2021    5:13 AM 12/03/2021    1:48 AM 12/02/2021   10:58 AM  CBC  WBC 4.0 - 10.5 K/uL 9.2  13.0  14.5   Hemoglobin 13.0 - 17.0 g/dL 12.5  13.8  14.2   Hematocrit 39.0 - 52.0 % 39.2  41.3  43.1   Platelets 150 - 400 K/uL 212  213  226       CMP     Component Value Date/Time   NA 135 12/04/2021 1141   K 4.4 12/04/2021 1141   CL 99 12/04/2021 1141   CO2 26 12/04/2021 1141   GLUCOSE 370 (H) 12/04/2021 1141   BUN 27 (H) 12/04/2021 1141   CREATININE 1.25 (H) 12/04/2021 1141   CALCIUM 8.8 (L) 12/04/2021 1141   PROT 7.6 04/16/2021 0619   ALBUMIN 3.8 04/16/2021 0619   AST 25 04/16/2021 0619   ALT 33 04/16/2021 0619   ALKPHOS 49 04/16/2021 0619   BILITOT 0.4 04/16/2021 0619   GFRNONAA >60 12/04/2021 1141   GFRAA >60 12/27/2019 1108     VAS [place] ABI WITH/WO TBI  Result Date: 11/28/2021  LOWER EXTREMITY DOPPLER STUDY Patient Name:  Kristopher Oneal  Date of Exam:   11/21/2021 Medical Rec #: [id number]    Accession #:    [id number] Date of Birth: 11/19/1950     Patient Gender: M Patient Age:   21 years Exam Location:  [facility] Procedure:      VAS [place] ABI WITH/WO TBI Referring Phys: Hortencia Pilar --------------------------------------------------------------------------------  Indications: Claudication.  Performing Technologist: Almira Coaster RVS  Examination Guidelines: A complete evaluation includes at minimum, Doppler waveform signals and systolic blood pressure reading at the level of bilateral brachial, anterior tibial, and posterior tibial arteries, when vessel segments are accessible. Bilateral testing is considered an integral part of a complete examination. Photoelectric  Plethysmograph (PPG) waveforms and toe systolic pressure readings are included as required and additional duplex testing  as needed. Limited examinations for reoccurring indications may be performed as noted.  ABI Findings: +---------+------------------+-----+---------+--------+ Right    Rt Pressure (mmHg)IndexWaveform Comment  +---------+------------------+-----+---------+--------+ Brachial 154                                      +---------+------------------+-----+---------+--------+ ATA      176               1.14 triphasic         +---------+------------------+-----+---------+--------+ PTA      192               1.25 triphasic         +---------+------------------+-----+---------+--------+ Great Toe177               1.15 Normal            +---------+------------------+-----+---------+--------+ +---------+------------------+-----+---------+----------------------+ Left     Lt Pressure (mmHg)IndexWaveform Comment                +---------+------------------+-----+---------+----------------------+ Brachial                                 Glucose monitor on ARM +---------+------------------+-----+---------+----------------------+ ATA      160               1.04 triphasic                       +---------+------------------+-----+---------+----------------------+ PTA      190               1.23 triphasic                       +---------+------------------+-----+---------+----------------------+ Great Toe152               0.99 Normal                          +---------+------------------+-----+---------+----------------------+ +-------+-----------+-----------+------------+------------+ ABI/TBIToday's ABIToday's TBIPrevious ABIPrevious TBI +-------+-----------+-----------+------------+------------+ Right  1.25       1.15                                +-------+-----------+-----------+------------+------------+ Left   1.23       .99                                  +-------+-----------+-----------+------------+------------+  Summary: Right: Resting right ankle-brachial index is within normal range. No evidence of significant right lower extremity arterial disease. The right toe-brachial index is normal. Left: Resting left ankle-brachial index is within normal range. No evidence of significant left lower extremity arterial disease. The left toe-brachial index is normal. *See table(s) above for measurements and observations.  Electronically signed by Hortencia Pilar MD on 11/28/2021 at 4:13:33 PM.    Final        Assessment & Plan:   1. Claudication [facility]) Based on the patient's noninvasive studies the claudication is likely not related to peripheral arterial disease however his symptoms are concerning for peripheral arterial disease versus a musculoskeletal component.  We will have the patient return at his convenience for an aortoiliac duplex to evaluate iliac arteries as there are some instances where patients will have normal ABIs  but still have high-grade stenosis of the iliac arteries which would present the symptoms that the patient currently has.  If the studies are normal then the pain is indeed from musculoskeletal issues and evaluation of his lumbar spine would be indicated.  2. Primary hypertension Continue antihypertensive medications as already ordered, these medications have been reviewed and there are no changes at this time.   3. Mixed hyperlipidemia Continue statin as ordered and reviewed, no changes at this time   4. DM type 2 with diabetic mixed hyperlipidemia (HCC) Continue hypoglycemic medications as already ordered, these medications have been reviewed and there are no changes at this time.  Hgb A1C to be monitored as already arranged by primary service    Current Outpatient Medications on File Prior to Visit  Medication Sig Dispense Refill   Ascorbic Acid (VITAMIN C) 1000 MG tablet Take 1,000 mg by mouth daily.  (Patient not taking: Reported on 12/02/2021)     diltiazem (CARDIZEM CD) 240 MG 24 hr capsule Take 240 mg by mouth every evening.     fenofibrate 160 MG tablet Take 160 mg by mouth daily.      fluticasone (FLONASE) 50 MCG/ACT nasal spray Place 1 spray into the nose daily as needed for allergies.     gabapentin (NEURONTIN) 300 MG capsule Take 900 mg by mouth at bedtime.      glipiZIDE (GLUCOTROL) 5 MG tablet Take 5 mg by mouth 2 (two) times daily.     insulin NPH-regular Human (HUMULIN 70/30) (70-30) 100 UNIT/ML injection Inject 25-50 Units into the skin See admin instructions. Inject 50 units into the skin in the morning and 25 units in the evening     lovastatin (MEVACOR) 40 MG tablet Take 40 mg by mouth daily with supper.      metFORMIN (GLUCOPHAGE) 1000 MG tablet Take 1,000 mg by mouth 2 (two) times daily with a meal.      metoprolol succinate (TOPROL-XL) 100 MG 24 hr tablet Take 100 mg by mouth at bedtime.     Multiple Vitamins-Minerals (ADVANCED DIABETIC MULTIVITAMIN) TABS Take 1 tablet by mouth daily.     triamterene-hydrochlorothiazide (MAXZIDE-25) 37.5-25 MG tablet Take 1 tablet by mouth daily.     venlafaxine XR (EFFEXOR-XR) 37.5 MG 24 hr capsule Take 37.5 mg by mouth daily with breakfast.     vitamin B-12 (CYANOCOBALAMIN) 500 MCG tablet Take 1,000 mcg by mouth daily. (Patient not taking: Reported on 12/02/2021)     No current facility-administered medications on file prior to visit.    There are no Patient Instructions on file for this visit. No follow-ups on file.   Georgiana Spinner, NP

## 2021-12-23 NOTE — Progress Notes (Signed)
Cardiology Office Note  Date:  12/24/2021   ID:  Kristopher Oneal, DOB Apr 30, 1950, MRN [id number]  PCP:  Danella Penton, MD   Chief Complaint  Patient presents with   [facility] follow up     "Doing well." Medications reviewed by the patient verbally.     HPI:  Kristopher Oneal is a 71 year old gentleman with  Here obesity,  hypertension, hyperlipidemia,  diabetes type 2,  permanent atrial fibrillation documented back to 2020 presenting to the hospital July 2023 with worsening shortness of breath, atrial fibrillation with RVR, diastolic CHF atrial fibrillation, likely now permanent, noted dating back to 2020 on EKG He presents for office follow-up of his atrial fibrillation and diastolic CHF  Discussed recent hospital admission for acute on chronic diastolic CHF in the setting of atrial fibrillation with RVR D/c from hospital 12/04/21  Diltiazem ER for dose recently increased by Dr. [name] up to 300 mg daily  Reports he is sedentary at baseline, significant leg weakness, trying to do more walking Feels he is tolerating the diltiazem ER 300 and metoprolol succinate 100 daily Today brings in patient assistance forms for Eliquis  Denies significant lower extremity swelling Reports he has cut back on his water, takes Lasix 20 daily Recent lab work reviewed from December 10, 2021 creatinine up to 1.5  Lab work reviewed Elevated A1c 8.7   EKG personally reviewed by myself on todays visit Atrial fibrillation with rate 108 bpm no significant ST-T wave changes  PMH:   has a past medical history of Anxiety, Arthritis, Diabetes mellitus without complication (HCC), Dysrhythmia, Heart murmur, High cholesterol, and Hypertension.  PSH:    Past Surgical History:  Procedure Laterality Date   BACK SURGERY     l5/s1   COLONOSCOPY     KNEE ARTHROPLASTY Right 01/02/2020   Procedure: COMPUTER ASSISTED TOTAL KNEE ARTHROPLASTY;  Surgeon: Donato Heinz, MD;  Location: [facility];  Service: Orthopedics;   Laterality: Right;   KNEE ARTHROPLASTY Left 11/19/2020   Procedure: COMPUTER ASSISTED TOTAL KNEE ARTHROPLASTY;  Surgeon: Donato Heinz, MD;  Location: [facility];  Service: Orthopedics;  Laterality: Left;   ORIF PATELLA Left 04/16/2021   Procedure: OPEN REDUCTION INTERNAL (ORIF) FIXATION PATELLA;  Surgeon: Donato Heinz, MD;  Location: [facility];  Service: Orthopedics;  Laterality: Left;   ORIF PATELLA Left 05/10/2021   Procedure: OPEN REDUCTION INTERNAL (ORIF) FIXATION PATELLA;  Surgeon: Donato Heinz, MD;  Location: [facility];  Service: Orthopedics;  Laterality: Left;    Current Outpatient Medications  Medication Sig Dispense Refill   apixaban (ELIQUIS) 5 MG TABS tablet Take 1 tablet (5 mg total) by mouth 2 (two) times daily. 60 tablet 1   diltiazem (TIAZAC) 300 MG 24 hr capsule Take 300 mg by mouth daily.     fenofibrate 160 MG tablet Take 160 mg by mouth daily.      fluticasone (FLONASE) 50 MCG/ACT nasal spray Place 1 spray into the nose daily as needed for allergies.     furosemide (LASIX) 40 MG tablet Take 0.5 tablets (20 mg total) by mouth daily. 30 tablet 1   glipiZIDE (GLUCOTROL) 5 MG tablet Take 5 mg by mouth 2 (two) times daily.     insulin NPH-regular Human (HUMULIN 70/30) (70-30) 100 UNIT/ML injection Inject 25-50 Units into the skin See admin instructions. Inject 50 units into the skin in the morning and 25 units in the evening     lovastatin (MEVACOR) 40 MG tablet Take 40 mg by mouth  daily with supper.      metFORMIN (GLUCOPHAGE) 1000 MG tablet Take 1,000 mg by mouth 2 (two) times daily with a meal.      metoprolol succinate (TOPROL-XL) 100 MG 24 hr tablet Take 100 mg by mouth at bedtime.     Multiple Vitamins-Minerals (ADVANCED DIABETIC MULTIVITAMIN) TABS Take 1 tablet by mouth daily.     triamterene-hydrochlorothiazide (MAXZIDE-25) 37.5-25 MG tablet Take 1 tablet by mouth daily.     venlafaxine XR (EFFEXOR-XR) 37.5 MG 24 hr capsule Take 37.5 mg by mouth daily with breakfast.      vitamin B-12 (CYANOCOBALAMIN) 500 MCG tablet Take 1,000 mcg by mouth daily.     Ascorbic Acid (VITAMIN C) 1000 MG tablet Take 1,000 mg by mouth daily. (Patient not taking: Reported on 12/02/2021)     gabapentin (NEURONTIN) 300 MG capsule Take 900 mg by mouth at bedtime.  (Patient not taking: Reported on 12/24/2021)     No current facility-administered medications for this visit.    Allergies:   Patient has no known allergies.   Social History:  The patient  reports that he quit smoking about 43 years ago. His smoking use included cigarettes. He has never used smokeless tobacco. He reports current alcohol use. He reports that he does not use drugs.   Family History:   family history includes Dementia in his mother; Emphysema in his father; Heart attack in his brother and father.    Review of Systems: Review of Systems  Constitutional: Negative.   HENT: Negative.    Respiratory: Negative.    Cardiovascular: Negative.   Gastrointestinal: Negative.   Musculoskeletal: Negative.   Neurological: Negative.   Psychiatric/Behavioral: Negative.    All other systems reviewed and are negative.   PHYSICAL EXAM: VS:  BP (!) 140/80 (BP Location: Right Arm, Patient Position: Sitting, Cuff Size: Normal)   Pulse (!) 108   Ht 6\' 2"  (1.88 m)   Wt 269 lb 2 oz (122.1 kg)   SpO2 96%   BMI 34.55 kg/m  , BMI Body mass index is 34.55 kg/m. Constitutional:  oriented to person, place, and time. No distress.  HENT:  Head: Grossly normal Eyes:  no discharge. No scleral icterus.  Neck: No JVD, no carotid bruits  Cardiovascular: Irregularly irregular, no murmurs appreciated Pulmonary/Chest: Clear to auscultation bilaterally, no wheezes or rails Abdominal: Soft.  no distension.  no tenderness.  Musculoskeletal: Normal range of motion Neurological:  normal muscle tone. Coordination normal. No atrophy Skin: Skin warm and dry Psychiatric: normal affect, pleasant  Recent Labs: 04/16/2021: ALT  33 12/02/2021: B Natriuretic Peptide 241.3 12/04/2021: BUN 27; Creatinine, Ser 1.25; Hemoglobin 12.5; Magnesium 2.2; Platelets 212; Potassium 4.4; Sodium 135    Lipid Panel No results found for: "CHOL", "HDL", "LDLCALC", "TRIG"    Wt Readings from Last 3 Encounters:  12/24/21 269 lb 2 oz (122.1 kg)  12/03/21 276 lb 0.3 oz (125.2 kg)  11/21/21 279 lb (126.6 kg)      ASSESSMENT AND PLAN:  Problem List Items Addressed This Visit       Cardiology Problems   Atrial fibrillation and flutter (HCC) - Primary   Relevant Medications   diltiazem (TIAZAC) 300 MG 24 hr capsule   DM type 2 with diabetic mixed hyperlipidemia (HCC)   Relevant Medications   diltiazem (TIAZAC) 300 MG 24 hr capsule   Benign essential hypertension   Relevant Medications   diltiazem (TIAZAC) 300 MG 24 hr capsule   Hyperlipidemia, mixed   Relevant Medications  diltiazem (TIAZAC) 300 MG 24 hr capsule   Other Visit Diagnoses     Chronic diastolic CHF (congestive heart failure) (HCC)       Relevant Medications   diltiazem (TIAZAC) 300 MG 24 hr capsule      Atrial fibrillation, permanent Dating back to 2020 per EKGs Recommend he continue diltiazem ER 300 mg daily and metoprolol succinate 100 daily, Eliquis 5 twice daily  Chronic diastolic CHF Recent hospitalization for diastolic CHF exacerbation in the setting of high fluid and salt intake He has cut back on his fluid intake, appears relatively euvolemic on Lasix 20 daily  Diabetes type 2 Recent A1c elevated, managed by primary care We have encouraged continued exercise, careful diet management in an effort to lose weight.     Total encounter time more than 30 minutes  Greater than 50% was spent in counseling and coordination of care with the patient    Signed, Dossie Arbour, M.D., Ph.D. [facility] [place], [place] [phone]

## 2021-12-24 ENCOUNTER — Telehealth: Payer: Self-pay | Admitting: Cardiovascular Disease

## 2021-12-24 ENCOUNTER — Encounter: Payer: Self-pay | Admitting: Cardiovascular Disease

## 2021-12-24 ENCOUNTER — Ambulatory Visit: Payer: Medicare Other | Attending: Cardiovascular Disease | Admitting: Cardiovascular Disease

## 2021-12-24 VITALS — BP 140/80 | HR 108 | Ht 74.0 in | Wt 269.1 lb

## 2021-12-24 DIAGNOSIS — E1169 Type 2 diabetes mellitus with other specified complication: Secondary | ICD-10-CM

## 2021-12-24 DIAGNOSIS — I4892 Unspecified atrial flutter: Secondary | ICD-10-CM | POA: Diagnosis not present

## 2021-12-24 DIAGNOSIS — E782 Mixed hyperlipidemia: Secondary | ICD-10-CM | POA: Diagnosis not present

## 2021-12-24 DIAGNOSIS — I4891 Unspecified atrial fibrillation: Secondary | ICD-10-CM | POA: Diagnosis not present

## 2021-12-24 DIAGNOSIS — I10 Essential (primary) hypertension: Secondary | ICD-10-CM

## 2021-12-24 DIAGNOSIS — I5032 Chronic diastolic (congestive) heart failure: Secondary | ICD-10-CM

## 2021-12-24 NOTE — Telephone Encounter (Signed)
Patient here today for office visit. He brought in application for Eliquis assistance. We discussed need for income and out of pocket expense report. Both him and wife verbalized understanding and will bring that information by our office so that we may complete and send to company. No further needs at this time.

## 2021-12-24 NOTE — Patient Instructions (Signed)
Medication Instructions:  No changes  If you need a refill on your cardiac medications before your next appointment, please call your pharmacy.    Lab work: No new labs needed   Testing/Procedures: No new testing needed   Follow-Up: At [facility], you and your health needs are our priority.  As part of our continuing mission to provide you with exceptional heart care, we have created designated Provider Care Teams.  These Care Teams include your primary Cardiologist (physician) and Advanced Practice Providers (APPs -  Physician Assistants and Nurse Practitioners) who all work together to provide you with the care you need, when you need it.  You will need a follow up appointment in 6 months  Providers on your designated Care Team:   Christopher Berge, NP Ryan Dunn, PA-C Cadence Furth, PA-C  COVID-19 Vaccine Information can be found at: [email or web address] For questions related to vaccine distribution or appointments, please email [email or web address] or call [phone].   

## 2021-12-26 NOTE — Progress Notes (Signed)
Kristopher Oneal ID: Kristopher Oneal, male    DOB: 09-26-1950, 71 y.o.   MRN: [id number]  HPI  Kristopher Oneal is a 71 y/o male with a history of DM, hyperlipidemia, HTN, anxiety, atrial fibrillation, previous tobacco use and chronic heart failure.   Echo report from 12/02/21 reviewed and showed an EF of 60-65% along with mild LVH  Admitted 12/02/21 due to SOB and leg edema due to AF RVR. Placed on oxygen at 2L. Initially given IV lasix with transition to oral diuretics. Cardiology consult obtained. Weaned off oxygen. Discharged after 2 days.   Kristopher Oneal presents today for Kristopher Oneal initial visit with a chief complaint of minimal fatigue with moderate exertion. Describes this as chronic in nature. Kristopher Oneal has associated cough, shortness of breath, chest pain, pedal edema, palpitations, abdominal distention, dizziness, difficulty sleeping or weight gain.   Says that Kristopher Oneal walks Kristopher Oneal dogs ~150 yards in the [place] that they live in but says that Kristopher Oneal knees really hurt because Kristopher Oneal's walking on gravel. Kristopher Oneal also admits that Kristopher Oneal's anxious about how much exertion Kristopher Oneal should be doing.   Past Medical History:  Diagnosis Date   Anxiety    Arrhythmia    atrial fibrillation   Arthritis    CHF (congestive heart failure) (HCC)    Diabetes mellitus without complication (HCC)    type 2   Dysrhythmia    PVC/ a fib   Heart murmur    High cholesterol    Hypertension    Past Surgical History:  Procedure Laterality Date   BACK SURGERY     l5/s1   COLONOSCOPY     KNEE ARTHROPLASTY Right 01/02/2020   Procedure: COMPUTER ASSISTED TOTAL KNEE ARTHROPLASTY;  Surgeon: Donato Heinz, MD;  Location: [facility];  Service: Orthopedics;  Laterality: Right;   KNEE ARTHROPLASTY Left 11/19/2020   Procedure: COMPUTER ASSISTED TOTAL KNEE ARTHROPLASTY;  Surgeon: Donato Heinz, MD;  Location: [facility];  Service: Orthopedics;  Laterality: Left;   ORIF PATELLA Left 04/16/2021   Procedure: OPEN REDUCTION INTERNAL (ORIF) FIXATION PATELLA;  Surgeon: Donato Heinz, MD;   Location: [facility];  Service: Orthopedics;  Laterality: Left;   ORIF PATELLA Left 05/10/2021   Procedure: OPEN REDUCTION INTERNAL (ORIF) FIXATION PATELLA;  Surgeon: Donato Heinz, MD;  Location: [facility];  Service: Orthopedics;  Laterality: Left;   Family History  Problem Relation Age of Onset   Dementia Mother    Heart attack Father    Emphysema Father    Heart attack Brother    Social History   Tobacco Use   Smoking status: Former    Types: Cigarettes    Quit date: 1980    Years since quitting: 43.7   Smokeless tobacco: Never   Tobacco comments:    1980  Substance Use Topics   Alcohol use: Yes    Comment: rarely   No Known Allergies Prior to Admission medications   Medication Sig Start Date End Date Taking? Authorizing Provider  apixaban (ELIQUIS) 5 MG TABS tablet Take 1 tablet (5 mg total) by mouth 2 (two) times daily. 12/04/21  Yes Wouk, Wilfred Curtis, MD  diltiazem (CARDIZEM CD) 300 MG 24 hr capsule Take 300 mg by mouth at bedtime. 12/10/21  Yes [provider]  fenofibrate 160 MG tablet Take 160 mg by mouth daily.  05/27/18  Yes [provider]  fluticasone (FLONASE) 50 MCG/ACT nasal spray Place 1 spray into the nose daily as needed for allergies.   Yes [provider]  furosemide (LASIX) 40 MG tablet Take 0.5 tablets (20 mg total) by mouth daily. 12/05/21  Yes Wouk, Wilfred Curtis, MD  glipiZIDE (GLUCOTROL) 5 MG tablet Take 5 mg by mouth 2 (two) times daily.   Yes [provider]  insulin NPH-regular Human (HUMULIN 70/30) (70-30) 100 UNIT/ML injection Inject 25-50 Units into the skin See admin instructions. Inject 50 units into the skin in the morning and 25 units in the evening   Yes [provider]  lovastatin (MEVACOR) 40 MG tablet Take 40 mg by mouth daily with supper.    Yes [provider]  metFORMIN (GLUCOPHAGE) 1000 MG tablet Take 1,000 mg by mouth 2 (two) times daily with a meal.  05/27/18  Yes [provider]   metoprolol succinate (TOPROL-XL) 100 MG 24 hr tablet Take 100 mg by mouth at bedtime. 09/26/18  Yes [provider]  Multiple Vitamins-Minerals (ADVANCED DIABETIC MULTIVITAMIN) TABS Take 1 tablet by mouth daily.   Yes [provider]  triamterene-hydrochlorothiazide (MAXZIDE-25) 37.5-25 MG tablet Take 1 tablet by mouth daily.   Yes [provider]  venlafaxine XR (EFFEXOR-XR) 37.5 MG 24 hr capsule Take 37.5 mg by mouth daily with breakfast.   Yes [provider]  vitamin B-12 (CYANOCOBALAMIN) 500 MCG tablet Take 1,000 mcg by mouth daily.   Yes [provider]   Review of Systems  Constitutional:  Positive for fatigue (with moderate exertion). Negative for appetite change.  HENT:  Negative for congestion, postnasal drip and sore throat.   Eyes: Negative.   Respiratory:  Negative for cough, chest tightness and shortness of breath.   Cardiovascular:  Negative for chest pain, palpitations and leg swelling.  Gastrointestinal:  Negative for abdominal distention and abdominal pain.  Endocrine: Negative.   Genitourinary: Negative.   Musculoskeletal:  Positive for arthralgias (knee pain).  Skin: Negative.   Allergic/Immunologic: Negative.   Neurological:  Negative for dizziness and light-headedness.  Hematological:  Negative for adenopathy. Does not bruise/bleed easily.  Psychiatric/Behavioral:  Negative for dysphoric mood and sleep disturbance (sleeping on 1 pillow). The Kristopher Oneal is not nervous/anxious.     Vitals:   12/27/21 0825  BP: 112/65  Pulse: 75  Resp: 14  SpO2: 98%  Weight: 270 lb 2 oz (122.5 kg)  Height: 6\' 2"  (1.88 m)   Wt Readings from Last 3 Encounters:  12/27/21 270 lb 2 oz (122.5 kg)  12/24/21 269 lb 2 oz (122.1 kg)  12/03/21 276 lb 0.3 oz (125.2 kg)   Lab Results  Component Value Date   CREATININE 1.25 (H) 12/04/2021   CREATININE 1.12 12/03/2021   CREATININE 1.10 12/02/2021   Physical Exam Vitals and nursing note  reviewed. Exam conducted with a chaperone present (Kristopher Oneal).  Constitutional:      Appearance: Normal appearance.  HENT:     Head: Normocephalic and atraumatic.  Cardiovascular:     Rate and Rhythm: Normal rate. Rhythm irregular.  Pulmonary:     Effort: Pulmonary effort is normal. No respiratory distress.     Breath sounds: No wheezing or rales.  Abdominal:     General: There is no distension.     Palpations: Abdomen is soft.  Musculoskeletal:        General: No tenderness.     Cervical back: Neck supple. No tenderness.     Right lower leg: No edema.     Left lower leg: No edema.  Skin:    General: Skin is warm and dry.  Neurological:  General: No focal deficit present.     Mental Status: Kristopher Oneal is alert and oriented to person, place, and time.  Psychiatric:        Mood and Affect: Mood normal.        Behavior: Behavior normal.        Thought Content: Thought content normal.    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)- - NYHA class II - euvolemic today - weighing daily; reminded to call for an overnight weight gain of > 2 pounds or a weekly weight gain of > 5 pounds - not adding salt and Kristopher Oneal says that she uses Mrs Sharilyn Sites, [place] and other seasonings to cook with - discussed pulmonary rehab and Kristopher Oneal is interested as long as it isn't cost prohibitive; will make referral and explained that someone from that department will call him to discuss insurance coverage - BNP 12/02/21 was 241.3  2: HTN- - BP looks good (112/65) - saw PCP [name]) 12/10/21 - BMP 12/10/21 reviewed and showed sodium 136, potassium 4.8, creatinine 1.5 and GFR 46  3: Type 2 DM- - saw endocrinology Gershon Crane) 12/09/21 - home glucose this morning was 106 - A1c 10/14/21 was 8.7%  4: Atrial fibrillation- - saw cardiology Mariah Milling) 12/24/21 - currently on apixaban, diltiazem and metoprolol   Medication list reviewed.   Due to HF stability, will  not make a return appointment at this time. Advised Kristopher Oneal and Kristopher Oneal Kristopher Oneal that they could call back at anytime for any questions or to make another appointment. They were both comfortable with this plan.

## 2021-12-27 ENCOUNTER — Encounter: Payer: Self-pay | Admitting: Family

## 2021-12-27 ENCOUNTER — Ambulatory Visit: Payer: Medicare Other | Attending: Family | Admitting: Family

## 2021-12-27 VITALS — BP 112/65 | HR 75 | Resp 14 | Ht 74.0 in | Wt 270.1 lb

## 2021-12-27 DIAGNOSIS — E782 Mixed hyperlipidemia: Secondary | ICD-10-CM

## 2021-12-27 DIAGNOSIS — R140 Abdominal distension (gaseous): Secondary | ICD-10-CM | POA: Diagnosis not present

## 2021-12-27 DIAGNOSIS — R002 Palpitations: Secondary | ICD-10-CM | POA: Diagnosis not present

## 2021-12-27 DIAGNOSIS — G479 Sleep disorder, unspecified: Secondary | ICD-10-CM | POA: Diagnosis not present

## 2021-12-27 DIAGNOSIS — R5383 Other fatigue: Secondary | ICD-10-CM | POA: Insufficient documentation

## 2021-12-27 DIAGNOSIS — I4892 Unspecified atrial flutter: Secondary | ICD-10-CM

## 2021-12-27 DIAGNOSIS — I5032 Chronic diastolic (congestive) heart failure: Secondary | ICD-10-CM | POA: Diagnosis not present

## 2021-12-27 DIAGNOSIS — I10 Essential (primary) hypertension: Secondary | ICD-10-CM | POA: Diagnosis not present

## 2021-12-27 DIAGNOSIS — I4891 Unspecified atrial fibrillation: Secondary | ICD-10-CM

## 2021-12-27 DIAGNOSIS — F419 Anxiety disorder, unspecified: Secondary | ICD-10-CM | POA: Insufficient documentation

## 2021-12-27 DIAGNOSIS — Z7901 Long term (current) use of anticoagulants: Secondary | ICD-10-CM | POA: Insufficient documentation

## 2021-12-27 DIAGNOSIS — I110 Hypertensive heart disease with heart failure: Secondary | ICD-10-CM | POA: Insufficient documentation

## 2021-12-27 DIAGNOSIS — E119 Type 2 diabetes mellitus without complications: Secondary | ICD-10-CM | POA: Diagnosis not present

## 2021-12-27 DIAGNOSIS — E1169 Type 2 diabetes mellitus with other specified complication: Secondary | ICD-10-CM

## 2021-12-27 NOTE — Patient Instructions (Addendum)
Continue weighing daily and call for an overnight weight gain of 3 pounds or more or a weekly weight gain of more than 5 pounds.   If you have voicemail, please make sure your mailbox is cleaned out so that we may leave a message and please make sure to listen to any voicemails.    If you receive a satisfaction survey regarding the [facility], please take the time to fill it out. This way we can continue to provide excellent care and make any changes that need to be made.    Call us in the future if you need us for anything 

## 2021-12-31 ENCOUNTER — Other Ambulatory Visit (INDEPENDENT_AMBULATORY_CARE_PROVIDER_SITE_OTHER): Payer: Self-pay | Admitting: Nurse Practitioner

## 2021-12-31 ENCOUNTER — Other Ambulatory Visit: Payer: Self-pay | Admitting: *Deleted

## 2021-12-31 DIAGNOSIS — I739 Peripheral vascular disease, unspecified: Secondary | ICD-10-CM

## 2021-12-31 DIAGNOSIS — I5032 Chronic diastolic (congestive) heart failure: Secondary | ICD-10-CM

## 2022-01-01 ENCOUNTER — Ambulatory Visit (INDEPENDENT_AMBULATORY_CARE_PROVIDER_SITE_OTHER): Payer: Medicare Other

## 2022-01-01 ENCOUNTER — Ambulatory Visit (INDEPENDENT_AMBULATORY_CARE_PROVIDER_SITE_OTHER): Payer: Medicare Other | Admitting: Nurse Practitioner

## 2022-01-01 DIAGNOSIS — I739 Peripheral vascular disease, unspecified: Secondary | ICD-10-CM | POA: Diagnosis not present

## 2022-01-06 ENCOUNTER — Ambulatory Visit (INDEPENDENT_AMBULATORY_CARE_PROVIDER_SITE_OTHER): Payer: Medicare Other | Admitting: Nurse Practitioner

## 2022-01-06 ENCOUNTER — Encounter (INDEPENDENT_AMBULATORY_CARE_PROVIDER_SITE_OTHER): Payer: Self-pay | Admitting: Nurse Practitioner

## 2022-01-06 VITALS — BP 115/73 | HR 76 | Resp 16 | Wt 272.8 lb

## 2022-01-06 DIAGNOSIS — I739 Peripheral vascular disease, unspecified: Secondary | ICD-10-CM

## 2022-01-06 DIAGNOSIS — E782 Mixed hyperlipidemia: Secondary | ICD-10-CM | POA: Diagnosis not present

## 2022-01-06 DIAGNOSIS — I10 Essential (primary) hypertension: Secondary | ICD-10-CM

## 2022-01-06 DIAGNOSIS — E1169 Type 2 diabetes mellitus with other specified complication: Secondary | ICD-10-CM

## 2022-01-06 NOTE — Progress Notes (Signed)
Subjective:    Patient ID: Kristopher Oneal, male    DOB: Aug 19, 1950, 71 y.o.   MRN: [id number] Chief Complaint  Patient presents with   Follow-up    Ultrasound results    Kristopher Oneal is a 71 year old male who presents today as a referral from his primary care provider Dr. Sabra Heck in regards to concern for claudication.  He notes that approximately week ago he was working outside and he bent over and when he tried to get up he began to have severe back pain with associated leg pain.  Following this incident he squatted and was sent able to get up without significant difficulty.  He notes that currently he can only walk about 40-50 steps before he begins to have significant leg weakness.  He has previously had both knees replaced.  The right and 2021 in the left in 2022.  He also notes that he has had back surgery in 1993 the L5-S1.  He continues to have claudication symptoms with walking.    Previously the patient underwent noninvasive studies which showed an ABI of 1.25 on the right and 1.23 on the left.  Has triphasic tibial artery waveforms with normal toe waveforms bilaterally.  He recently underwent noninvasive studies of his aortic iliac which shows no evidence of an abdominal aortic aneurysm.  The patient also has noted triphasic waveforms throughout the iliac arteries no hemodynamically significant stenosis noted.  There is calcified plaque noted but it does not appear to be significant for ultrasound perspective.  The patient also underwent bilateral lower extremity arterial duplexes which showed triphasic waveforms throughout with no evidence of hemodynamically significant stenosis bilaterally but evidence of calcified plaque was noted.    Review of Systems  Musculoskeletal:  Positive for back pain.  All other systems reviewed and are negative.      Objective:   Physical Exam Vitals reviewed.  HENT:     Head: Normocephalic.  Cardiovascular:     Rate and Rhythm: Normal rate.      Pulses: Normal pulses.  Pulmonary:     Effort: Pulmonary effort is normal.  Skin:    General: Skin is warm and dry.  Neurological:     Mental Status: He is alert and oriented to person, place, and time.  Psychiatric:        Mood and Affect: Mood normal.        Behavior: Behavior normal.        Thought Content: Thought content normal.        Judgment: Judgment normal.     BP 115/73 (BP Location: Left Arm)   Pulse 76   Resp 16   Wt 272 lb 12.8 oz (123.7 kg)   BMI 35.03 kg/m   Past Medical History:  Diagnosis Date   Anxiety    Arrhythmia    atrial fibrillation   Arthritis    CHF (congestive heart failure) (HCC)    Diabetes mellitus without complication (HCC)    type 2   Dysrhythmia    PVC/ a fib   Heart murmur    High cholesterol    Hypertension     Social History   Socioeconomic History   Marital status: Married    Spouse name: Not on file   Number of children: Not on file   Years of education: Not on file   Highest education level: Not on file  Occupational History   Not on file  Tobacco Use   Smoking status: Former  Types: Cigarettes    Quit date: 1980    Years since quitting: 43.7   Smokeless tobacco: Never   Tobacco comments:    1980  Vaping Use   Vaping Use: Never used  Substance and Sexual Activity   Alcohol use: Yes    Comment: rarely   Drug use: Never   Sexual activity: Not on file  Other Topics Concern   Not on file  Social History Narrative   Not on file   Social Determinants of Health   Financial Resource Strain: Not on file  Food Insecurity: Not on file  Transportation Needs: Not on file  Physical Activity: Not on file  Stress: Not on file  Social Connections: Not on file  Intimate Partner Violence: Not on file    Past Surgical History:  Procedure Laterality Date   BACK SURGERY     l5/s1   COLONOSCOPY     KNEE ARTHROPLASTY Right 01/02/2020   Procedure: COMPUTER ASSISTED TOTAL KNEE ARTHROPLASTY;  Surgeon: Dereck Leep,  MD;  Location: [facility];  Service: Orthopedics;  Laterality: Right;   KNEE ARTHROPLASTY Left 11/19/2020   Procedure: COMPUTER ASSISTED TOTAL KNEE ARTHROPLASTY;  Surgeon: Dereck Leep, MD;  Location: [facility];  Service: Orthopedics;  Laterality: Left;   ORIF PATELLA Left 04/16/2021   Procedure: OPEN REDUCTION INTERNAL (ORIF) FIXATION PATELLA;  Surgeon: Dereck Leep, MD;  Location: [facility];  Service: Orthopedics;  Laterality: Left;   ORIF PATELLA Left 05/10/2021   Procedure: OPEN REDUCTION INTERNAL (ORIF) FIXATION PATELLA;  Surgeon: Dereck Leep, MD;  Location: [facility];  Service: Orthopedics;  Laterality: Left;    Family History  Problem Relation Age of Onset   Dementia Mother    Heart attack Father    Emphysema Father    Heart attack Brother     No Known Allergies     Latest Ref Rng & Units 12/04/2021    5:13 AM 12/03/2021    1:48 AM 12/02/2021   10:58 AM  CBC  WBC 4.0 - 10.5 K/uL 9.2  13.0  14.5   Hemoglobin 13.0 - 17.0 g/dL 12.5  13.8  14.2   Hematocrit 39.0 - 52.0 % 39.2  41.3  43.1   Platelets 150 - 400 K/uL 212  213  226       CMP     Component Value Date/Time   NA 135 12/04/2021 1141   K 4.4 12/04/2021 1141   CL 99 12/04/2021 1141   CO2 26 12/04/2021 1141   GLUCOSE 370 (H) 12/04/2021 1141   BUN 27 (H) 12/04/2021 1141   CREATININE 1.25 (H) 12/04/2021 1141   CALCIUM 8.8 (L) 12/04/2021 1141   PROT 7.6 04/16/2021 0619   ALBUMIN 3.8 04/16/2021 0619   AST 25 04/16/2021 0619   ALT 33 04/16/2021 0619   ALKPHOS 49 04/16/2021 0619   BILITOT 0.4 04/16/2021 0619   GFRNONAA >60 12/04/2021 1141   GFRAA >60 12/27/2019 1108     VAS [place] ABI WITH/WO TBI  Result Date: 11/28/2021  LOWER EXTREMITY DOPPLER STUDY Patient Name:  Kristopher Oneal  Date of Exam:   11/21/2021 Medical Rec #: [id number]    Accession #:    [id number] Date of Birth: Jan 29, 1951     Patient Gender: M Patient Age:   55 years Exam Location:  [facility] Procedure:      VAS [place] ABI WITH/WO TBI  Referring Phys: Hortencia Pilar --------------------------------------------------------------------------------  Indications: Claudication.  Performing Technologist: Almira Coaster RVS  Examination Guidelines: A complete evaluation includes at minimum, Doppler waveform signals and systolic blood pressure reading at the level of bilateral brachial, anterior tibial, and posterior tibial arteries, when vessel segments are accessible. Bilateral testing is considered an integral part of a complete examination. Photoelectric Plethysmograph (PPG) waveforms and toe systolic pressure readings are included as required and additional duplex testing as needed. Limited examinations for reoccurring indications may be performed as noted.  ABI Findings: +---------+------------------+-----+---------+--------+ Right    Rt Pressure (mmHg)IndexWaveform Comment  +---------+------------------+-----+---------+--------+ Brachial 154                                      +---------+------------------+-----+---------+--------+ ATA      176               1.14 triphasic         +---------+------------------+-----+---------+--------+ PTA      192               1.25 triphasic         +---------+------------------+-----+---------+--------+ Great Toe177               1.15 Normal            +---------+------------------+-----+---------+--------+ +---------+------------------+-----+---------+----------------------+ Left     Lt Pressure (mmHg)IndexWaveform Comment                +---------+------------------+-----+---------+----------------------+ Brachial                                 Glucose monitor on ARM +---------+------------------+-----+---------+----------------------+ ATA      160               1.04 triphasic                       +---------+------------------+-----+---------+----------------------+ PTA      190               1.23 triphasic                        +---------+------------------+-----+---------+----------------------+ Great Toe152               0.99 Normal                          +---------+------------------+-----+---------+----------------------+ +-------+-----------+-----------+------------+------------+ ABI/TBIToday's ABIToday's TBIPrevious ABIPrevious TBI +-------+-----------+-----------+------------+------------+ Right  1.25       1.15                                +-------+-----------+-----------+------------+------------+ Left   1.23       .99                                 +-------+-----------+-----------+------------+------------+  Summary: Right: Resting right ankle-brachial index is within normal range. No evidence of significant right lower extremity arterial disease. The right toe-brachial index is normal. Left: Resting left ankle-brachial index is within normal range. No evidence of significant left lower extremity arterial disease. The left toe-brachial index is normal. *See table(s) above for measurements and observations.  Electronically signed by Hortencia Pilar MD on 11/28/2021 at 4:13:33 PM.    Final        Assessment & Plan:  1. Claudication [facility]) The patient does have evidence of plaque reviewed his ultrasound and does not appear to be hemodynamically significant.  Based on this I suspect that the claudication-like symptoms patient may have a neurogenic in nature versus arterial.  Patient does have back pain with activity at times and he has had a history of previous intervention to his back.  If his work-up is absolutely negative for any possible back involvement we can discuss possible angiogram.  Due to the patient's multiple knee surgeries it is also possible that some of his issues with walking may account for deconditioning. - Ambulatory referral to Neurosurgery  2. Primary hypertension Continue antihypertensive medications as already ordered, these medications have been reviewed and there are  no changes at this time.   3. Mixed hyperlipidemia Continue statin as ordered and reviewed, no changes at this time   4. DM type 2 with diabetic mixed hyperlipidemia ([place]) Continue hypoglycemic medications as already ordered, these medications have been reviewed and there are no changes at this time.  Hgb A1C to be monitored as already arranged by primary service    Current Outpatient Medications on File Prior to Visit  Medication Sig Dispense Refill   apixaban (ELIQUIS) 5 MG TABS tablet Take 1 tablet (5 mg total) by mouth 2 (two) times daily. 60 tablet 1   diltiazem (CARDIZEM CD) 300 MG 24 hr capsule Take 300 mg by mouth at bedtime.     fenofibrate 160 MG tablet Take 160 mg by mouth daily.      fluticasone (FLONASE) 50 MCG/ACT nasal spray Place 1 spray into the nose daily as needed for allergies.     furosemide (LASIX) 40 MG tablet Take 0.5 tablets (20 mg total) by mouth daily. 30 tablet 1   glipiZIDE (GLUCOTROL) 5 MG tablet Take 5 mg by mouth 2 (two) times daily.     insulin NPH-regular Human (HUMULIN 70/30) (70-30) 100 UNIT/ML injection Inject 25-50 Units into the skin See admin instructions. Inject 50 units into the skin in the morning and 25 units in the evening     lovastatin (MEVACOR) 40 MG tablet Take 40 mg by mouth daily with supper.      metFORMIN (GLUCOPHAGE) 1000 MG tablet Take 1,000 mg by mouth 2 (two) times daily with a meal.      metoprolol succinate (TOPROL-XL) 100 MG 24 hr tablet Take 100 mg by mouth at bedtime.     Multiple Vitamins-Minerals (ADVANCED DIABETIC MULTIVITAMIN) TABS Take 1 tablet by mouth daily.     triamterene-hydrochlorothiazide (MAXZIDE-25) 37.5-25 MG tablet Take 1 tablet by mouth daily.     venlafaxine XR (EFFEXOR-XR) 37.5 MG 24 hr capsule Take 37.5 mg by mouth daily with breakfast.     vitamin B-12 (CYANOCOBALAMIN) 500 MCG tablet Take 1,000 mcg by mouth daily.     Continuous Blood Gluc Sensor (FREESTYLE LIBRE 14 DAY SENSOR) MISC SMARTSIG:1 Kit(s) Topical  Every 2 Weeks     No current facility-administered medications on file prior to visit.    There are no Patient Instructions on file for this visit. No follow-ups on file.   Kris Hartmann, NP

## 2022-01-09 ENCOUNTER — Telehealth: Payer: Self-pay | Admitting: Cardiovascular Disease

## 2022-01-09 MED ORDER — APIXABAN 5 MG PO TABS: 5.0000 mg | ORAL_TABLET | Freq: Two times a day (BID) | ORAL | 3 refills | Status: DC

## 2022-01-09 NOTE — Telephone Encounter (Signed)
Spoke with patient and reviewed that I am just waiting for provider to sign and then I will get that sent to the company. He was appreciative for the help with no further questions at this time.

## 2022-01-09 NOTE — Telephone Encounter (Signed)
Patient is asking the nurse to give him a call back. He states she was doing the paper work for him to do drug program. Please advise

## 2022-01-09 NOTE — Telephone Encounter (Signed)
Left voicemail message to call back  

## 2022-01-15 NOTE — Telephone Encounter (Signed)
Forms have been faxed 

## 2022-01-20 ENCOUNTER — Telehealth: Payer: Self-pay | Admitting: Cardiovascular Disease

## 2022-01-20 NOTE — Telephone Encounter (Signed)
See other telephone encounter. Application was completed and faxed to company.

## 2022-01-20 NOTE — Telephone Encounter (Signed)
Left voicemail message to call back  

## 2022-01-20 NOTE — Telephone Encounter (Signed)
Pt is requesting call back from Jule Ser, RN in regards to forms. He provided no more information and just wanted a return call.

## 2022-01-21 NOTE — Telephone Encounter (Signed)
Spoke with patient and he wanted to discuss his wife and when possible for her to come see Dr. Rockey Situ. He inquired if we could obtain her records. Advised that once she makes appointment we then request records for her appointment. He verbalized understanding of that information.   He then wanted to know if we had heard anything about his patient assistance application. Advised that I would call to check on the status. No further needs for now.

## 2022-01-21 NOTE — Telephone Encounter (Signed)
This is duplicate encounter. See other note from 10/2

## 2022-01-21 NOTE — Telephone Encounter (Signed)
Spoke with patient and he reports he no longer has a job. So he is no longer making that income.   Called [employer] and they require patient to call in and request appeal of the denial he received. He would also need to send in letter reporting that he no longer is employed and list his monthly expenses including any household bills. Will reach out to patient to update him on these requirements.

## 2022-01-21 NOTE — Telephone Encounter (Signed)
Spoke with patient and reviewed that he will need to write an appeal letter with the following information: Appeal their denial No longer employed Monthly expenses including bills Also, call their number at [phone]  Instructed him to please give [place] a call back if he should have any further questions.

## 2022-01-21 NOTE — Telephone Encounter (Signed)
Left voicemail message to call back for review of recommendations necessary for him to appeal.

## 2022-01-21 NOTE — Telephone Encounter (Signed)
Called [employer] and patient was denied for assistance due to gross income of $95,005.00. They report if there are any changes in his income or if they are no longer making that amount to please submit letter for reconsideration and they can process that. Will call and update patient on this denial.

## 2022-01-21 NOTE — Telephone Encounter (Signed)
See telephone encounter on 01/09/22 regarding patient assistance application

## 2022-01-21 NOTE — Telephone Encounter (Signed)
Patient states he is returning an additional call from [facility], Therapist, sports.

## 2022-01-31 ENCOUNTER — Telehealth: Payer: Self-pay | Admitting: Cardiovascular Disease

## 2022-01-31 NOTE — Telephone Encounter (Signed)
Spoke with patient and he was denied patient assistance through company due to income. He is requesting something different because of cost. Inquired how much he has left and he states he has already run out. Advised that I would need to check with provider on other options and then give him a call back. He verbalized understanding with no further questions at this time.

## 2022-01-31 NOTE — Telephone Encounter (Signed)
Pt c/o medication issue:  1. Name of Medication:   apixaban (ELIQUIS) 5 MG TABS tablet  2. How are you currently taking this medication (dosage and times per day)?   As prescribed  3. Are you having a reaction (difficulty breathing--STAT)?   No  4. What is your medication issue?   Patient stated this medication is too expensive and would like to get alternate medication.  Patient stated he applied to the program with the makers of Eliquis and was turned down.

## 2022-02-03 NOTE — Telephone Encounter (Signed)
Left voicemail message to call back for review of information below.    Minna Merritts, MD  Sent: Fri January 31, 2022  5:44 PM  To: [facility] T[facility]     Message  Options are the usual:  Warfarin versus try for generic Pradaxa 150 twice daily (could also try goodrx ),   Other option would be Xarelto 20 mg daily, try jansen select direct from a company if needed  Thx  TGollan   Pradaxa with GoodRx $77.00 Xarelto $85.00 Warfarin $4.00

## 2022-02-03 NOTE — Telephone Encounter (Signed)
Pt is returning call. Requesting call back.  

## 2022-02-04 MED ORDER — RIVAROXABAN 20 MG PO TABS: 20.0000 mg | ORAL_TABLET | Freq: Every day | ORAL | 11 refills | Status: DC

## 2022-02-04 MED ORDER — RIVAROXABAN 20 MG PO TABS: 20.0000 mg | ORAL_TABLET | Freq: Every day | ORAL | 3 refills | Status: DC

## 2022-02-04 NOTE — Telephone Encounter (Signed)
Patient returned call

## 2022-02-04 NOTE — Telephone Encounter (Addendum)
Spoke with patient and reviewed medication options that are available and prices for each medication. After review he decided to switch to Xarelto. Will send that into [facility] and then [facility]. He was appreciative for the call and information. Instructed him to call back if he should have any problems. He verbalized understanding with no further questions at this time.

## 2022-02-17 ENCOUNTER — Encounter (INDEPENDENT_AMBULATORY_CARE_PROVIDER_SITE_OTHER): Payer: Self-pay

## 2022-02-18 ENCOUNTER — Telehealth: Payer: Self-pay | Admitting: Cardiovascular Disease

## 2022-02-18 NOTE — Telephone Encounter (Signed)
Refill Request.  

## 2022-02-18 NOTE — Telephone Encounter (Signed)
Kristopher Oneal and informed pt is no longer on Eliquis 5mg  twice daily.  He is on Xarelto 20mg  daily.  [employer] discontinued Eliquis.  According to chart pt is getting Xarelto from [facility].

## 2022-02-18 NOTE — Telephone Encounter (Signed)
*  STAT* If patient is at the pharmacy, call can be transferred to refill team.   1. Which medications need to be refilled? (please list name of each medication and dose if known) eliquis 5 mg  2. Which pharmacy/location (including street and city if local pharmacy) is medication to be sent to? [facility] [place] fax: [phone]  3. Do they need a 30 day or 90 day supply? 30 day

## 2022-05-26 ENCOUNTER — Encounter: Payer: Self-pay | Admitting: Cardiovascular Disease

## 2022-06-23 NOTE — Progress Notes (Unsigned)
Cardiology Office Note  Date:  06/24/2022   ID:  Kristopher Oneal, DOB January 23, 1951, MRN [id number]  PCP:  Rusty Aus, MD   Chief Complaint  Patient presents with   6 month follow up     "Doing well." Medications reviewed by the patient verbally.     HPI:  [name] is a 72 year old gentleman with  obesity,  hypertension, hyperlipidemia,  diabetes type 2,  permanent atrial fibrillation documented back to 2020 presenting to the hospital July 2023 with worsening shortness of breath, atrial fibrillation with RVR, diastolic CHF atrial fibrillation, likely now permanent, noted dating back to 2020 on EKG He presents for office follow-up of his atrial fibrillation and diastolic CHF  Last seen by myself in clinic September 2023 In follow-up today reports he feels relatively well Denies significant lower extremity edema, no abdominal distention,  Weight stable Fasting at times and efforts to lose weight Weight stable 270 pounds  Toe infection, followed by podiatry, on ABX  Lab work reviewed A1C 8.7 Forgets insulin in Am Total chol 150, LDL 70  Prior hospital admission for acute on chronic diastolic CHF in the setting of atrial fibrillation with RVR, D/c from hospital 12/04/21  Compliant with his Eliquis 5 twice daily Remains on Lasix 20 daily creatinine stable 1.5  EKG personally reviewed by myself on todays visit Atrial fibrillation with rate 83 bpm no significant ST-T wave changes  PMH:   has a past medical history of Anxiety, Arrhythmia, Arthritis, CHF (congestive heart failure) ([place]), Diabetes mellitus without complication ([place]), Dysrhythmia, Heart murmur, High cholesterol, and Hypertension.  PSH:    Past Surgical History:  Procedure Laterality Date   BACK SURGERY     l5/s1   COLONOSCOPY     KNEE ARTHROPLASTY Right 01/02/2020   Procedure: COMPUTER ASSISTED TOTAL KNEE ARTHROPLASTY;  Surgeon: Dereck Leep, MD;  Location: [facility];  Service: Orthopedics;  Laterality:  Right;   KNEE ARTHROPLASTY Left 11/19/2020   Procedure: COMPUTER ASSISTED TOTAL KNEE ARTHROPLASTY;  Surgeon: Dereck Leep, MD;  Location: [facility];  Service: Orthopedics;  Laterality: Left;   ORIF PATELLA Left 04/16/2021   Procedure: OPEN REDUCTION INTERNAL (ORIF) FIXATION PATELLA;  Surgeon: Dereck Leep, MD;  Location: [facility];  Service: Orthopedics;  Laterality: Left;   ORIF PATELLA Left 05/10/2021   Procedure: OPEN REDUCTION INTERNAL (ORIF) FIXATION PATELLA;  Surgeon: Dereck Leep, MD;  Location: [facility];  Service: Orthopedics;  Laterality: Left;    Current Outpatient Medications  Medication Sig Dispense Refill   amoxicillin-clavulanate (AUGMENTIN) 875-125 MG tablet Take 1 tablet by mouth 2 (two) times daily.     Continuous Blood Gluc Sensor (FREESTYLE LIBRE 14 DAY SENSOR) MISC SMARTSIG:1 Kit(s) Topical Every 2 Weeks     diltiazem (CARDIZEM CD) 300 MG 24 hr capsule Take 300 mg by mouth at bedtime.     doxycycline (VIBRA-TABS) 100 MG tablet Take 100 mg by mouth 2 (two) times daily.     ELIQUIS 5 MG TABS tablet Take 5 mg by mouth 2 (two) times daily.     fenofibrate 160 MG tablet Take 160 mg by mouth daily.      fluticasone (FLONASE) 50 MCG/ACT nasal spray Place 1 spray into the nose daily as needed for allergies.     furosemide (LASIX) 40 MG tablet Take 0.5 tablets (20 mg total) by mouth daily. 30 tablet 1   glipiZIDE (GLUCOTROL) 5 MG tablet Take 5 mg by mouth 2 (two) times daily.  insulin NPH-regular Human (HUMULIN 70/30) (70-30) 100 UNIT/ML injection Inject 25-50 Units into the skin See admin instructions. Inject 50 units into the skin in the morning and 25 units in the evening     lovastatin (MEVACOR) 40 MG tablet Take 40 mg by mouth daily with supper.      metFORMIN (GLUCOPHAGE) 1000 MG tablet Take 1,000 mg by mouth 2 (two) times daily with a meal.      metoprolol succinate (TOPROL-XL) 100 MG 24 hr tablet Take 100 mg by mouth at bedtime.     Multiple Vitamins-Minerals  (ADVANCED DIABETIC MULTIVITAMIN) TABS Take 1 tablet by mouth daily.     sildenafil (VIAGRA) 100 MG tablet Take 100 mg by mouth daily as needed.     triamterene-hydrochlorothiazide (MAXZIDE-25) 37.5-25 MG tablet Take 1 tablet by mouth daily.     venlafaxine XR (EFFEXOR-XR) 37.5 MG 24 hr capsule Take 37.5 mg by mouth daily with breakfast.     vitamin B-12 (CYANOCOBALAMIN) 500 MCG tablet Take 1,000 mcg by mouth daily.     No current facility-administered medications for this visit.    Allergies:   Gabapentin   Social History:  The patient  reports that he quit smoking about 44 years ago. His smoking use included cigarettes. He has never used smokeless tobacco. He reports current alcohol use. He reports that he does not use drugs.   Family History:   family history includes Dementia in his mother; Emphysema in his father; Heart attack in his brother and father.    Review of Systems: Review of Systems  Constitutional: Negative.   HENT: Negative.    Respiratory: Negative.    Cardiovascular: Negative.   Gastrointestinal: Negative.   Musculoskeletal: Negative.   Neurological: Negative.   Psychiatric/Behavioral: Negative.    All other systems reviewed and are negative.   PHYSICAL EXAM: VS:  BP 118/60 (BP Location: Left Arm, Patient Position: Sitting, Cuff Size: Large)   Pulse 83   Ht '6\' 2"'$  (1.88 m)   Wt 270 lb 6 oz (122.6 kg)   SpO2 97%   BMI 34.71 kg/m  , BMI Body mass index is 34.71 kg/m. Constitutional:  oriented to person, place, and time. No distress.  Obese HENT:  Head: Grossly normal Eyes:  no discharge. No scleral icterus.  Neck: No JVD, no carotid bruits  Cardiovascular: Irregularly irregular, no murmurs appreciated Pulmonary/Chest: Clear to auscultation bilaterally, no wheezes or rails Abdominal: Soft.  no distension.  no tenderness.  Musculoskeletal: Normal range of motion Neurological:  normal muscle tone. Coordination normal. No atrophy Skin: Skin warm and  dry Psychiatric: normal affect, pleasant   Recent Labs: 12/02/2021: B Natriuretic Peptide 241.3 12/04/2021: BUN 27; Creatinine, Ser 1.25; Hemoglobin 12.5; Magnesium 2.2; Platelets 212; Potassium 4.4; Sodium 135    Lipid Panel No results found for: "CHOL", "HDL", "LDLCALC", "TRIG"    Wt Readings from Last 3 Encounters:  06/24/22 270 lb 6 oz (122.6 kg)  01/06/22 272 lb 12.8 oz (123.7 kg)  12/27/21 270 lb 2 oz (122.5 kg)      ASSESSMENT AND PLAN:  Problem List Items Addressed This Visit       Cardiology Problems   HTN (hypertension)   Relevant Medications   ELIQUIS 5 MG TABS tablet   sildenafil (VIAGRA) 100 MG tablet   HLD (hyperlipidemia)   Relevant Medications   ELIQUIS 5 MG TABS tablet   sildenafil (VIAGRA) 100 MG tablet   Atrial fibrillation and flutter ([place])   Relevant Medications   ELIQUIS  5 MG TABS tablet   sildenafil (VIAGRA) 100 MG tablet   Other Relevant Orders   EKG 12-Lead   DM type 2 with diabetic mixed hyperlipidemia (HCC)   Relevant Medications   ELIQUIS 5 MG TABS tablet   sildenafil (VIAGRA) 100 MG tablet   Other Relevant Orders   EKG 12-Lead   Benign essential hypertension   Relevant Medications   ELIQUIS 5 MG TABS tablet   sildenafil (VIAGRA) 100 MG tablet   Other Relevant Orders   EKG 12-Lead   Hyperlipidemia, mixed   Relevant Medications   ELIQUIS 5 MG TABS tablet   sildenafil (VIAGRA) 100 MG tablet   Other Visit Diagnoses     Chronic diastolic CHF (congestive heart failure) (HCC)    -  Primary   Relevant Medications   ELIQUIS 5 MG TABS tablet   sildenafil (VIAGRA) 100 MG tablet   Other Relevant Orders   EKG 12-Lead     Atrial fibrillation, permanent Dating back to 2020 per EKGs Recommend he continue rate control medications including diltiazem ER 300 mg daily and metoprolol succinate 100 daily, Eliquis 5 twice daily  Chronic diastolic CHF August 2, 99991111 hospitalization for diastolic CHF exacerbation in the setting of high fluid  and salt intake Recommend he continue Lasix 20 daily, creatinine stable 1.5, no edema Weight stable  Diabetes type 2 A1c running high, reports he often will miss the morning insulin Infection of toe, followed by podiatry, complications from diabetes, on antibiotics    Total encounter time more than 30 minutes  Greater than 50% was spent in counseling and coordination of care with the patient    Signed, Esmond Plants, M.D., Ph.D. [place], [place]

## 2022-06-24 ENCOUNTER — Encounter: Payer: Self-pay | Admitting: Cardiovascular Disease

## 2022-06-24 ENCOUNTER — Ambulatory Visit: Payer: Medicare Other | Attending: Cardiovascular Disease | Admitting: Cardiovascular Disease

## 2022-06-24 VITALS — BP 118/60 | HR 83 | Ht 74.0 in | Wt 270.4 lb

## 2022-06-24 DIAGNOSIS — I4891 Unspecified atrial fibrillation: Secondary | ICD-10-CM | POA: Diagnosis not present

## 2022-06-24 DIAGNOSIS — E1169 Type 2 diabetes mellitus with other specified complication: Secondary | ICD-10-CM | POA: Diagnosis not present

## 2022-06-24 DIAGNOSIS — I5032 Chronic diastolic (congestive) heart failure: Secondary | ICD-10-CM

## 2022-06-24 DIAGNOSIS — I10 Essential (primary) hypertension: Secondary | ICD-10-CM

## 2022-06-24 DIAGNOSIS — E782 Mixed hyperlipidemia: Secondary | ICD-10-CM

## 2022-06-24 DIAGNOSIS — I4892 Unspecified atrial flutter: Secondary | ICD-10-CM

## 2022-06-24 MED ORDER — LOVASTATIN 40 MG PO TABS: 40.0000 mg | ORAL_TABLET | Freq: Every day | ORAL | 3 refills | Status: AC

## 2022-06-24 MED ORDER — FUROSEMIDE 20 MG PO TABS: 20.0000 mg | ORAL_TABLET | Freq: Every day | ORAL | 3 refills | Status: DC

## 2022-06-24 MED ORDER — DILTIAZEM HCL ER COATED BEADS 300 MG PO CP24: 300.0000 mg | ORAL_CAPSULE | Freq: Every day | ORAL | 3 refills | Status: DC

## 2022-06-24 MED ORDER — METOPROLOL SUCCINATE ER 100 MG PO TB24: 100.0000 mg | ORAL_TABLET | Freq: Every day | ORAL | 3 refills | Status: AC

## 2022-06-24 MED ORDER — TRIAMTERENE-HCTZ 37.5-25 MG PO TABS: 1.0000 | ORAL_TABLET | Freq: Every day | ORAL | 3 refills | Status: DC

## 2022-06-24 MED ORDER — ELIQUIS 5 MG PO TABS: 5.0000 mg | ORAL_TABLET | Freq: Two times a day (BID) | ORAL | 11 refills | Status: DC

## 2022-06-24 NOTE — Patient Instructions (Signed)
Medication Instructions:  No changes  If you need a refill on your cardiac medications before your next appointment, please call your pharmacy.   Lab work: No new labs needed  Testing/Procedures: No new testing needed  Follow-Up: At [facility], you and your health needs are our priority.  As part of our continuing mission to provide you with exceptional heart care, we have created designated Provider Care Teams.  These Care Teams include your primary Cardiologist (physician) and Advanced Practice Providers (APPs -  Physician Assistants and Nurse Practitioners) who all work together to provide you with the care you need, when you need it.  You will need a follow up appointment in 12 months  Providers on your designated Care Team:   Christopher Berge, NP Ryan Dunn, PA-C Cadence Furth, PA-C  COVID-19 Vaccine Information can be found at: [email or web address] For questions related to vaccine distribution or appointments, please email [email or web address] or call [phone].   

## 2022-08-08 IMAGING — RF DG KNEE 1-2V*L*
1 series · 1 of 1 positions shown · non-contrast
Comparison: 04/16/2021

CLINICAL DATA: Hardware replacement

EXAM:
LEFT KNEE - 1-2 VIEW

[Series 1: dg x-ray · 0.20mm/px · 1 of 1 slices shown]
[im 1/1]
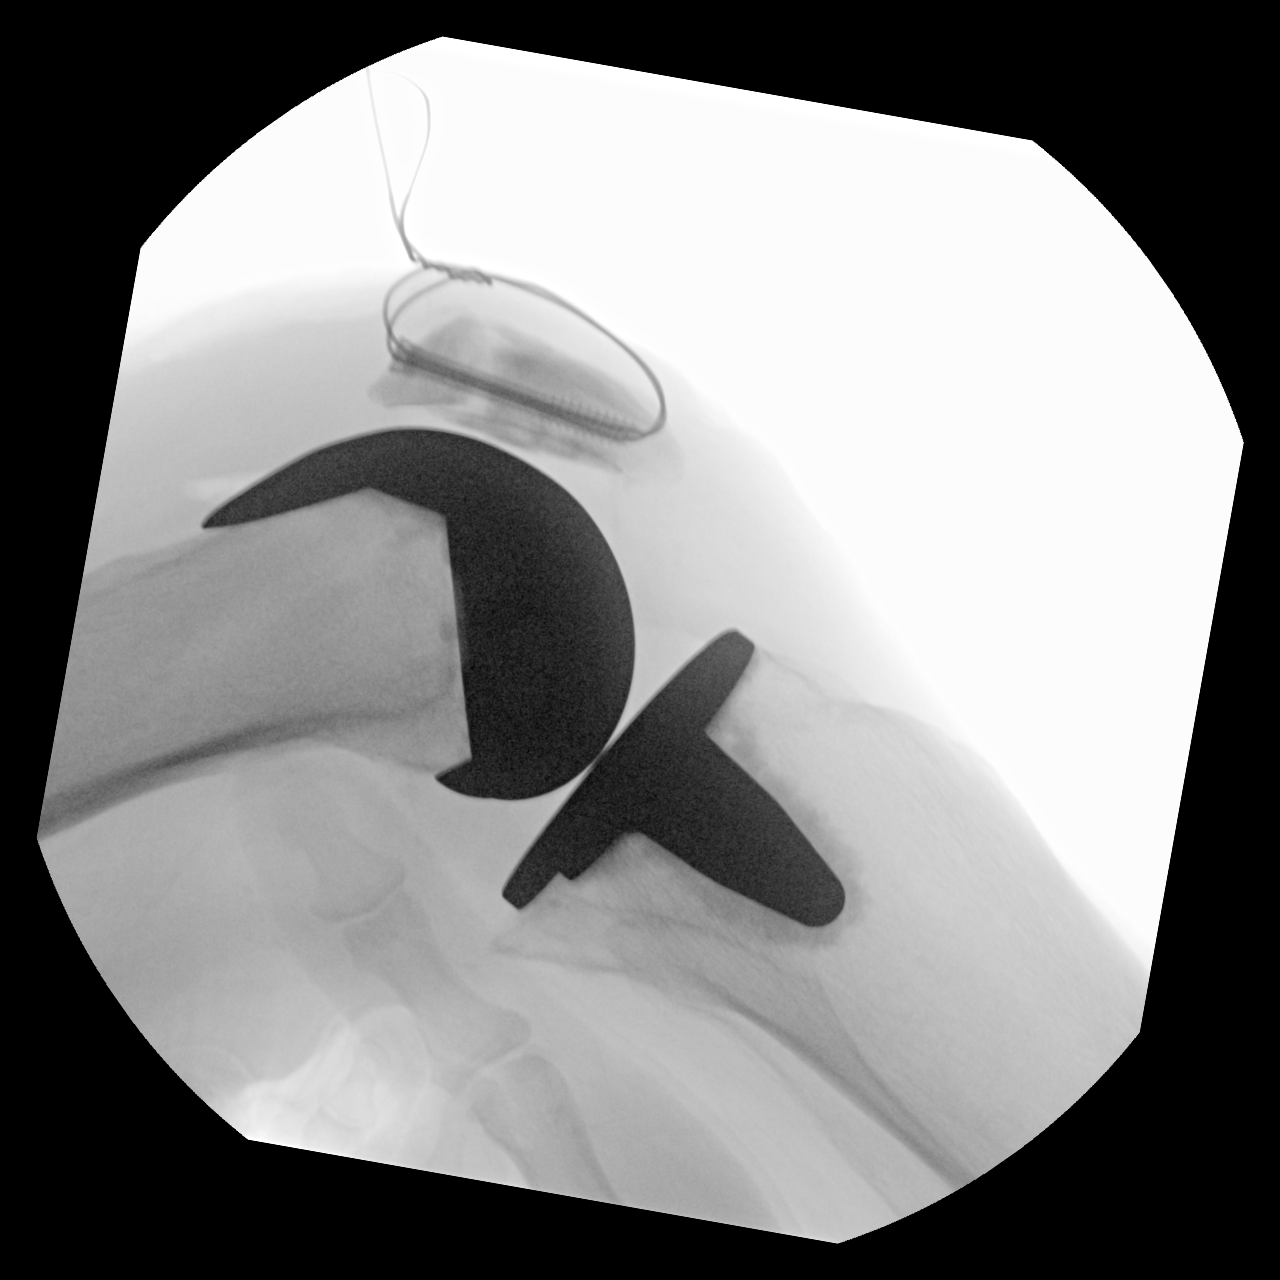

[1 of 1 positions shown; findings below may reference images not displayed]

FINDINGS: Single low resolution intraoperative lateral view of the knee. Total
fluoroscopy time was 1 minutes 15 seconds, fluoroscopic dose of
mGy. The image demonstrates a previous knee replacement. There is
replacement of patellar hardware.
IMPRESSION: Intraoperative fluoroscopic assistance provided during knee surgery

## 2022-11-14 ENCOUNTER — Encounter (INDEPENDENT_AMBULATORY_CARE_PROVIDER_SITE_OTHER): Payer: Medicare Other

## 2022-11-14 ENCOUNTER — Ambulatory Visit (INDEPENDENT_AMBULATORY_CARE_PROVIDER_SITE_OTHER): Payer: Medicare Other | Admitting: Nurse Practitioner

## 2022-11-24 ENCOUNTER — Other Ambulatory Visit (INDEPENDENT_AMBULATORY_CARE_PROVIDER_SITE_OTHER): Payer: Self-pay | Admitting: Nurse Practitioner

## 2022-11-24 DIAGNOSIS — L97509 Non-pressure chronic ulcer of other part of unspecified foot with unspecified severity: Secondary | ICD-10-CM

## 2022-11-26 ENCOUNTER — Ambulatory Visit (INDEPENDENT_AMBULATORY_CARE_PROVIDER_SITE_OTHER): Payer: Medicare Other | Admitting: Nurse Practitioner

## 2022-11-26 ENCOUNTER — Encounter (INDEPENDENT_AMBULATORY_CARE_PROVIDER_SITE_OTHER): Payer: Self-pay | Admitting: Nurse Practitioner

## 2022-11-26 ENCOUNTER — Ambulatory Visit (INDEPENDENT_AMBULATORY_CARE_PROVIDER_SITE_OTHER): Payer: Medicare Other

## 2022-11-26 DIAGNOSIS — E1169 Type 2 diabetes mellitus with other specified complication: Secondary | ICD-10-CM

## 2022-11-26 DIAGNOSIS — E782 Mixed hyperlipidemia: Secondary | ICD-10-CM | POA: Diagnosis not present

## 2022-11-26 DIAGNOSIS — L97509 Non-pressure chronic ulcer of other part of unspecified foot with unspecified severity: Secondary | ICD-10-CM

## 2022-11-27 NOTE — Progress Notes (Signed)
Subjective:    Patient ID: Kristopher Oneal, male    DOB: 1950/05/06, 72 y.o.   MRN: [id number] Chief Complaint  Patient presents with   Follow-up    ABI/consult. left great toe ulcer. clin    Kristopher Oneal is a 72 year old male who returns today for evaluation of left great toe ulceration.  Initially there was concern that he may require amputation but the wound has improved and that is at this time not the thought process.  There is concern due to the patient's risk factors of peripheral arterial disease as wound has been healing somewhat slower and they want to ensure that there has been no change in his vascular status.  The patient has studies approximately a year ago due to have some claudication symptoms however extensive studies including ABIs and aortoiliac duplex showed widely patent vessels with no significant areas of occlusion and it was felt that this claudication was more so related to likely musculoskeletal/lower back issues.  He notes that he developed this wound accidentally as result of a blood blister that subsequently developed into an open wound.  He notes it has been healing but slowly due to receiving excellent wound care by podiatry.  He denies any rest pain or any other open wounds or ulcerations.  Today the patient has an ABI of 1.24 on the right and 1.01 on the left.  His study is done on 07/08/2021 showed an ABI of 1.25 on the right and 1.23 on the left.  Today the patient has strong triphasic tibial artery waveforms bilaterally which was consistent with previous studies.  Toe waveforms were done at all toes clearing his great toe which showed strong normal toe waveforms in all of the vessels.    Review of Systems  Musculoskeletal:  Positive for arthralgias.  Skin:  Positive for wound.  All other systems reviewed and are negative.      Objective:   Physical Exam Vitals reviewed.  HENT:     Head: Normocephalic.  Cardiovascular:     Rate and Rhythm: Normal rate.      Pulses:          Dorsalis pedis pulses are detected w/ Doppler on the right side and detected w/ Doppler on the left side.       Posterior tibial pulses are detected w/ Doppler on the right side and detected w/ Doppler on the left side.  Pulmonary:     Effort: Pulmonary effort is normal.  Musculoskeletal:     Right lower leg: No edema.     Left lower leg: No edema.  Skin:    General: Skin is warm and dry.  Neurological:     Mental Status: He is alert and oriented to person, place, and time.     Gait: Gait abnormal.  Psychiatric:        Mood and Affect: Mood normal.        Behavior: Behavior normal.        Thought Content: Thought content normal.        Judgment: Judgment normal.      BP 111/71 (BP Location: Left Arm)   Pulse 93   Resp 18   Ht 6\' 2"  (1.88 m)   Wt 268 lb 6.4 oz (121.7 kg)   BMI 34.46 kg/m   Past Medical History:  Diagnosis Date   Anxiety    Arrhythmia    atrial fibrillation   Arthritis    CHF (congestive heart failure) (HCC)  Diabetes mellitus without complication (HCC)    type 2   Dysrhythmia    PVC/ a fib   Heart murmur    High cholesterol    Hypertension     Social History   Socioeconomic History   Marital status: Married    Spouse name: Not on file   Number of children: Not on file   Years of education: Not on file   Highest education level: Not on file  Occupational History   Not on file  Tobacco Use   Smoking status: Former    Current packs/day: 0.00    Types: Cigarettes    Quit date: 1980    Years since quitting: 44.6   Smokeless tobacco: Never   Tobacco comments:    1980  Vaping Use   Vaping status: Never Used  Substance and Sexual Activity   Alcohol use: Yes    Comment: rarely   Drug use: Never   Sexual activity: Not on file  Other Topics Concern   Not on file  Social History Narrative   Not on file   Social Determinants of Health   Financial Resource Strain: Low Risk  (06/10/2022)   Received from [facility], [facility]   Overall Financial Resource Strain (CARDIA)    Difficulty of Paying Living Expenses: Not hard at all  Food Insecurity: No Food Insecurity (06/10/2022)   Received from [facility], [facility]   Hunger Vital Sign    Worried About Running Out of Food in the Last Year: Never true    Ran Out of Food in the Last Year: Never true  Transportation Needs: No Transportation Needs (06/10/2022)   Received from [facility], [facility]   [facility] - Transportation    In the past 12 months, has lack of transportation kept you from medical appointments or from getting medications?: No    Lack of Transportation (Non-Medical): No  Physical Activity: Not on file  Stress: Not on file  Social Connections: Not on file  Intimate Partner Violence: Not on file    Past Surgical History:  Procedure Laterality Date   BACK SURGERY     l5/s1   COLONOSCOPY     KNEE ARTHROPLASTY Right 01/02/2020   Procedure: COMPUTER ASSISTED TOTAL KNEE ARTHROPLASTY;  Surgeon: Donato Heinz, MD;  Location: [facility];  Service: Orthopedics;  Laterality: Right;   KNEE ARTHROPLASTY Left 11/19/2020   Procedure: COMPUTER ASSISTED TOTAL KNEE ARTHROPLASTY;  Surgeon: Donato Heinz, MD;  Location: [facility];  Service: Orthopedics;  Laterality: Left;   ORIF PATELLA Left 04/16/2021   Procedure: OPEN REDUCTION INTERNAL (ORIF) FIXATION PATELLA;  Surgeon: Donato Heinz, MD;  Location: [facility];  Service: Orthopedics;  Laterality: Left;   ORIF PATELLA Left 05/10/2021   Procedure: OPEN REDUCTION INTERNAL (ORIF) FIXATION PATELLA;  Surgeon: Donato Heinz, MD;  Location: [facility];  Service: Orthopedics;  Laterality: Left;    Family History  Problem Relation Age of Onset   Dementia Mother    Heart attack Father    Emphysema Father    Heart attack Brother     Allergies  Allergen Reactions   Gabapentin Other (See Comments)     Confusion       Latest Ref Rng & Units 12/04/2021    5:13 AM 12/03/2021    1:48 AM 12/02/2021   10:58 AM  CBC  WBC 4.0 - 10.5 K/uL 9.2  13.0  14.5   Hemoglobin 13.0 - 17.0 g/dL 16.1  09.6  04.5   Hematocrit 39.0 - 52.0 % 39.2  41.3  43.1   Platelets 150 - 400 K/uL 212  213  226       CMP     Component Value Date/Time   NA 135 12/04/2021 1141   K 4.4 12/04/2021 1141   CL 99 12/04/2021 1141   CO2 26 12/04/2021 1141   GLUCOSE 370 (H) 12/04/2021 1141   BUN 27 (H) 12/04/2021 1141   CREATININE 1.25 (H) 12/04/2021 1141   CALCIUM 8.8 (L) 12/04/2021 1141   PROT 7.6 04/16/2021 0619   ALBUMIN 3.8 04/16/2021 0619   AST 25 04/16/2021 0619   ALT 33 04/16/2021 0619   ALKPHOS 49 04/16/2021 0619   BILITOT 0.4 04/16/2021 0619   GFRNONAA >60 12/04/2021 1141     No results found.     Assessment & Plan:   1. Ulcer of great toe, unspecified laterality, unspecified ulcer stage (HCC) The patient studies today are fairly consistent with the study she had approximately her ago.  While he does have some mild plaque formation he actually does not have any significant obstruction and his studies today are actually normal.  He notes that his wounds are healing and because of this I think it certainly reasonable to allow him to continue to work with podiatry and to allow his wound to continue to progress with healing without significant intervention.  However I did discuss with the patient that if his wound significantly worsens or suddenly stopped showing improvement that an angiogram may be beneficial in order to gain a closure I did look at his perfusion.  Following discussion patient is agreeable to return in 7 to 8 weeks in order to evaluate the wound progression.  2. DM type 2 with diabetic mixed hyperlipidemia (HCC) Continue hypoglycemic medications as already ordered, these medications have been reviewed and there are no changes at this time.  Hgb A1C to be monitored as already arranged by  primary service  3. Mixed hyperlipidemia Continue statin as ordered and reviewed, no changes at this time   Current Outpatient Medications on File Prior to Visit  Medication Sig Dispense Refill   Continuous Blood Gluc Sensor (FREESTYLE LIBRE 14 DAY SENSOR) MISC SMARTSIG:1 Kit(s) Topical Every 2 Weeks     diltiazem (CARDIZEM CD) 300 MG 24 hr capsule Take 1 capsule (300 mg total) by mouth at bedtime. 90 capsule 3   doxycycline (VIBRA-TABS) 100 MG tablet Take 100 mg by mouth daily.     ELIQUIS 5 MG TABS tablet Take 1 tablet (5 mg total) by mouth 2 (two) times daily. 60 tablet 11   fenofibrate 160 MG tablet Take 160 mg by mouth daily.      fluticasone (FLONASE) 50 MCG/ACT nasal spray Place 1 spray into the nose daily as needed for allergies.     furosemide (LASIX) 20 MG tablet Take 1 tablet (20 mg total) by mouth daily. (Patient taking differently: Take 20 mg by mouth as needed for edema.) 90 tablet 3   glipiZIDE (GLUCOTROL) 5 MG tablet Take 5 mg by mouth 2 (two) times daily.     hydroxychloroquine (PLAQUENIL) 200 MG tablet Take 200 mg by mouth 2 (two) times daily.     insulin NPH-regular Human (HUMULIN 70/30) (70-30) 100 UNIT/ML injection Inject 25-50 Units into the skin See admin instructions. Inject 50 units into the skin in the morning and 25 units in the evening  lovastatin (MEVACOR) 40 MG tablet Take 1 tablet (40 mg total) by mouth daily with supper. 90 tablet 3   metFORMIN (GLUCOPHAGE) 1000 MG tablet Take 1,000 mg by mouth 2 (two) times daily with a meal.      metoprolol succinate (TOPROL-XL) 100 MG 24 hr tablet Take 1 tablet (100 mg total) by mouth at bedtime. 90 tablet 3   Multiple Vitamins-Minerals (ADVANCED DIABETIC MULTIVITAMIN) TABS Take 1 tablet by mouth daily.     mupirocin ointment (BACTROBAN) 2 % Apply 1 Application topically daily.     sildenafil (VIAGRA) 100 MG tablet Take 100 mg by mouth daily as needed.     triamterene-hydrochlorothiazide (MAXZIDE-25) 37.5-25 MG tablet  Take 1 tablet by mouth daily. 90 tablet 3   venlafaxine XR (EFFEXOR-XR) 37.5 MG 24 hr capsule Take 37.5 mg by mouth daily with breakfast.     vitamin B-12 (CYANOCOBALAMIN) 500 MCG tablet Take 1,000 mcg by mouth daily.     No current facility-administered medications on file prior to visit.    There are no Patient Instructions on file for this visit. No follow-ups on file.   Georgiana Spinner, NP

## 2023-01-21 ENCOUNTER — Ambulatory Visit (INDEPENDENT_AMBULATORY_CARE_PROVIDER_SITE_OTHER): Payer: Medicare Other | Admitting: Nurse Practitioner

## 2023-02-05 ENCOUNTER — Encounter: Payer: Self-pay | Admitting: Internal Medicine

## 2023-02-05 ENCOUNTER — Inpatient Hospital Stay
Admission: EM | Admit: 2023-02-05 | Discharge: 2023-02-11 | DRG: 617 | Disposition: A | Discharge status: Home / Self Care | Payer: Medicare Other | Source: Ambulatory Visit | Attending: Obstetrics and Gynecology | Admitting: Obstetrics and Gynecology

## 2023-02-05 ENCOUNTER — Inpatient Hospital Stay: Payer: Medicare Other

## 2023-02-05 ENCOUNTER — Emergency Department: Payer: Medicare Other

## 2023-02-05 DIAGNOSIS — Z794 Long term (current) use of insulin: Secondary | ICD-10-CM

## 2023-02-05 DIAGNOSIS — E1169 Type 2 diabetes mellitus with other specified complication: Secondary | ICD-10-CM | POA: Diagnosis present

## 2023-02-05 DIAGNOSIS — E785 Hyperlipidemia, unspecified: Secondary | ICD-10-CM | POA: Diagnosis not present

## 2023-02-05 DIAGNOSIS — Z7902 Long term (current) use of antithrombotics/antiplatelets: Secondary | ICD-10-CM

## 2023-02-05 DIAGNOSIS — I10 Essential (primary) hypertension: Secondary | ICD-10-CM | POA: Diagnosis present

## 2023-02-05 DIAGNOSIS — M1380 Other specified arthritis, unspecified site: Secondary | ICD-10-CM | POA: Diagnosis present

## 2023-02-05 DIAGNOSIS — Z7984 Long term (current) use of oral hypoglycemic drugs: Secondary | ICD-10-CM | POA: Diagnosis not present

## 2023-02-05 DIAGNOSIS — Z825 Family history of asthma and other chronic lower respiratory diseases: Secondary | ICD-10-CM

## 2023-02-05 DIAGNOSIS — Z7901 Long term (current) use of anticoagulants: Secondary | ICD-10-CM | POA: Diagnosis not present

## 2023-02-05 DIAGNOSIS — M86172 Other acute osteomyelitis, left ankle and foot: Secondary | ICD-10-CM | POA: Diagnosis present

## 2023-02-05 DIAGNOSIS — Z96653 Presence of artificial knee joint, bilateral: Secondary | ICD-10-CM | POA: Diagnosis present

## 2023-02-05 DIAGNOSIS — E1165 Type 2 diabetes mellitus with hyperglycemia: Secondary | ICD-10-CM | POA: Diagnosis present

## 2023-02-05 DIAGNOSIS — F419 Anxiety disorder, unspecified: Secondary | ICD-10-CM | POA: Diagnosis present

## 2023-02-05 DIAGNOSIS — M869 Osteomyelitis, unspecified: Secondary | ICD-10-CM | POA: Diagnosis not present

## 2023-02-05 DIAGNOSIS — E1140 Type 2 diabetes mellitus with diabetic neuropathy, unspecified: Secondary | ICD-10-CM | POA: Diagnosis present

## 2023-02-05 DIAGNOSIS — Z8249 Family history of ischemic heart disease and other diseases of the circulatory system: Secondary | ICD-10-CM

## 2023-02-05 DIAGNOSIS — I739 Peripheral vascular disease, unspecified: Secondary | ICD-10-CM | POA: Insufficient documentation

## 2023-02-05 DIAGNOSIS — Z79899 Other long term (current) drug therapy: Secondary | ICD-10-CM | POA: Diagnosis not present

## 2023-02-05 DIAGNOSIS — M064 Inflammatory polyarthropathy: Secondary | ICD-10-CM | POA: Diagnosis present

## 2023-02-05 DIAGNOSIS — I700 Atherosclerosis of aorta: Secondary | ICD-10-CM | POA: Diagnosis not present

## 2023-02-05 DIAGNOSIS — Z9889 Other specified postprocedural states: Secondary | ICD-10-CM

## 2023-02-05 DIAGNOSIS — S98119A Complete traumatic amputation of unspecified great toe, initial encounter: Secondary | ICD-10-CM | POA: Insufficient documentation

## 2023-02-05 DIAGNOSIS — M86272 Subacute osteomyelitis, left ankle and foot: Secondary | ICD-10-CM

## 2023-02-05 DIAGNOSIS — I70201 Unspecified atherosclerosis of native arteries of extremities, right leg: Secondary | ICD-10-CM | POA: Diagnosis present

## 2023-02-05 DIAGNOSIS — I482 Chronic atrial fibrillation, unspecified: Secondary | ICD-10-CM | POA: Diagnosis present

## 2023-02-05 DIAGNOSIS — Z888 Allergy status to other drugs, medicaments and biological substances status: Secondary | ICD-10-CM

## 2023-02-05 DIAGNOSIS — I70245 Atherosclerosis of native arteries of left leg with ulceration of other part of foot: Secondary | ICD-10-CM | POA: Diagnosis present

## 2023-02-05 DIAGNOSIS — E119 Type 2 diabetes mellitus without complications: Secondary | ICD-10-CM

## 2023-02-05 DIAGNOSIS — D72829 Elevated white blood cell count, unspecified: Secondary | ICD-10-CM | POA: Diagnosis present

## 2023-02-05 DIAGNOSIS — L03116 Cellulitis of left lower limb: Secondary | ICD-10-CM | POA: Diagnosis present

## 2023-02-05 DIAGNOSIS — M7989 Other specified soft tissue disorders: Secondary | ICD-10-CM | POA: Insufficient documentation

## 2023-02-05 DIAGNOSIS — E782 Mixed hyperlipidemia: Secondary | ICD-10-CM | POA: Diagnosis present

## 2023-02-05 DIAGNOSIS — R600 Localized edema: Secondary | ICD-10-CM | POA: Diagnosis present

## 2023-02-05 DIAGNOSIS — Z87891 Personal history of nicotine dependence: Secondary | ICD-10-CM | POA: Diagnosis not present

## 2023-02-05 DIAGNOSIS — L97529 Non-pressure chronic ulcer of other part of left foot with unspecified severity: Secondary | ICD-10-CM | POA: Diagnosis present

## 2023-02-05 DIAGNOSIS — Z8781 Personal history of (healed) traumatic fracture: Secondary | ICD-10-CM

## 2023-02-05 DIAGNOSIS — E1151 Type 2 diabetes mellitus with diabetic peripheral angiopathy without gangrene: Secondary | ICD-10-CM | POA: Diagnosis present

## 2023-02-05 DIAGNOSIS — M1990 Unspecified osteoarthritis, unspecified site: Secondary | ICD-10-CM | POA: Insufficient documentation

## 2023-02-05 DIAGNOSIS — I4821 Permanent atrial fibrillation: Secondary | ICD-10-CM | POA: Diagnosis present

## 2023-02-05 LAB — COMPREHENSIVE METABOLIC PANEL
ALT: 43 U/L (ref 0–44)
AST: 43 U/L — ABNORMAL HIGH (ref 15–41)
Albumin: 4.4 g/dL (ref 3.5–5.0)
Alkaline Phosphatase: 56 U/L (ref 38–126)
Anion gap: 10 (ref 5–15)
BUN: 27 mg/dL — ABNORMAL HIGH (ref 8–23)
CO2: 25 mmol/L (ref 22–32)
Calcium: 9.3 mg/dL (ref 8.9–10.3)
Chloride: 98 mmol/L (ref 98–111)
Creatinine, Ser: 1.49 mg/dL — ABNORMAL HIGH (ref 0.61–1.24)
GFR, Estimated: 49 mL/min — ABNORMAL LOW (ref >60)
Glucose, Bld: 157 mg/dL — ABNORMAL HIGH (ref 70–99)
Potassium: 4.1 mmol/L (ref 3.5–5.1)
Sodium: 133 mmol/L — ABNORMAL LOW (ref 135–145)
Total Bilirubin: 1.2 mg/dL (ref 0.3–1.2)
Total Protein: 8.8 g/dL — ABNORMAL HIGH (ref 6.5–8.1)

## 2023-02-05 LAB — CBC WITH DIFFERENTIAL/PLATELET
Abs Immature Granulocytes: 0.06 10*3/uL (ref 0.00–0.07)
Basophils Absolute: 0.1 10*3/uL (ref 0.0–0.1)
Basophils Relative: 1 %
Eosinophils Absolute: 0 10*3/uL (ref 0.0–0.5)
Eosinophils Relative: 0 %
HCT: 47.4 % (ref 39.0–52.0)
Hemoglobin: 15.5 g/dL (ref 13.0–17.0)
Immature Granulocytes: 1 %
Lymphocytes Relative: 7 %
Lymphs Abs: 1 10*3/uL (ref 0.7–4.0)
MCH: 30.8 pg (ref 26.0–34.0)
MCHC: 32.7 g/dL (ref 30.0–36.0)
MCV: 94 fL (ref 80.0–100.0)
Monocytes Absolute: 1.3 10*3/uL — ABNORMAL HIGH (ref 0.1–1.0)
Monocytes Relative: 10 %
Neutro Abs: 10.5 10*3/uL — ABNORMAL HIGH (ref 1.7–7.7)
Neutrophils Relative %: 81 %
Platelets: 322 10*3/uL (ref 150–400)
RBC: 5.04 MIL/uL (ref 4.22–5.81)
RDW: 13.5 % (ref 11.5–15.5)
WBC: 13 10*3/uL — ABNORMAL HIGH (ref 4.0–10.5)
nRBC: 0 % (ref 0.0–0.2)

## 2023-02-05 LAB — GLUCOSE, CAPILLARY
Glucose-Capillary: 157 mg/dL — ABNORMAL HIGH (ref 70–99)
Glucose-Capillary: 178 mg/dL — ABNORMAL HIGH (ref 70–99)

## 2023-02-05 LAB — PROTIME-INR
INR: 1.2 (ref 0.8–1.2)
Prothrombin Time: 15.7 s — ABNORMAL HIGH (ref 11.4–15.2)

## 2023-02-05 LAB — LACTIC ACID, PLASMA: Lactic Acid, Venous: 1.4 mmol/L (ref 0.5–1.9)

## 2023-02-05 MED ORDER — ONDANSETRON HCL 4 MG/2ML IJ SOLN: 4.0000 mg | Freq: Four times a day (QID) | INTRAMUSCULAR | Status: AC | PRN

## 2023-02-05 MED ORDER — OXYCODONE HCL 5 MG PO TABS
5.0000 mg | ORAL_TABLET | Freq: Four times a day (QID) | ORAL | Status: AC | PRN
  Administered 2023-02-05: 5 mg via ORAL
  Filled 2023-02-05: qty 1

## 2023-02-05 MED ORDER — INSULIN ASPART 100 UNIT/ML IJ SOLN
0.0000 [IU] | Freq: Every day | INTRAMUSCULAR | Status: DC
  Administered 2023-02-06 – 2023-02-10 (×4): 2 [IU] via SUBCUTANEOUS
  Filled 2023-02-05 (×3): qty 1

## 2023-02-05 MED ORDER — VENLAFAXINE HCL ER 37.5 MG PO CP24
37.5000 mg | ORAL_CAPSULE | Freq: Every day | ORAL | Status: DC
  Administered 2023-02-06 – 2023-02-11 (×5): 37.5 mg via ORAL
  Filled 2023-02-05 (×6): qty 1

## 2023-02-05 MED ORDER — SENNOSIDES-DOCUSATE SODIUM 8.6-50 MG PO TABS: 1.0000 | ORAL_TABLET | Freq: Every evening | ORAL | Status: DC | PRN

## 2023-02-05 MED ORDER — ACETAMINOPHEN 650 MG RE SUPP: 650.0000 mg | Freq: Four times a day (QID) | RECTAL | Status: DC | PRN

## 2023-02-05 MED ORDER — VANCOMYCIN HCL IN DEXTROSE 1-5 GM/200ML-% IV SOLN: 1000.0000 mg | Freq: Once | INTRAVENOUS | Status: DC

## 2023-02-05 MED ORDER — VANCOMYCIN HCL 1250 MG/250ML IV SOLN: 1250.0000 mg | INTRAVENOUS | Status: DC

## 2023-02-05 MED ORDER — ACETAMINOPHEN 325 MG PO TABS: 650.0000 mg | ORAL_TABLET | Freq: Four times a day (QID) | ORAL | Status: DC | PRN

## 2023-02-05 MED ORDER — PRAVASTATIN SODIUM 20 MG PO TABS
40.0000 mg | ORAL_TABLET | Freq: Every day | ORAL | Status: DC
  Administered 2023-02-05 – 2023-02-10 (×6): 40 mg via ORAL
  Filled 2023-02-05 (×6): qty 2

## 2023-02-05 MED ORDER — METOPROLOL TARTRATE 5 MG/5ML IV SOLN
5.0000 mg | Freq: Once | INTRAVENOUS | Status: AC
  Administered 2023-02-05: 5 mg via INTRAVENOUS
  Filled 2023-02-05: qty 5

## 2023-02-05 MED ORDER — FLUTICASONE PROPIONATE 50 MCG/ACT NA SUSP: 1.0000 | Freq: Every day | NASAL | Status: DC | PRN

## 2023-02-05 MED ORDER — ONDANSETRON HCL 4 MG PO TABS: 4.0000 mg | ORAL_TABLET | Freq: Four times a day (QID) | ORAL | Status: AC | PRN

## 2023-02-05 MED ORDER — METOPROLOL TARTRATE 5 MG/5ML IV SOLN: 5.0000 mg | INTRAVENOUS | Status: DC | PRN

## 2023-02-05 MED ORDER — DILTIAZEM HCL ER COATED BEADS 300 MG PO CP24
300.0000 mg | ORAL_CAPSULE | Freq: Every day | ORAL | Status: DC
  Administered 2023-02-05 – 2023-02-10 (×6): 300 mg via ORAL
  Filled 2023-02-05 (×6): qty 1

## 2023-02-05 MED ORDER — INSULIN NPH (HUMAN) (ISOPHANE) 100 UNIT/ML ~~LOC~~ SUSP
60.0000 [IU] | Freq: Every day | SUBCUTANEOUS | Status: DC
  Administered 2023-02-06 – 2023-02-08 (×3): 60 [IU] via SUBCUTANEOUS
  Filled 2023-02-05: qty 10

## 2023-02-05 MED ORDER — GUAIFENESIN 100 MG/5ML PO LIQD: 5.0000 mL | Freq: Four times a day (QID) | ORAL | Status: AC | PRN

## 2023-02-05 MED ORDER — METOPROLOL TARTRATE 5 MG/5ML IV SOLN: 2.5000 mg | Freq: Once | INTRAVENOUS | Status: DC

## 2023-02-05 MED ORDER — METOPROLOL SUCCINATE ER 50 MG PO TB24
100.0000 mg | ORAL_TABLET | Freq: Every day | ORAL | Status: DC
  Administered 2023-02-05 – 2023-02-10 (×6): 100 mg via ORAL
  Filled 2023-02-05 (×6): qty 2

## 2023-02-05 MED ORDER — VITAMIN B-12 1000 MCG PO TABS
1000.0000 ug | ORAL_TABLET | Freq: Every day | ORAL | Status: DC
  Administered 2023-02-06 – 2023-02-11 (×5): 1000 ug via ORAL
  Filled 2023-02-05 (×6): qty 1

## 2023-02-05 MED ORDER — MORPHINE SULFATE (PF) 4 MG/ML IV SOLN: 4.0000 mg | INTRAVENOUS | Status: DC | PRN

## 2023-02-05 MED ORDER — SODIUM CHLORIDE 0.9 % IV SOLN
2.0000 g | Freq: Three times a day (TID) | INTRAVENOUS | Status: DC
  Administered 2023-02-05 – 2023-02-06 (×2): 2 g via INTRAVENOUS
  Filled 2023-02-05 (×3): qty 12.5

## 2023-02-05 MED ORDER — HEPARIN SODIUM (PORCINE) 5000 UNIT/ML IJ SOLN
5000.0000 [IU] | Freq: Three times a day (TID) | INTRAMUSCULAR | Status: DC
  Administered 2023-02-05 – 2023-02-06 (×3): 5000 [IU] via SUBCUTANEOUS
  Filled 2023-02-05 (×4): qty 1

## 2023-02-05 MED ORDER — FENOFIBRATE 160 MG PO TABS
160.0000 mg | ORAL_TABLET | Freq: Every day | ORAL | Status: DC
  Administered 2023-02-06: 160 mg via ORAL
  Filled 2023-02-05 (×2): qty 1

## 2023-02-05 MED ORDER — HYDROXYCHLOROQUINE SULFATE 200 MG PO TABS
200.0000 mg | ORAL_TABLET | Freq: Two times a day (BID) | ORAL | Status: DC
  Administered 2023-02-05 – 2023-02-11 (×11): 200 mg via ORAL
  Filled 2023-02-05 (×11): qty 1

## 2023-02-05 MED ORDER — MORPHINE SULFATE (PF) 4 MG/ML IV SOLN
4.0000 mg | Freq: Once | INTRAVENOUS | Status: AC
  Administered 2023-02-05: 4 mg via INTRAVENOUS
  Filled 2023-02-05: qty 1

## 2023-02-05 MED ORDER — TRIAMTERENE-HCTZ 37.5-25 MG PO TABS
1.0000 | ORAL_TABLET | Freq: Every day | ORAL | Status: DC
  Administered 2023-02-06 – 2023-02-11 (×5): 1 via ORAL
  Filled 2023-02-05 (×6): qty 1

## 2023-02-05 MED ORDER — INSULIN ASPART 100 UNIT/ML IJ SOLN
0.0000 [IU] | Freq: Three times a day (TID) | INTRAMUSCULAR | Status: DC
  Administered 2023-02-05: 2 [IU] via SUBCUTANEOUS
  Administered 2023-02-06: 1 [IU] via SUBCUTANEOUS
  Administered 2023-02-07: 2 [IU] via SUBCUTANEOUS
  Administered 2023-02-07: 3 [IU] via SUBCUTANEOUS
  Administered 2023-02-08 (×2): 2 [IU] via SUBCUTANEOUS
  Administered 2023-02-08 – 2023-02-09 (×3): 1 [IU] via SUBCUTANEOUS
  Administered 2023-02-10: 2 [IU] via SUBCUTANEOUS
  Filled 2023-02-05 (×10): qty 1

## 2023-02-05 MED ORDER — VANCOMYCIN HCL 2000 MG/400ML IV SOLN
2000.0000 mg | Freq: Once | INTRAVENOUS | Status: AC
  Administered 2023-02-05: 2000 mg via INTRAVENOUS
  Filled 2023-02-05: qty 400

## 2023-02-05 MED ORDER — METRONIDAZOLE 500 MG/100ML IV SOLN
500.0000 mg | Freq: Once | INTRAVENOUS | Status: AC
  Administered 2023-02-05: 500 mg via INTRAVENOUS
  Filled 2023-02-05: qty 100

## 2023-02-05 MED ORDER — HYDRALAZINE HCL 20 MG/ML IJ SOLN: 5.0000 mg | Freq: Four times a day (QID) | INTRAMUSCULAR | Status: AC | PRN

## 2023-02-05 MED ORDER — SODIUM CHLORIDE 0.9 % IV SOLN
2.0000 g | Freq: Once | INTRAVENOUS | Status: AC
  Administered 2023-02-05: 2 g via INTRAVENOUS
  Filled 2023-02-05: qty 12.5

## 2023-02-05 MED ORDER — ADULT MULTIVITAMIN W/MINERALS CH
1.0000 | ORAL_TABLET | Freq: Every day | ORAL | Status: DC
  Administered 2023-02-06 – 2023-02-11 (×5): 1 via ORAL
  Filled 2023-02-05 (×5): qty 1

## 2023-02-05 MED ORDER — HEPARIN SODIUM (PORCINE) 5000 UNIT/ML IJ SOLN: 5000.0000 [IU] | Freq: Three times a day (TID) | INTRAMUSCULAR | Status: DC

## 2023-02-05 MED ORDER — MELATONIN 5 MG PO TABS: 5.0000 mg | ORAL_TABLET | Freq: Every evening | ORAL | Status: DC | PRN

## 2023-02-05 MED ORDER — INSULIN NPH (HUMAN) (ISOPHANE) 100 UNIT/ML ~~LOC~~ SUSP
30.0000 [IU] | Freq: Every day | SUBCUTANEOUS | Status: DC
  Administered 2023-02-05 – 2023-02-10 (×6): 30 [IU] via SUBCUTANEOUS
  Filled 2023-02-05: qty 10

## 2023-02-05 NOTE — Plan of Care (Signed)
  Problem: Education: Goal: Understanding of post-operative needs will improve Outcome: Progressing   Problem: Pain Managment: Goal: General experience of comfort will improve Outcome: Progressing   Problem: Safety: Goal: Ability to remain free from injury will improve Outcome: Progressing   Problem: Skin Integrity: Goal: Risk for impaired skin integrity will decrease Outcome: Progressing

## 2023-02-05 NOTE — H&P (Addendum)
History and Physical   Kristopher Oneal ZOX:[id number] DOB: [birth year] DOA: 02/05/2023  PCP: Kristopher Penton, MD  Outpatient Specialists: Dr. Mariah Oneal, [facility] cardiology Patient coming from: [facility]  I have personally briefly reviewed patient's old medical records in [facility] EMR.  Chief Concern: left 1st toe OM  HPI: 72 year old male with history of insulin-dependent diabetes mellitus, hypertension, hyperlipidemia, permanent atrial fibrillation on Eliquis, who presents to the emergency department for chief concerns of left foot infection concerning for osteomyelitis.  Patient was sent from outpatient [facility] clinic podiatry.  Vitals in the ED showed T 98.1, respiration rate of 16, heart rate of 130, blood pressure 113/74, SpO2 of 97% on room air.  Serum sodium is 133, potassium 4.1, chloride 98, bicarb 25, BUN of 27, serum creatinine 1.49, eGFR 49, nonfasting blood glucose 157, WBC 13, hemoglobin 15.5, platelets of 322.  Lactic acid is 1.5.  ED treatment: Metoprolol 5 mg IV one-time dose, morphine 4 mg IV, cefepime 2 g IV, metronidazole 500 mg IV, vancomycin. -------------------------------- At bedside, patient was able to tell me his name, age, location, current calendar year.  His spouse was at bedside.  He developed a hematoma about two years ago. In December 2023, patient reports his podiatrist did bedside debridement and chemical debridement (cream). The wound has not healed since.   Patient and spouse noticed that the swelling and incrased warmth started on 10/12.   He denies fever, chills, nausea, vomtiing, chest pain, shortness of breath, syncope, dysuria, hematuria, blood in his stool, diarrhea.   Social history: He lives at home with his spouse. He is a former tobacco user. He smoked cigars and cigartetes 40 years ago. He rarely consumes etoh, two drinks per year. He denies recreational drug use. He is retired and formerly was a Sport and exercise psychologist.   ROS: Constitutional:  no weight change, no fever ENT/Mouth: no sore throat, no rhinorrhea Eyes: no eye pain, no vision changes Cardiovascular: no chest pain, no dyspnea,  no edema, no palpitations Respiratory: no cough, no sputum, no wheezing Gastrointestinal: no nausea, no vomiting, no diarrhea, no constipation Genitourinary: no urinary incontinence, no dysuria, no hematuria Musculoskeletal: no arthralgias, no myalgias Skin: + left 1st toe wound Neuro: + weakness, no loss of consciousness, no syncope Psych: no anxiety, no depression, no decrease appetite Heme/Lymph: no bruising, no bleeding  ED Course: Discussed with emergency medicine provider, patient requiring hospitalization for chief concerns of left foot infection, amputation.  Assessment/Plan  Principal Problem:   Foot osteomyelitis, left (HCC) Active Problems:   Chronic atrial fibrillation (HCC)   HTN (hypertension)   Diabetes mellitus without complication (HCC)   DM type 2 with diabetic mixed hyperlipidemia (HCC)   Hyperlipidemia, mixed   S/P ORIF (open reduction internal fixation) fracture   Leukocytosis   Inflammatory arthritis   Left arm swelling   Assessment and Plan:  * Foot osteomyelitis, left (HCC) MRI: Read as findings consistent with osteomyelitis of the distal first phalanx.  Generalized soft tissue swelling in the great toe with apparent ulceration along the plantar aspect of the distal phalanx.  No well-defined abscess.  Moderate degeneration at the first metatarsophalangeal joint ABI ordered Per podiatry, anticipate OR on 02/07/2023 Continue with broad-spectrum antibiotic Admit to telemetry medical, inpatient  Chronic atrial fibrillation (HCC) Home metoprolol and diltiazem resumed Metoprolol tartrate injection 5 mg IV every 2 hours as needed for heart rate > 120, 2 doses ordered Will hold home Eliquis in anticipation of podiatry procedure  HTN (hypertension) Diltiazem 300 mg  nightly, metoprolol succinate 100 mg nightly,  triamterene-hydrochlorothiazide 37-5-25 mg daily resumed Hydralazine 5 mg IV every 6 hours as needed for SBP greater 160, 4 days ordered  Left arm swelling Addendum: received message from nursing : 'patient IV infiltrated during Vanc infusion notified pharmacy they recommended antidote hyaluronidase.' I evaluated Kristopher Oneal at bedside, left arm is diffusely swollen and tight and not consistently with IV infiltration or extravasation Patient does not remember if he too his AM eliquis 5 mg this morning. Right upper extremity US ordered to evaluate for DVT   Inflammatory arthritis Undifferentiated Resumed home hydroxychloroquine 200 mg p.o. twice daily Continue outpatient follow-up with rheumatologist  Leukocytosis Treat per problem #1, check CBC in a.m.  Hyperlipidemia, mixed Home pravastatin 40 mg daily resumed  DM type 2 with diabetic mixed hyperlipidemia (HCC) Home long acting insulin resumed on admission AM team to adjust as appropriate as patient will need to be n.p.o. Friday evening in anticipation of podiatry procedure on Saturday, 10/19 NPH 30 units nightly, NPH 50 units in the morning at breakfast resumed Insulin SSI with HS coverage ordered Goal inpatient blood glucose is 140-180  Chart reviewed.   DVT prophylaxis: Heparin 5000 units subcutaneous, 4 doses ordered on admission; AM team to resume when the benefits outweigh the risk Code Status: Full code Diet: heart healthy/carb modified Family Communication: updated spouse at bedside Disposition Plan: pending clinical course Consults called: podiatry Admission status: telemetry medical, inpatient  Past Medical History:  Diagnosis Date   Anxiety    Arrhythmia    atrial fibrillation   Arthritis    CHF (congestive heart failure) (HCC)    Diabetes mellitus without complication (HCC)    type 2   Dysrhythmia    PVC/ a fib   Heart murmur    High cholesterol    Hypertension    Past Surgical History:  Procedure  Laterality Date   BACK SURGERY     l5/s1   COLONOSCOPY     KNEE ARTHROPLASTY Right 01/02/2020   Procedure: COMPUTER ASSISTED TOTAL KNEE ARTHROPLASTY;  Surgeon: Donato Heinz, MD;  Location: [facility] ORS;  Service: Orthopedics;  Laterality: Right;   KNEE ARTHROPLASTY Left 11/19/2020   Procedure: COMPUTER ASSISTED TOTAL KNEE ARTHROPLASTY;  Surgeon: Donato Heinz, MD;  Location: [facility] ORS;  Service: Orthopedics;  Laterality: Left;   ORIF PATELLA Left 04/16/2021   Procedure: OPEN REDUCTION INTERNAL (ORIF) FIXATION PATELLA;  Surgeon: Donato Heinz, MD;  Location: [facility] ORS;  Service: Orthopedics;  Laterality: Left;   ORIF PATELLA Left 05/10/2021   Procedure: OPEN REDUCTION INTERNAL (ORIF) FIXATION PATELLA;  Surgeon: Donato Heinz, MD;  Location: [facility] ORS;  Service: Orthopedics;  Laterality: Left;   Social History:  reports that he quit smoking about 44 years ago. His smoking use included cigarettes. He has never used smokeless tobacco. He reports current alcohol use. He reports that he does not use drugs.  Allergies  Allergen Reactions   Gabapentin Other (See Comments)    Confusion   Family History  Problem Relation Age of Onset   Dementia Mother    Heart attack Father    Emphysema Father    Heart attack Brother    Family history: Family history reviewed and not pertinent.   Prior to Admission medications   Medication Sig Start Date End Date Taking? Authorizing Provider  Continuous Blood Gluc Sensor (FREESTYLE LIBRE 14 DAY SENSOR) MISC SMARTSIG:1 Kit(s) Topical Every 2 Weeks 12/20/21   [provider]  diltiazem (CARDIZEM CD) 300  MG 24 hr capsule Take 1 capsule (300 mg total) by mouth at bedtime. 06/24/22   Gollan, Tollie Pizza, MD  ELIQUIS 5 MG TABS tablet Take 1 tablet (5 mg total) by mouth 2 (two) times daily. 06/24/22   Antonieta Iba, MD  fenofibrate 160 MG tablet Take 160 mg by mouth daily.  05/27/18   [provider]  fluticasone (FLONASE) 50 MCG/ACT nasal spray Place 1  spray into the nose daily as needed for allergies.    [provider]  furosemide (LASIX) 20 MG tablet Take 1 tablet (20 mg total) by mouth daily. Patient taking differently: Take 20 mg by mouth as needed for edema. 06/24/22   Antonieta Iba, MD  glipiZIDE (GLUCOTROL) 5 MG tablet Take 5 mg by mouth 2 (two) times daily.    [provider]  hydroxychloroquine (PLAQUENIL) 200 MG tablet Take 200 mg by mouth 2 (two) times daily.    [provider]  insulin NPH-regular Human (HUMULIN 70/30) (70-30) 100 UNIT/ML injection Inject 25-50 Units into the skin See admin instructions. Inject 50 units into the skin in the morning and 25 units in the evening    [provider]  lovastatin (MEVACOR) 40 MG tablet Take 1 tablet (40 mg total) by mouth daily with supper. 06/24/22   Antonieta Iba, MD  metFORMIN (GLUCOPHAGE) 1000 MG tablet Take 1,000 mg by mouth 2 (two) times daily with a meal.  05/27/18   [provider]  metoprolol succinate (TOPROL-XL) 100 MG 24 hr tablet Take 1 tablet (100 mg total) by mouth at bedtime. 06/24/22   Antonieta Iba, MD  Multiple Vitamins-Minerals (ADVANCED DIABETIC MULTIVITAMIN) TABS Take 1 tablet by mouth daily.    [provider]  mupirocin ointment (BACTROBAN) 2 % Apply 1 Application topically daily. 10/23/22   [provider]  sildenafil (VIAGRA) 100 MG tablet Take 100 mg by mouth daily as needed. 05/06/22   [provider]  triamterene-hydrochlorothiazide (MAXZIDE-25) 37.5-25 MG tablet Take 1 tablet by mouth daily. 06/24/22   Antonieta Iba, MD  venlafaxine XR (EFFEXOR-XR) 37.5 MG 24 hr capsule Take 37.5 mg by mouth daily with breakfast.    [provider]  vitamin B-12 (CYANOCOBALAMIN) 500 MCG tablet Take 1,000 mcg by mouth daily.    [provider]   Physical Exam: Vitals:   02/05/23 1200 02/05/23 1230 02/05/23 1517 02/05/23 1540  BP: 130/86 129/73 119/68   Pulse: (!) 120 (!) 115 60    Resp: 18  20   Temp:   97.9 F (36.6 C)   TempSrc:   Oral   SpO2: 98% 100% 97%   Weight:    117 kg  Height:    6\' 2"  (1.88 m)   Constitutional: appears age-appropriate, NAD, calm Eyes: PERRL, lids and conjunctivae normal ENMT: Mucous membranes are moist. Posterior pharynx clear of any exudate or lesions. Age-appropriate dentition. Hearing appropriate Neck: normal, supple, no masses, no thyromegaly Respiratory: clear to auscultation bilaterally, no wheezing, no crackles. Normal respiratory effort. No accessory muscle use.  Cardiovascular: Regular rate and rhythm, no murmurs / rubs / gallops. No extremity edema. Pedal pulses difficult to palpate. No carotid bruits.  Abdomen: Morbidly obese abdomen, no tenderness, no masses palpated, no hepatosplenomegaly. Bowel sounds positive.  Musculoskeletal: no clubbing / cyanosis. No joint deformity upper and lower extremities. Good ROM, no contractures, no atrophy. Normal muscle tone.  Skin: no rashes, lesions, ulcers. No induration Neurologic: First left toe with swelling redness and pain  Psychiatric: Normal judgment and insight. Alert and oriented x 3. Normal mood.   EKG: independently reviewed, showing atrial fibrillation with rate of 128, QTc 464  Chest x-ray on Admission: I personally reviewed and I agree with radiologist reading as below.  MR FOOT LEFT WO CONTRAST  Result Date: 02/05/2023 CLINICAL DATA:  Diabetic ulcer on left great toe. Serosanguineous drainage. No recent fever. Soft tissue infection suspected. EXAM: MRI OF THE LEFT FOOT WITHOUT CONTRAST TECHNIQUE: Multiplanar, multisequence MR imaging of the left forefoot was performed. No intravenous contrast was administered. COMPARISON:  None Available. FINDINGS: Bones/Joint/Cartilage There is cortical destruction of the head of the distal 1st phalanx, best seen on the coronal images. Associated decreased T1 and increased T2 marrow signal. No significant interphalangeal joint  effusion. The proximal phalanx appears intact. The additional toes appear intact. There are moderate degenerative changes at the 1st metatarsophalangeal joint. The visualized metatarsals appear intact. No significant arthropathy at the Lisfranc joint. Ligaments Intact Lisfranc ligament. The collateral ligaments of the metatarsophalangeal joints appear intact. Muscles and Tendons Generalized forefoot muscular atrophy with mild nonspecific muscular edema. The forefoot tendons appear intact, without significant tenosynovitis. Soft tissues Generalized soft tissue swelling in the great toe with apparent ulceration along the plantar aspect of the distal phalanx. There is a small amount of heterogeneous fluid surrounding the distal phalanx, but no well-defined abscess. No other focal soft tissue abnormalities are identified. IMPRESSION: 1. Findings are consistent with osteomyelitis of the distal 1st phalanx. 2. Generalized soft tissue swelling in the great toe with apparent ulceration along the plantar aspect of the distal phalanx. No well-defined abscess. 3. Moderate degenerative changes at the 1st metatarsophalangeal joint. 4. Generalized forefoot muscular atrophy. Electronically Signed   By: Carey Bullocks M.D.   On: 02/05/2023 15:45   DG Chest 2 View  Result Date: 02/05/2023 CLINICAL DATA:  Diabetic ulcer.  Concern for sepsis. EXAM: CHEST - 2 VIEW COMPARISON:  Chest radiograph dated December 02, 2021. FINDINGS: Stable cardiomegaly. The mediastinal contours are within normal limits. Aortic calcification. Bilateral interstitial prominence, most pronounced at the lung bases. No pneumothorax or pleural effusion. No acute osseous abnormality. IMPRESSION: Cardiomegaly with findings suggestive of pulmonary vascular congestion. Electronically Signed   By: Hart Robinsons M.D.   On: 02/05/2023 13:29    Labs on Admission: I have personally reviewed following labs CBC: Recent Labs  Lab 02/05/23 1104  WBC 13.0*   NEUTROABS 10.5*  HGB 15.5  HCT 47.4  MCV 94.0  PLT 322   Basic Metabolic Panel: Recent Labs  Lab 02/05/23 1104  NA 133*  K 4.1  CL 98  CO2 25  GLUCOSE 157*  BUN 27*  CREATININE 1.49*  CALCIUM 9.3   GFR: Estimated Creatinine Clearance: 59.1 mL/min (A) (by C-G formula based on SCr of 1.49 mg/dL (H)).  Liver Function Tests: Recent Labs  Lab 02/05/23 1104  AST 43*  ALT 43  ALKPHOS 56  BILITOT 1.2  PROT 8.8*  ALBUMIN 4.4   Coagulation Profile: Recent Labs  Lab 02/05/23 1148  INR 1.2   Urine analysis:    Component Value Date/Time   COLORURINE YELLOW (A) 12/02/2021 0610   APPEARANCEUR CLEAR (A) 12/02/2021 0610   LABSPEC 1.020 12/02/2021 0610   PHURINE 5.0 12/02/2021 0610   GLUCOSEU 150 (A) 12/02/2021 0610   HGBUR NEGATIVE 12/02/2021 0610   BILIRUBINUR NEGATIVE 12/02/2021 0610   KETONESUR NEGATIVE 12/02/2021 0610   PROTEINUR 30 (A) 12/02/2021 0610   NITRITE NEGATIVE 12/02/2021 0610   LEUKOCYTESUR  TRACE (A) 12/02/2021 0610   This document was prepared using Dragon Voice Recognition software and may include unintentional dictation errors.  Dr. Sedalia Muta [facility]  If 7PM-7AM, please contact overnight-coverage provider If 7AM-7PM, please contact day attending provider [email or web address]  02/05/2023, 7:07 PM

## 2023-02-05 NOTE — ED Notes (Signed)
RN unable to obtain second set of blood cultures. Abx started.

## 2023-02-05 NOTE — Progress Notes (Signed)
Pharmacy Antibiotic Note  Kristopher Oneal is a 72 y.o. male admitted on 02/05/2023 with  osteomyelitis .  Pharmacy has been consulted for Cefepime and Vancomycin dosing.  Plan: Vancomycin 2000 mg IV loading dose followed by Vancomycin 1250 mg IV Q 24 hrs. Goal AUC 400-550. Expected AUC: 460.8 SCr used: 1.49 Expected Cmin: 11.2   Weight: 117 kg (258 lb)  Temp (24hrs), Avg:98 F (36.7 C), Min:97.9 F (36.6 C), Max:98.1 F (36.7 C)  Recent Labs  Lab 02/05/23 1104  WBC 13.0*  CREATININE 1.49*  LATICACIDVEN 1.4    Estimated Creatinine Clearance: 59.1 mL/min (A) (by C-G formula based on SCr of 1.49 mg/dL (H)).    Allergies  Allergen Reactions   Gabapentin Other (See Comments)    Confusion    Antimicrobials this admission: Cefepime 10/17 >>  Vancomycin 10/17 >>   Dose adjustments this admission:  Microbiology results:   Thank you for allowing pharmacy to be a part of this patient's care.  Clovia Cuff, PharmD, BCPS 02/05/2023 3:49 PM

## 2023-02-05 NOTE — Assessment & Plan Note (Signed)
Treat per problem #1, check CBC in a.m.

## 2023-02-05 NOTE — Consult Note (Signed)
PHARMACY -  BRIEF ANTIBIOTIC NOTE   Pharmacy has received consult(s) for vancomycin and cefepime dosing from an ED provider.  The patient's profile has been reviewed for ht/wt/allergies/indication/available labs.    One time order(s) placed for vancomycin 1 gram IV x 1 and cefepime 2 grams IV x 1  Further antibiotics/pharmacy consults should be ordered by admitting physician if indicated.                       Thank you, Barrie Folk 02/05/2023  11:56 AM

## 2023-02-05 NOTE — Sepsis Progress Note (Signed)
Code Sepsis protocol being monitored by eLink.  Lactic acid drawn at 1104 resulted 1.4.  No IVF boluses ordered at this time.  BP's recorded 113/74 and 130/86.

## 2023-02-05 NOTE — Assessment & Plan Note (Signed)
Home pravastatin 40 mg daily resumed

## 2023-02-05 NOTE — ED Provider Notes (Signed)
[name] Provider Note    Event Date/Time   First MD Initiated Contact with Patient 02/05/23 1131     (approximate)   History   Foot Pain   HPI  Kristopher Oneal is a 72 year old male with history of A-fib on Eliquis, T2DM, HTN presenting to the emergency department for evaluation of foot wound.  Patient reports he has been having ongoing issues with an ulcer of his foot since the beginning of this year.  However the last week he has noticed increased redness and pain over this location.  He saw his podiatrist in clinic earlier today who recommended ER presentation given worsened appearance for anticipated admission and amputation.  No fevers or chills.  He was noted to be in A-fib on presentation, reports history of 1 prior episode of this, on diltiazem and metoprolol succinate as well as Eliquis.  Denies chest pain, shortness of breath, lightheadedness.    Physical Exam   Triage Vital Signs: ED Triage Vitals  Encounter Vitals Group     BP 02/05/23 1100 113/74     Systolic BP Percentile --      Diastolic BP Percentile --      Pulse Rate 02/05/23 1100 (!) 130     Resp 02/05/23 1100 16     Temp 02/05/23 1100 98.1 F (36.7 C)     Temp src --      SpO2 02/05/23 1100 97 %     Weight --      Height --      Head Circumference --      Peak Flow --      Pain Score 02/05/23 1101 0     Pain Loc --      Pain Education --      Exclude from Growth Chart --     Most recent vital signs: Vitals:   02/05/23 1230 02/05/23 1517  BP: 129/73 119/68  Pulse: (!) 115 60  Resp:  20  Temp:  97.9 F (36.6 C)  SpO2: 100% 97%     General: Awake, interactive  CV:  Tachycardic with irregular rhythm, variable heart rates from 100s to 130s Resp:  Lungs clear, unlabored respirations.  Abd:  Soft, nondistended.  Neuro:  Symmetric facial movement, fluid speech MSK:  Erythema over the left great toe with an ulcer over the plantar aspect, see pictures  below        ED Results / Procedures / Treatments   Labs (all labs ordered are listed, but only abnormal results are displayed) Labs Reviewed  COMPREHENSIVE METABOLIC PANEL - Abnormal; Notable for the following components:      Result Value   Sodium 133 (*)    Glucose, Bld 157 (*)    BUN 27 (*)    Creatinine, Ser 1.49 (*)    Total Protein 8.8 (*)    AST 43 (*)    GFR, Estimated 49 (*)    All other components within normal limits  CBC WITH DIFFERENTIAL/PLATELET - Abnormal; Notable for the following components:   WBC 13.0 (*)    Neutro Abs 10.5 (*)    Monocytes Absolute 1.3 (*)    All other components within normal limits  PROTIME-INR - Abnormal; Notable for the following components:   Prothrombin Time 15.7 (*)    All other components within normal limits  GLUCOSE, CAPILLARY - Abnormal; Notable for the following components:   Glucose-Capillary 157 (*)    All other components within normal limits  CULTURE,  BLOOD (ROUTINE X 2)  CULTURE, BLOOD (ROUTINE X 2)  LACTIC ACID, PLASMA  URINALYSIS, W/ REFLEX TO CULTURE (INFECTION SUSPECTED)     EKG EKG independently reviewed interpreted by myself (ER attending) demonstrates:  EKG demonstrates A-fib at a rate of 128, QRS 76, QTc 464, no appreciable acute ischemic changes  RADIOLOGY Imaging independently reviewed and interpreted by myself demonstrates:  CXR without focal consolidation, possible vascular congestion noted  PROCEDURES:  Critical Care performed: Yes, see critical care procedure note(s)  CRITICAL CARE Performed by: Trinna Post   Total critical care time: 32 minutes  Critical care time was exclusive of separately billable procedures and treating other patients.  Critical care was necessary to treat or prevent imminent or life-threatening deterioration.  Critical care was time spent personally by me on the following activities: development of treatment plan with patient and/or surrogate as well as nursing,  discussions with consultants, evaluation of patient's response to treatment, examination of patient, obtaining history from patient or surrogate, ordering and performing treatments and interventions, ordering and review of laboratory studies, ordering and review of radiographic studies, pulse oximetry and re-evaluation of patient's condition.   Procedures   MEDICATIONS ORDERED IN ED: Medications  acetaminophen (TYLENOL) tablet 650 mg (has no administration in time range)    Or  acetaminophen (TYLENOL) suppository 650 mg (has no administration in time range)  ondansetron (ZOFRAN) tablet 4 mg (has no administration in time range)    Or  ondansetron (ZOFRAN) injection 4 mg (has no administration in time range)  heparin injection 5,000 Units (has no administration in time range)  senna-docusate (Senokot-S) tablet 1 tablet (has no administration in time range)  metoprolol succinate (TOPROL-XL) 24 hr tablet 100 mg (has no administration in time range)  pravastatin (PRAVACHOL) tablet 40 mg (has no administration in time range)  insulin aspart (novoLOG) injection 0-5 Units (has no administration in time range)  insulin aspart (novoLOG) injection 0-9 Units (2 Units Subcutaneous Given 02/05/23 1611)  hydrALAZINE (APRESOLINE) injection 5 mg (has no administration in time range)  melatonin tablet 5 mg (has no administration in time range)  guaiFENesin (ROBITUSSIN) 100 MG/5ML liquid 5 mL (has no administration in time range)  vancomycin (VANCOREADY) IVPB 2000 mg/400 mL (2,000 mg Intravenous New Bag/Given 02/05/23 1609)  vancomycin (VANCOREADY) IVPB 1250 mg/250 mL (has no administration in time range)  ceFEPIme (MAXIPIME) 2 g in sodium chloride 0.9 % 100 mL IVPB (has no administration in time range)  oxyCODONE (Oxy IR/ROXICODONE) immediate release tablet 5 mg (has no administration in time range)  morphine (PF) 4 MG/ML injection 4 mg (has no administration in time range)  insulin NPH Human (NOVOLIN N)  injection 30 Units (has no administration in time range)  insulin NPH Human (NOVOLIN N) injection 60 Units (has no administration in time range)  ceFEPIme (MAXIPIME) 2 g in sodium chloride 0.9 % 100 mL IVPB (0 g Intravenous Stopped 02/05/23 1317)  metroNIDAZOLE (FLAGYL) IVPB 500 mg (500 mg Intravenous New Bag/Given 02/05/23 1359)  morphine (PF) 4 MG/ML injection 4 mg (4 mg Intravenous Given 02/05/23 1223)  metoprolol tartrate (LOPRESSOR) injection 5 mg (5 mg Intravenous Given 02/05/23 1352)     IMPRESSION / MDM / ASSESSMENT AND PLAN / ED COURSE  I reviewed the triage vital signs and the nursing notes.  Differential diagnosis includes, but is not limited to, diabetic foot ulcer, osteomyelitis, cellulitis, A-fib secondary to electrolyte abnormality, acute infection  Patient's presentation is most consistent with acute presentation with potential threat to life  or bodily function.  72 year old male presenting to the emergency department for evaluation of great toe pain found to have ulcer on exam.  Case discussed with Dr. Graciela Husbands.  He does recommend antibiotics, MRI without contrast, hospitalist admission and request the patient's Eliquis is held for anticipated intervention.  Heart rates here variable ranging from 100s to 130s, stable BP.  Meet sepsis criteria, sepsis orders initiated with empiric cefepime, vancomycin, Flagyl.  Ordered for morphine for pain control.  Will give a dose of IV metoprolol here.  Will reach out to hospitalist team to discuss admission.  Case discussed with Dr. Sedalia Muta.  She will evaluate the patient for anticipated admission.     FINAL CLINICAL IMPRESSION(S) / ED DIAGNOSES   Final diagnoses:  Osteomyelitis of toe (HCC)     Rx / DC Orders   ED Discharge Orders     None        Note:  This document was prepared using Dragon voice recognition software and may include unintentional dictation errors.   Trinna Post, MD 02/05/23 [phone]

## 2023-02-05 NOTE — Hospital Course (Addendum)
72 year old male with history of insulin-dependent diabetes mellitus, hypertension, hyperlipidemia, permanent atrial fibrillation on Eliquis, who presents to the emergency department for chief concerns of left foot infection concerning for osteomyelitis.  Patient was sent from outpatient [facility] clinic podiatry.  Vitals in the ED showed T 98.1, respiration rate of 16, heart rate of 130, blood pressure 113/74, SpO2 of 97% on room air.  Serum sodium is 133, potassium 4.1, chloride 98, bicarb 25, BUN of 27, serum creatinine 1.49, eGFR 49, nonfasting blood glucose 157, WBC 13, hemoglobin 15.5, platelets of 322.  Lactic acid is 1.5.  ED treatment: Metoprolol 5 mg IV one-time dose, morphine 4 mg IV, cefepime 2 g IV, metronidazole 500 mg IV, vancomycin.

## 2023-02-05 NOTE — ED Triage Notes (Signed)
Pt referred to ED from [facility] for evaluation of diabetic ulcer on his L greater toe. Pt reports being told of possible need for amputation. Pt reports some serosanguinous drainage. No recent fevers per pt.

## 2023-02-05 NOTE — Assessment & Plan Note (Addendum)
Addendum: received message from nursing : 'patient IV infiltrated during Vanc infusion notified pharmacy they recommended antidote hyaluronidase.' I evaluated Kristopher Oneal at bedside, left arm is diffusely swollen and tight and not consistently with IV infiltration or extravasation Patient does not remember if he too his AM eliquis 5 mg this morning. Right upper extremity US ordered to evaluate for DVT

## 2023-02-05 NOTE — Assessment & Plan Note (Addendum)
MRI: Read as findings consistent with osteomyelitis of the distal first phalanx.  Generalized soft tissue swelling in the great toe with apparent ulceration along the plantar aspect of the distal phalanx.  No well-defined abscess.  Moderate degeneration at the first metatarsophalangeal joint ABI ordered Per podiatry, anticipate OR on 02/07/2023 Continue with broad-spectrum antibiotic Admit to telemetry medical, inpatient

## 2023-02-05 NOTE — Consult Note (Signed)
CODE SEPSIS - PHARMACY COMMUNICATION  **Broad Spectrum Antibiotics should be administered within 1 hour of Sepsis diagnosis**  Time Code Sepsis Called/Page Received: 1153  Antibiotics Ordered: cefepime, vancomycin, and Flagyl  Time of 1st antibiotic administration: 1247  Additional action taken by pharmacy: N/A   Barrie Folk ,PharmD Clinical Pharmacist  02/05/2023  11:59 AM

## 2023-02-05 NOTE — Assessment & Plan Note (Signed)
Diltiazem 300 mg nightly, metoprolol succinate 100 mg nightly, triamterene-hydrochlorothiazide 37-5-25 mg daily resumed Hydralazine 5 mg IV every 6 hours as needed for SBP greater 160, 4 days ordered

## 2023-02-05 NOTE — Assessment & Plan Note (Addendum)
Home long acting insulin resumed on admission AM team to adjust as appropriate as patient will need to be n.p.o. Friday evening in anticipation of podiatry procedure on Saturday, 10/19 NPH 30 units nightly, NPH 50 units in the morning at breakfast resumed Insulin SSI with HS coverage ordered Goal inpatient blood glucose is 140-180

## 2023-02-05 NOTE — Assessment & Plan Note (Addendum)
Undifferentiated Resumed home hydroxychloroquine 200 mg p.o. twice daily Continue outpatient follow-up with rheumatologist

## 2023-02-05 NOTE — ED Notes (Signed)
Pt transported to MRI 

## 2023-02-05 NOTE — Progress Notes (Signed)
L Forearm IV infiltration noted after vancomycin infusion. Pharmacy and MD notified. IV consult placed for new PIV.

## 2023-02-05 NOTE — ED Notes (Signed)
Per [facility] clinic pt needs to stop Eliquis and also needs MRI of L foot and IV ABX.

## 2023-02-05 NOTE — Progress Notes (Signed)
Subjective/Chief Complaint: Patient seen.  No specific complaints.  Patient evaluated earlier today in the office outpatient with increased redness and swelling in his left foot and great toe.  Has had chronic osteomyelitis in his great toe.  Due to the progression decision was made for hospitalization for IV antibiotics as well as MRI to plan for amputation of the left great toe   Objective: Vital signs in last 24 hours: Temp:  [97.9 F (36.6 C)-98.1 F (36.7 C)] 97.9 F (36.6 C) (10/17 1517) Pulse Rate:  [60-130] 60 (10/17 1517) Resp:  [16-20] 20 (10/17 1517) BP: (113-130)/(68-86) 119/68 (10/17 1517) SpO2:  [97 %-100 %] 97 % (10/17 1517) Weight:  [161 kg] 117 kg (10/17 1540)    Intake/Output from previous day: No intake/output data recorded. Intake/Output this shift: No intake/output data recorded.  Full-thickness ulceration on the left hallux probing down to the level of the bone distally.  Significant erythema and edema in the left hallux with some streaking onto the dorsal midfoot.     Lab Results:  Recent Labs    02/05/23 1104  WBC 13.0*  HGB 15.5  HCT 47.4  PLT 322   BMET Recent Labs    02/05/23 1104  NA 133*  K 4.1  CL 98  CO2 25  GLUCOSE 157*  BUN 27*  CREATININE 1.49*  CALCIUM 9.3   PT/INR Recent Labs    02/05/23 1148  LABPROT 15.7*  INR 1.2   ABG No results for input(s): "PHART", "HCO3" in the last 72 hours.  Invalid input(s): "PCO2", "PO2"  Studies/Results: MR FOOT LEFT WO CONTRAST  Result Date: 02/05/2023 CLINICAL DATA:  Diabetic ulcer on left great toe. Serosanguineous drainage. No recent fever. Soft tissue infection suspected. EXAM: MRI OF THE LEFT FOOT WITHOUT CONTRAST TECHNIQUE: Multiplanar, multisequence MR imaging of the left forefoot was performed. No intravenous contrast was administered. COMPARISON:  None Available. FINDINGS: Bones/Joint/Cartilage There is cortical destruction of the head of the distal 1st phalanx, best seen  on the coronal images. Associated decreased T1 and increased T2 marrow signal. No significant interphalangeal joint effusion. The proximal phalanx appears intact. The additional toes appear intact. There are moderate degenerative changes at the 1st metatarsophalangeal joint. The visualized metatarsals appear intact. No significant arthropathy at the Lisfranc joint. Ligaments Intact Lisfranc ligament. The collateral ligaments of the metatarsophalangeal joints appear intact. Muscles and Tendons Generalized forefoot muscular atrophy with mild nonspecific muscular edema. The forefoot tendons appear intact, without significant tenosynovitis. Soft tissues Generalized soft tissue swelling in the great toe with apparent ulceration along the plantar aspect of the distal phalanx. There is a small amount of heterogeneous fluid surrounding the distal phalanx, but no well-defined abscess. No other focal soft tissue abnormalities are identified. IMPRESSION: 1. Findings are consistent with osteomyelitis of the distal 1st phalanx. 2. Generalized soft tissue swelling in the great toe with apparent ulceration along the plantar aspect of the distal phalanx. No well-defined abscess. 3. Moderate degenerative changes at the 1st metatarsophalangeal joint. 4. Generalized forefoot muscular atrophy. Electronically Signed   By: Carey Bullocks M.D.   On: 02/05/2023 15:45   DG Chest 2 View  Result Date: 02/05/2023 CLINICAL DATA:  Diabetic ulcer.  Concern for sepsis. EXAM: CHEST - 2 VIEW COMPARISON:  Chest radiograph dated December 02, 2021. FINDINGS: Stable cardiomegaly. The mediastinal contours are within normal limits. Aortic calcification. Bilateral interstitial prominence, most pronounced at the lung bases. No pneumothorax or pleural effusion. No acute osseous abnormality. IMPRESSION: Cardiomegaly with findings suggestive  of pulmonary vascular congestion. Electronically Signed   By: Hart Robinsons M.D.   On: 02/05/2023 13:29     Anti-infectives: Anti-infectives (From admission, onward)    Start     Dose/Rate Route Frequency Ordered Stop   02/06/23 1800  vancomycin (VANCOREADY) IVPB 1250 mg/250 mL        1,250 mg 166.7 mL/hr over 90 Minutes Intravenous Every 24 hours 02/05/23 1547     02/05/23 2200  ceFEPIme (MAXIPIME) 2 g in sodium chloride 0.9 % 100 mL IVPB        2 g 200 mL/hr over 30 Minutes Intravenous Every 8 hours 02/05/23 1547     02/05/23 1645  vancomycin (VANCOREADY) IVPB 2000 mg/400 mL        2,000 mg 200 mL/hr over 120 Minutes Intravenous  Once 02/05/23 1547     02/05/23 1200  ceFEPIme (MAXIPIME) 2 g in sodium chloride 0.9 % 100 mL IVPB        2 g 200 mL/hr over 30 Minutes Intravenous  Once 02/05/23 1153 02/05/23 1317   02/05/23 1200  metroNIDAZOLE (FLAGYL) IVPB 500 mg        500 mg 100 mL/hr over 60 Minutes Intravenous  Once 02/05/23 1153 02/05/23 1459   02/05/23 1200  vancomycin (VANCOCIN) IVPB 1000 mg/200 mL premix  Status:  Discontinued        1,000 mg 200 mL/hr over 60 Minutes Intravenous  Once 02/05/23 1153 02/05/23 1547       Assessment/Plan: s/p * No surgery found * Assessment: 1.  Chronic osteomyelitis with full-thickness ulceration left hallux.  2.  Diabetes with associated neuropathy. 3.  Cellulitis left hallux and foot.  Plan: Discussed with the patient and his wife the need for amputation of his left great toe as we had previously discussed this morning.  Discussed possible risks and complications of the procedure including but not limited to inability of the wound to heal due to his diabetes, continued infection, or circulation.  Could experience some phantom pain.  No guarantees could be given as to the outcome of surgery.  We will obtain consent for partial amputation of the left great toe.  Patient will be n.p.o. after midnight tomorrow night.  At this point we will plan for surgery on Saturday morning to give him another day for the Eliquis to have a chance to get out of his  system some.  Plan to proceed with surgery on Saturday  LOS: 0 days    Ricci Barker 02/05/2023

## 2023-02-05 NOTE — Assessment & Plan Note (Addendum)
Home metoprolol and diltiazem resumed Metoprolol tartrate injection 5 mg IV every 2 hours as needed for heart rate > 120, 2 doses ordered Will hold home Eliquis in anticipation of podiatry procedure

## 2023-02-06 ENCOUNTER — Encounter: Payer: Self-pay | Admitting: Anesthesiology

## 2023-02-06 DIAGNOSIS — M86272 Subacute osteomyelitis, left ankle and foot: Secondary | ICD-10-CM | POA: Diagnosis not present

## 2023-02-06 LAB — BASIC METABOLIC PANEL
Anion gap: 12 (ref 5–15)
BUN: 32 mg/dL — ABNORMAL HIGH (ref 8–23)
CO2: 20 mmol/L — ABNORMAL LOW (ref 22–32)
Calcium: 8.6 mg/dL — ABNORMAL LOW (ref 8.9–10.3)
Chloride: 101 mmol/L (ref 98–111)
Creatinine, Ser: 1.54 mg/dL — ABNORMAL HIGH (ref 0.61–1.24)
GFR, Estimated: 47 mL/min — ABNORMAL LOW (ref >60)
Glucose, Bld: 170 mg/dL — ABNORMAL HIGH (ref 70–99)
Potassium: 3.7 mmol/L (ref 3.5–5.1)
Sodium: 133 mmol/L — ABNORMAL LOW (ref 135–145)

## 2023-02-06 LAB — CBC
HCT: 40.9 % (ref 39.0–52.0)
Hemoglobin: 13.4 g/dL (ref 13.0–17.0)
MCH: 30.9 pg (ref 26.0–34.0)
MCHC: 32.8 g/dL (ref 30.0–36.0)
MCV: 94.5 fL (ref 80.0–100.0)
Platelets: 250 10*3/uL (ref 150–400)
RBC: 4.33 MIL/uL (ref 4.22–5.81)
RDW: 13.5 % (ref 11.5–15.5)
WBC: 12.4 10*3/uL — ABNORMAL HIGH (ref 4.0–10.5)
nRBC: 0 % (ref 0.0–0.2)

## 2023-02-06 LAB — GLUCOSE, CAPILLARY
Glucose-Capillary: 128 mg/dL — ABNORMAL HIGH (ref 70–99)
Glucose-Capillary: 141 mg/dL — ABNORMAL HIGH (ref 70–99)
Glucose-Capillary: 117 mg/dL — ABNORMAL HIGH (ref 70–99)
Glucose-Capillary: 206 mg/dL — ABNORMAL HIGH (ref 70–99)

## 2023-02-06 LAB — HEMOGLOBIN A1C
Hgb A1c MFr Bld: 7.8 % — ABNORMAL HIGH (ref 4.8–5.6)
Mean Plasma Glucose: 177.16 mg/dL

## 2023-02-06 MED ORDER — SODIUM CHLORIDE 0.9 % IV SOLN
2.0000 g | Freq: Two times a day (BID) | INTRAVENOUS | Status: DC
  Administered 2023-02-06 – 2023-02-07 (×2): 2 g via INTRAVENOUS
  Filled 2023-02-06 (×2): qty 12.5

## 2023-02-06 MED ORDER — ENOXAPARIN SODIUM 60 MG/0.6ML IJ SOSY
60.0000 mg | PREFILLED_SYRINGE | INTRAMUSCULAR | Status: DC
  Administered 2023-02-07: 60 mg via SUBCUTANEOUS
  Filled 2023-02-06: qty 0.6

## 2023-02-06 MED ORDER — METRONIDAZOLE 500 MG/100ML IV SOLN
500.0000 mg | Freq: Two times a day (BID) | INTRAVENOUS | Status: DC
  Administered 2023-02-06 – 2023-02-11 (×9): 500 mg via INTRAVENOUS
  Filled 2023-02-06 (×11): qty 100

## 2023-02-06 MED ORDER — VANCOMYCIN HCL 1750 MG/350ML IV SOLN
1750.0000 mg | INTRAVENOUS | Status: DC
  Administered 2023-02-06: 1750 mg via INTRAVENOUS
  Filled 2023-02-06: qty 350

## 2023-02-06 NOTE — Progress Notes (Signed)
Pharmacy Antibiotic Note  Kristopher Oneal is a 72 y.o. male admitted on 02/05/2023 with  osteomyelitis .  Pharmacy has been consulted for Cefepime and Vancomycin dosing.  Plan: Change vancomycin to 1750 mg IV every 24 hours Estimated AUC 461.5, Cmin 11.7 IBW, Scr 1.54, Vd 0.72 Change cefepime to 2 grams IV every 12 hours Vancomycin levels as clinically indicated Follow renal function and cultures for adjustments  Height: 6\' 2"  (188 cm) Weight: 117 kg (258 lb) IBW/kg (Calculated) : 82.2  Temp (24hrs), Avg:98.3 F (36.8 C), Min:97.3 F (36.3 C), Max:100.3 F (37.9 C)  Recent Labs  Lab 02/05/23 1104 02/06/23 0523  WBC 13.0* 12.4*  CREATININE 1.49* 1.54*  LATICACIDVEN 1.4  --     Estimated Creatinine Clearance: 57.2 mL/min (A) (by C-G formula based on SCr of 1.54 mg/dL (H)).    Allergies  Allergen Reactions   Gabapentin Other (See Comments)    Confusion    Antimicrobials this admission: Cefepime 10/17 >>  Vancomycin 10/17 >>   Dose adjustments this admission: Vancomycin 1250 q24h>>1750 q24h Cefepime 2g q8h>>2g q12h  Microbiology results: 10/17 bcx: ngtd  Thank you for allowing pharmacy to be a part of this patient's care.  Barrie Folk, PharmD 02/06/2023 10:01 AM

## 2023-02-06 NOTE — Progress Notes (Signed)
  PROGRESS NOTE    Kristopher Oneal  OVF:[id number] DOB: 1950/12/09 DOA: 02/05/2023 PCP: Danella Penton, MD  [id number]  LOS: 1 day   Brief hospital course:   Assessment & Plan: 72 year old male with history of insulin-dependent diabetes mellitus, hypertension, hyperlipidemia, permanent atrial fibrillation on Eliquis, who presents to the emergency department for chief concerns of left foot infection concerning for osteomyelitis.    * Foot osteomyelitis, left (HCC) MRI: Read as findings consistent with osteomyelitis of the distal first phalanx.  Generalized soft tissue swelling in the great toe with apparent ulceration along the plantar aspect of the distal phalanx.  No well-defined abscess.   --started on Vanc/cefe/flagyl on presentation --ABI abnormal.  Need angiogram before Left Hallux Amputation. Plan: --angiogram tentatively planned for next Tuesday --Left Hallux Amputation after vascular intervention --cont vanc/cefe/flagyl for now  PAD  --ABI abnormal on the left --vascular surgery consullted --angiogram tentatively planned for next Tuesday --cont statin  Chronic atrial fibrillation (HCC) --Eliquis held on presentation in anticipation of surgery --cont metoprolol and diltiazem   HTN (hypertension) --cont Diltiazem 300 mg nightly, metoprolol succinate 100 mg nightly --hold triamterene-hydrochlorothiazide 37-5-25 mg daily   Left arm swelling --RN noted patient IV infiltrated during Vanc infusion.  Right upper extremity US neg for DVT  Inflammatory arthritis Undifferentiated --cont home hydroxychloroquine 200 mg p.o. twice daily Continue outpatient follow-up with rheumatologist  Hyperlipidemia, mixed --cont Home pravastatin 40 mg daily  DM type 2 with diabetic mixed hyperlipidemia (HCC) --cont Novolin 60u daily and 30u nightly --ACHS and SSI   DVT prophylaxis: Lovenox SQ Code Status: Full code  Family Communication: wife updated at bedside today Level of care:  Telemetry Medical Dispo:   The patient is from: home Anticipated d/c is to: home Anticipated d/c date is: >3 days Patient currently is not medically ready to d/c due to: pending toe amputation   Subjective and Interval History:  No pain in the foot.   Due to abnormal ABI, pt needs angiogram before surgery.  Angiogram wasn't able to be added on today.   Objective: Vitals:   02/06/23 0729 02/06/23 0741 02/06/23 1556 02/06/23 2203  BP: (!) 139/113 107/83 114/73 109/72  Pulse: 64 96 99 98  Resp: 20 20 18    Temp: 98.8 F (37.1 C) 98 F (36.7 C) 98.8 F (37.1 C)   TempSrc: Oral Oral Oral   SpO2: 91% 95% 94%   Weight:      Height:        Intake/Output Summary (Last 24 hours) at 02/06/2023 2253 Last data filed at 02/06/2023 1922 Gross per 24 hour  Intake 120 ml  Output 550 ml  Net -430 ml   Filed Weights   02/05/23 1540  Weight: 117 kg    Examination:   Constitutional: NAD, AAOx3 HEENT: conjunctivae and lids normal, EOMI CV: No cyanosis.   RESP: normal respiratory effort, on RA Neuro: II - XII grossly intact.   Psych: Normal mood and affect.  Appropriate judgement and reason   Data Reviewed: I have personally reviewed labs and imaging studies  Time spent: 50 minutes  Darlin Priestly, MD [facility] If 7PM-7AM, please contact night-coverage 02/06/2023, 10:53 PM

## 2023-02-06 NOTE — Progress Notes (Signed)
PHARMACIST - PHYSICIAN COMMUNICATION  CONCERNING:  Enoxaparin (Lovenox) for DVT Prophylaxis    RECOMMENDATION: Patient was prescribed enoxaprin 40mg  q24 hours for VTE prophylaxis.   Filed Weights   02/05/23 1540  Weight: 117 kg (258 lb)    Body mass index is 33.13 kg/m.  Estimated Creatinine Clearance: 57.2 mL/min (A) (by C-G formula based on SCr of 1.54 mg/dL (H)).   Based on [facility] policy patient is candidate for enoxaparin 0.5mg /kg TBW SQ every 24 hours based on BMI being >30.  DESCRIPTION: Pharmacy has adjusted enoxaparin dose per [facility] policy.  Patient is now receiving enoxaparin 0.5 mg/kg every 24 hours   Otelia Sergeant, PharmD, [facility] 02/06/2023 11:16 PM

## 2023-02-06 NOTE — Progress Notes (Signed)
Subjective/Chief Complaint: Patient seen.  No complaints today.   Objective: Vital signs in last 24 hours: Temp:  [97.3 F (36.3 C)-100.3 F (37.9 C)] 98 F (36.7 C) (10/18 0741) Pulse Rate:  [60-147] 96 (10/18 0741) Resp:  [16-20] 20 (10/18 0741) BP: (107-144)/(66-113) 107/83 (10/18 0741) SpO2:  [91 %-100 %] 95 % (10/18 0741) Weight:  [191 kg] 117 kg (10/17 1540) Last BM Date : 02/04/23  Intake/Output from previous day: 10/17 0701 - 10/18 0700 In: -  Out: 350 [Urine:350] Intake/Output this shift: No intake/output data recorded.  Erythema and edema in the left foot and hallux is improved as compared to yesterday.  Distal ulceration bandaged.  Lab Results:  Recent Labs    02/05/23 1104 02/06/23 0523  WBC 13.0* 12.4*  HGB 15.5 13.4  HCT 47.4 40.9  PLT 322 250   BMET Recent Labs    02/05/23 1104 02/06/23 0523  NA 133* 133*  K 4.1 3.7  CL 98 101  CO2 25 20*  GLUCOSE 157* 170*  BUN 27* 32*  CREATININE 1.49* 1.54*  CALCIUM 9.3 8.6*   PT/INR Recent Labs    02/05/23 1148  LABPROT 15.7*  INR 1.2   ABG No results for input(s): "PHART", "HCO3" in the last 72 hours.  Invalid input(s): "PCO2", "PO2"  Studies/Results: US ARTERIAL ABI (SCREENING LOWER EXTREMITY)  Result Date: 02/06/2023 CLINICAL DATA:  Peripheral artery disease EXAM: NONINVASIVE PHYSIOLOGIC VASCULAR STUDY OF BILATERAL LOWER EXTREMITIES TECHNIQUE: Evaluation of both lower extremities were performed at rest, including calculation of ankle-brachial indices with single level Doppler, pressure and pulse volume recording. COMPARISON:  None Available. FINDINGS: Right ABI:  1.1 Left ABI:  0.75 Right Lower Extremity:  Normal arterial waveforms at the ankle. Left Lower Extremity:  Diminished biphasic arterial waveforms. 0.5-0.79 Moderate PAD IMPRESSION: 1. Abnormal resting left ankle-brachial index and arterial waveforms consistent with at least moderate underlying peripheral arterial disease. 2. The  resting right ankle-brachial index and arterial waveforms are normal. Electronically Signed   By: Malachy Moan M.D.   On: 02/06/2023 06:59   US Venous Img Upper Uni Left (DVT)  Result Date: 02/06/2023 CLINICAL DATA:  Left arm swelling EXAM: LEFT UPPER EXTREMITY VENOUS DOPPLER ULTRASOUND TECHNIQUE: Gray-scale sonography with graded compression, as well as color Doppler and duplex ultrasound were performed to evaluate the upper extremity deep venous system from the level of the subclavian vein and including the jugular, axillary, basilic, radial, ulnar and upper cephalic vein. Spectral Doppler was utilized to evaluate flow at rest and with distal augmentation maneuvers. COMPARISON:  None Available. FINDINGS: Contralateral Subclavian Vein: Respiratory phasicity is normal and symmetric with the symptomatic side. No evidence of thrombus. Normal compressibility. Internal Jugular Vein: No evidence of thrombus. Normal compressibility, respiratory phasicity and response to augmentation. Subclavian Vein: No evidence of thrombus. Normal compressibility, respiratory phasicity and response to augmentation. Axillary Vein: No evidence of thrombus. Normal compressibility, respiratory phasicity and response to augmentation. Cephalic Vein: No evidence of thrombus. Normal compressibility, respiratory phasicity and response to augmentation. Basilic Vein: No evidence of thrombus. Normal compressibility, respiratory phasicity and response to augmentation. Brachial Veins: No evidence of thrombus. Normal compressibility, respiratory phasicity and response to augmentation. Radial Veins: No evidence of thrombus. Normal compressibility, respiratory phasicity and response to augmentation. Ulnar Veins: No evidence of thrombus. Normal compressibility, respiratory phasicity and response to augmentation. Venous Reflux:  None visualized. Other Findings: Edema seen within the subcutaneous soft tissues of the left forearm. IMPRESSION: No  evidence of DVT within the  left upper extremity. Electronically Signed   By: Charlett Nose M.D.   On: 02/06/2023 00:23   MR FOOT LEFT WO CONTRAST  Result Date: 02/05/2023 CLINICAL DATA:  Diabetic ulcer on left great toe. Serosanguineous drainage. No recent fever. Soft tissue infection suspected. EXAM: MRI OF THE LEFT FOOT WITHOUT CONTRAST TECHNIQUE: Multiplanar, multisequence MR imaging of the left forefoot was performed. No intravenous contrast was administered. COMPARISON:  None Available. FINDINGS: Bones/Joint/Cartilage There is cortical destruction of the head of the distal 1st phalanx, best seen on the coronal images. Associated decreased T1 and increased T2 marrow signal. No significant interphalangeal joint effusion. The proximal phalanx appears intact. The additional toes appear intact. There are moderate degenerative changes at the 1st metatarsophalangeal joint. The visualized metatarsals appear intact. No significant arthropathy at the Lisfranc joint. Ligaments Intact Lisfranc ligament. The collateral ligaments of the metatarsophalangeal joints appear intact. Muscles and Tendons Generalized forefoot muscular atrophy with mild nonspecific muscular edema. The forefoot tendons appear intact, without significant tenosynovitis. Soft tissues Generalized soft tissue swelling in the great toe with apparent ulceration along the plantar aspect of the distal phalanx. There is a small amount of heterogeneous fluid surrounding the distal phalanx, but no well-defined abscess. No other focal soft tissue abnormalities are identified. IMPRESSION: 1. Findings are consistent with osteomyelitis of the distal 1st phalanx. 2. Generalized soft tissue swelling in the great toe with apparent ulceration along the plantar aspect of the distal phalanx. No well-defined abscess. 3. Moderate degenerative changes at the 1st metatarsophalangeal joint. 4. Generalized forefoot muscular atrophy. Electronically Signed   By: Carey Bullocks M.D.   On: 02/05/2023 15:45   DG Chest 2 View  Result Date: 02/05/2023 CLINICAL DATA:  Diabetic ulcer.  Concern for sepsis. EXAM: CHEST - 2 VIEW COMPARISON:  Chest radiograph dated December 02, 2021. FINDINGS: Stable cardiomegaly. The mediastinal contours are within normal limits. Aortic calcification. Bilateral interstitial prominence, most pronounced at the lung bases. No pneumothorax or pleural effusion. No acute osseous abnormality. IMPRESSION: Cardiomegaly with findings suggestive of pulmonary vascular congestion. Electronically Signed   By: Hart Robinsons M.D.   On: 02/05/2023 13:29    Anti-infectives: Anti-infectives (From admission, onward)    Start     Dose/Rate Route Frequency Ordered Stop   02/06/23 1800  vancomycin (VANCOREADY) IVPB 1250 mg/250 mL  Status:  Discontinued        1,250 mg 166.7 mL/hr over 90 Minutes Intravenous Every 24 hours 02/05/23 1547 02/06/23 0958   02/06/23 1800  ceFEPIme (MAXIPIME) 2 g in sodium chloride 0.9 % 100 mL IVPB        2 g 200 mL/hr over 30 Minutes Intravenous Every 12 hours 02/06/23 0805     02/06/23 1800  vancomycin (VANCOREADY) IVPB 1750 mg/350 mL        1,750 mg 175 mL/hr over 120 Minutes Intravenous Every 24 hours 02/06/23 0958     02/05/23 2200  ceFEPIme (MAXIPIME) 2 g in sodium chloride 0.9 % 100 mL IVPB  Status:  Discontinued        2 g 200 mL/hr over 30 Minutes Intravenous Every 8 hours 02/05/23 1547 02/06/23 0805   02/05/23 2200  hydroxychloroquine (PLAQUENIL) tablet 200 mg        200 mg Oral 2 times daily 02/05/23 1707     02/05/23 1645  vancomycin (VANCOREADY) IVPB 2000 mg/400 mL        2,000 mg 200 mL/hr over 120 Minutes Intravenous  Once 02/05/23 1547 02/05/23 1809  02/05/23 1200  ceFEPIme (MAXIPIME) 2 g in sodium chloride 0.9 % 100 mL IVPB        2 g 200 mL/hr over 30 Minutes Intravenous  Once 02/05/23 1153 02/05/23 1317   02/05/23 1200  metroNIDAZOLE (FLAGYL) IVPB 500 mg        500 mg 100 mL/hr over 60 Minutes  Intravenous  Once 02/05/23 1153 02/05/23 1459   02/05/23 1200  vancomycin (VANCOCIN) IVPB 1000 mg/200 mL premix  Status:  Discontinued        1,000 mg 200 mL/hr over 60 Minutes Intravenous  Once 02/05/23 1153 02/05/23 1547       Assessment/Plan: s/p Procedure(s): Left Hallux Amputation (Left) Assessment: Chronic osteomyelitis left hallux.  Plan: Dressing left intact.  Discussed with the patient again the plan for surgery tomorrow morning.  Patient is n.p.o. after midnight tonight.  LOS: 1 day    Ricci Barker 02/06/2023

## 2023-02-06 NOTE — Consult Note (Signed)
Hospital Consult    Reason for Consult:  Left Lower extremity with erythema and edema with ulceration  Requesting Physician:  Dr Linus Galas MD MRN #:  [id number]  History of Present Illness: This is a 72 y.o. male with history of A-fib on Eliquis, T2DM, HTN presenting to the emergency department for evaluation of foot wound. Patient reports he has been having ongoing issues with an ulcer of his foot since the beginning of this year. However the last week he has noticed increased redness and pain over this location. He saw his podiatrist in clinic earlier today who recommended ER presentation given worsened appearance for anticipated admission and amputation.   Upon workup patient has had left lower extremity ultrasound with ABIs.  He is 0.75 with biphasic pulses.  Vascular surgery was consulted by podiatry to evaluate.  Past Medical History:  Diagnosis Date   Anxiety    Arrhythmia    atrial fibrillation   Arthritis    CHF (congestive heart failure) (HCC)    Diabetes mellitus without complication (HCC)    type 2   Dysrhythmia    PVC/ a fib   Heart murmur    High cholesterol    Hypertension     Past Surgical History:  Procedure Laterality Date   BACK SURGERY     l5/s1   COLONOSCOPY     KNEE ARTHROPLASTY Right 01/02/2020   Procedure: COMPUTER ASSISTED TOTAL KNEE ARTHROPLASTY;  Surgeon: Donato Heinz, MD;  Location: [facility] ORS;  Service: Orthopedics;  Laterality: Right;   KNEE ARTHROPLASTY Left 11/19/2020   Procedure: COMPUTER ASSISTED TOTAL KNEE ARTHROPLASTY;  Surgeon: Donato Heinz, MD;  Location: [facility] OR[facility];  Service: Orthopedics;  Laterality: Left;   ORIF PATELLA Left 04/16/2021   Procedure: OPEN REDUCTION INTERNAL (ORIF) FIXATION PATELLA;  Surgeon: Donato Heinz, MD;  Location: [facility] ORS;  Service: Orthopedics;  Laterality: Left;   ORIF PATELLA Left 05/10/2021   Procedure: OPEN REDUCTION INTERNAL (ORIF) FIXATION PATELLA;  Surgeon: Donato Heinz, MD;  Location: [facility] ORS;   Service: Orthopedics;  Laterality: Left;    Allergies  Allergen Reactions   Gabapentin Other (See Comments)    Confusion    Prior to Admission medications   Medication Sig Start Date End Date Taking? Authorizing Provider  diltiazem (CARDIZEM CD) 300 MG 24 hr capsule Take 1 capsule (300 mg total) by mouth at bedtime. 06/24/22  Yes Gollan, Tollie Pizza, MD  ELIQUIS 5 MG TABS tablet Take 1 tablet (5 mg total) by mouth 2 (two) times daily. 06/24/22  Yes Antonieta Iba, MD  fenofibrate 160 MG tablet Take 160 mg by mouth daily.  05/27/18  Yes [provider]  fluticasone (FLONASE) 50 MCG/ACT nasal spray Place 1 spray into the nose daily as needed for allergies.   Yes [provider]  glipiZIDE (GLUCOTROL) 5 MG tablet Take 5 mg by mouth 2 (two) times daily.   Yes [provider]  hydroxychloroquine (PLAQUENIL) 200 MG tablet Take 200 mg by mouth 2 (two) times daily.   Yes [provider]  metFORMIN (GLUCOPHAGE) 1000 MG tablet Take 1,000 mg by mouth 2 (two) times daily with a meal.  05/27/18  Yes [provider]  metoprolol succinate (TOPROL-XL) 100 MG 24 hr tablet Take 1 tablet (100 mg total) by mouth at bedtime. 06/24/22  Yes Antonieta Iba, MD  Multiple Vitamins-Minerals (ADVANCED DIABETIC MULTIVITAMIN) TABS Take 1 tablet by mouth daily.   Yes [provider]  sildenafil (VIAGRA) 100 MG  tablet Take 100 mg by mouth daily as needed. 05/06/22  Yes [provider]  triamterene-hydrochlorothiazide (MAXZIDE-25) 37.5-25 MG tablet Take 1 tablet by mouth daily. 06/24/22  Yes Antonieta Iba, MD  venlafaxine XR (EFFEXOR-XR) 37.5 MG 24 hr capsule Take 37.5 mg by mouth daily with breakfast.   Yes [provider]  vitamin B-12 (CYANOCOBALAMIN) 500 MCG tablet Take 1,000 mcg by mouth daily.   Yes [provider]  Continuous Blood Gluc Sensor (FREESTYLE LIBRE 14 DAY SENSOR) MISC SMARTSIG:1 Kit(s) Topical Every 2 Weeks 12/20/21   [provider]  furosemide (LASIX) 20 MG tablet Take 1 tablet (20 mg total) by mouth daily. Patient taking differently: Take 20 mg by mouth as needed for edema. 06/24/22   Antonieta Iba, MD  insulin NPH-regular Human (HUMULIN 70/30) (70-30) 100 UNIT/ML injection Inject 25-50 Units into the skin See admin instructions. Inject 50 units into the skin in the morning and 25 units in the evening    [provider]  lovastatin (MEVACOR) 40 MG tablet Take 1 tablet (40 mg total) by mouth daily with supper. 06/24/22   Antonieta Iba, MD  mupirocin ointment (BACTROBAN) 2 % Apply 1 Application topically daily. 10/23/22   [provider]    Social History   Socioeconomic History   Marital status: Married    Spouse name: Not on file   Number of children: Not on file   Years of education: Not on file   Highest education level: Not on file  Occupational History   Not on file  Tobacco Use   Smoking status: Former    Current packs/day: 0.00    Types: Cigarettes    Quit date: 1980    Years since quitting: 44.8   Smokeless tobacco: Never   Tobacco comments:    1980  Vaping Use   Vaping status: Never Used  Substance and Sexual Activity   Alcohol use: Yes    Comment: rarely   Drug use: Never   Sexual activity: Not Currently  Other Topics Concern   Not on file  Social History Narrative   Not on file   Social Determinants of Health   Financial Resource Strain: Low Risk  (06/10/2022)   [facility]   Overall Financial Resource Strain (CARDIA)    Difficulty of Paying Living Expenses: Not hard at all  Food Insecurity: No Food Insecurity (02/05/2023)   Hunger Vital Sign    Worried About Running Out of Food in the Last Year: Never true    Ran Out of Food in the Last Year: Never true  Transportation Needs: No Transportation Needs (02/05/2023)   PRAPARE - Administrator, Civil Service (Medical): No    Lack  of Transportation (Non-Medical): No  Physical Activity: Not on file  Stress: Not on file  Social Connections: Not on file  Intimate Partner Violence: Not At Risk (02/05/2023)   Humiliation, Afraid, Rape, and Kick questionnaire    Fear of Current or Ex-Partner: No    Emotionally Abused: No    Physically Abused: No    Sexually Abused: No     Family History  Problem Relation Age of Onset   Dementia Mother    Heart attack Father    Emphysema Father    Heart attack Brother     ROS: Otherwise negative unless mentioned in HPI  Physical Examination  Vitals:   02/06/23 0729 02/06/23 0741  BP: Marland Kitchen)  139/113 107/83  Pulse: 64 96  Resp: 20 20  Temp: 98.8 F (37.1 C) 98 F (36.7 C)  SpO2: 91% 95%   Body mass index is 33.13 kg/m.  General:  WDWN in NAD Gait: Not observed HENT: WNL, normocephalic Pulmonary: normal non-labored breathing, without Rales, rhonchi,  wheezing Cardiac: irregular, Hx AFIB, without  Murmurs, rubs or gallops; without carotid bruits Abdomen: Positive bowel sounds throughout, soft, NT/ND, no masses Skin: without rashes Vascular Exam/Pulses: All pulses palpable except left lower extremity. Unable to palpate DP/PT in left Extremities: with ischemic changes, without Gangrene , with cellulitis; with open wounds;  Musculoskeletal: no muscle wasting or atrophy  Neurologic: A&O X 3;  No focal weakness or paresthesias are detected; speech is fluent/normal Psychiatric:  The pt has Normal affect. Lymph:  Unremarkable  CBC    Component Value Date/Time   WBC 12.4 (H) 02/06/2023 0523   RBC 4.33 02/06/2023 0523   HGB 13.4 02/06/2023 0523   HCT 40.9 02/06/2023 0523   PLT 250 02/06/2023 0523   MCV 94.5 02/06/2023 0523   MCH 30.9 02/06/2023 0523   MCHC 32.8 02/06/2023 0523   RDW 13.5 02/06/2023 0523   LYMPHSABS 1.0 02/05/2023 1104   MONOABS 1.3 (H) 02/05/2023 1104   EOSABS 0.0 02/05/2023 1104   BASOSABS 0.1 02/05/2023 1104    BMET    Component Value  Date/Time   NA 133 (L) 02/06/2023 0523   K 3.7 02/06/2023 0523   CL 101 02/06/2023 0523   CO2 20 (L) 02/06/2023 0523   GLUCOSE 170 (H) 02/06/2023 0523   BUN 32 (H) 02/06/2023 0523   CREATININE 1.54 (H) 02/06/2023 0523   CALCIUM 8.6 (L) 02/06/2023 0523   GFRNONAA 47 (L) 02/06/2023 0523   GFRAA >60 12/27/2019 1108    COAGS: Lab Results  Component Value Date   INR 1.2 02/05/2023   INR 1.1 12/02/2021   INR 1.0 11/08/2020     Non-Invasive Vascular Imaging:   EXAM:02/06/23 NONINVASIVE PHYSIOLOGIC VASCULAR STUDY OF BILATERAL LOWER EXTREMITIES   TECHNIQUE: Evaluation of both lower extremities were performed at rest, including calculation of ankle-brachial indices with single level Doppler, pressure and pulse volume recording.   COMPARISON:  None Available.   FINDINGS: Right ABI:  1.1   Left ABI:  0.75   Right Lower Extremity:  Normal arterial waveforms at the ankle.   Left Lower Extremity:  Diminished biphasic arterial waveforms.   0.5-0.79 Moderate PAD   IMPRESSION: 1. Abnormal resting left ankle-brachial index and arterial waveforms consistent with at least moderate underlying peripheral arterial disease. 2. The resting right ankle-brachial index and arterial waveforms are normal.  Statin:  Yes.   Beta Blocker:  No. Aspirin:  No. ACEI:  No. ARB:  No. CCB use:  Yes Other antiplatelets/anticoagulants:  Yes.   Eliquis 5 mg twice daily   ASSESSMENT/PLAN: This is a 72 y.o. male who presents to [facility] ER with left foot/Great toe nonhealing ulcer. Patient was seen by Dr Alberteen Spindle podiatry and plan is for amputation of great toe tomorrow 02/07/23. Patient had Ultrasound with ABI of left lower extremity which has worsened since 11/2022.   Vascular Surgery after examining the patient will take the patient to the vascular lab for left lower angiogram with possible intervention later today 02/06/23. I discussed in detail with the patient and wife at the bedside the procedure,  benefits, risks and complications. Both verbalized their understanding and wish to proceed today. I answered all their questions today. Patient did eat breakfast  at 7:30 am today but has been NPO since then.     -I discussed the plan in detail with Dr. Vilinda Flake MD and he agrees with the plan.    Marcie [facility] 02/06/2023 11:50 AM

## 2023-02-06 NOTE — Plan of Care (Signed)
[facility] Tonsillectomy/Adenoidectomy, Postoperative PEDS care plan entered in error.

## 2023-02-06 NOTE — Progress Notes (Signed)
Pt observed lying down on the bathroom floor by the writer. Got up on his own and was followed by the first nurse available on the station who saw him walking on his own to the bathroom when the bed alarm went off. Pt was assessed, no signs of injuries noted. Pt denied any pain/problems. Denies hitting his head. Denies any chest pain. Pt stated that he slid to the floor when he was trying to get off from the toilet bowl. Pt assisted to bed with help of nursing staff members. VS, BP 137/71, HR 147, Oxygen 94, and respiration 18. Hospitalist/ Jan, Mansy-MD made notified of the  fall and also elevated heart rate. Nonskid socks on, floor mat in placed, bed alarm on, call light button within reach. Personal belongings on the bedside table within reach.  Will continue to monitor.  Post huddle done and [facility] made aware by Charge Nurse.

## 2023-02-07 ENCOUNTER — Other Ambulatory Visit: Payer: Self-pay

## 2023-02-07 DIAGNOSIS — M86272 Subacute osteomyelitis, left ankle and foot: Secondary | ICD-10-CM | POA: Diagnosis not present

## 2023-02-07 LAB — BASIC METABOLIC PANEL
Anion gap: 10 (ref 5–15)
BUN: 26 mg/dL — ABNORMAL HIGH (ref 8–23)
CO2: 22 mmol/L (ref 22–32)
Calcium: 8.8 mg/dL — ABNORMAL LOW (ref 8.9–10.3)
Chloride: 102 mmol/L (ref 98–111)
Creatinine, Ser: 1.17 mg/dL (ref 0.61–1.24)
GFR, Estimated: >60 mL/min (ref >60)
Glucose, Bld: 121 mg/dL — ABNORMAL HIGH (ref 70–99)
Potassium: 3.5 mmol/L (ref 3.5–5.1)
Sodium: 134 mmol/L — ABNORMAL LOW (ref 135–145)

## 2023-02-07 LAB — CBC
HCT: 39 % (ref 39.0–52.0)
Hemoglobin: 13.4 g/dL (ref 13.0–17.0)
MCH: 31.2 pg (ref 26.0–34.0)
MCHC: 34.4 g/dL (ref 30.0–36.0)
MCV: 90.9 fL (ref 80.0–100.0)
Platelets: 280 10*3/uL (ref 150–400)
RBC: 4.29 MIL/uL (ref 4.22–5.81)
RDW: 13.5 % (ref 11.5–15.5)
WBC: 10 10*3/uL (ref 4.0–10.5)
nRBC: 0 % (ref 0.0–0.2)

## 2023-02-07 LAB — GLUCOSE, CAPILLARY
Glucose-Capillary: 100 mg/dL — ABNORMAL HIGH (ref 70–99)
Glucose-Capillary: 152 mg/dL — ABNORMAL HIGH (ref 70–99)
Glucose-Capillary: 219 mg/dL — ABNORMAL HIGH (ref 70–99)
Glucose-Capillary: 197 mg/dL — ABNORMAL HIGH (ref 70–99)

## 2023-02-07 LAB — MAGNESIUM: Magnesium: 2.1 mg/dL (ref 1.7–2.4)

## 2023-02-07 MED ORDER — ENOXAPARIN SODIUM 120 MG/0.8ML IJ SOSY
115.0000 mg | PREFILLED_SYRINGE | Freq: Two times a day (BID) | INTRAMUSCULAR | Status: DC
  Administered 2023-02-07 – 2023-02-11 (×7): 115 mg via SUBCUTANEOUS
  Filled 2023-02-07 (×8): qty 0.76

## 2023-02-07 MED ORDER — VANCOMYCIN HCL 2000 MG/400ML IV SOLN
2000.0000 mg | INTRAVENOUS | Status: DC
  Administered 2023-02-07 – 2023-02-08 (×2): 2000 mg via INTRAVENOUS
  Filled 2023-02-07 (×4): qty 400

## 2023-02-07 MED ORDER — SODIUM CHLORIDE 0.9 % IV SOLN
2.0000 g | Freq: Three times a day (TID) | INTRAVENOUS | Status: DC
  Administered 2023-02-07 – 2023-02-11 (×11): 2 g via INTRAVENOUS
  Filled 2023-02-07 (×13): qty 12.5

## 2023-02-07 NOTE — Progress Notes (Signed)
PROGRESS NOTE  Kristopher Oneal  DOB: 08/19/1950  PCP: Danella Penton, MD YNW:[id number]  DOA: 02/05/2023  LOS: 2 days  Hospital Day: 3  Brief narrative: Kristopher Oneal is a 72 y.o. male with PMH significant for DM2, HTN, HLD, permanent A-fib on Eliquis, CHF, arthritis, anxiety 10/17, patient was sent to the ED from [facility] clinic podiatry with complaint of left foot infection concerning for osteomyelitis.  MRI left foot showed osteomyelitis of the distal first phalanx.  Generalized soft tissue swelling in the great toe with apparent ulceration along the plantar aspect of the distal phalanx.  No well-defined abscess.   Admitted to [facility] ABI showed abnormal resting left ankle-brachial index consistent with at least moderate underlying PAD Seen by podiatry and vascular surgery.  Subjective: Patient was seen and examined this morning.  Pleasant elderly Caucasian male.  Lying on bed.  Not in distress.  Pain controlled.  Family not at bedside. In the last 24 hours, Afebrile, hemodynamically stable Labs from this morning with unremarkable CBC, BMP with sodium 134  Assessment and plan: Acute osteomyelitis of left distal first phalanx  Not septic. MRI and ABI findings as above Podiatry and vascular surgery following Tentative plan of angiogram on Tuesday followed by left hallux amputation Currently on broad-spectrum IV antibiotic coverage with vanc/cefe/flagyl Recent Labs  Lab 02/05/23 1104 02/06/23 0523 02/07/23 0450  WBC 13.0* 12.4* 10.0  LATICACIDVEN 1.4  --   --    PAD  Left ABI abnormal angiogram tentatively planned for next Tuesday cont statin.  Eliquis on hold in anticipation of surgery  Type 2 diabetes mellitus with hyperglycemia A1c 7.8 on 02/06/2023 PTA meds-70/30 Novolin 60 units daily and 30 units nightly, metformin 1000 mg twice daily, glipizide 5 mg twice daily, Currently continued on the same.  Continue SSI/Accu-Cheks Recent Labs  Lab 02/06/23 0749 02/06/23 1155  02/06/23 1747 02/06/23 2202 02/07/23 0827  GLUCAP 128* 141* 117* 206* 100*    HTN PTA meds-Cardizem, Toprol, triamterene 37.5 mg daily, HCTZ 25 mg daily, Lasix 20 mg daily as needed Continue Cardizem, Toprol, triamterene-HCTZ. Blood pressure controlled IV hydralazine as needed   Chronic atrial fibrillation Rate controlled on Cardizem and Toprol. Chronically anticoagulated on Eliquis 5 mg twice daily.  Currently on hold in anticipation of surgery  HLD Lovastatin, fenofibrate   Left arm swelling 10/17, RN noted patient IV infiltrated during Vanc infusion.  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 twice daily Continue outpatient follow-up with rheumatologist  Anxiety Continue Effexor    Mobility: Previously independent.  Patient had a near fall in the bathroom the other day due to loss of balance.  He is currently on fall precautions.  I will order PT eval  Goals of care   Code Status: Full Code     DVT prophylaxis:  Place TED hose Start: 02/05/23 1415   Antimicrobials: Vancomycin, cefepime, Flagyl Fluid: None Consultants: Vascular surgery, podiatry Family Communication: None at bedside  Status: Inpatient Level of care:  Telemetry Medical   Patient is from: Home Needs to continue in-hospital care: Pending angiogram and surgery, currently on IV antibiotics Anticipated d/c to: Pending clinical course      Diet:  Diet Order             Diet heart healthy/carb modified Room service appropriate? Yes; Fluid consistency: Thin  Diet effective now  Scheduled Meds:  cyanocobalamin  1,000 mcg Oral Daily   diltiazem  300 mg Oral QHS   enoxaparin (LOVENOX) injection  60 mg Subcutaneous Q24H   hydroxychloroquine  200 mg Oral BID   insulin aspart  0-5 Units Subcutaneous QHS   insulin aspart  0-9 Units Subcutaneous TID WC   insulin NPH Human  30 Units Subcutaneous  QHS   insulin NPH Human  60 Units Subcutaneous QAC breakfast   metoprolol succinate  100 mg Oral QHS   metoprolol tartrate  2.5 mg Intravenous Once   multivitamin with minerals  1 tablet Oral Daily   pravastatin  40 mg Oral q1800   triamterene-hydrochlorothiazide  1 tablet Oral Daily   venlafaxine XR  37.5 mg Oral Q breakfast    PRN meds: acetaminophen **OR** acetaminophen, fluticasone, guaiFENesin, hydrALAZINE, melatonin, metoprolol tartrate, ondansetron **OR** ondansetron (ZOFRAN) IV, senna-docusate   Infusions:   ceFEPime (MAXIPIME) IV     metronidazole 500 mg (02/06/23 2333)   vancomycin      Antimicrobials: Anti-infectives (From admission, onward)    Start     Dose/Rate Route Frequency Ordered Stop   02/07/23 1800  vancomycin (VANCOREADY) IVPB 2000 mg/400 mL        2,000 mg 200 mL/hr over 120 Minutes Intravenous Every 24 hours 02/07/23 0707     02/07/23 1400  ceFEPIme (MAXIPIME) 2 g in sodium chloride 0.9 % 100 mL IVPB        2 g 200 mL/hr over 30 Minutes Intravenous Every 8 hours 02/07/23 0700     02/06/23 1800  vancomycin (VANCOREADY) IVPB 1250 mg/250 mL  Status:  Discontinued        1,250 mg 166.7 mL/hr over 90 Minutes Intravenous Every 24 hours 02/05/23 1547 02/06/23 0958   02/06/23 1800  ceFEPIme (MAXIPIME) 2 g in sodium chloride 0.9 % 100 mL IVPB  Status:  Discontinued        2 g 200 mL/hr over 30 Minutes Intravenous Every 12 hours 02/06/23 0805 02/07/23 0700   02/06/23 1800  vancomycin (VANCOREADY) IVPB 1750 mg/350 mL  Status:  Discontinued        1,750 mg 175 mL/hr over 120 Minutes Intravenous Every 24 hours 02/06/23 0958 02/07/23 0707   02/06/23 1230  metroNIDAZOLE (FLAGYL) IVPB 500 mg        500 mg 100 mL/hr over 60 Minutes Intravenous Every 12 hours 02/06/23 1140     02/05/23 2200  ceFEPIme (MAXIPIME) 2 g in sodium chloride 0.9 % 100 mL IVPB  Status:  Discontinued        2 g 200 mL/hr over 30 Minutes Intravenous Every 8 hours 02/05/23 1547 02/06/23 0805    02/05/23 2200  hydroxychloroquine (PLAQUENIL) tablet 200 mg        200 mg Oral 2 times daily 02/05/23 1707     02/05/23 1645  vancomycin (VANCOREADY) IVPB 2000 mg/400 mL        2,000 mg 200 mL/hr over 120 Minutes Intravenous  Once 02/05/23 1547 02/05/23 1809   02/05/23 1200  ceFEPIme (MAXIPIME) 2 g in sodium chloride 0.9 % 100 mL IVPB        2 g 200 mL/hr over 30 Minutes Intravenous  Once 02/05/23 1153 02/05/23 1317   02/05/23 1200  metroNIDAZOLE (FLAGYL) IVPB 500 mg        500 mg 100 mL/hr over 60 Minutes Intravenous  Once 02/05/23 1153 02/05/23 1459   02/05/23 1200  vancomycin (VANCOCIN) IVPB 1000 mg/200 mL premix  Status:  Discontinued        1,000 mg 200 mL/hr over 60 Minutes Intravenous  Once 02/05/23 1153 02/05/23 1547       Objective: Vitals:   02/06/23 2330 02/07/23 0809  BP: 110/69 113/60  Pulse: 99 88  Resp: 17 15  Temp: 98.1 F (36.7 C) (!) 97.5 F (36.4 C)  SpO2: 96% 98%    Intake/Output Summary (Last 24 hours) at 02/07/2023 1058 Last data filed at 02/07/2023 0304 Gross per 24 hour  Intake 120 ml  Output 1100 ml  Net -980 ml   Filed Weights   02/05/23 1540  Weight: 117 kg   Weight change:  Body mass index is 33.13 kg/m.   Physical Exam: General exam: Pleasant, elderly.  Not in pain currently Skin: No rashes, lesions or ulcers. HEENT: Atraumatic, normocephalic, no obvious bleeding Lungs: Clear to auscultation bilaterally CVS: Regular rate and rhythm, no murmur GI/Abd soft, nontender, nondistended, bowel sound present CNS: Alert, awake, oriented x 3 Psychiatry: Mood appropriate Extremities: No pedal edema, no calf tenderness.  Left foot wound wrapped in bandage  Data Review: I have personally reviewed the laboratory data and studies available.  F/u labs ordered Unresulted Labs (From admission, onward)     Start     Ordered   02/07/23 0500  Basic metabolic panel  Daily,   R      02/06/23 1533   02/07/23 0500  CBC  Daily,   R      02/06/23 1533    02/07/23 0500  Magnesium  Daily,   R      02/06/23 1533           Total time spent in review of labs and imaging, patient evaluation, formulation of plan, documentation and communication with family: 45 minutes  Signed, Lorin Glass, MD [facility] 02/07/2023

## 2023-02-07 NOTE — Progress Notes (Signed)
Pharmacy Antibiotic Note  Kristopher Oneal is a 72 y.o. male admitted on 02/05/2023 with  osteomyelitis .  Pharmacy has been consulted for Cefepime and Vancomycin dosing.  Plan: Change vancomycin to 2000 mg IV every 24 hours BMI 33, between Vd 0.5 and 0.72. Scr today 1.17 which is more c/w baseline With Vd 0.72 eAUC: 410, Cmin 8.9 With Vd 0.5 eAUC 590, Cmin 12.8 Change cefepime to 2 grams IV every 8 hours Vancomycin levels as clinically indicated Follow renal function and cultures for adjustments  Height: 6\' 2"  (188 cm) Weight: 117 kg (258 lb) IBW/kg (Calculated) : 82.2  Temp (24hrs), Avg:98.4 F (36.9 C), Min:98 F (36.7 C), Max:98.8 F (37.1 C)  Recent Labs  Lab 02/05/23 1104 02/06/23 0523 02/07/23 0450  WBC 13.0* 12.4* 10.0  CREATININE 1.49* 1.54* 1.17  LATICACIDVEN 1.4  --   --     Estimated Creatinine Clearance: 75.3 mL/min (by C-G formula based on SCr of 1.17 mg/dL).    Allergies  Allergen Reactions   Gabapentin Other (See Comments)    Confusion    Antimicrobials this admission: Cefepime 10/17 >>  Vancomycin 10/17 >>   Dose adjustments this admission: Vancomycin 1250 q24h >> 1750 q24h >> 2000 mg q24h Cefepime 2g q8h>>2g q12h>>2g q8h  Microbiology results: 10/17 Bcx: NGTD  Thank you for allowing pharmacy to be a part of this patient's care.  Tressie Ellis 02/07/2023 7:02 AM

## 2023-02-07 NOTE — Progress Notes (Signed)
Subjective/Chief Complaint: Patient seen.  No significant complaints.   Objective: Vital signs in last 24 hours: Temp:  [97.5 F (36.4 C)-98.8 F (37.1 C)] 97.5 F (36.4 C) (10/19 0809) Pulse Rate:  [88-99] 88 (10/19 0809) Resp:  [15-18] 15 (10/19 0809) BP: (109-114)/(60-73) 113/60 (10/19 0809) SpO2:  [94 %-98 %] 98 % (10/19 0809) Last BM Date : 02/04/23  Intake/Output from previous day: 10/18 0701 - 10/19 0700 In: 120 [P.O.:120] Out: 1100 [Urine:1100] Intake/Output this shift: No intake/output data recorded.  Still some erythema in the hallux and distal forefoot but improved.  Purulent drainage noted on his bandaging.  Continued full-thickness ulceration distal hallux.  Lab Results:  Recent Labs    02/06/23 0523 02/07/23 0450  WBC 12.4* 10.0  HGB 13.4 13.4  HCT 40.9 39.0  PLT 250 280   BMET Recent Labs    02/06/23 0523 02/07/23 0450  NA 133* 134*  K 3.7 3.5  CL 101 102  CO2 20* 22  GLUCOSE 170* 121*  BUN 32* 26*  CREATININE 1.54* 1.17  CALCIUM 8.6* 8.8*   PT/INR Recent Labs    02/05/23 1148  LABPROT 15.7*  INR 1.2   ABG No results for input(s): "PHART", "HCO3" in the last 72 hours.  Invalid input(s): "PCO2", "PO2"  Studies/Results: US ARTERIAL ABI (SCREENING LOWER EXTREMITY)  Result Date: 02/06/2023 CLINICAL DATA:  Peripheral artery disease EXAM: NONINVASIVE PHYSIOLOGIC VASCULAR STUDY OF BILATERAL LOWER EXTREMITIES TECHNIQUE: Evaluation of both lower extremities were performed at rest, including calculation of ankle-brachial indices with single level Doppler, pressure and pulse volume recording. COMPARISON:  None Available. FINDINGS: Right ABI:  1.1 Left ABI:  0.75 Right Lower Extremity:  Normal arterial waveforms at the ankle. Left Lower Extremity:  Diminished biphasic arterial waveforms. 0.5-0.79 Moderate PAD IMPRESSION: 1. Abnormal resting left ankle-brachial index and arterial waveforms consistent with at least moderate underlying peripheral  arterial disease. 2. The resting right ankle-brachial index and arterial waveforms are normal. Electronically Signed   By: Malachy Moan M.D.   On: 02/06/2023 06:59   US Venous Img Upper Uni Left (DVT)  Result Date: 02/06/2023 CLINICAL DATA:  Left arm swelling EXAM: LEFT UPPER EXTREMITY VENOUS DOPPLER ULTRASOUND TECHNIQUE: Gray-scale sonography with graded compression, as well as color Doppler and duplex ultrasound were performed to evaluate the upper extremity deep venous system from the level of the subclavian vein and including the jugular, axillary, basilic, radial, ulnar and upper cephalic vein. Spectral Doppler was utilized to evaluate flow at rest and with distal augmentation maneuvers. COMPARISON:  None Available. FINDINGS: Contralateral Subclavian Vein: Respiratory phasicity is normal and symmetric with the symptomatic side. No evidence of thrombus. Normal compressibility. Internal Jugular Vein: No evidence of thrombus. Normal compressibility, respiratory phasicity and response to augmentation. Subclavian Vein: No evidence of thrombus. Normal compressibility, respiratory phasicity and response to augmentation. Axillary Vein: No evidence of thrombus. Normal compressibility, respiratory phasicity and response to augmentation. Cephalic Vein: No evidence of thrombus. Normal compressibility, respiratory phasicity and response to augmentation. Basilic Vein: No evidence of thrombus. Normal compressibility, respiratory phasicity and response to augmentation. Brachial Veins: No evidence of thrombus. Normal compressibility, respiratory phasicity and response to augmentation. Radial Veins: No evidence of thrombus. Normal compressibility, respiratory phasicity and response to augmentation. Ulnar Veins: No evidence of thrombus. Normal compressibility, respiratory phasicity and response to augmentation. Venous Reflux:  None visualized. Other Findings: Edema seen within the subcutaneous soft tissues of the left  forearm. IMPRESSION: No evidence of DVT within the left upper extremity.  Electronically Signed   By: Charlett Nose M.D.   On: 02/06/2023 00:23   MR FOOT LEFT WO CONTRAST  Result Date: 02/05/2023 CLINICAL DATA:  Diabetic ulcer on left great toe. Serosanguineous drainage. No recent fever. Soft tissue infection suspected. EXAM: MRI OF THE LEFT FOOT WITHOUT CONTRAST TECHNIQUE: Multiplanar, multisequence MR imaging of the left forefoot was performed. No intravenous contrast was administered. COMPARISON:  None Available. FINDINGS: Bones/Joint/Cartilage There is cortical destruction of the head of the distal 1st phalanx, best seen on the coronal images. Associated decreased T1 and increased T2 marrow signal. No significant interphalangeal joint effusion. The proximal phalanx appears intact. The additional toes appear intact. There are moderate degenerative changes at the 1st metatarsophalangeal joint. The visualized metatarsals appear intact. No significant arthropathy at the Lisfranc joint. Ligaments Intact Lisfranc ligament. The collateral ligaments of the metatarsophalangeal joints appear intact. Muscles and Tendons Generalized forefoot muscular atrophy with mild nonspecific muscular edema. The forefoot tendons appear intact, without significant tenosynovitis. Soft tissues Generalized soft tissue swelling in the great toe with apparent ulceration along the plantar aspect of the distal phalanx. There is a small amount of heterogeneous fluid surrounding the distal phalanx, but no well-defined abscess. No other focal soft tissue abnormalities are identified. IMPRESSION: 1. Findings are consistent with osteomyelitis of the distal 1st phalanx. 2. Generalized soft tissue swelling in the great toe with apparent ulceration along the plantar aspect of the distal phalanx. No well-defined abscess. 3. Moderate degenerative changes at the 1st metatarsophalangeal joint. 4. Generalized forefoot muscular atrophy. Electronically  Signed   By: Carey Bullocks M.D.   On: 02/05/2023 15:45    Anti-infectives: Anti-infectives (From admission, onward)    Start     Dose/Rate Route Frequency Ordered Stop   02/07/23 1800  vancomycin (VANCOREADY) IVPB 2000 mg/400 mL        2,000 mg 200 mL/hr over 120 Minutes Intravenous Every 24 hours 02/07/23 0707     02/07/23 1400  ceFEPIme (MAXIPIME) 2 g in sodium chloride 0.9 % 100 mL IVPB        2 g 200 mL/hr over 30 Minutes Intravenous Every 8 hours 02/07/23 0700     02/06/23 1800  vancomycin (VANCOREADY) IVPB 1250 mg/250 mL  Status:  Discontinued        1,250 mg 166.7 mL/hr over 90 Minutes Intravenous Every 24 hours 02/05/23 1547 02/06/23 0958   02/06/23 1800  ceFEPIme (MAXIPIME) 2 g in sodium chloride 0.9 % 100 mL IVPB  Status:  Discontinued        2 g 200 mL/hr over 30 Minutes Intravenous Every 12 hours 02/06/23 0805 02/07/23 0700   02/06/23 1800  vancomycin (VANCOREADY) IVPB 1750 mg/350 mL  Status:  Discontinued        1,750 mg 175 mL/hr over 120 Minutes Intravenous Every 24 hours 02/06/23 0958 02/07/23 0707   02/06/23 1230  metroNIDAZOLE (FLAGYL) IVPB 500 mg        500 mg 100 mL/hr over 60 Minutes Intravenous Every 12 hours 02/06/23 1140     02/05/23 2200  ceFEPIme (MAXIPIME) 2 g in sodium chloride 0.9 % 100 mL IVPB  Status:  Discontinued        2 g 200 mL/hr over 30 Minutes Intravenous Every 8 hours 02/05/23 1547 02/06/23 0805   02/05/23 2200  hydroxychloroquine (PLAQUENIL) tablet 200 mg        200 mg Oral 2 times daily 02/05/23 1707     02/05/23 1645  vancomycin (VANCOREADY) IVPB 2000 mg/400  mL        2,000 mg 200 mL/hr over 120 Minutes Intravenous  Once 02/05/23 1547 02/05/23 1809   02/05/23 1200  ceFEPIme (MAXIPIME) 2 g in sodium chloride 0.9 % 100 mL IVPB        2 g 200 mL/hr over 30 Minutes Intravenous  Once 02/05/23 1153 02/05/23 1317   02/05/23 1200  metroNIDAZOLE (FLAGYL) IVPB 500 mg        500 mg 100 mL/hr over 60 Minutes Intravenous  Once 02/05/23 1153  02/05/23 1459   02/05/23 1200  vancomycin (VANCOCIN) IVPB 1000 mg/200 mL premix  Status:  Discontinued        1,000 mg 200 mL/hr over 60 Minutes Intravenous  Once 02/05/23 1153 02/05/23 1547       Assessment/Plan: s/p Procedure(s): Left Hallux Amputation (Left) Assessment: Osteomyelitis left hallux.  Plan: Betadine gauze dressing reapplied to the hallux.  At this point awaiting vascular procedure on either Tuesday or Monday if we can get this moved up.  Plan is for amputation following his vascular procedure.  Had discontinued his Eliquis in anticipation of amputation today.  Most likely we will need to start heparin to cover until his procedures can be performed.  LOS: 2 days    Ricci Barker 02/07/2023

## 2023-02-07 NOTE — Evaluation (Signed)
Physical Therapy Evaluation Patient Details Name: Kristopher Oneal MRN: [id number] DOB: 1950/06/26 Today's Date: 02/07/2023  History of Present Illness  72 year old male with history of insulin-dependent diabetes mellitus, hypertension, hyperlipidemia, permanent atrial fibrillation on Eliquis, who presents to the emergency department for chief concerns of left foot infection concerning for osteomyelitis.  Clinical Impression  Pt pleasant and interactive, generally speaking he moved very well, but did struggle with NWBing simulation acts.  Pt is to have L great toe amputation in 2-3 days, unsure of what WBing/foot wear limitations will be required, but in the likely even that he will be either heel only PWBing or NWBing we trialed both with the walker.  He did well with feel weight distribution and showed good confidence, less so with NWBing attempts.  Pt lives in 5th wheel camper and reports need for multiple elevation/step changes and tight quarters where a walker may not fit well.  Will continue to see pt and further address home management, mobility, safety, etc once post-op precautions have been placed.      If plan is discharge home, recommend the following: A little help with walking and/or transfers;A little help with bathing/dressing/bathroom;Help with stairs or ramp for entrance;Assist for transportation   Can travel by private vehicle        Equipment Recommendations Rolling walker (2 wheels)  Recommendations for Other Services       Functional Status Assessment       Precautions / Restrictions Precautions Precautions: Fall Restrictions Weight Bearing Restrictions: No (will very likely have WBing precautions post sx/amputation)      Mobility  Bed Mobility Overal bed mobility: Independent                  Transfers Overall transfer level: Modified independent Equipment used: Rolling walker (2 wheels)               General transfer comment: Pt able to rise  from standard height bed w/o issue.  Used UEs appropriately w/o excessive reliance to rise and once up (on walker)    Ambulation/Gait Ambulation/Gait assistance: Supervision Gait Distance (Feet): 75 Feet Assistive device: Rolling walker (2 wheels), None         General Gait Details: Pt able to assume confident and safe cadence with walker, slower but still steady w/o AD.  We did discussed and practive both PWBing and NWBing in prep for likely post-op limitations.  Pt was able to maintain PWBing and heel only WBing well, however he struggled to really confidently/safely maintain NWBing progression with walker/hop strategy.  Stairs            Wheelchair Mobility     Tilt Bed    Modified Rankin (Stroke Patients Only)       Balance Overall balance assessment: Modified Independent                                           Pertinent Vitals/Pain Pain Assessment Pain Assessment: No/denies pain    Home Living Family/patient expects to be discharged to:: Private residence Living Arrangements: Spouse/significant other Available Help at Discharge: Family;Available PRN/intermittently (wife works ~5 minutes away) Type of Home:  (5th wheel camper) Home Access: Stairs to enter Entrance Stairs-Rails: Can reach both (has b/l rails to access deck, R grab bar entering camper) Entrance Stairs-Number of Steps: 4+4   Home Layout: Other (Comment) (2-3 steps to access bed  and bathroom area of tight quarters RV) Home Equipment: Agricultural consultant (2 wheels);BSC/3in1 (wife will try to find these from storage)      Prior Function Prior Level of Function : Independent/Modified Independent             Mobility Comments: Reports not needing AD, driving and independent ADLs Comments: no issues     Extremity/Trunk Assessment   Upper Extremity Assessment Upper Extremity Assessment: Overall WFL for tasks assessed    Lower Extremity Assessment Lower Extremity  Assessment: Overall WFL for tasks assessed       Communication   Communication Communication: No apparent difficulties  Cognition Arousal: Alert Behavior During Therapy: WFL for tasks assessed/performed Overall Cognitive Status: Within Functional Limits for tasks assessed                                          General Comments General comments (skin integrity, edema, etc.): Pt generally did very well and was able to walk ad lib with walker (and ~15 ft w/o AD) but struggled with L NWBing challenges    Exercises     Assessment/Plan    PT Assessment Patient needs continued PT services  PT Problem List Decreased activity tolerance;Decreased balance;Decreased coordination;Decreased knowledge of use of DME;Decreased safety awareness;Impaired sensation       PT Treatment Interventions DME instruction;Gait training;Stair training;Functional mobility training;Therapeutic activities;Therapeutic exercise;Balance training;Patient/family education    PT Goals (Current goals can be found in the Care Plan section)  Acute Rehab PT Goals Patient Stated Goal: go home PT Goal Formulation: With patient/family Time For Goal Achievement: 02/20/23 Potential to Achieve Goals: Good    Frequency Min 1X/week     Co-evaluation               AM-PAC PT "6 Clicks" Mobility  Outcome Measure Help needed turning from your back to your side while in a flat bed without using bedrails?: None Help needed moving from lying on your back to sitting on the side of a flat bed without using bedrails?: None Help needed moving to and from a bed to a chair (including a wheelchair)?: None Help needed standing up from a chair using your arms (e.g., wheelchair or bedside chair)?: None Help needed to walk in hospital room?: None Help needed climbing 3-5 steps with a railing? : A Little 6 Click Score: 23    End of Session Equipment Utilized During Treatment: Gait belt Activity Tolerance:  Patient tolerated treatment well Patient left: with chair alarm set;with call bell/phone within reach;with family/visitor present Nurse Communication: Mobility status PT Visit Diagnosis: Difficulty in walking, not elsewhere classified (R26.2);Other abnormalities of gait and mobility (R26.89)    Time: 1610-9604 PT Time Calculation (min) (ACUTE ONLY): 33 min   Charges:   PT Evaluation $PT Eval Low Complexity: 1 Low PT Treatments $Gait Training: 8-22 mins PT General Charges $$ ACUTE PT VISIT: 1 Visit         Malachi Pro, DPT 02/07/2023, 4:10 PM

## 2023-02-07 NOTE — Consult Note (Signed)
PHARMACY - ANTICOAGULATION CONSULT NOTE  Pharmacy Consult for Lovenox Indication: atrial fibrillation  Patient Measurements: Height: 6\' 2"  (188 cm) Weight: 117 kg (258 lb) IBW/kg (Calculated) : 82.2  Labs: Recent Labs    02/05/23 1104 02/05/23 1148 02/06/23 0523 02/07/23 0450  HGB 15.5  --  13.4 13.4  HCT 47.4  --  40.9 39.0  PLT 322  --  250 280  LABPROT  --  15.7*  --   --   INR  --  1.2  --   --   CREATININE 1.49*  --  1.54* 1.17    Estimated Creatinine Clearance: 75.3 mL/min (by C-G formula based on SCr of 1.17 mg/dL).   Medical History: Past Medical History:  Diagnosis Date   Anxiety    Arrhythmia    atrial fibrillation   Arthritis    CHF (congestive heart failure) (HCC)    Diabetes mellitus without complication (HCC)    type 2   Dysrhythmia    PVC/ a fib   Heart murmur    High cholesterol    Hypertension     Medications:  Apixaban 5 mg BID prior to admission  Assessment: 72 y/o M with history including Afib on apixaban admitted with acute OM of left distal first phalanx pending vascular procedure which will followed by amputation. Apixaban placed on hold. Pharmacy consulted to dose Lovenox for Afib.  Plan:  --Lovenox 115 mg (~ 1 mg/kg)  q12h --CBC and Scr per protocol --Typically hold Lovenox 24h prior to procedure. Follow-up procedural schedule  Tressie Ellis 02/07/2023,1:50 PM

## 2023-02-08 DIAGNOSIS — M86272 Subacute osteomyelitis, left ankle and foot: Secondary | ICD-10-CM | POA: Diagnosis not present

## 2023-02-08 LAB — BASIC METABOLIC PANEL
Anion gap: 11 (ref 5–15)
BUN: 29 mg/dL — ABNORMAL HIGH (ref 8–23)
CO2: 21 mmol/L — ABNORMAL LOW (ref 22–32)
Calcium: 9.2 mg/dL (ref 8.9–10.3)
Chloride: 102 mmol/L (ref 98–111)
Creatinine, Ser: 1.14 mg/dL (ref 0.61–1.24)
GFR, Estimated: >60 mL/min (ref >60)
Glucose, Bld: 156 mg/dL — ABNORMAL HIGH (ref 70–99)
Potassium: 3.9 mmol/L (ref 3.5–5.1)
Sodium: 134 mmol/L — ABNORMAL LOW (ref 135–145)

## 2023-02-08 LAB — GLUCOSE, CAPILLARY
Glucose-Capillary: 159 mg/dL — ABNORMAL HIGH (ref 70–99)
Glucose-Capillary: 156 mg/dL — ABNORMAL HIGH (ref 70–99)
Glucose-Capillary: 149 mg/dL — ABNORMAL HIGH (ref 70–99)
Glucose-Capillary: 208 mg/dL — ABNORMAL HIGH (ref 70–99)

## 2023-02-08 LAB — MAGNESIUM: Magnesium: 2.1 mg/dL (ref 1.7–2.4)

## 2023-02-08 LAB — CBC
HCT: 42.2 % (ref 39.0–52.0)
Hemoglobin: 14.3 g/dL (ref 13.0–17.0)
MCH: 31.1 pg (ref 26.0–34.0)
MCHC: 33.9 g/dL (ref 30.0–36.0)
MCV: 91.7 fL (ref 80.0–100.0)
Platelets: 352 10*3/uL (ref 150–400)
RBC: 4.6 MIL/uL (ref 4.22–5.81)
RDW: 13.2 % (ref 11.5–15.5)
WBC: 12.2 10*3/uL — ABNORMAL HIGH (ref 4.0–10.5)
nRBC: 0 % (ref 0.0–0.2)

## 2023-02-08 NOTE — Progress Notes (Signed)
PROGRESS NOTE  Kristopher Oneal  DOB: 01-04-1951  PCP: Danella Penton, MD RUE:[id number]  DOA: 02/05/2023  LOS: 3 days  Hospital Day: 4  Brief narrative: Kristopher Oneal is a 72 y.o. male with PMH significant for DM2, HTN, HLD, permanent A-fib on Eliquis, CHF, arthritis, anxiety 10/17, patient was sent to the ED from [facility] clinic podiatry with complaint of left foot infection concerning for osteomyelitis.  MRI left foot showed osteomyelitis of the distal first phalanx.  Generalized soft tissue swelling in the great toe with apparent ulceration along the plantar aspect of the distal phalanx.  No well-defined abscess.   Admitted to [facility] ABI showed abnormal resting left ankle-brachial index consistent with at least moderate underlying PAD Seen by podiatry and vascular surgery.  Subjective: Patient was seen and examined this morning. Lying down on down.  Not in distress.  Wife at bedside.  Assessment and plan: Acute osteomyelitis of left distal first phalanx  Not septic. MRI and ABI findings as above Podiatry and vascular surgery following Tentative plan of angiogram on Tuesday followed by left hallux amputation Currently on broad-spectrum IV antibiotic coverage with vanc/cefe/flagyl Recent Labs  Lab 02/05/23 1104 02/06/23 0523 02/07/23 0450 02/08/23 0433  WBC 13.0* 12.4* 10.0 12.2*  LATICACIDVEN 1.4  --   --   --    PAD  Left ABI abnormal angiogram tentatively planned for next Tuesday cont statin.  Eliquis on hold in anticipation of surgery.  Currently on Lovenox full dose.   Chronic atrial fibrillation Rate controlled on Cardizem and Toprol. Chronically anticoagulated on Eliquis 5 mg twice daily.  Anticoagulation plan as above.  Type 2 diabetes mellitus with hyperglycemia A1c 7.8 on 02/06/2023 PTA meds-70/30 Novolin 60 units daily and 30 units nightly, metformin 1000 mg twice daily, glipizide 5 mg twice daily, Currently continued on the same insulin regimen.  Continue to hold  metformin and glipizide.  Continue SSI/Accu-Cheks Recent Labs  Lab 02/07/23 1132 02/07/23 1652 02/07/23 2051 02/08/23 0815 02/08/23 1158  GLUCAP 152* 219* 197* 159* 156*    HTN PTA meds-Cardizem, Toprol, triamterene 37.5 mg daily, HCTZ 25 mg daily, Lasix 20 mg daily as needed Continue Cardizem, Toprol, triamterene-HCTZ. Blood pressure controlled IV hydralazine as needed  HLD Lovastatin, fenofibrate   Left arm swelling 10/17, RN noted patient IV infiltrated during Vanc infusion.  Right upper extremity US neg for DVT   Undifferentiated inflammatory arthritis Cont home hydroxychloroquine 200 mg p.o. twice daily Continue outpatient follow-up with rheumatologist  Anxiety Continue Effexor    Mobility: PT eval obtained.  Goals of care   Code Status: Full Code     DVT prophylaxis:  Place TED hose Start: 02/05/23 1415   Antimicrobials: Vancomycin, cefepime, Flagyl Fluid: None Consultants: Vascular surgery, podiatry Family Communication: None at bedside  Status: Inpatient Level of care:  Telemetry Medical   Patient is from: Home Needs to continue in-hospital care: Pending angiogram and surgery, currently on IV antibiotics Anticipated d/c to: Pending clinical course      Diet:  Diet Order             Diet heart healthy/carb modified Room service appropriate? Yes; Fluid consistency: Thin  Diet effective now                   Scheduled Meds:  cyanocobalamin  1,000 mcg Oral Daily   diltiazem  300 mg Oral QHS   enoxaparin (LOVENOX) injection  115 mg Subcutaneous Q12H   hydroxychloroquine  200 mg Oral BID  insulin aspart  0-5 Units Subcutaneous QHS   insulin aspart  0-9 Units Subcutaneous TID WC   insulin NPH Human  30 Units Subcutaneous QHS   insulin NPH Human  60 Units Subcutaneous QAC breakfast   metoprolol succinate  100 mg Oral QHS   metoprolol tartrate  2.5 mg Intravenous Once   multivitamin with minerals  1 tablet Oral Daily   pravastatin  40  mg Oral q1800   triamterene-hydrochlorothiazide  1 tablet Oral Daily   venlafaxine XR  37.5 mg Oral Q breakfast    PRN meds: acetaminophen **OR** acetaminophen, fluticasone, guaiFENesin, hydrALAZINE, melatonin, metoprolol tartrate, ondansetron **OR** ondansetron (ZOFRAN) IV, senna-docusate   Infusions:   ceFEPime (MAXIPIME) IV 2 g (02/08/23 1208)   metronidazole 500 mg (02/08/23 1243)   vancomycin Stopped (02/08/23 0535)    Antimicrobials: Anti-infectives (From admission, onward)    Start     Dose/Rate Route Frequency Ordered Stop   02/07/23 1800  vancomycin (VANCOREADY) IVPB 2000 mg/400 mL        2,000 mg 200 mL/hr over 120 Minutes Intravenous Every 24 hours 02/07/23 0707     02/07/23 1400  ceFEPIme (MAXIPIME) 2 g in sodium chloride 0.9 % 100 mL IVPB        2 g 200 mL/hr over 30 Minutes Intravenous Every 8 hours 02/07/23 0700     02/06/23 1800  vancomycin (VANCOREADY) IVPB 1250 mg/250 mL  Status:  Discontinued        1,250 mg 166.7 mL/hr over 90 Minutes Intravenous Every 24 hours 02/05/23 1547 02/06/23 0958   02/06/23 1800  ceFEPIme (MAXIPIME) 2 g in sodium chloride 0.9 % 100 mL IVPB  Status:  Discontinued        2 g 200 mL/hr over 30 Minutes Intravenous Every 12 hours 02/06/23 0805 02/07/23 0700   02/06/23 1800  vancomycin (VANCOREADY) IVPB 1750 mg/350 mL  Status:  Discontinued        1,750 mg 175 mL/hr over 120 Minutes Intravenous Every 24 hours 02/06/23 0958 02/07/23 0707   02/06/23 1230  metroNIDAZOLE (FLAGYL) IVPB 500 mg        500 mg 100 mL/hr over 60 Minutes Intravenous Every 12 hours 02/06/23 1140     02/05/23 2200  ceFEPIme (MAXIPIME) 2 g in sodium chloride 0.9 % 100 mL IVPB  Status:  Discontinued        2 g 200 mL/hr over 30 Minutes Intravenous Every 8 hours 02/05/23 1547 02/06/23 0805   02/05/23 2200  hydroxychloroquine (PLAQUENIL) tablet 200 mg        200 mg Oral 2 times daily 02/05/23 1707     02/05/23 1645  vancomycin (VANCOREADY) IVPB 2000 mg/400 mL         2,000 mg 200 mL/hr over 120 Minutes Intravenous  Once 02/05/23 1547 02/05/23 1809   02/05/23 1200  ceFEPIme (MAXIPIME) 2 g in sodium chloride 0.9 % 100 mL IVPB        2 g 200 mL/hr over 30 Minutes Intravenous  Once 02/05/23 1153 02/05/23 1317   02/05/23 1200  metroNIDAZOLE (FLAGYL) IVPB 500 mg        500 mg 100 mL/hr over 60 Minutes Intravenous  Once 02/05/23 1153 02/05/23 1459   02/05/23 1200  vancomycin (VANCOCIN) IVPB 1000 mg/200 mL premix  Status:  Discontinued        1,000 mg 200 mL/hr over 60 Minutes Intravenous  Once 02/05/23 1153 02/05/23 1547       Objective: Vitals:   02/07/23  0809 02/08/23 0749  BP: 113/60 115/85  Pulse: 88 78  Resp: 15 16  Temp: (!) 97.5 F (36.4 C) 98.5 F (36.9 C)  SpO2: 98% 99%    Intake/Output Summary (Last 24 hours) at 02/08/2023 1431 Last data filed at 02/08/2023 1201 Gross per 24 hour  Intake 1440.96 ml  Output 1300 ml  Net 140.96 ml   Filed Weights   02/05/23 1540  Weight: 117 kg   Weight change:  Body mass index is 33.13 kg/m.   Physical Exam: General exam: Pleasant, elderly.  Not in pain currently Skin: No rashes, lesions or ulcers. HEENT: Atraumatic, normocephalic, no obvious bleeding Lungs: Clear to auscultation bilaterally CVS: Regular rate and rhythm, no murmur GI/Abd soft, nontender, nondistended, bowel sound present CNS: Alert, awake, oriented x 3 Psychiatry: Mood appropriate Extremities: No pedal edema, no calf tenderness.  Left foot wound wrapped in bandage  Data Review: I have personally reviewed the laboratory data and studies available.  F/u labs ordered Unresulted Labs (From admission, onward)     Start     Ordered   02/07/23 0500  Basic metabolic panel  Daily,   R      02/06/23 1533   02/07/23 0500  CBC  Daily,   R      02/06/23 1533   02/07/23 0500  Magnesium  Daily,   R      02/06/23 1533           Total time spent in review of labs and imaging, patient evaluation, formulation of plan,  documentation and communication with family: 45 minutes  Signed, Lorin Glass, MD [facility] 02/08/2023

## 2023-02-08 NOTE — Plan of Care (Signed)

## 2023-02-09 DIAGNOSIS — M86272 Subacute osteomyelitis, left ankle and foot: Secondary | ICD-10-CM | POA: Diagnosis not present

## 2023-02-09 DIAGNOSIS — I70245 Atherosclerosis of native arteries of left leg with ulceration of other part of foot: Secondary | ICD-10-CM | POA: Diagnosis not present

## 2023-02-09 DIAGNOSIS — Z87891 Personal history of nicotine dependence: Secondary | ICD-10-CM

## 2023-02-09 DIAGNOSIS — Z79899 Other long term (current) drug therapy: Secondary | ICD-10-CM

## 2023-02-09 DIAGNOSIS — I10 Essential (primary) hypertension: Secondary | ICD-10-CM

## 2023-02-09 DIAGNOSIS — E1151 Type 2 diabetes mellitus with diabetic peripheral angiopathy without gangrene: Secondary | ICD-10-CM

## 2023-02-09 DIAGNOSIS — L97529 Non-pressure chronic ulcer of other part of left foot with unspecified severity: Secondary | ICD-10-CM | POA: Diagnosis not present

## 2023-02-09 DIAGNOSIS — I70201 Unspecified atherosclerosis of native arteries of extremities, right leg: Secondary | ICD-10-CM

## 2023-02-09 DIAGNOSIS — E785 Hyperlipidemia, unspecified: Secondary | ICD-10-CM

## 2023-02-09 DIAGNOSIS — Z794 Long term (current) use of insulin: Secondary | ICD-10-CM

## 2023-02-09 DIAGNOSIS — Z7984 Long term (current) use of oral hypoglycemic drugs: Secondary | ICD-10-CM

## 2023-02-09 LAB — CBC
HCT: 40.9 % (ref 39.0–52.0)
Hemoglobin: 13.8 g/dL (ref 13.0–17.0)
MCH: 30.5 pg (ref 26.0–34.0)
MCHC: 33.7 g/dL (ref 30.0–36.0)
MCV: 90.3 fL (ref 80.0–100.0)
Platelets: 344 K/uL (ref 150–400)
RBC: 4.53 MIL/uL (ref 4.22–5.81)
RDW: 13 % (ref 11.5–15.5)
WBC: 11.1 K/uL — ABNORMAL HIGH (ref 4.0–10.5)
nRBC: 0 % (ref 0.0–0.2)

## 2023-02-09 LAB — GLUCOSE, CAPILLARY
Glucose-Capillary: 116 mg/dL — ABNORMAL HIGH (ref 70–99)
Glucose-Capillary: 142 mg/dL — ABNORMAL HIGH (ref 70–99)
Glucose-Capillary: 150 mg/dL — ABNORMAL HIGH (ref 70–99)
Glucose-Capillary: 135 mg/dL — ABNORMAL HIGH (ref 70–99)
Glucose-Capillary: 207 mg/dL — ABNORMAL HIGH (ref 70–99)

## 2023-02-09 LAB — BASIC METABOLIC PANEL WITH GFR
Anion gap: 9 (ref 5–15)
BUN: 28 mg/dL — ABNORMAL HIGH (ref 8–23)
CO2: 22 mmol/L (ref 22–32)
Calcium: 9.2 mg/dL (ref 8.9–10.3)
Chloride: 102 mmol/L (ref 98–111)
Creatinine, Ser: 1.08 mg/dL (ref 0.61–1.24)
GFR, Estimated: >60 mL/min (ref >60)
Glucose, Bld: 138 mg/dL — ABNORMAL HIGH (ref 70–99)
Potassium: 4 mmol/L (ref 3.5–5.1)
Sodium: 133 mmol/L — ABNORMAL LOW (ref 135–145)

## 2023-02-09 LAB — MAGNESIUM: Magnesium: 1.9 mg/dL (ref 1.7–2.4)

## 2023-02-09 MED ORDER — DIPHENHYDRAMINE HCL 50 MG/ML IJ SOLN: 50.0000 mg | Freq: Once | INTRAMUSCULAR | Status: DC | PRN

## 2023-02-09 MED ORDER — INSULIN NPH (HUMAN) (ISOPHANE) 100 UNIT/ML ~~LOC~~ SUSP
50.0000 [IU] | Freq: Every day | SUBCUTANEOUS | Status: DC
  Administered 2023-02-09 – 2023-02-11 (×2): 50 [IU] via SUBCUTANEOUS
  Filled 2023-02-09: qty 10

## 2023-02-09 MED ORDER — VANCOMYCIN HCL IN DEXTROSE 1-5 GM/200ML-% IV SOLN
1000.0000 mg | INTRAVENOUS | Status: DC
  Administered 2023-02-09 – 2023-02-10 (×2): 1000 mg via INTRAVENOUS
  Filled 2023-02-09 (×3): qty 200

## 2023-02-09 MED ORDER — METHYLPREDNISOLONE SODIUM SUCC 125 MG IJ SOLR: 125.0000 mg | Freq: Once | INTRAMUSCULAR | Status: DC | PRN

## 2023-02-09 MED ORDER — MIDAZOLAM HCL 2 MG/ML PO SYRP: 8.0000 mg | ORAL_SOLUTION | Freq: Once | ORAL | Status: DC | PRN

## 2023-02-09 MED ORDER — CEFAZOLIN SODIUM-DEXTROSE 2-4 GM/100ML-% IV SOLN: 2.0000 g | INTRAVENOUS | Status: DC

## 2023-02-09 MED ORDER — INSULIN NPH (HUMAN) (ISOPHANE) 100 UNIT/ML ~~LOC~~ SUSP: 50.0000 [IU] | Freq: Every day | SUBCUTANEOUS | Status: DC

## 2023-02-09 MED ORDER — SODIUM CHLORIDE 0.9 % IV SOLN: INTRAVENOUS | Status: DC

## 2023-02-09 MED ORDER — FAMOTIDINE 20 MG PO TABS: 40.0000 mg | ORAL_TABLET | Freq: Once | ORAL | Status: DC | PRN

## 2023-02-09 MED ORDER — FENTANYL CITRATE PF 50 MCG/ML IJ SOSY: 12.5000 ug | PREFILLED_SYRINGE | Freq: Once | INTRAMUSCULAR | Status: DC | PRN

## 2023-02-09 NOTE — Progress Notes (Signed)
Subjective/Chief Complaint: Patient seen.  No complaints.   Objective: Vital signs in last 24 hours: Temp:  [97.9 F (36.6 C)-98.3 F (36.8 C)] 98.3 F (36.8 C) (10/21 0752) Pulse Rate:  [72-97] 72 (10/21 0752) Resp:  [16-18] 18 (10/21 0752) BP: (116-126)/(60-78) 116/78 (10/21 0752) SpO2:  [96 %-97 %] 96 % (10/21 0752) Last BM Date : 02/08/23  Intake/Output from previous day: 10/20 0701 - 10/21 0700 In: 568.2 [P.O.:240; IV Piggyback:328.2] Out: 900 [Urine:900] Intake/Output this shift: Total I/O In: 360 [P.O.:360] Out: -   Moderately heavy purulence is noted from the wound and on the bandaging on the left hallux.  Some expressible purulence from along the lateral aspect of the toe.  Still significant erythema and edema in the hallux and distal forefoot.  Lab Results:  Recent Labs    02/08/23 0433 02/09/23 0403  WBC 12.2* 11.1*  HGB 14.3 13.8  HCT 42.2 40.9  PLT 352 344   BMET Recent Labs    02/08/23 0433 02/09/23 0403  NA 134* 133*  K 3.9 4.0  CL 102 102  CO2 21* 22  GLUCOSE 156* 138*  BUN 29* 28*  CREATININE 1.14 1.08  CALCIUM 9.2 9.2   PT/INR No results for input(s): "LABPROT", "INR" in the last 72 hours. ABG No results for input(s): "PHART", "HCO3" in the last 72 hours.  Invalid input(s): "PCO2", "PO2"  Studies/Results: No results found.  Anti-infectives: Anti-infectives (From admission, onward)    Start     Dose/Rate Route Frequency Ordered Stop   02/07/23 1800  vancomycin (VANCOREADY) IVPB 2000 mg/400 mL        2,000 mg 200 mL/hr over 120 Minutes Intravenous Every 24 hours 02/07/23 0707     02/07/23 1400  ceFEPIme (MAXIPIME) 2 g in sodium chloride 0.9 % 100 mL IVPB        2 g 200 mL/hr over 30 Minutes Intravenous Every 8 hours 02/07/23 0700     02/06/23 1800  vancomycin (VANCOREADY) IVPB 1250 mg/250 mL  Status:  Discontinued        1,250 mg 166.7 mL/hr over 90 Minutes Intravenous Every 24 hours 02/05/23 1547 02/06/23 0958   02/06/23  1800  ceFEPIme (MAXIPIME) 2 g in sodium chloride 0.9 % 100 mL IVPB  Status:  Discontinued        2 g 200 mL/hr over 30 Minutes Intravenous Every 12 hours 02/06/23 0805 02/07/23 0700   02/06/23 1800  vancomycin (VANCOREADY) IVPB 1750 mg/350 mL  Status:  Discontinued        1,750 mg 175 mL/hr over 120 Minutes Intravenous Every 24 hours 02/06/23 0958 02/07/23 0707   02/06/23 1230  metroNIDAZOLE (FLAGYL) IVPB 500 mg        500 mg 100 mL/hr over 60 Minutes Intravenous Every 12 hours 02/06/23 1140     02/05/23 2200  ceFEPIme (MAXIPIME) 2 g in sodium chloride 0.9 % 100 mL IVPB  Status:  Discontinued        2 g 200 mL/hr over 30 Minutes Intravenous Every 8 hours 02/05/23 1547 02/06/23 0805   02/05/23 2200  hydroxychloroquine (PLAQUENIL) tablet 200 mg        200 mg Oral 2 times daily 02/05/23 1707     02/05/23 1645  vancomycin (VANCOREADY) IVPB 2000 mg/400 mL        2,000 mg 200 mL/hr over 120 Minutes Intravenous  Once 02/05/23 1547 02/05/23 1809   02/05/23 1200  ceFEPIme (MAXIPIME) 2 g in sodium chloride 0.9 %  100 mL IVPB        2 g 200 mL/hr over 30 Minutes Intravenous  Once 02/05/23 1153 02/05/23 1317   02/05/23 1200  metroNIDAZOLE (FLAGYL) IVPB 500 mg        500 mg 100 mL/hr over 60 Minutes Intravenous  Once 02/05/23 1153 02/05/23 1459   02/05/23 1200  vancomycin (VANCOCIN) IVPB 1000 mg/200 mL premix  Status:  Discontinued        1,000 mg 200 mL/hr over 60 Minutes Intravenous  Once 02/05/23 1153 02/05/23 1547       Assessment/Plan: s/p Procedure(s): Left Hallux Amputation (Left) Assessment: Osteomyelitis left hallux  Plan: Betadine gauze packed within the ulcer on the left hallux followed by a sterile bandage.  Patient scheduled for angiogram tomorrow with possible intervention.  If this is able to be performed in the morning we will plan for amputation around noon since he will already be n.p.o.  If not we will have to plan for sometime on Wednesday for his amputation.  LOS: 4 days     Kristopher Oneal 02/09/2023

## 2023-02-09 NOTE — H&P (View-Only) (Signed)
Subjective/Chief Complaint: Patient seen.  No complaints.   Objective: Vital signs in last 24 hours: Temp:  [97.9 F (36.6 C)-98.3 F (36.8 C)] 98.3 F (36.8 C) (10/21 0752) Pulse Rate:  [72-97] 72 (10/21 0752) Resp:  [16-18] 18 (10/21 0752) BP: (116-126)/(60-78) 116/78 (10/21 0752) SpO2:  [96 %-97 %] 96 % (10/21 0752) Last BM Date : 02/08/23  Intake/Output from previous day: 10/20 0701 - 10/21 0700 In: 568.2 [P.O.:240; IV Piggyback:328.2] Out: 900 [Urine:900] Intake/Output this shift: Total I/O In: 360 [P.O.:360] Out: -   Moderately heavy purulence is noted from the wound and on the bandaging on the left hallux.  Some expressible purulence from along the lateral aspect of the toe.  Still significant erythema and edema in the hallux and distal forefoot.  Lab Results:  Recent Labs    02/08/23 0433 02/09/23 0403  WBC 12.2* 11.1*  HGB 14.3 13.8  HCT 42.2 40.9  PLT 352 344   BMET Recent Labs    02/08/23 0433 02/09/23 0403  NA 134* 133*  K 3.9 4.0  CL 102 102  CO2 21* 22  GLUCOSE 156* 138*  BUN 29* 28*  CREATININE 1.14 1.08  CALCIUM 9.2 9.2   PT/INR No results for input(s): "LABPROT", "INR" in the last 72 hours. ABG No results for input(s): "PHART", "HCO3" in the last 72 hours.  Invalid input(s): "PCO2", "PO2"  Studies/Results: No results found.  Anti-infectives: Anti-infectives (From admission, onward)    Start     Dose/Rate Route Frequency Ordered Stop   02/07/23 1800  vancomycin (VANCOREADY) IVPB 2000 mg/400 mL        2,000 mg 200 mL/hr over 120 Minutes Intravenous Every 24 hours 02/07/23 0707     02/07/23 1400  ceFEPIme (MAXIPIME) 2 g in sodium chloride 0.9 % 100 mL IVPB        2 g 200 mL/hr over 30 Minutes Intravenous Every 8 hours 02/07/23 0700     02/06/23 1800  vancomycin (VANCOREADY) IVPB 1250 mg/250 mL  Status:  Discontinued        1,250 mg 166.7 mL/hr over 90 Minutes Intravenous Every 24 hours 02/05/23 1547 02/06/23 0958   02/06/23  1800  ceFEPIme (MAXIPIME) 2 g in sodium chloride 0.9 % 100 mL IVPB  Status:  Discontinued        2 g 200 mL/hr over 30 Minutes Intravenous Every 12 hours 02/06/23 0805 02/07/23 0700   02/06/23 1800  vancomycin (VANCOREADY) IVPB 1750 mg/350 mL  Status:  Discontinued        1,750 mg 175 mL/hr over 120 Minutes Intravenous Every 24 hours 02/06/23 0958 02/07/23 0707   02/06/23 1230  metroNIDAZOLE (FLAGYL) IVPB 500 mg        500 mg 100 mL/hr over 60 Minutes Intravenous Every 12 hours 02/06/23 1140     02/05/23 2200  ceFEPIme (MAXIPIME) 2 g in sodium chloride 0.9 % 100 mL IVPB  Status:  Discontinued        2 g 200 mL/hr over 30 Minutes Intravenous Every 8 hours 02/05/23 1547 02/06/23 0805   02/05/23 2200  hydroxychloroquine (PLAQUENIL) tablet 200 mg        200 mg Oral 2 times daily 02/05/23 1707     02/05/23 1645  vancomycin (VANCOREADY) IVPB 2000 mg/400 mL        2,000 mg 200 mL/hr over 120 Minutes Intravenous  Once 02/05/23 1547 02/05/23 1809   02/05/23 1200  ceFEPIme (MAXIPIME) 2 g in sodium chloride 0.9 %  100 mL IVPB        2 g 200 mL/hr over 30 Minutes Intravenous  Once 02/05/23 1153 02/05/23 1317   02/05/23 1200  metroNIDAZOLE (FLAGYL) IVPB 500 mg        500 mg 100 mL/hr over 60 Minutes Intravenous  Once 02/05/23 1153 02/05/23 1459   02/05/23 1200  vancomycin (VANCOCIN) IVPB 1000 mg/200 mL premix  Status:  Discontinued        1,000 mg 200 mL/hr over 60 Minutes Intravenous  Once 02/05/23 1153 02/05/23 1547       Assessment/Plan: s/p Procedure(s): Left Hallux Amputation (Left) Assessment: Osteomyelitis left hallux  Plan: Betadine gauze packed within the ulcer on the left hallux followed by a sterile bandage.  Patient scheduled for angiogram tomorrow with possible intervention.  If this is able to be performed in the morning we will plan for amputation around noon since he will already be n.p.o.  If not we will have to plan for sometime on Wednesday for his amputation.  LOS: 4 days     Kristopher Oneal 02/09/2023

## 2023-02-09 NOTE — Plan of Care (Signed)

## 2023-02-09 NOTE — Plan of Care (Signed)

## 2023-02-09 NOTE — Progress Notes (Signed)
PROGRESS NOTE  Kristopher Oneal  DOB: [birth year]  PCP: Danella Penton, MD XBM:[id number]  DOA: 02/05/2023  LOS: 4 days  Hospital Day: 5  Brief narrative: Kristopher Oneal is a 72 y.o. male with PMH significant for DM2, HTN, HLD, permanent A-fib on Eliquis, CHF, arthritis, anxiety 10/17, patient was sent to the ED from [facility] clinic podiatry with complaint of left foot infection concerning for osteomyelitis.  MRI left foot showed osteomyelitis of the distal first phalanx.  Generalized soft tissue swelling in the great toe with apparent ulceration along the plantar aspect of the distal phalanx.  No well-defined abscess.   Admitted to [facility] ABI showed abnormal resting left ankle-brachial index consistent with at least moderate underlying PAD Seen by podiatry and vascular surgery.  Subjective: Patient was seen and examined this morning.  Lying on bed.  Not in distress.  No new symptoms.  Family not at bedside today. Hemodynamically stable Labs from this morning with sodium level slightly down to 133.  Blood sugar level mostly controlled.  Assessment and plan: Acute osteomyelitis of left distal first phalanx  Not septic. MRI and ABI findings as above Podiatry and vascular surgery following Tentative plan of angiogram on Tuesday followed by left hallux amputation Currently on broad-spectrum IV antibiotic coverage with vanc/cefe/flagyl Recent Labs  Lab 02/05/23 1104 02/06/23 0523 02/07/23 0450 02/08/23 0433 02/09/23 0403  WBC 13.0* 12.4* 10.0 12.2* 11.1*  LATICACIDVEN 1.4  --   --   --   --    PAD  Left ABI abnormal angiogram tentatively planned for next Tuesday cont statin.  Eliquis on hold in anticipation of surgery.  Currently on Lovenox full dose.   Chronic atrial fibrillation Rate controlled on Cardizem and Toprol. Chronically anticoagulated on Eliquis 5 mg twice daily.  Anticoagulation plan as above.  Type 2 diabetes mellitus with hyperglycemia A1c 7.8 on 02/06/2023 PTA  meds-70/30 Novolin 50 units daily and 30 units nightly, metformin 1000 mg twice daily, glipizide 5 mg twice daily, Currently continued on the same insulin regimen.  Continue to hold metformin and glipizide.  Continue SSI/Accu-Cheks Recent Labs  Lab 02/07/23 2051 02/08/23 0815 02/08/23 1158 02/08/23 1643 02/08/23 2157  GLUCAP 197* 159* 156* 149* 208*    HTN PTA meds-Cardizem, Toprol, triamterene 37.5 mg daily, HCTZ 25 mg daily, Lasix 20 mg daily as needed Continue Cardizem, Toprol, triamterene-HCTZ. Blood pressure controlled IV hydralazine as needed  HLD Continue lovastatin, fenofibrate   Left arm swelling 10/17, RN noted patient IV infiltrated during Vanc infusion.  Right upper extremity US neg for DVT   Undifferentiated inflammatory arthritis Cont home hydroxychloroquine 200 mg p.o. twice daily Continue outpatient follow-up with rheumatologist  Anxiety Continue Effexor    Mobility: PT eval obtained.  Goals of care   Code Status: Full Code     DVT prophylaxis:  Place TED hose Start: 02/05/23 1415   Antimicrobials: Vancomycin, cefepime, Flagyl Fluid: None Consultants: Vascular surgery, podiatry Family Communication: None at bedside  Status: Inpatient Level of care:  Telemetry Medical   Patient is from: Home Needs to continue in-hospital care: Pending angiogram and surgery, currently on IV antibiotics Anticipated d/c to: Pending clinical course      Diet:  Diet Order             Diet heart healthy/carb modified Room service appropriate? Yes; Fluid consistency: Thin  Diet effective now                   Scheduled Meds:  cyanocobalamin  1,000 mcg Oral Daily   diltiazem  300 mg Oral QHS   enoxaparin (LOVENOX) injection  115 mg Subcutaneous Q12H   hydroxychloroquine  200 mg Oral BID   insulin aspart  0-5 Units Subcutaneous QHS   insulin aspart  0-9 Units Subcutaneous TID WC   insulin NPH Human  30 Units Subcutaneous QHS   [START ON 02/10/2023]  insulin NPH Human  50 Units Subcutaneous QAC breakfast   metoprolol succinate  100 mg Oral QHS   metoprolol tartrate  2.5 mg Intravenous Once   multivitamin with minerals  1 tablet Oral Daily   pravastatin  40 mg Oral q1800   triamterene-hydrochlorothiazide  1 tablet Oral Daily   venlafaxine XR  37.5 mg Oral Q breakfast    PRN meds: acetaminophen **OR** acetaminophen, fluticasone, hydrALAZINE, melatonin, metoprolol tartrate, ondansetron **OR** ondansetron (ZOFRAN) IV, senna-docusate   Infusions:   ceFEPime (MAXIPIME) IV 2 g (02/09/23 0500)   metronidazole 500 mg (02/09/23 0007)   vancomycin 2,000 mg (02/08/23 1710)    Antimicrobials: Anti-infectives (From admission, onward)    Start     Dose/Rate Route Frequency Ordered Stop   02/07/23 1800  vancomycin (VANCOREADY) IVPB 2000 mg/400 mL        2,000 mg 200 mL/hr over 120 Minutes Intravenous Every 24 hours 02/07/23 0707     02/07/23 1400  ceFEPIme (MAXIPIME) 2 g in sodium chloride 0.9 % 100 mL IVPB        2 g 200 mL/hr over 30 Minutes Intravenous Every 8 hours 02/07/23 0700     02/06/23 1800  vancomycin (VANCOREADY) IVPB 1250 mg/250 mL  Status:  Discontinued        1,250 mg 166.7 mL/hr over 90 Minutes Intravenous Every 24 hours 02/05/23 1547 02/06/23 0958   02/06/23 1800  ceFEPIme (MAXIPIME) 2 g in sodium chloride 0.9 % 100 mL IVPB  Status:  Discontinued        2 g 200 mL/hr over 30 Minutes Intravenous Every 12 hours 02/06/23 0805 02/07/23 0700   02/06/23 1800  vancomycin (VANCOREADY) IVPB 1750 mg/350 mL  Status:  Discontinued        1,750 mg 175 mL/hr over 120 Minutes Intravenous Every 24 hours 02/06/23 0958 02/07/23 0707   02/06/23 1230  metroNIDAZOLE (FLAGYL) IVPB 500 mg        500 mg 100 mL/hr over 60 Minutes Intravenous Every 12 hours 02/06/23 1140     02/05/23 2200  ceFEPIme (MAXIPIME) 2 g in sodium chloride 0.9 % 100 mL IVPB  Status:  Discontinued        2 g 200 mL/hr over 30 Minutes Intravenous Every 8 hours 02/05/23  1547 02/06/23 0805   02/05/23 2200  hydroxychloroquine (PLAQUENIL) tablet 200 mg        200 mg Oral 2 times daily 02/05/23 1707     02/05/23 1645  vancomycin (VANCOREADY) IVPB 2000 mg/400 mL        2,000 mg 200 mL/hr over 120 Minutes Intravenous  Once 02/05/23 1547 02/05/23 1809   02/05/23 1200  ceFEPIme (MAXIPIME) 2 g in sodium chloride 0.9 % 100 mL IVPB        2 g 200 mL/hr over 30 Minutes Intravenous  Once 02/05/23 1153 02/05/23 1317   02/05/23 1200  metroNIDAZOLE (FLAGYL) IVPB 500 mg        500 mg 100 mL/hr over 60 Minutes Intravenous  Once 02/05/23 1153 02/05/23 1459   02/05/23 1200  vancomycin (VANCOCIN) IVPB 1000 mg/200 mL premix  Status:  Discontinued        1,000 mg 200 mL/hr over 60 Minutes Intravenous  Once 02/05/23 1153 02/05/23 1547       Objective: Vitals:   02/08/23 2155 02/09/23 0752  BP: 126/68 116/78  Pulse: 97 72  Resp: 18 18  Temp: 98.1 F (36.7 C) 98.3 F (36.8 C)  SpO2: 97% 96%    Intake/Output Summary (Last 24 hours) at 02/09/2023 0839 Last data filed at 02/09/2023 0010 Gross per 24 hour  Intake 568.17 ml  Output 900 ml  Net -331.83 ml   Filed Weights   02/05/23 1540  Weight: 117 kg   Weight change:  Body mass index is 33.13 kg/m.   Physical Exam: General exam: Pleasant, elderly.  Not in pain currently Skin: No rashes, lesions or ulcers. HEENT: Atraumatic, normocephalic, no obvious bleeding Lungs: Clear to auscultation bilaterally CVS: Regular rate and rhythm, no murmur GI/Abd soft, nontender, nondistended, bowel sound present CNS: Alert, awake, oriented x 3 Psychiatry: Mood appropriate Extremities: No pedal edema, no calf tenderness.  Left foot wound wrapped in bandage  Data Review: I have personally reviewed the laboratory data and studies available.  F/u labs ordered Unresulted Labs (From admission, onward)     Start     Ordered   02/09/23 0745  Glucose, capillary  Once,   R        02/09/23 0745   02/07/23 0500  Basic  metabolic panel  Daily,   R      02/06/23 1533   02/07/23 0500  CBC  Daily,   R      02/06/23 1533   02/07/23 0500  Magnesium  Daily,   R      02/06/23 1533           Total time spent in review of labs and imaging, patient evaluation, formulation of plan, documentation and communication with family: 25 minutes  Signed, Lorin Glass, MD [facility] 02/09/2023

## 2023-02-09 NOTE — Progress Notes (Signed)
MRN : [id number]  Kristopher Oneal is a 72 y.o. (1950/08/15) male who presents with chief complaint of check circulation.  History of Present Illness:   Patient resting comfortably in bed.  He denies severe pain in the left foot.  Current Meds  Medication Sig   diltiazem (CARDIZEM CD) 300 MG 24 hr capsule Take 1 capsule (300 mg total) by mouth at bedtime.   ELIQUIS 5 MG TABS tablet Take 1 tablet (5 mg total) by mouth 2 (two) times daily.   fenofibrate 160 MG tablet Take 160 mg by mouth daily.    fluticasone (FLONASE) 50 MCG/ACT nasal spray Place 1 spray into the nose daily as needed for allergies.   furosemide (LASIX) 20 MG tablet Take 1 tablet (20 mg total) by mouth daily. (Patient taking differently: Take 20 mg by mouth as needed for edema.)   glipiZIDE (GLUCOTROL) 5 MG tablet Take 5 mg by mouth 2 (two) times daily.   hydroxychloroquine (PLAQUENIL) 200 MG tablet Take 200 mg by mouth 2 (two) times daily.   insulin NPH-regular Human (HUMULIN 70/30) (70-30) 100 UNIT/ML injection Inject 25-50 Units into the skin See admin instructions. Inject 50 units into the skin in the morning and 30 units in the evening   lovastatin (MEVACOR) 40 MG tablet Take 1 tablet (40 mg total) by mouth daily with supper.   metFORMIN (GLUCOPHAGE) 1000 MG tablet Take 1,000 mg by mouth 2 (two) times daily with a meal.    metoprolol succinate (TOPROL-XL) 100 MG 24 hr tablet Take 1 tablet (100 mg total) by mouth at bedtime.   Multiple Vitamins-Minerals (ADVANCED DIABETIC MULTIVITAMIN) TABS Take 1 tablet by mouth daily.   mupirocin ointment (BACTROBAN) 2 % Apply 1 Application topically daily.   sildenafil (VIAGRA) 100 MG tablet Take 100 mg by mouth daily as needed.   triamterene-hydrochlorothiazide (DYAZIDE) 37.5-25 MG capsule Take 1 capsule by mouth daily.   venlafaxine XR (EFFEXOR-XR) 75 MG 24 hr capsule Take 75 mg by mouth daily with breakfast.    vitamin B-12 (CYANOCOBALAMIN) 500 MCG tablet Take 1,000 mcg by mouth daily.   [DISCONTINUED] triamterene-hydrochlorothiazide (MAXZIDE-25) 37.5-25 MG tablet Take 1 tablet by mouth daily.   [DISCONTINUED] venlafaxine XR (EFFEXOR-XR) 37.5 MG 24 hr capsule Take 37.5 mg by mouth daily with breakfast.    Past Medical History:  Diagnosis Date   Anxiety    Arrhythmia    atrial fibrillation   Arthritis    CHF (congestive heart failure) (HCC)    Diabetes mellitus without complication (HCC)    type 2   Dysrhythmia    PVC/ a fib   Heart murmur    High cholesterol    Hypertension     Past Surgical History:  Procedure Laterality Date   BACK SURGERY     l5/s1   COLONOSCOPY     KNEE ARTHROPLASTY Right 01/02/2020   Procedure: COMPUTER ASSISTED TOTAL KNEE ARTHROPLASTY;  Surgeon: Donato Heinz, MD;  Location: [facility] ORS;  Service: Orthopedics;  Laterality: Right;   KNEE ARTHROPLASTY Left 11/19/2020   Procedure: COMPUTER ASSISTED TOTAL KNEE ARTHROPLASTY;  Surgeon: Francesco Sor  P, MD;  Location: [facility] ORS;  Service: Orthopedics;  Laterality: Left;   ORIF PATELLA Left 04/16/2021   Procedure: OPEN REDUCTION INTERNAL (ORIF) FIXATION PATELLA;  Surgeon: Donato Heinz, MD;  Location: [facility] ORS;  Service: Orthopedics;  Laterality: Left;   ORIF PATELLA Left 05/10/2021   Procedure: OPEN REDUCTION INTERNAL (ORIF) FIXATION PATELLA;  Surgeon: Donato Heinz, MD;  Location: [facility] ORS;  Service: Orthopedics;  Laterality: Left;    Social History Social History   Tobacco Use   Smoking status: Former    Current packs/day: 0.00    Types: Cigarettes    Quit date: 1980    Years since quitting: 44.8   Smokeless tobacco: Never   Tobacco comments:    1980  Vaping Use   Vaping status: Never Used  Substance Use Topics   Alcohol use: Yes    Comment: rarely   Drug use: Never    Family History Family History  Problem Relation Age of Onset   Dementia Mother    Heart attack Father    Emphysema Father     Heart attack Brother     Allergies  Allergen Reactions   Gabapentin Other (See Comments)    Confusion     REVIEW OF SYSTEMS (Negative unless checked)  Constitutional: [] Weight loss  [] Fever  [] Chills Cardiac: [] Chest pain   [] Chest pressure   [] Palpitations   [] Shortness of breath when laying flat   [] Shortness of breath with exertion. Vascular:  [x] Pain in legs with walking   [] Pain in legs at rest  [] History of DVT   [] Phlebitis   [] Swelling in legs   [] Varicose veins   [] Non-healing ulcers Pulmonary:   [] Uses home oxygen   [] Productive cough   [] Hemoptysis   [] Wheeze  [] COPD   [] Asthma Neurologic:  [] Dizziness   [] Seizures   [] History of stroke   [] History of TIA  [] Aphasia   [] Vissual changes   [] Weakness or numbness in arm   [] Weakness or numbness in leg Musculoskeletal:   [] Joint swelling   [] Joint pain   [] Low back pain Hematologic:  [] Easy bruising  [] Easy bleeding   [] Hypercoagulable state   [] Anemic Gastrointestinal:  [] Diarrhea   [] Vomiting  [] Gastroesophageal reflux/heartburn   [] Difficulty swallowing. Genitourinary:  [] Chronic kidney disease   [] Difficult urination  [] Frequent urination   [] Blood in urine Skin:  [] Rashes   [] Ulcers  Psychological:  [] History of anxiety   []  History of major depression.  Physical Examination  Vitals:   02/08/23 1509 02/08/23 2155 02/09/23 0752 02/09/23 1310  BP: 117/60 126/68 116/78 126/64  Pulse: 86 97 72 82  Resp: 16 18 18 18   Temp: 97.9 F (36.6 C) 98.1 F (36.7 C) 98.3 F (36.8 C) 98.5 F (36.9 C)  TempSrc:   Oral Oral  SpO2: 96% 97% 96% 96%  Weight:      Height:       Body mass index is 33.13 kg/m. Gen: WD/WN, NAD Head: [place]/AT, No temporalis wasting.  Ear/Nose/Throat: Hearing grossly intact, nares w/o erythema or drainage Eyes: PER, EOMI, sclera nonicteric.  Neck: Supple, no masses.  No bruit or JVD.  Pulmonary:  Good air movement, no audible wheezing, no use of accessory muscles.  Cardiac: RRR, normal S1, S2, no  Murmurs. Vascular:  mild trophic changes,  open wounds left great toe Vessel Right Left  Radial Palpable Palpable  PT Not Palpable Not Palpable  DP Not Palpable Not Palpable  Gastrointestinal: soft, non-distended. No guarding/no peritoneal signs.  Musculoskeletal: M/S 5/5  throughout.  No visible deformity.  Neurologic: CN 2-12 intact. Pain and light touch intact in extremities.  Symmetrical.  Speech is fluent. Motor exam as listed above. Psychiatric: Judgment intact, Mood & affect appropriate for pt's clinical situation. Dermatologic: No rashes or ulcers noted.  No changes consistent with cellulitis.   CBC Lab Results  Component Value Date   WBC 11.1 (H) 02/09/2023   HGB 13.8 02/09/2023   HCT 40.9 02/09/2023   MCV 90.3 02/09/2023   PLT 344 02/09/2023    BMET    Component Value Date/Time   NA 133 (L) 02/09/2023 0403   K 4.0 02/09/2023 0403   CL 102 02/09/2023 0403   CO2 22 02/09/2023 0403   GLUCOSE 138 (H) 02/09/2023 0403   BUN 28 (H) 02/09/2023 0403   CREATININE 1.08 02/09/2023 0403   CALCIUM 9.2 02/09/2023 0403   GFRNONAA >60 02/09/2023 0403   GFRAA >60 12/27/2019 1108   Estimated Creatinine Clearance: 81.6 mL/min (by C-G formula based on SCr of 1.08 mg/dL).  COAG Lab Results  Component Value Date   INR 1.2 02/05/2023   INR 1.1 12/02/2021   INR 1.0 11/08/2020    Radiology US ARTERIAL ABI (SCREENING LOWER EXTREMITY)  Result Date: 02/06/2023 CLINICAL DATA:  Peripheral artery disease EXAM: NONINVASIVE PHYSIOLOGIC VASCULAR STUDY OF BILATERAL LOWER EXTREMITIES TECHNIQUE: Evaluation of both lower extremities were performed at rest, including calculation of ankle-brachial indices with single level Doppler, pressure and pulse volume recording. COMPARISON:  None Available. FINDINGS: Right ABI:  1.1 Left ABI:  0.75 Right Lower Extremity:  Normal arterial waveforms at the ankle. Left Lower Extremity:  Diminished biphasic arterial waveforms. 0.5-0.79 Moderate PAD IMPRESSION:  1. Abnormal resting left ankle-brachial index and arterial waveforms consistent with at least moderate underlying peripheral arterial disease. 2. The resting right ankle-brachial index and arterial waveforms are normal. Electronically Signed   By: Malachy Moan M.D.   On: 02/06/2023 06:59   US Venous Img Upper Uni Left (DVT)  Result Date: 02/06/2023 CLINICAL DATA:  Left arm swelling EXAM: LEFT UPPER EXTREMITY VENOUS DOPPLER ULTRASOUND TECHNIQUE: Gray-scale sonography with graded compression, as well as color Doppler and duplex ultrasound were performed to evaluate the upper extremity deep venous system from the level of the subclavian vein and including the jugular, axillary, basilic, radial, ulnar and upper cephalic vein. Spectral Doppler was utilized to evaluate flow at rest and with distal augmentation maneuvers. COMPARISON:  None Available. FINDINGS: Contralateral Subclavian Vein: Respiratory phasicity is normal and symmetric with the symptomatic side. No evidence of thrombus. Normal compressibility. Internal Jugular Vein: No evidence of thrombus. Normal compressibility, respiratory phasicity and response to augmentation. Subclavian Vein: No evidence of thrombus. Normal compressibility, respiratory phasicity and response to augmentation. Axillary Vein: No evidence of thrombus. Normal compressibility, respiratory phasicity and response to augmentation. Cephalic Vein: No evidence of thrombus. Normal compressibility, respiratory phasicity and response to augmentation. Basilic Vein: No evidence of thrombus. Normal compressibility, respiratory phasicity and response to augmentation. Brachial Veins: No evidence of thrombus. Normal compressibility, respiratory phasicity and response to augmentation. Radial Veins: No evidence of thrombus. Normal compressibility, respiratory phasicity and response to augmentation. Ulnar Veins: No evidence of thrombus. Normal compressibility, respiratory phasicity and response to  augmentation. Venous Reflux:  None visualized. Other Findings: Edema seen within the subcutaneous soft tissues of the left forearm. IMPRESSION: No evidence of DVT within the left upper extremity. Electronically Signed   By: Charlett Nose M.D.   On: 02/06/2023 00:23   MR FOOT LEFT WO CONTRAST  Result Date: 02/05/2023 CLINICAL DATA:  Diabetic ulcer on left great toe. Serosanguineous drainage. No recent fever. Soft tissue infection suspected. EXAM: MRI OF THE LEFT FOOT WITHOUT CONTRAST TECHNIQUE: Multiplanar, multisequence MR imaging of the left forefoot was performed. No intravenous contrast was administered. COMPARISON:  None Available. FINDINGS: Bones/Joint/Cartilage There is cortical destruction of the head of the distal 1st phalanx, best seen on the coronal images. Associated decreased T1 and increased T2 marrow signal. No significant interphalangeal joint effusion. The proximal phalanx appears intact. The additional toes appear intact. There are moderate degenerative changes at the 1st metatarsophalangeal joint. The visualized metatarsals appear intact. No significant arthropathy at the Lisfranc joint. Ligaments Intact Lisfranc ligament. The collateral ligaments of the metatarsophalangeal joints appear intact. Muscles and Tendons Generalized forefoot muscular atrophy with mild nonspecific muscular edema. The forefoot tendons appear intact, without significant tenosynovitis. Soft tissues Generalized soft tissue swelling in the great toe with apparent ulceration along the plantar aspect of the distal phalanx. There is a small amount of heterogeneous fluid surrounding the distal phalanx, but no well-defined abscess. No other focal soft tissue abnormalities are identified. IMPRESSION: 1. Findings are consistent with osteomyelitis of the distal 1st phalanx. 2. Generalized soft tissue swelling in the great toe with apparent ulceration along the plantar aspect of the distal phalanx. No well-defined abscess. 3.  Moderate degenerative changes at the 1st metatarsophalangeal joint. 4. Generalized forefoot muscular atrophy. Electronically Signed   By: Carey Bullocks M.D.   On: 02/05/2023 15:45   DG Chest 2 View  Result Date: 02/05/2023 CLINICAL DATA:  Diabetic ulcer.  Concern for sepsis. EXAM: CHEST - 2 VIEW COMPARISON:  Chest radiograph dated December 02, 2021. FINDINGS: Stable cardiomegaly. The mediastinal contours are within normal limits. Aortic calcification. Bilateral interstitial prominence, most pronounced at the lung bases. No pneumothorax or pleural effusion. No acute osseous abnormality. IMPRESSION: Cardiomegaly with findings suggestive of pulmonary vascular congestion. Electronically Signed   By: Hart Robinsons M.D.   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  This represents a limb threatening ischemia and places the patient at the risk for left limb loss.  Patient should undergo angiography of the left lower extremity with the hope for intervention for limb salvage.  The risks and benefits as well as the alternative therapies was discussed in detail with the patient.  All questions were answered.  Patient agrees to proceed with left angiography.  The patient will follow up with me in the office after the procedure.   2.  Hypertension: Continue antihypertensive medications as already ordered, these medications have been reviewed and there are no changes at this time.    3.  Diabetes mellitus: Continue hypoglycemic medications as already ordered, these medications have been reviewed and there are no changes at this time.  Hgb A1C to be monitored as already arranged by primary service.    4.  Hyperlipidemia: Continue statin as ordered  and reviewed, no changes at this time  Levora Dredge, MD  02/09/2023 6:48 PM

## 2023-02-09 NOTE — H&P (View-Only) (Signed)
MRN : [id number]  Kristopher Oneal is a 72 y.o. (1950/08/15) male who presents with chief complaint of check circulation.  History of Present Illness:   Patient resting comfortably in bed.  He denies severe pain in the left foot.  Current Meds  Medication Sig   diltiazem (CARDIZEM CD) 300 MG 24 hr capsule Take 1 capsule (300 mg total) by mouth at bedtime.   ELIQUIS 5 MG TABS tablet Take 1 tablet (5 mg total) by mouth 2 (two) times daily.   fenofibrate 160 MG tablet Take 160 mg by mouth daily.    fluticasone (FLONASE) 50 MCG/ACT nasal spray Place 1 spray into the nose daily as needed for allergies.   furosemide (LASIX) 20 MG tablet Take 1 tablet (20 mg total) by mouth daily. (Patient taking differently: Take 20 mg by mouth as needed for edema.)   glipiZIDE (GLUCOTROL) 5 MG tablet Take 5 mg by mouth 2 (two) times daily.   hydroxychloroquine (PLAQUENIL) 200 MG tablet Take 200 mg by mouth 2 (two) times daily.   insulin NPH-regular Human (HUMULIN 70/30) (70-30) 100 UNIT/ML injection Inject 25-50 Units into the skin See admin instructions. Inject 50 units into the skin in the morning and 30 units in the evening   lovastatin (MEVACOR) 40 MG tablet Take 1 tablet (40 mg total) by mouth daily with supper.   metFORMIN (GLUCOPHAGE) 1000 MG tablet Take 1,000 mg by mouth 2 (two) times daily with a meal.    metoprolol succinate (TOPROL-XL) 100 MG 24 hr tablet Take 1 tablet (100 mg total) by mouth at bedtime.   Multiple Vitamins-Minerals (ADVANCED DIABETIC MULTIVITAMIN) TABS Take 1 tablet by mouth daily.   mupirocin ointment (BACTROBAN) 2 % Apply 1 Application topically daily.   sildenafil (VIAGRA) 100 MG tablet Take 100 mg by mouth daily as needed.   triamterene-hydrochlorothiazide (DYAZIDE) 37.5-25 MG capsule Take 1 capsule by mouth daily.   venlafaxine XR (EFFEXOR-XR) 75 MG 24 hr capsule Take 75 mg by mouth daily with breakfast.    vitamin B-12 (CYANOCOBALAMIN) 500 MCG tablet Take 1,000 mcg by mouth daily.   [DISCONTINUED] triamterene-hydrochlorothiazide (MAXZIDE-25) 37.5-25 MG tablet Take 1 tablet by mouth daily.   [DISCONTINUED] venlafaxine XR (EFFEXOR-XR) 37.5 MG 24 hr capsule Take 37.5 mg by mouth daily with breakfast.    Past Medical History:  Diagnosis Date   Anxiety    Arrhythmia    atrial fibrillation   Arthritis    CHF (congestive heart failure) (HCC)    Diabetes mellitus without complication (HCC)    type 2   Dysrhythmia    PVC/ a fib   Heart murmur    High cholesterol    Hypertension     Past Surgical History:  Procedure Laterality Date   BACK SURGERY     l5/s1   COLONOSCOPY     KNEE ARTHROPLASTY Right 01/02/2020   Procedure: COMPUTER ASSISTED TOTAL KNEE ARTHROPLASTY;  Surgeon: Donato Heinz, MD;  Location: [facility];  Service: Orthopedics;  Laterality: Right;   KNEE ARTHROPLASTY Left 11/19/2020   Procedure: COMPUTER ASSISTED TOTAL KNEE ARTHROPLASTY;  Surgeon: Francesco Sor  P, MD;  Location: [facility];  Service: Orthopedics;  Laterality: Left;   ORIF PATELLA Left 04/16/2021   Procedure: OPEN REDUCTION INTERNAL (ORIF) FIXATION PATELLA;  Surgeon: Donato Heinz, MD;  Location: [facility];  Service: Orthopedics;  Laterality: Left;   ORIF PATELLA Left 05/10/2021   Procedure: OPEN REDUCTION INTERNAL (ORIF) FIXATION PATELLA;  Surgeon: Donato Heinz, MD;  Location: [facility];  Service: Orthopedics;  Laterality: Left;    Social History Social History   Tobacco Use   Smoking status: Former    Current packs/day: 0.00    Types: Cigarettes    Quit date: 1980    Years since quitting: 44.8   Smokeless tobacco: Never   Tobacco comments:    1980  Vaping Use   Vaping status: Never Used  Substance Use Topics   Alcohol use: Yes    Comment: rarely   Drug use: Never    Family History Family History  Problem Relation Age of Onset   Dementia Mother    Heart attack Father    Emphysema Father     Heart attack Brother     Allergies  Allergen Reactions   Gabapentin Other (See Comments)    Confusion     REVIEW OF SYSTEMS (Negative unless checked)  Constitutional: [] Weight loss  [] Fever  [] Chills Cardiac: [] Chest pain   [] Chest pressure   [] Palpitations   [] Shortness of breath when laying flat   [] Shortness of breath with exertion. Vascular:  [x] Pain in legs with walking   [] Pain in legs at rest  [] History of DVT   [] Phlebitis   [] Swelling in legs   [] Varicose veins   [] Non-healing ulcers Pulmonary:   [] Uses home oxygen   [] Productive cough   [] Hemoptysis   [] Wheeze  [] COPD   [] Asthma Neurologic:  [] Dizziness   [] Seizures   [] History of stroke   [] History of TIA  [] Aphasia   [] Vissual changes   [] Weakness or numbness in arm   [] Weakness or numbness in leg Musculoskeletal:   [] Joint swelling   [] Joint pain   [] Low back pain Hematologic:  [] Easy bruising  [] Easy bleeding   [] Hypercoagulable state   [] Anemic Gastrointestinal:  [] Diarrhea   [] Vomiting  [] Gastroesophageal reflux/heartburn   [] Difficulty swallowing. Genitourinary:  [] Chronic kidney disease   [] Difficult urination  [] Frequent urination   [] Blood in urine Skin:  [] Rashes   [] Ulcers  Psychological:  [] History of anxiety   []  History of major depression.  Physical Examination  Vitals:   02/08/23 1509 02/08/23 2155 02/09/23 0752 02/09/23 1310  BP: 117/60 126/68 116/78 126/64  Pulse: 86 97 72 82  Resp: 16 18 18 18   Temp: 97.9 F (36.6 C) 98.1 F (36.7 C) 98.3 F (36.8 C) 98.5 F (36.9 C)  TempSrc:   Oral Oral  SpO2: 96% 97% 96% 96%  Weight:      Height:       Body mass index is 33.13 kg/m. Gen: WD/WN, NAD Head: [place]/AT, No temporalis wasting.  Ear/Nose/Throat: Hearing grossly intact, nares w/o erythema or drainage Eyes: PER, EOMI, sclera nonicteric.  Neck: Supple, no masses.  No bruit or JVD.  Pulmonary:  Good air movement, no audible wheezing, no use of accessory muscles.  Cardiac: RRR, normal S1, S2, no  Murmurs. Vascular:  mild trophic changes,  open wounds left great toe Vessel Right Left  Radial Palpable Palpable  PT Not Palpable Not Palpable  DP Not Palpable Not Palpable  Gastrointestinal: soft, non-distended. No guarding/no peritoneal signs.  Musculoskeletal: M/S 5/5  throughout.  No visible deformity.  Neurologic: CN 2-12 intact. Pain and light touch intact in extremities.  Symmetrical.  Speech is fluent. Motor exam as listed above. Psychiatric: Judgment intact, Mood & affect appropriate for pt's clinical situation. Dermatologic: No rashes or ulcers noted.  No changes consistent with cellulitis.   CBC Lab Results  Component Value Date   WBC 11.1 (H) 02/09/2023   HGB 13.8 02/09/2023   HCT 40.9 02/09/2023   MCV 90.3 02/09/2023   PLT 344 02/09/2023    BMET    Component Value Date/Time   NA 133 (L) 02/09/2023 0403   K 4.0 02/09/2023 0403   CL 102 02/09/2023 0403   CO2 22 02/09/2023 0403   GLUCOSE 138 (H) 02/09/2023 0403   BUN 28 (H) 02/09/2023 0403   CREATININE 1.08 02/09/2023 0403   CALCIUM 9.2 02/09/2023 0403   GFRNONAA >60 02/09/2023 0403   GFRAA >60 12/27/2019 1108   Estimated Creatinine Clearance: 81.6 mL/min (by C-G formula based on SCr of 1.08 mg/dL).  COAG Lab Results  Component Value Date   INR 1.2 02/05/2023   INR 1.1 12/02/2021   INR 1.0 11/08/2020    Radiology US ARTERIAL ABI (SCREENING LOWER EXTREMITY)  Result Date: 02/06/2023 CLINICAL DATA:  Peripheral artery disease EXAM: NONINVASIVE PHYSIOLOGIC VASCULAR STUDY OF BILATERAL LOWER EXTREMITIES TECHNIQUE: Evaluation of both lower extremities were performed at rest, including calculation of ankle-brachial indices with single level Doppler, pressure and pulse volume recording. COMPARISON:  None Available. FINDINGS: Right ABI:  1.1 Left ABI:  0.75 Right Lower Extremity:  Normal arterial waveforms at the ankle. Left Lower Extremity:  Diminished biphasic arterial waveforms. 0.5-0.79 Moderate PAD IMPRESSION:  1. Abnormal resting left ankle-brachial index and arterial waveforms consistent with at least moderate underlying peripheral arterial disease. 2. The resting right ankle-brachial index and arterial waveforms are normal. Electronically Signed   By: Malachy Moan M.D.   On: 02/06/2023 06:59   US Venous Img Upper Uni Left (DVT)  Result Date: 02/06/2023 CLINICAL DATA:  Left arm swelling EXAM: LEFT UPPER EXTREMITY VENOUS DOPPLER ULTRASOUND TECHNIQUE: Gray-scale sonography with graded compression, as well as color Doppler and duplex ultrasound were performed to evaluate the upper extremity deep venous system from the level of the subclavian vein and including the jugular, axillary, basilic, radial, ulnar and upper cephalic vein. Spectral Doppler was utilized to evaluate flow at rest and with distal augmentation maneuvers. COMPARISON:  None Available. FINDINGS: Contralateral Subclavian Vein: Respiratory phasicity is normal and symmetric with the symptomatic side. No evidence of thrombus. Normal compressibility. Internal Jugular Vein: No evidence of thrombus. Normal compressibility, respiratory phasicity and response to augmentation. Subclavian Vein: No evidence of thrombus. Normal compressibility, respiratory phasicity and response to augmentation. Axillary Vein: No evidence of thrombus. Normal compressibility, respiratory phasicity and response to augmentation. Cephalic Vein: No evidence of thrombus. Normal compressibility, respiratory phasicity and response to augmentation. Basilic Vein: No evidence of thrombus. Normal compressibility, respiratory phasicity and response to augmentation. Brachial Veins: No evidence of thrombus. Normal compressibility, respiratory phasicity and response to augmentation. Radial Veins: No evidence of thrombus. Normal compressibility, respiratory phasicity and response to augmentation. Ulnar Veins: No evidence of thrombus. Normal compressibility, respiratory phasicity and response to  augmentation. Venous Reflux:  None visualized. Other Findings: Edema seen within the subcutaneous soft tissues of the left forearm. IMPRESSION: No evidence of DVT within the left upper extremity. Electronically Signed   By: Charlett Nose M.D.   On: 02/06/2023 00:23   MR FOOT LEFT WO CONTRAST  Result Date: 02/05/2023 CLINICAL DATA:  Diabetic ulcer on left great toe. Serosanguineous drainage. No recent fever. Soft tissue infection suspected. EXAM: MRI OF THE LEFT FOOT WITHOUT CONTRAST TECHNIQUE: Multiplanar, multisequence MR imaging of the left forefoot was performed. No intravenous contrast was administered. COMPARISON:  None Available. FINDINGS: Bones/Joint/Cartilage There is cortical destruction of the head of the distal 1st phalanx, best seen on the coronal images. Associated decreased T1 and increased T2 marrow signal. No significant interphalangeal joint effusion. The proximal phalanx appears intact. The additional toes appear intact. There are moderate degenerative changes at the 1st metatarsophalangeal joint. The visualized metatarsals appear intact. No significant arthropathy at the Lisfranc joint. Ligaments Intact Lisfranc ligament. The collateral ligaments of the metatarsophalangeal joints appear intact. Muscles and Tendons Generalized forefoot muscular atrophy with mild nonspecific muscular edema. The forefoot tendons appear intact, without significant tenosynovitis. Soft tissues Generalized soft tissue swelling in the great toe with apparent ulceration along the plantar aspect of the distal phalanx. There is a small amount of heterogeneous fluid surrounding the distal phalanx, but no well-defined abscess. No other focal soft tissue abnormalities are identified. IMPRESSION: 1. Findings are consistent with osteomyelitis of the distal 1st phalanx. 2. Generalized soft tissue swelling in the great toe with apparent ulceration along the plantar aspect of the distal phalanx. No well-defined abscess. 3.  Moderate degenerative changes at the 1st metatarsophalangeal joint. 4. Generalized forefoot muscular atrophy. Electronically Signed   By: Carey Bullocks M.D.   On: 02/05/2023 15:45   DG Chest 2 View  Result Date: 02/05/2023 CLINICAL DATA:  Diabetic ulcer.  Concern for sepsis. EXAM: CHEST - 2 VIEW COMPARISON:  Chest radiograph dated December 02, 2021. FINDINGS: Stable cardiomegaly. The mediastinal contours are within normal limits. Aortic calcification. Bilateral interstitial prominence, most pronounced at the lung bases. No pneumothorax or pleural effusion. No acute osseous abnormality. IMPRESSION: Cardiomegaly with findings suggestive of pulmonary vascular congestion. Electronically Signed   By: Hart Robinsons M.D.   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  This represents a limb threatening ischemia and places the patient at the risk for left limb loss.  Patient should undergo angiography of the left lower extremity with the hope for intervention for limb salvage.  The risks and benefits as well as the alternative therapies was discussed in detail with the patient.  All questions were answered.  Patient agrees to proceed with left angiography.  The patient will follow up with me in the office after the procedure.   2.  Hypertension: Continue antihypertensive medications as already ordered, these medications have been reviewed and there are no changes at this time.    3.  Diabetes mellitus: Continue hypoglycemic medications as already ordered, these medications have been reviewed and there are no changes at this time.  Hgb A1C to be monitored as already arranged by primary service.    4.  Hyperlipidemia: Continue statin as ordered  and reviewed, no changes at this time  Levora Dredge, MD  02/09/2023 6:48 PM

## 2023-02-09 NOTE — TOC Initial Note (Signed)
Transition of Care [facility]) - Initial/Assessment Note    Patient Details  Name: Kristopher Oneal MRN: [id number] Date of Birth: Oct 24, 1950  [facility] CM/SW Contact:    Marlowe Sax, RN Phone Number: 02/09/2023, 8:48 AM  Clinical Narrative:                  angiogram on Tuesday followed by left hallux amputation  TOC will follow and assist with DC planning  Lives at home with spouse in a 5th wheel camper Has a RW and 3 in1, camper bathroom tight quarters   Independent at home, still driving   Patient Goals and CMS Choice            Expected Discharge Plan and Services                                              Prior Living Arrangements/Services                       Activities of Daily Living   ADL Screening (condition at time of admission) Independently performs ADLs?: Yes (appropriate for developmental age) Is the patient deaf or have difficulty hearing?: Yes Does the patient have difficulty seeing, even when wearing glasses/contacts?: No Does the patient have difficulty concentrating, remembering, or making decisions?: No  Permission Sought/Granted                  Emotional Assessment              Admission diagnosis:  Foot osteomyelitis, left (HCC) [M86.9] Patient Active Problem List   Diagnosis Date Noted   Foot osteomyelitis, left (HCC) 02/05/2023   Inflammatory arthritis 02/05/2023   Left arm swelling 02/05/2023   Hypokalemia 12/03/2021   Acute CHF (HCC) 12/02/2021   Chronic atrial fibrillation (HCC) 12/02/2021   HTN (hypertension) 12/02/2021   HLD (hyperlipidemia) 12/02/2021   Diabetes mellitus without complication (HCC) 12/02/2021   Leukocytosis 12/02/2021   Obesity with body mass index (BMI) of 30.0 to 39.9 12/02/2021   Hypomagnesemia 12/02/2021   S/P ORIF (open reduction internal fixation) fracture 05/10/2021   Patella fracture 04/16/2021   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/19/2020    Status post total right knee replacement 01/18/2020   Atrial fibrillation and flutter (HCC) 07/09/2018   DM type 2 with diabetic mixed hyperlipidemia (HCC) 02/23/2018   B12 deficiency 07/17/2016   Benign essential hypertension 07/17/2016   Chronic painful diabetic neuropathy (HCC) 07/17/2016   Hyperlipidemia, mixed 07/17/2016   Symptomatic PVCs 07/17/2016   PCP:  Danella Penton, MD Pharmacy:   [facility] [address]  - [address] [place] [address] Phone: [phone] Fax: [phone]  [facility] - [place] - [address] [place] [address] Phone: [phone] Fax: [phone]     Social Determinants of Health (SDOH) Social History: SDOH Screenings   Food Insecurity: No Food Insecurity (02/05/2023)  Housing: Low Risk  (02/05/2023)  Transportation Needs: No Transportation Needs (02/05/2023)  Utilities: Not At Risk (02/05/2023)  Depression (PHQ2-9): Low Risk  (12/27/2021)  Financial Resource Strain: Low Risk  (06/10/2022)   Received from [facility], [facility]  Tobacco Use: Medium Risk (02/05/2023)   SDOH Interventions:  Readmission Risk Interventions     No data to display

## 2023-02-10 ENCOUNTER — Encounter
Admission: EM | Disposition: A | Discharge status: Home / Self Care | Payer: Self-pay | Source: Ambulatory Visit | Attending: Internal Medicine

## 2023-02-10 ENCOUNTER — Encounter: Payer: Self-pay | Admitting: Internal Medicine

## 2023-02-10 ENCOUNTER — Inpatient Hospital Stay: Payer: Medicare Other | Admitting: Certified Registered"

## 2023-02-10 DIAGNOSIS — M869 Osteomyelitis, unspecified: Secondary | ICD-10-CM

## 2023-02-10 DIAGNOSIS — I700 Atherosclerosis of aorta: Secondary | ICD-10-CM | POA: Diagnosis not present

## 2023-02-10 DIAGNOSIS — I70245 Atherosclerosis of native arteries of left leg with ulceration of other part of foot: Secondary | ICD-10-CM | POA: Diagnosis not present

## 2023-02-10 DIAGNOSIS — M86272 Subacute osteomyelitis, left ankle and foot: Secondary | ICD-10-CM | POA: Diagnosis not present

## 2023-02-10 DIAGNOSIS — L97529 Non-pressure chronic ulcer of other part of left foot with unspecified severity: Secondary | ICD-10-CM | POA: Diagnosis not present

## 2023-02-10 HISTORY — PX: AMPUTATION TOE: SHX6595

## 2023-02-10 HISTORY — PX: LOWER EXTREMITY ANGIOGRAPHY: CATH118251

## 2023-02-10 LAB — VANCOMYCIN, TROUGH
Vancomycin Tr: 12 ug/mL — ABNORMAL LOW (ref 15–20)
Vancomycin Tr: 13 ug/mL — ABNORMAL LOW (ref 15–20)

## 2023-02-10 LAB — CBC
HCT: 42.5 % (ref 39.0–52.0)
Hemoglobin: 14.4 g/dL (ref 13.0–17.0)
MCH: 30.9 pg (ref 26.0–34.0)
MCHC: 33.9 g/dL (ref 30.0–36.0)
MCV: 91.2 fL (ref 80.0–100.0)
Platelets: 368 10*3/uL (ref 150–400)
RBC: 4.66 MIL/uL (ref 4.22–5.81)
RDW: 13.2 % (ref 11.5–15.5)
WBC: 11.7 10*3/uL — ABNORMAL HIGH (ref 4.0–10.5)
nRBC: 0 % (ref 0.0–0.2)

## 2023-02-10 LAB — CULTURE, BLOOD (ROUTINE X 2)
Culture: NO GROWTH
Culture: NO GROWTH

## 2023-02-10 LAB — GLUCOSE, CAPILLARY
Glucose-Capillary: 114 mg/dL — ABNORMAL HIGH (ref 70–99)
Glucose-Capillary: 106 mg/dL — ABNORMAL HIGH (ref 70–99)
Glucose-Capillary: 104 mg/dL — ABNORMAL HIGH (ref 70–99)
Glucose-Capillary: 108 mg/dL — ABNORMAL HIGH (ref 70–99)
Glucose-Capillary: 121 mg/dL — ABNORMAL HIGH (ref 70–99)
Glucose-Capillary: 186 mg/dL — ABNORMAL HIGH (ref 70–99)
Glucose-Capillary: 236 mg/dL — ABNORMAL HIGH (ref 70–99)

## 2023-02-10 LAB — BASIC METABOLIC PANEL
Anion gap: 9 (ref 5–15)
BUN: 28 mg/dL — ABNORMAL HIGH (ref 8–23)
CO2: 26 mmol/L (ref 22–32)
Calcium: 9.4 mg/dL (ref 8.9–10.3)
Chloride: 99 mmol/L (ref 98–111)
Creatinine, Ser: 1.16 mg/dL (ref 0.61–1.24)
GFR, Estimated: >60 mL/min (ref >60)
Glucose, Bld: 124 mg/dL — ABNORMAL HIGH (ref 70–99)
Potassium: 4.3 mmol/L (ref 3.5–5.1)
Sodium: 134 mmol/L — ABNORMAL LOW (ref 135–145)

## 2023-02-10 LAB — MAGNESIUM: Magnesium: 2.3 mg/dL (ref 1.7–2.4)

## 2023-02-10 SURGERY — AMPUTATION, TOE
Anesthesia: General | Site: Toe | Laterality: Left

## 2023-02-10 SURGERY — LOWER EXTREMITY ANGIOGRAPHY
Anesthesia: Moderate Sedation | Laterality: Left

## 2023-02-10 MED ORDER — LIDOCAINE HCL (PF) 1 % IJ SOLN
INTRAMUSCULAR | Status: DC | PRN
  Administered 2023-02-10: 10 mL

## 2023-02-10 MED ORDER — HYDRALAZINE HCL 20 MG/ML IJ SOLN: 5.0000 mg | INTRAMUSCULAR | Status: DC | PRN

## 2023-02-10 MED ORDER — FENTANYL CITRATE (PF) 100 MCG/2ML IJ SOLN
INTRAMUSCULAR | Status: DC | PRN
  Administered 2023-02-10: 50 ug via INTRAVENOUS
  Administered 2023-02-10 (×2): 25 ug via INTRAVENOUS

## 2023-02-10 MED ORDER — ACETAMINOPHEN 10 MG/ML IV SOLN: 1000.0000 mg | Freq: Once | INTRAVENOUS | Status: DC | PRN

## 2023-02-10 MED ORDER — ACETAMINOPHEN 325 MG PO TABS: 650.0000 mg | ORAL_TABLET | ORAL | Status: DC | PRN

## 2023-02-10 MED ORDER — HEPARIN SODIUM (PORCINE) 1000 UNIT/ML IJ SOLN
INTRAMUSCULAR | Status: DC | PRN
  Administered 2023-02-10: 7000 [IU] via INTRAVENOUS

## 2023-02-10 MED ORDER — LABETALOL HCL 5 MG/ML IV SOLN: 10.0000 mg | INTRAVENOUS | Status: DC | PRN

## 2023-02-10 MED ORDER — HEPARIN (PORCINE) IN NACL 1000-0.9 UT/500ML-% IV SOLN
INTRAVENOUS | Status: DC | PRN
  Administered 2023-02-10: 1000 mL

## 2023-02-10 MED ORDER — FENTANYL CITRATE (PF) 100 MCG/2ML IJ SOLN
INTRAMUSCULAR | Status: AC
  Filled 2023-02-10: qty 2

## 2023-02-10 MED ORDER — LIDOCAINE HCL (PF) 2 % IJ SOLN
INTRAMUSCULAR | Status: AC
  Filled 2023-02-10: qty 5

## 2023-02-10 MED ORDER — CEFAZOLIN SODIUM-DEXTROSE 2-3 GM-%(50ML) IV SOLR
INTRAVENOUS | Status: DC | PRN
  Administered 2023-02-10: 2 g via INTRAVENOUS

## 2023-02-10 MED ORDER — CLOPIDOGREL BISULFATE 75 MG PO TABS
ORAL_TABLET | ORAL | Status: AC
  Filled 2023-02-10: qty 2

## 2023-02-10 MED ORDER — MORPHINE SULFATE (PF) 4 MG/ML IV SOLN: 2.0000 mg | INTRAVENOUS | Status: DC | PRN

## 2023-02-10 MED ORDER — FENTANYL CITRATE (PF) 100 MCG/2ML IJ SOLN
INTRAMUSCULAR | Status: DC | PRN
  Administered 2023-02-10 (×2): 25 ug via INTRAVENOUS

## 2023-02-10 MED ORDER — BUPIVACAINE HCL (PF) 0.5 % IJ SOLN
INTRAMUSCULAR | Status: DC | PRN
  Administered 2023-02-10: 10 mL

## 2023-02-10 MED ORDER — CHLORHEXIDINE GLUCONATE 0.12 % MT SOLN
OROMUCOSAL | Status: AC
  Filled 2023-02-10: qty 15

## 2023-02-10 MED ORDER — HEPARIN SODIUM (PORCINE) 1000 UNIT/ML IJ SOLN
INTRAMUSCULAR | Status: AC
  Filled 2023-02-10: qty 10

## 2023-02-10 MED ORDER — ONDANSETRON HCL 4 MG/2ML IJ SOLN: 4.0000 mg | Freq: Once | INTRAMUSCULAR | Status: DC | PRN

## 2023-02-10 MED ORDER — SODIUM CHLORIDE 0.9 % IV SOLN: INTRAVENOUS | Status: AC

## 2023-02-10 MED ORDER — EPHEDRINE 5 MG/ML INJ
INTRAVENOUS | Status: AC
Start: 2023-02-10 — End: ?
  Filled 2023-02-10: qty 5

## 2023-02-10 MED ORDER — SODIUM CHLORIDE 0.9% FLUSH
3.0000 mL | Freq: Two times a day (BID) | INTRAVENOUS | Status: DC
  Administered 2023-02-10 – 2023-02-11 (×2): 3 mL via INTRAVENOUS

## 2023-02-10 MED ORDER — CHLORHEXIDINE GLUCONATE 0.12 % MT SOLN: 15.0000 mL | Freq: Once | OROMUCOSAL | Status: DC

## 2023-02-10 MED ORDER — MIDAZOLAM HCL 5 MG/5ML IJ SOLN
INTRAMUSCULAR | Status: AC
  Filled 2023-02-10: qty 5

## 2023-02-10 MED ORDER — MIDAZOLAM HCL 2 MG/2ML IJ SOLN
INTRAMUSCULAR | Status: DC | PRN
  Administered 2023-02-10 (×2): .5 mg via INTRAVENOUS
  Administered 2023-02-10: 2 mg via INTRAVENOUS

## 2023-02-10 MED ORDER — BUPIVACAINE HCL (PF) 0.5 % IJ SOLN
INTRAMUSCULAR | Status: AC
Start: 2023-02-10 — End: ?
  Filled 2023-02-10: qty 30

## 2023-02-10 MED ORDER — LIDOCAINE HCL (CARDIAC) PF 100 MG/5ML IV SOSY
PREFILLED_SYRINGE | INTRAVENOUS | Status: DC | PRN
  Administered 2023-02-10: 100 mg via INTRAVENOUS

## 2023-02-10 MED ORDER — PHENYLEPHRINE 80 MCG/ML (10ML) SYRINGE FOR IV PUSH (FOR BLOOD PRESSURE SUPPORT)
PREFILLED_SYRINGE | INTRAVENOUS | Status: AC
  Filled 2023-02-10: qty 10

## 2023-02-10 MED ORDER — PROPOFOL 10 MG/ML IV BOLUS
INTRAVENOUS | Status: AC
Start: 2023-02-10 — End: ?
  Filled 2023-02-10: qty 20

## 2023-02-10 MED ORDER — PHENYLEPHRINE 80 MCG/ML (10ML) SYRINGE FOR IV PUSH (FOR BLOOD PRESSURE SUPPORT)
PREFILLED_SYRINGE | INTRAVENOUS | Status: DC | PRN
  Administered 2023-02-10 (×6): 80 ug via INTRAVENOUS

## 2023-02-10 MED ORDER — CLOPIDOGREL BISULFATE 75 MG PO TABS
150.0000 mg | ORAL_TABLET | ORAL | Status: AC
  Administered 2023-02-10: 150 mg via ORAL

## 2023-02-10 MED ORDER — OXYCODONE HCL 5 MG PO TABS: 5.0000 mg | ORAL_TABLET | ORAL | Status: DC | PRN

## 2023-02-10 MED ORDER — SODIUM CHLORIDE 0.9% FLUSH: 3.0000 mL | INTRAVENOUS | Status: DC | PRN

## 2023-02-10 MED ORDER — CLOPIDOGREL BISULFATE 75 MG PO TABS
75.0000 mg | ORAL_TABLET | Freq: Every day | ORAL | Status: DC
  Administered 2023-02-11: 75 mg via ORAL
  Filled 2023-02-10: qty 1

## 2023-02-10 MED ORDER — OXYCODONE HCL 5 MG PO TABS: 5.0000 mg | ORAL_TABLET | Freq: Once | ORAL | Status: DC | PRN

## 2023-02-10 MED ORDER — LACTATED RINGERS IV SOLN: INTRAVENOUS | Status: DC

## 2023-02-10 MED ORDER — FENTANYL CITRATE (PF) 100 MCG/2ML IJ SOLN
25.0000 ug | INTRAMUSCULAR | Status: DC | PRN
Start: 2023-02-10 — End: 2023-02-10

## 2023-02-10 MED ORDER — ORAL CARE MOUTH RINSE: 15.0000 mL | Freq: Once | OROMUCOSAL | Status: DC

## 2023-02-10 MED ORDER — PROPOFOL 10 MG/ML IV BOLUS
INTRAVENOUS | Status: AC
  Filled 2023-02-10: qty 20

## 2023-02-10 MED ORDER — SODIUM CHLORIDE 0.9 % IV SOLN
250.0000 mL | INTRAVENOUS | Status: AC | PRN
Start: 2023-02-10 — End: 2023-02-11

## 2023-02-10 MED ORDER — IODIXANOL 320 MG/ML IV SOLN
INTRAVENOUS | Status: DC | PRN
  Administered 2023-02-10: 60 mL

## 2023-02-10 MED ORDER — PROPOFOL 500 MG/50ML IV EMUL
INTRAVENOUS | Status: DC | PRN
  Administered 2023-02-10: 80 ug/kg/min via INTRAVENOUS
  Administered 2023-02-10: 30 mg via INTRAVENOUS

## 2023-02-10 MED ORDER — CEFAZOLIN SODIUM-DEXTROSE 2-4 GM/100ML-% IV SOLN
INTRAVENOUS | Status: AC
  Filled 2023-02-10: qty 100

## 2023-02-10 MED ORDER — OXYCODONE HCL 5 MG/5ML PO SOLN: 5.0000 mg | Freq: Once | ORAL | Status: DC | PRN

## 2023-02-10 MED ORDER — MORPHINE SULFATE (PF) 2 MG/ML IV SOLN: 2.0000 mg | INTRAVENOUS | Status: DC | PRN

## 2023-02-10 MED ORDER — EPHEDRINE SULFATE-NACL 50-0.9 MG/10ML-% IV SOSY
PREFILLED_SYRINGE | INTRAVENOUS | Status: DC | PRN
  Administered 2023-02-10: 15 mg via INTRAVENOUS
  Administered 2023-02-10: 5 mg via INTRAVENOUS

## 2023-02-10 SURGICAL SUPPLY — 20 items
BALLN LUTONIX 018 4X100X130 (BALLOONS) ×1
BALLOON LUTONIX 018 4X100X130 (BALLOONS) IMPLANT
CATH ANGIO 5F PIGTAIL 65CM (CATHETERS) IMPLANT
CATH VERT 5FR 125CM (CATHETERS) IMPLANT
COVER PROBE ULTRASOUND 5X96 (MISCELLANEOUS) IMPLANT
DEVICE STARCLOSE SE CLOSURE (Vascular Products) IMPLANT
GLIDEWIRE ADV .035X260CM (WIRE) IMPLANT
GOWN STRL REUS W/ TWL LRG LVL3 (GOWN DISPOSABLE) ×1 IMPLANT
GOWN STRL REUS W/TWL LRG LVL3 (GOWN DISPOSABLE) ×1
KIT ENCORE 26 ADVANTAGE (KITS) IMPLANT
NDL ENTRY 21GA 7CM ECHOTIP (NEEDLE) IMPLANT
NEEDLE ENTRY 21GA 7CM ECHOTIP (NEEDLE) ×1 IMPLANT
PACK ANGIOGRAPHY (CUSTOM PROCEDURE TRAY) ×1 IMPLANT
SET INTRO CAPELLA COAXIAL (SET/KITS/TRAYS/PACK) IMPLANT
SHEATH BRITE TIP 5FRX11 (SHEATH) IMPLANT
SHEATH RAABE 6FRX70 (SHEATH) IMPLANT
SYR MEDRAD MARK 7 150ML (SYRINGE) IMPLANT
TUBING CONTRAST HIGH PRESS 72 (TUBING) IMPLANT
WIRE G V18X300CM (WIRE) IMPLANT
WIRE GUIDERIGHT .035X150 (WIRE) IMPLANT

## 2023-02-10 SURGICAL SUPPLY — 47 items
BLADE MED AGGRESSIVE (BLADE) ×1 IMPLANT
BNDG CMPR 5X4 CHSV STRCH STRL (GAUZE/BANDAGES/DRESSINGS) ×1
BNDG CMPR STD VLCR NS LF 5.8X4 (GAUZE/BANDAGES/DRESSINGS) ×1
BNDG COHESIVE 4X5 TAN STRL LF (GAUZE/BANDAGES/DRESSINGS) ×1 IMPLANT
BNDG ELASTIC 4X5.8 VLCR NS LF (GAUZE/BANDAGES/DRESSINGS) ×1 IMPLANT
BNDG ESMARCH 4 X 12 STRL LF (GAUZE/BANDAGES/DRESSINGS)
BNDG ESMARCH 4X12 STRL LF (GAUZE/BANDAGES/DRESSINGS) ×1 IMPLANT
BNDG GAUZE DERMACEA FLUFF 4 (GAUZE/BANDAGES/DRESSINGS) ×1 IMPLANT
BNDG GZE DERMACEA 4 6PLY (GAUZE/BANDAGES/DRESSINGS) ×1
CUFF TOURN SGL QUICK 12 (TOURNIQUET CUFF) IMPLANT
CUFF TOURN SGL QUICK 18X4 (TOURNIQUET CUFF) IMPLANT
DRAIN PENROSE 12X.25 LTX STRL (MISCELLANEOUS) ×1 IMPLANT
DRAPE FLUOR MINI C-ARM 54X84 (DRAPES) ×1 IMPLANT
DRSG GAUZE FLUFF 36X18 (GAUZE/BANDAGES/DRESSINGS) ×1 IMPLANT
DURAPREP 26ML APPLICATOR (WOUND CARE) ×1 IMPLANT
ELECT REM PT RETURN 9FT ADLT (ELECTROSURGICAL) ×1
ELECTRODE REM PT RTRN 9FT ADLT (ELECTROSURGICAL) ×1 IMPLANT
GAUZE SPONGE 4X4 12PLY STRL (GAUZE/BANDAGES/DRESSINGS) ×1 IMPLANT
GAUZE XEROFORM 1X8 LF (GAUZE/BANDAGES/DRESSINGS) ×1 IMPLANT
GLOVE BIO SURGEON STRL SZ7.5 (GLOVE) ×1 IMPLANT
GLOVE INDICATOR 8.0 STRL GRN (GLOVE) ×1 IMPLANT
GOWN STRL REUS W/ TWL LRG LVL3 (GOWN DISPOSABLE) ×2 IMPLANT
GOWN STRL REUS W/TWL LRG LVL3 (GOWN DISPOSABLE) ×2
HANDLE YANKAUER SUCT BULB TIP (MISCELLANEOUS) ×1 IMPLANT
HANDPIECE VERSAJET DEBRIDEMENT (MISCELLANEOUS) ×1 IMPLANT
IV NS IRRIG 3000ML ARTHROMATIC (IV SOLUTION) ×1 IMPLANT
KIT TURNOVER KIT A (KITS) ×1 IMPLANT
LABEL OR SOLS (LABEL) ×1 IMPLANT
MANIFOLD NEPTUNE II (INSTRUMENTS) ×1 IMPLANT
NDL SAFETY ECLIPSE 18X1.5 (NEEDLE) ×1 IMPLANT
NS IRRIG 500ML POUR BTL (IV SOLUTION) ×1 IMPLANT
PACK EXTREMITY ARMC (MISCELLANEOUS) ×1 IMPLANT
PAD ABD DERMACEA PRESS 5X9 (GAUZE/BANDAGES/DRESSINGS) ×1 IMPLANT
SOL PREP PVP 2OZ (MISCELLANEOUS) ×1
SOLUTION PREP PVP 2OZ (MISCELLANEOUS) ×1 IMPLANT
SPONGE T-LAP 18X18 ~~LOC~~+RFID (SPONGE) ×1 IMPLANT
STAPLER SKIN PROX 35W (STAPLE) ×1 IMPLANT
STOCKINETTE STRL 6IN 960660 (GAUZE/BANDAGES/DRESSINGS) ×1 IMPLANT
STRAP SAFETY 5IN WIDE (MISCELLANEOUS) ×1 IMPLANT
SUT ETHILON 3-0 FS-10 30 BLK (SUTURE) ×1
SUT VIC AB 2-0 CT1 27 (SUTURE) ×1
SUT VIC AB 2-0 CT1 TAPERPNT 27 (SUTURE) ×2 IMPLANT
SUT VICRYL+ 3-0 36IN CT-1 (SUTURE) ×2 IMPLANT
SUTURE EHLN 3-0 FS-10 30 BLK (SUTURE) IMPLANT
SYR 10ML LL (SYRINGE) ×2 IMPLANT
TRAP FLUID SMOKE EVACUATOR (MISCELLANEOUS) ×1 IMPLANT
WATER STERILE IRR 500ML POUR (IV SOLUTION) ×1 IMPLANT

## 2023-02-10 NOTE — Plan of Care (Signed)
  Problem: Education: Goal: Knowledge of General Education information will improve Description: Including pain rating scale, medication(s)/side effects and non-pharmacologic comfort measures Outcome: Progressing   Problem: Clinical Measurements: Goal: Ability to maintain clinical measurements within normal limits will improve Outcome: Progressing   Problem: Clinical Measurements: Goal: Diagnostic test results will improve Outcome: Progressing   

## 2023-02-10 NOTE — Anesthesia Preprocedure Evaluation (Addendum)
Anesthesia Evaluation  Patient identified by MRN, date of birth, ID band Patient awake    Reviewed: Allergy & Precautions, NPO status , Patient's Chart, lab work & pertinent test results  History of Anesthesia Complications Negative for: history of anesthetic complications  Airway Mallampati: III   Neck ROM: Full    Dental   Bridges and crowns:   Pulmonary former smoker (quit 1980)   Pulmonary exam normal breath sounds clear to auscultation       Cardiovascular hypertension, +CHF  Normal cardiovascular exam+ dysrhythmias (a fib on Eliquis)  Rhythm:Regular Rate:Normal  ECG 02/05/23: A fib with RVR, HR 128 (NOTE HR has been in 70s-80s for greater than 24 hours)   Neuro/Psych  PSYCHIATRIC DISORDERS Anxiety     negative neurological ROS     GI/Hepatic negative GI ROS,,,  Endo/Other  diabetes, Type 2, Insulin Dependent  Obesity   Renal/GU      Musculoskeletal  (+) Arthritis ,    Abdominal   Peds  Hematology negative hematology ROS (+)   Anesthesia Other Findings Cardiology note 06/24/22:  Atrial fibrillation, permanent Dating back to 2020 per EKGs Recommend he continue rate control medications including diltiazem ER 300 mg daily and metoprolol succinate 100 daily, Eliquis 5 twice daily   Chronic diastolic CHF November 20, 2021 hospitalization for diastolic CHF exacerbation in the setting of high fluid and salt intake Recommend he continue Lasix 20 daily, creatinine stable 1.5, no edema Weight stable   Diabetes type 2 A1c running high, reports he often will miss the morning insulin Infection of toe, followed by podiatry, complications from diabetes, on antibiotics   Reproductive/Obstetrics                             Anesthesia Physical Anesthesia Plan  ASA: 3  Anesthesia Plan: General   Post-op Pain Management:    Induction: Intravenous  PONV Risk Score and Plan: 2 and Ondansetron,  Dexamethasone and Treatment may vary due to age or medical condition  Airway Management Planned: Oral ETT  Additional Equipment:   Intra-op Plan:   Post-operative Plan: Extubation in OR  Informed Consent: I have reviewed the patients History and Physical, chart, labs and discussed the procedure including the risks, benefits and alternatives for the proposed anesthesia with the patient or authorized representative who has indicated his/her understanding and acceptance.     Dental advisory given  Plan Discussed with: CRNA  Anesthesia Plan Comments: (Patient consented for risks of anesthesia including but not limited to:  - adverse reactions to medications - damage to eyes, teeth, lips or other oral mucosa - nerve damage due to positioning  - sore throat or hoarseness - damage to heart, brain, nerves, lungs, other parts of body or loss of life  Informed patient about role of CRNA in peri- and intra-operative care.  Patient voiced understanding.)        Anesthesia Quick Evaluation

## 2023-02-10 NOTE — Progress Notes (Addendum)
2956 Report given to Pre Op RN Marchelle Folks  1009 Received report from Robin in specials. Star closed to right groin pt will need to be flat until 11am and can get out of bed 1130am. Surgery scheduled for 1145pm   1100 Right groin incision soft and non tender. No drainage noted. Sensation to BLE. VSS  1120 Pt being transported to surgery at this time

## 2023-02-10 NOTE — Progress Notes (Signed)
PT Cancellation Note  Patient Details Name: Kristopher Oneal MRN: [id number] DOB: April 09, 1951   Cancelled Treatment:    Reason Eval/Treat Not Completed: Patient at procedure or test/unavailable Pt out of room for angiogram at this time, apparently also with forthcoming amputation.  Will look for continuation orders post procedures.  Malachi Pro, DPT 02/10/2023, 9:33 AM

## 2023-02-10 NOTE — Anesthesia Postprocedure Evaluation (Signed)
Anesthesia Post Note  Patient: Kristopher Oneal  Procedure(s) Performed: Left Hallux Amputation (Left: Toe)  Patient location during evaluation: PACU Anesthesia Type: General Level of consciousness: awake and alert, oriented and patient cooperative Pain management: pain level controlled Vital Signs Assessment: post-procedure vital signs reviewed and stable Respiratory status: spontaneous breathing, nonlabored ventilation and respiratory function stable Cardiovascular status: blood pressure returned to baseline and stable Postop Assessment: adequate PO intake Anesthetic complications: no   No notable events documented.   Last Vitals:  Vitals:   02/10/23 1108 02/10/23 1145  BP: 109/76 113/69  Pulse: 75 76  Resp:  18  Temp: 36.5 C 36.8 C  SpO2: 97% 95%    Last Pain:  Vitals:   02/10/23 1145  TempSrc: Oral  PainSc: 0-No pain                 Reed Breech

## 2023-02-10 NOTE — Op Note (Signed)
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  Introduction catheter into left lower extremity 3rd order catheter placement               2.  Contrast injection left lower extremity for distal runoff with additional 3rd order             3.  Percutaneous transluminal angioplasty left posterior tibial artery to 4 mm                          4.  Percutaneous transluminal angioplasty left tibioperoneal trunk to 4 mm             5.  Star close closure right common femoral arteriotomy  Anesthesia: Conscious sedation was administered under my direct supervision by the interventional radiology RN. IV Versed plus fentanyl were utilized. Continuous ECG, pulse oximetry and blood pressure was monitored throughout the entire procedure.  Conscious sedation was for a total of 45 minutes and 45 seconds.  Sheath: 6 [place] Rabie right common femoral retrograde  Contrast: 60 cc  Fluoroscopy Time: 5.5 minutes  Indications:  Kristopher Oneal presents with increasing pain of the left lower extremity.  His symptoms are associated with tissue loss and osteomyelitis of the left great toe.  Noninvasive studies as well as physical examination demonstrate significant atherosclerotic occlusive disease of the left lower extremity.  This suggests the patient is having limb threatening ischemia. The risks and benefits are reviewed all questions answered patient agrees to proceed.  Procedure: Kristopher Oneal is a 72 y.o. y.o. male who was identified and appropriate procedural time out was performed.  The patient was then placed supine on the table and prepped and draped in the usual sterile fashion.     Ultrasound was placed in the sterile sleeve and the right groin was evaluated the right common femoral artery was echolucent and pulsatile indicating patency.  Image was recorded for the permanent record and under real-time visualization a microneedle was inserted into the common femoral artery followed by the microwire and then the micro-sheath.  A J-wire was then advanced through the micro-sheath and a  5 [place] sheath was then inserted over a J-wire. J-wire was then advanced and a 5 French pigtail catheter was positioned at the level of T12.  AP projection of the aorta was then obtained. Pigtail catheter was repositioned to above the bifurcation and a RAO view of the pelvis was obtained.  Subsequently a pigtail catheter with the stiff angle Glidewire was used to cross the aortic bifurcation the catheter wire were advanced down into the left distal external iliac artery. Oblique view of the femoral bifurcation was then obtained and subsequently the wire was reintroduced and the pigtail catheter negotiated into the SFA representing third order catheter placement. Distal runoff was then performed.  7000 units of heparin was then given and allowed to circulate and a 6 [place] Rabie sheath was advanced up and over the bifurcation and positioned in the mid superficial femoral artery  Vertebral catheter and stiff angle Glidewire were then negotiated down into the mid posterior tibial artery catheter was then advanced. Hand injection contrast demonstrated the intraluminal positioning.  A 4 mm x 100 mm Lutonix drug-eluting balloon was used to angioplasty the tibioperoneal trunk and posterior tibial arteries.  The inflation was for 1 minute at 8 atm. Follow-up imaging demonstrated excellent patency with less than 10% residual stenosis and preservation of the distal runoff.  After review of these images the sheath is pulled into the right external iliac oblique of the common femoral is obtained and a Star close  device deployed. There no immediate Complications.  Findings:  The abdominal aorta is opacified with a bolus injection contrast. Renal arteries are single and widely patent. The aorta itself has diffuse disease but no hemodynamically significant lesions. The common and external iliac arteries are widely patent bilaterally.  The left common femoral is widely patent as is the profunda femoris.  The SFA and popliteal demonstrate mild diffuse disease but there are no hemodynamically significant lesions..  The trifurcation is diseased with greater than 60% stenosis at 2 locations within the tibioperoneal trunk and a greater than 80% stenosis in the origin of the posterior tibial artery.  There is an eccentric calcified lesion in the proximal anterior tibial artery likely 60% diameter reduction.  The peroneal is small and does not contribute significantly to the flow of the foot.    Following angioplasty the posterior tibial now is now widely patent and demonstrates in-line flow with less than 10% residual stenosis.  It is much improved and looks quite nice.   Summary: Successful recanalization left lower extremity for limb salvage                           Disposition: Patient was taken to the recovery room in stable condition having tolerated the procedure well.  Kristopher Oneal 02/10/2023,9:46 AM

## 2023-02-10 NOTE — Progress Notes (Signed)
0751 Pt being transported to vascular at this time

## 2023-02-10 NOTE — Interval H&P Note (Signed)
History and Physical Interval Note:  02/10/2023 11:44 AM  Kristopher Oneal  has presented today for surgery, with the diagnosis of osteomyelitis.  The various methods of treatment have been discussed with the patient and family. After consideration of risks, benefits and other options for treatment, the patient has consented to  Procedure(s): Left Hallux Amputation (Left) as a surgical intervention.  The patient's history has been reviewed, patient examined, no change in status, stable for surgery.  I have reviewed the patient's chart and labs.  Questions were answered to the patient's satisfaction.     Ricci Barker

## 2023-02-10 NOTE — Op Note (Signed)
Date of operation: 02/10/2023.  Surgeon: Ricci Barker D.P.M.  Preoperative diagnosis: Osteomyelitis left hallux.  Postoperative diagnosis: Same.  Procedure: Amputation left hallux.  Anesthesia: Local MAC.  Hemostasis: None.  Estimated blood loss: 15 to 20 cc.  Cultures: #1 deep wound culture left hallux. 2.  Bone culture distal phalanx left hallux.  Pathology: Left hallux.  Complications: None apparent.  Operative indications: This is a 72 year old male with chronic ulceration on his left great toe with chronic underlying osteomyelitis.  Had been stable outpatient on antibiotics but recently flared up and decision was made for hospitalization with the need for amputation of the great toe.  Operative procedure: Patient was taken to the operating room and placed on the table in supine position.  Following satisfactory sedation the left foot was anesthetized with 10 cc of 0.5% Marcaine plain around the first metatarsal.  The foot was then prepped and draped in the usual sterile fashion.  Attention was directed to the left great toe where a fishmouth type incision was made coursing from lateral to medial around the dorsal and plantar aspect of the hallux.  Incision was carried sharply down to the level of the bone where dissection was carried back proximally to the base of the proximal phalanx.  Using a sagittal saw the bone was incised and the distal aspect of the toe was removed in toto.  A sample of bone was taken for bone culture.  The wound was then flushed with copious amounts of sterile saline and closed using 3-0 nylon simple interrupted sutures.  Xeroform 4 x 4's and conformer applied to the left foot followed by an ABD Curlex and Ace wrap.  Patient was awakened and transported to the PACU having tolerated the anesthesia and procedure well with vital signs stable and in good condition.

## 2023-02-10 NOTE — Progress Notes (Signed)
Pharmacy Antibiotic Note  Kristopher Oneal is a 72 y.o. male admitted on 02/05/2023 with  osteomyelitis .  Pharmacy has been consulted for Cefepime and Vancomycin dosing.  Plan: Change vancomycin to 2000 mg IV every 24 hours BMI 33, between Vd 0.5 and 0.72. Scr today 1.16 which is more c/w baseline With Vd 0.72 eAUC: 407.1, Cmin 8.7 With Vd 0.5 eAUC 586.3, Cmin 12.6 Continue cefepime to 2 grams IV every 8 hours Will order a vancomycin trough for tonight @ 1700, prior to the 4th maintenance dose to ensure dose is appropriate  Follow renal function and cultures for adjustments  Height: 6\' 2"  (188 cm) Weight: 117 kg (258 lb) IBW/kg (Calculated) : 82.2  Temp (24hrs), Avg:98.1 F (36.7 C), Min:97.7 F (36.5 C), Max:98.5 F (36.9 C)  Recent Labs  Lab 02/05/23 1104 02/06/23 0523 02/07/23 0450 02/08/23 0433 02/09/23 0403 02/09/23 1751 02/10/23 0450  WBC 13.0* 12.4* 10.0 12.2* 11.1*  --  11.7*  CREATININE 1.49* 1.54* 1.17 1.14 1.08  --  1.16  LATICACIDVEN 1.4  --   --   --   --   --   --   VANCOTROUGH  --   --   --   --   --  12*  --     Estimated Creatinine Clearance: 75.9 mL/min (by C-G formula based on SCr of 1.16 mg/dL).    Allergies  Allergen Reactions   Gabapentin Other (See Comments)    Confusion    Antimicrobials this admission: Cefepime 10/17 >>  Vancomycin 10/17 >>   Dose adjustments this admission: Vancomycin 1250 q24h >> 1750 q24h >> 2000 mg q24h Cefepime 2g q8h>>2g q12h>>2g q8h  Microbiology results: 10/17 Bcx: NGTD  Thank you for allowing pharmacy to be a part of this patient's care.  Merryl Hacker, PharmD Clinical Pharmacist 02/10/2023 7:55 AM

## 2023-02-10 NOTE — Transfer of Care (Signed)
Immediate Anesthesia Transfer of Care Note  Patient: Lukas Degraffenried  Procedure(s) Performed: Left Hallux Amputation (Left: Toe)  Patient Location: PACU  Anesthesia Type:General  Level of Consciousness: drowsy  Airway & Oxygen Therapy: Patient Spontanous Breathing and Patient connected to face mask oxygen  Post-op Assessment: Report given to RN and Post -op Vital signs reviewed and stable  Post vital signs: Reviewed and stable  Last Vitals:  Vitals Value Taken Time  BP 95/56 02/10/23 1322  Temp 35.9 1321  Pulse 67 02/10/23 1324  Resp 15 02/10/23 1324  SpO2 99 % 02/10/23 1324  Vitals shown include unfiled device data.  Last Pain:  Vitals:   02/10/23 1145  TempSrc: Oral  PainSc: 0-No pain         Complications: No notable events documented.

## 2023-02-10 NOTE — Progress Notes (Signed)
PROGRESS NOTE    Kristopher Oneal  RUE:[id number] DOB: [birth year] DOA: 02/05/2023 PCP: Danella Penton, MD    Brief Narrative:   Kristopher Oneal is a 72 y.o. male with PMH significant for DM2, HTN, HLD, permanent A-fib on Eliquis, CHF, arthritis, anxiety 10/17, patient was sent to the ED from [facility] clinic podiatry with complaint of left foot infection concerning for osteomyelitis.   MRI left foot showed osteomyelitis of the distal first phalanx.  Generalized soft tissue swelling in the great toe with apparent ulceration along the plantar aspect of the distal phalanx.  No well-defined abscess.   Admitted to [facility] ABI showed abnormal resting left ankle-brachial index consistent with at least moderate underlying PAD Seen by podiatry and vascular surgery.   Assessment & Plan:   Principal Problem:   Foot osteomyelitis, left (HCC) Active Problems:   Chronic atrial fibrillation (HCC)   HTN (hypertension)   Diabetes mellitus without complication (HCC)   DM type 2 with diabetic mixed hyperlipidemia (HCC)   Hyperlipidemia, mixed   S/P ORIF (open reduction internal fixation) fracture   Leukocytosis   Inflammatory arthritis   Left arm swelling  Acute osteomyelitis of left distal first phalanx  Not septic. Podiatry and vascular surgery following Plan: Status post angiography with successful revascularization on 10/22.  Patient went to the operating room with podiatry post angiography for left hallux amputation.  Tolerated procedure well.  Evaluated postoperatively.  Pain well-controlled.  Continue antiplatelet agents.  Continue anticoagulation.  Continue broad-spectrum IV antibiotics.  Podiatry follow-up on 10/23  PAD  Left ABI abnormal Status post angiography and left hallux amputation.  Eliquis remains on hold.  Currently on Lovenox therapeutic dose and Plavix 75 mg daily   Chronic atrial fibrillation Rate controlled on Cardizem and Toprol. Chronically anticoagulated on Eliquis 5 mg twice  daily.   Currently on therapeutic dose Lovenox Consider transition back to p.o. Eliquis on 10/23   Type 2 diabetes mellitus with hyperglycemia A1c 7.8 on 02/06/2023 PTA meds-70/30 Novolin 50 units daily and 30 units nightly, metformin 1000 mg twice daily, glipizide 5 mg twice daily, Plan: Hold oral agents Continue insulin 70/30 30 units nightly and 50 units every morning SSI Carb modified diet  HTN PTA meds-Cardizem, Toprol, triamterene 37.5 mg daily, HCTZ 25 mg daily, Lasix 20 mg daily as needed Continue Cardizem, Toprol, triamterene-HCTZ. Blood pressure controlled IV hydralazine as needed   HLD Continue lovastatin, fenofibrate   Left arm swelling 10/17, RN noted patient IV infiltrated during Vanc infusion.   Right upper extremity US neg for DVT   Undifferentiated inflammatory arthritis Cont home hydroxychloroquine 200 mg p.o. twice daily Continue outpatient follow-up with rheumatologist   Anxiety Continue Effexor  DVT prophylaxis: Lovenox 1 mg/kg twice daily Code Status: Full Family Communication: Spouse at bedside 10/22 Disposition Plan: Status is: Inpatient Remains inpatient appropriate because: Multiple acute issues as above   Level of care: Telemetry Medical  Consultants:  Vascular surgery Podiatry  Procedures:  Left lower extremity angiography with revascularization Left hallux amputation  Antimicrobials: Vancomycin Cefepime Flagyl  Subjective: Seen and examined postoperatively.  Doing well overall.  Good spirits.  No pain complaints.  Objective: Vitals:   02/10/23 1345 02/10/23 1415 02/10/23 1428 02/10/23 1537  BP: 92/61 (!) 113/94 112/66 105/64  Pulse: 70 75 72 75  Resp: 15 (!) 21 19   Temp:  (!) 97.5 F (36.4 C) (!) 97.2 F (36.2 C) (!) 97.4 F (36.3 C)  TempSrc:    Oral  SpO2: 97% 99% 99%  98%  Weight:      Height:        Intake/Output Summary (Last 24 hours) at 02/10/2023 1541 Last data filed at 02/10/2023 1500 Gross per 24 hour   Intake 2800.97 ml  Output 1280 ml  Net 1520.97 ml   Filed Weights   02/05/23 1540 02/10/23 1145  Weight: 117 kg 117 kg    Examination:  General exam: Appears calm and comfortable  Respiratory system: Clear to auscultation. Respiratory effort normal. Cardiovascular system: S1-2, RRR, no murmurs, no pedal edema Gastrointestinal system: Soft, nontender, nondistended, normal bowel sounds Central nervous system: Alert and oriented. No focal neurological deficits. Extremities: Left foot status post hallux amputation Skin: No rashes, lesions or ulcers Psychiatry: Judgement and insight appear normal. Mood & affect appropriate.     Data Reviewed: I have personally reviewed following labs and imaging studies  CBC: Recent Labs  Lab 02/05/23 1104 02/06/23 0523 02/07/23 0450 02/08/23 0433 02/09/23 0403 02/10/23 0450  WBC 13.0* 12.4* 10.0 12.2* 11.1* 11.7*  NEUTROABS 10.5*  --   --   --   --   --   HGB 15.5 13.4 13.4 14.3 13.8 14.4  HCT 47.4 40.9 39.0 42.2 40.9 42.5  MCV 94.0 94.5 90.9 91.7 90.3 91.2  PLT 322 250 280 352 344 368   Basic Metabolic Panel: Recent Labs  Lab 02/06/23 0523 02/07/23 0450 02/08/23 0433 02/09/23 0403 02/10/23 0450  NA 133* 134* 134* 133* 134*  K 3.7 3.5 3.9 4.0 4.3  CL 101 102 102 102 99  CO2 20* 22 21* 22 26  GLUCOSE 170* 121* 156* 138* 124*  BUN 32* 26* 29* 28* 28*  CREATININE 1.54* 1.17 1.14 1.08 1.16  CALCIUM 8.6* 8.8* 9.2 9.2 9.4  MG  --  2.1 2.1 1.9 2.3   GFR: Estimated Creatinine Clearance: 75.9 mL/min (by C-G formula based on SCr of 1.16 mg/dL). Liver Function Tests: Recent Labs  Lab 02/05/23 1104  AST 43*  ALT 43  ALKPHOS 56  BILITOT 1.2  PROT 8.8*  ALBUMIN 4.4   No results for input(s): "LIPASE", "AMYLASE" in the last 168 hours. No results for input(s): "AMMONIA" in the last 168 hours. Coagulation Profile: Recent Labs  Lab 02/05/23 1148  INR 1.2   Cardiac Enzymes: No results for input(s): "CKTOTAL", "CKMB",  "CKMBINDEX", "TROPONINI" in the last 168 hours. BNP (last 3 results) No results for input(s): "PROBNP" in the last 8760 hours. HbA1C: No results for input(s): "HGBA1C" in the last 72 hours. CBG: Recent Labs  Lab 02/10/23 0804 02/10/23 0943 02/10/23 1116 02/10/23 1142 02/10/23 1322  GLUCAP 114* 106* 104* 108* 121*   Lipid Profile: No results for input(s): "CHOL", "HDL", "LDLCALC", "TRIG", "CHOLHDL", "LDLDIRECT" in the last 72 hours. Thyroid Function Tests: No results for input(s): "TSH", "T4TOTAL", "FREET4", "T3FREE", "THYROIDAB" in the last 72 hours. Anemia Panel: No results for input(s): "VITAMINB12", "FOLATE", "FERRITIN", "TIBC", "IRON", "RETICCTPCT" in the last 72 hours. Sepsis Labs: Recent Labs  Lab 02/05/23 1104  LATICACIDVEN 1.4    Recent Results (from the past 240 hour(s))  Culture, blood (Routine x 2)     Status: None   Collection Time: 02/05/23 11:04 AM   Specimen: BLOOD  Result Value Ref Range Status   Specimen Description BLOOD BLOOD RIGHT ARM  Final   Special Requests   Final    BOTTLES DRAWN AEROBIC AND ANAEROBIC Blood Culture results may not be optimal due to an excessive volume of blood received in culture bottles   Culture  Final    NO GROWTH 5 DAYS Performed at [facility], [address] [place], [place] [address]    Report Status 02/10/2023 FINAL  Final  Culture, blood (Routine x 2)     Status: None   Collection Time: 02/05/23  9:58 PM   Specimen: BLOOD  Result Value Ref Range Status   Specimen Description BLOOD BLOOD RIGHT HAND  Final   Special Requests   Final    BOTTLES DRAWN AEROBIC AND ANAEROBIC Blood Culture results may not be optimal due to an inadequate volume of blood received in culture bottles   Culture   Final    NO GROWTH 5 DAYS Performed at [facility], [address] [place], [place] [address]    Report Status 02/10/2023 FINAL  Final         Radiology Studies: PERIPHERAL VASCULAR  CATHETERIZATION  Result Date: 02/10/2023 See surgical note for result.       Scheduled Meds:  [START ON 02/11/2023] clopidogrel  75 mg Oral Q breakfast   cyanocobalamin  1,000 mcg Oral Daily   diltiazem  300 mg Oral QHS   enoxaparin (LOVENOX) injection  115 mg Subcutaneous Q12H   hydroxychloroquine  200 mg Oral BID   insulin aspart  0-5 Units Subcutaneous QHS   insulin aspart  0-9 Units Subcutaneous TID WC   insulin NPH Human  30 Units Subcutaneous QHS   insulin NPH Human  50 Units Subcutaneous QAC breakfast   metoprolol succinate  100 mg Oral QHS   metoprolol tartrate  2.5 mg Intravenous Once   multivitamin with minerals  1 tablet Oral Daily   pravastatin  40 mg Oral q1800   sodium chloride flush  3 mL Intravenous Q12H   triamterene-hydrochlorothiazide  1 tablet Oral Daily   venlafaxine XR  37.5 mg Oral Q breakfast   Continuous Infusions:  sodium chloride     ceFEPime (MAXIPIME) IV 2 g (02/10/23 0706)   metronidazole 500 mg (02/10/23 0128)   vancomycin 1,000 mg (02/09/23 1919)   And   vancomycin 1,000 mg (02/09/23 1808)     LOS: 5 days     Tresa Moore, MD [facility]   If 7PM-7AM, please contact night-coverage  02/10/2023, 3:41 PM

## 2023-02-10 NOTE — Interval H&P Note (Signed)
History and Physical Interval Note:  02/10/2023 8:20 AM  Kristopher Oneal  has presented today for surgery, with the diagnosis of PAD.  The various methods of treatment have been discussed with the patient and family. After consideration of risks, benefits and other options for treatment, the patient has consented to  Procedure(s): Lower Extremity Angiography (Left) as a surgical intervention.  The patient's history has been reviewed, patient examined, no change in status, stable for surgery.  I have reviewed the patient's chart and labs.  Questions were answered to the patient's satisfaction.     Kristopher Oneal

## 2023-02-10 NOTE — Consult Note (Signed)
PHARMACY - ANTICOAGULATION CONSULT NOTE  Pharmacy Consult for Lovenox Indication: atrial fibrillation  Patient Measurements: Height: 6\' 2"  (188 cm) Weight: 117 kg (258 lb) IBW/kg (Calculated) : 82.2  Labs: Recent Labs    02/08/23 0433 02/09/23 0403 02/10/23 0450  HGB 14.3 13.8 14.4  HCT 42.2 40.9 42.5  PLT 352 344 368  CREATININE 1.14 1.08 1.16    Estimated Creatinine Clearance: 75.9 mL/min (by C-G formula based on SCr of 1.16 mg/dL).   Medical History: Past Medical History:  Diagnosis Date   Anxiety    Arrhythmia    atrial fibrillation   Arthritis    CHF (congestive heart failure) (HCC)    Diabetes mellitus without complication (HCC)    type 2   Dysrhythmia    PVC/ a fib   Heart murmur    High cholesterol    Hypertension     Medications:  Apixaban 5 mg BID prior to admission  Assessment: 72 y/o M with history including Afib on apixaban admitted with acute OM of left distal first phalanx pending vascular procedure which will followed by amputation. Apixaban placed on hold. Pharmacy consulted to dose Lovenox for Afib.  Plan:  --Lovenox 115 mg (~ 1 mg/kg) [place] q12h --CBC and Scr per protocol -- Monitor for appropriate transition back to apixaban post procedure today   Merryl Hacker, PharmD Clinical Pharmacist 02/10/2023,8:16 AM

## 2023-02-11 ENCOUNTER — Encounter: Payer: Self-pay | Admitting: Podiatry

## 2023-02-11 DIAGNOSIS — I739 Peripheral vascular disease, unspecified: Secondary | ICD-10-CM | POA: Insufficient documentation

## 2023-02-11 DIAGNOSIS — S98119A Complete traumatic amputation of unspecified great toe, initial encounter: Secondary | ICD-10-CM | POA: Insufficient documentation

## 2023-02-11 DIAGNOSIS — M86272 Subacute osteomyelitis, left ankle and foot: Secondary | ICD-10-CM | POA: Diagnosis not present

## 2023-02-11 LAB — BASIC METABOLIC PANEL
Anion gap: 9 (ref 5–15)
BUN: 21 mg/dL (ref 8–23)
CO2: 23 mmol/L (ref 22–32)
Calcium: 9 mg/dL (ref 8.9–10.3)
Chloride: 102 mmol/L (ref 98–111)
Creatinine, Ser: 1.02 mg/dL (ref 0.61–1.24)
GFR, Estimated: >60 mL/min (ref >60)
Glucose, Bld: 185 mg/dL — ABNORMAL HIGH (ref 70–99)
Potassium: 4.3 mmol/L (ref 3.5–5.1)
Sodium: 134 mmol/L — ABNORMAL LOW (ref 135–145)

## 2023-02-11 LAB — CBC
HCT: 40.9 % (ref 39.0–52.0)
Hemoglobin: 13.7 g/dL (ref 13.0–17.0)
MCH: 31.1 pg (ref 26.0–34.0)
MCHC: 33.5 g/dL (ref 30.0–36.0)
MCV: 93 fL (ref 80.0–100.0)
Platelets: 379 10*3/uL (ref 150–400)
RBC: 4.4 MIL/uL (ref 4.22–5.81)
RDW: 13.2 % (ref 11.5–15.5)
WBC: 12 10*3/uL — ABNORMAL HIGH (ref 4.0–10.5)
nRBC: 0 % (ref 0.0–0.2)

## 2023-02-11 LAB — GLUCOSE, CAPILLARY
Glucose-Capillary: 157 mg/dL — ABNORMAL HIGH (ref 70–99)
Glucose-Capillary: 184 mg/dL — ABNORMAL HIGH (ref 70–99)

## 2023-02-11 LAB — MAGNESIUM: Magnesium: 2.1 mg/dL (ref 1.7–2.4)

## 2023-02-11 MED ORDER — CLOPIDOGREL BISULFATE 75 MG PO TABS: 75.0000 mg | ORAL_TABLET | Freq: Every day | ORAL | 1 refills | Status: DC

## 2023-02-11 MED ORDER — DOXYCYCLINE HYCLATE 100 MG PO CAPS: 100.0000 mg | ORAL_CAPSULE | Freq: Two times a day (BID) | ORAL | 0 refills | Status: DC

## 2023-02-11 MED ORDER — AMOXICILLIN-POT CLAVULANATE 875-125 MG PO TABS: 1.0000 | ORAL_TABLET | Freq: Two times a day (BID) | ORAL | 0 refills | Status: AC

## 2023-02-11 NOTE — Discharge Summary (Signed)
Kristopher Oneal YQM:[id number] DOB: 03-Jan-1951 DOA: 02/05/2023  PCP: Danella Penton, MD  Admit date: 02/05/2023 Discharge date: 02/11/2023  Time spent: 35 minutes  Recommendations for Outpatient Follow-up:  Pcp, vascular surgery, and podiatry f/u (podiatry this Monday)     Discharge Diagnoses:  Principal Problem:   Foot osteomyelitis, left (HCC) Active Problems:   Chronic atrial fibrillation (HCC)   HTN (hypertension)   Diabetes mellitus without complication (HCC)   DM type 2 with diabetic mixed hyperlipidemia (HCC)   Hyperlipidemia, mixed   S/P ORIF (open reduction internal fixation) fracture   Leukocytosis   Inflammatory arthritis   Left arm swelling   Discharge Condition: stable  Diet recommendation: heart healthy carb modified  Filed Weights   02/05/23 1540 02/10/23 1145  Weight: 117 kg 117 kg    History of present illness:  From admission h and p 72 year old male with history of insulin-dependent diabetes mellitus, hypertension, hyperlipidemia, permanent atrial fibrillation on Eliquis, who presents to the emergency department for chief concerns of left foot infection concerning for osteomyelitis.   Patient was sent from outpatient [facility] clinic podiatry.   At bedside, patient was able to tell me his name, age, location, current calendar year.  His spouse was at bedside.   He developed a hematoma about two years ago. In December 2023, patient reports his podiatrist did bedside debridement and chemical debridement (cream). The wound has not healed since.    Patient and spouse noticed that the swelling and incrased warmth started on 10/12.    He denies fever, chills, nausea, vomtiing, chest pain, shortness of breath, syncope, dysuria, hematuria, blood in his stool, diarrhea.   Hospital Course:  Patient presents with osteomyelitis of left first toe. ABIs showed PAD in that leg and patient underwent angiogram with angioplasty with vascular surgery on 10/22. Later that  day underwent amputation of the left first toe with podiatry. He is cleared for discharge by podiatry and PT has evaluated and identified no home health needs. We will write for 7 days augmentin/doxycycline and he will follow up with podiatry on Monday. Plavix was added to his home regimen by vascular surgery, we will advise he f/u with them and with his pcp. Other chronic problems including a-fib, t2dm, htn are stable and home meds were continued.   Procedures: Angiogram with angioplasty, left first toe amputation  Consultations: Vascular, podiatry  Discharge Exam: Vitals:   02/11/23 0323 02/11/23 0613  BP: 104/72 111/60  Pulse: 71 70  Resp: 17 16  Temp: 98.1 F (36.7 C) 98 F (36.7 C)  SpO2: 97% 97%    General: NAD Cardiovascular: RRR Respiratory: CTAB Ext: warm, left foot is bandaged, no edema  Discharge Instructions   Discharge Instructions     Diet - low sodium heart healthy   Complete by: As directed    Increase activity slowly   Complete by: As directed    Leave dressing on - Keep it clean, dry, and intact until clinic visit   Complete by: As directed       Allergies as of 02/11/2023       Reactions   Gabapentin Other (See Comments)   Confusion        Medication List     TAKE these medications    Advanced Diabetic Multivitamin Tabs Take 1 tablet by mouth daily.   amoxicillin-clavulanate 875-125 MG tablet Commonly known as: AUGMENTIN Take 1 tablet by mouth 2 (two) times daily for 7 days.   clopidogrel 75 MG tablet Commonly  known as: PLAVIX Take 1 tablet (75 mg total) by mouth daily with breakfast. Start taking on: February 12, 2023   cyanocobalamin 500 MCG tablet Commonly known as: VITAMIN B12 Take 1,000 mcg by mouth daily.   diltiazem 300 MG 24 hr capsule Commonly known as: CARDIZEM CD Take 1 capsule (300 mg total) by mouth at bedtime.   doxycycline 100 MG capsule Commonly known as: VIBRAMYCIN Take 1 capsule (100 mg total) by mouth 2  (two) times daily.   Eliquis 5 MG Tabs tablet Generic drug: apixaban Take 1 tablet (5 mg total) by mouth 2 (two) times daily.   fenofibrate 160 MG tablet Take 160 mg by mouth daily.   fluticasone 50 MCG/ACT nasal spray Commonly known as: FLONASE Place 1 spray into the nose daily as needed for allergies.   FreeStyle Libre 14 Day Sensor Misc SMARTSIG:1 Kit(s) Topical Every 2 Weeks   furosemide 20 MG tablet Commonly known as: LASIX Take 1 tablet (20 mg total) by mouth daily. What changed:  when to take this reasons to take this   glipiZIDE 5 MG tablet Commonly known as: GLUCOTROL Take 5 mg by mouth 2 (two) times daily.   HumuLIN 70/30 (70-30) 100 UNIT/ML injection Generic drug: insulin NPH-regular Human Inject 25-50 Units into the skin See admin instructions. Inject 50 units into the skin in the morning and 30 units in the evening   hydroxychloroquine 200 MG tablet Commonly known as: PLAQUENIL Take 200 mg by mouth 2 (two) times daily.   lovastatin 40 MG tablet Commonly known as: MEVACOR Take 1 tablet (40 mg total) by mouth daily with supper.   metFORMIN 1000 MG tablet Commonly known as: GLUCOPHAGE Take 1,000 mg by mouth 2 (two) times daily with a meal.   metoprolol succinate 100 MG 24 hr tablet Commonly known as: TOPROL-XL Take 1 tablet (100 mg total) by mouth at bedtime.   mupirocin ointment 2 % Commonly known as: BACTROBAN Apply 1 Application topically daily.   sildenafil 100 MG tablet Commonly known as: VIAGRA Take 100 mg by mouth daily as needed.   triamterene-hydrochlorothiazide 37.5-25 MG capsule Commonly known as: DYAZIDE Take 1 capsule by mouth daily.   venlafaxine XR 75 MG 24 hr capsule Commonly known as: EFFEXOR-XR Take 75 mg by mouth daily with breakfast.               Discharge Care Instructions  (From admission, onward)           Start     Ordered   02/11/23 0000  Leave dressing on - Keep it clean, dry, and intact until clinic  visit        02/11/23 1237           Allergies  Allergen Reactions   Gabapentin Other (See Comments)    Confusion    Follow-up Information     Danella Penton, MD Follow up.   Specialty: Internal Medicine Contact information: [phone] [facility] [address] [facility] [place] [address] [phone]         Linus Galas, DPM Follow up.   Specialty: Podiatry Why: call to schedule an appointment for monday 10/28 Contact information: [address] [place] [address] [phone]         Georgiana Spinner, NP Follow up.   Specialty: Vascular Surgery Contact information: [address] [place] [address] [phone]  The results of significant diagnostics from this hospitalization (including imaging, microbiology, ancillary and laboratory) are listed below for reference.    Significant Diagnostic Studies: PERIPHERAL VASCULAR CATHETERIZATION  Result Date: 02/10/2023 See surgical note for result.  US ARTERIAL ABI (SCREENING LOWER EXTREMITY)  Result Date: 02/06/2023 CLINICAL DATA:  Peripheral artery disease EXAM: NONINVASIVE PHYSIOLOGIC VASCULAR STUDY OF BILATERAL LOWER EXTREMITIES TECHNIQUE: Evaluation of both lower extremities were performed at rest, including calculation of ankle-brachial indices with single level Doppler, pressure and pulse volume recording. COMPARISON:  None Available. FINDINGS: Right ABI:  1.1 Left ABI:  0.75 Right Lower Extremity:  Normal arterial waveforms at the ankle. Left Lower Extremity:  Diminished biphasic arterial waveforms. 0.5-0.79 Moderate PAD IMPRESSION: 1. Abnormal resting left ankle-brachial index and arterial waveforms consistent with at least moderate underlying peripheral arterial disease. 2. The resting right ankle-brachial index and arterial waveforms are normal. Electronically Signed   By: Malachy Moan M.D.   On: 02/06/2023 06:59   US Venous Img Upper  Uni Left (DVT)  Result Date: 02/06/2023 CLINICAL DATA:  Left arm swelling EXAM: LEFT UPPER EXTREMITY VENOUS DOPPLER ULTRASOUND TECHNIQUE: Gray-scale sonography with graded compression, as well as color Doppler and duplex ultrasound were performed to evaluate the upper extremity deep venous system from the level of the subclavian vein and including the jugular, axillary, basilic, radial, ulnar and upper cephalic vein. Spectral Doppler was utilized to evaluate flow at rest and with distal augmentation maneuvers. COMPARISON:  None Available. FINDINGS: Contralateral Subclavian Vein: Respiratory phasicity is normal and symmetric with the symptomatic side. No evidence of thrombus. Normal compressibility. Internal Jugular Vein: No evidence of thrombus. Normal compressibility, respiratory phasicity and response to augmentation. Subclavian Vein: No evidence of thrombus. Normal compressibility, respiratory phasicity and response to augmentation. Axillary Vein: No evidence of thrombus. Normal compressibility, respiratory phasicity and response to augmentation. Cephalic Vein: No evidence of thrombus. Normal compressibility, respiratory phasicity and response to augmentation. Basilic Vein: No evidence of thrombus. Normal compressibility, respiratory phasicity and response to augmentation. Brachial Veins: No evidence of thrombus. Normal compressibility, respiratory phasicity and response to augmentation. Radial Veins: No evidence of thrombus. Normal compressibility, respiratory phasicity and response to augmentation. Ulnar Veins: No evidence of thrombus. Normal compressibility, respiratory phasicity and response to augmentation. Venous Reflux:  None visualized. Other Findings: Edema seen within the subcutaneous soft tissues of the left forearm. IMPRESSION: No evidence of DVT within the left upper extremity. Electronically Signed   By: Charlett Nose M.D.   On: 02/06/2023 00:23   MR FOOT LEFT WO CONTRAST  Result Date:  02/05/2023 CLINICAL DATA:  Diabetic ulcer on left great toe. Serosanguineous drainage. No recent fever. Soft tissue infection suspected. EXAM: MRI OF THE LEFT FOOT WITHOUT CONTRAST TECHNIQUE: Multiplanar, multisequence MR imaging of the left forefoot was performed. No intravenous contrast was administered. COMPARISON:  None Available. FINDINGS: Bones/Joint/Cartilage There is cortical destruction of the head of the distal 1st phalanx, best seen on the coronal images. Associated decreased T1 and increased T2 marrow signal. No significant interphalangeal joint effusion. The proximal phalanx appears intact. The additional toes appear intact. There are moderate degenerative changes at the 1st metatarsophalangeal joint. The visualized metatarsals appear intact. No significant arthropathy at the Lisfranc joint. Ligaments Intact Lisfranc ligament. The collateral ligaments of the metatarsophalangeal joints appear intact. Muscles and Tendons Generalized forefoot muscular atrophy with mild nonspecific muscular edema. The forefoot tendons appear intact, without significant tenosynovitis. Soft tissues Generalized soft tissue swelling in the great toe with apparent ulceration along the plantar  aspect of the distal phalanx. There is a small amount of heterogeneous fluid surrounding the distal phalanx, but no well-defined abscess. No other focal soft tissue abnormalities are identified. IMPRESSION: 1. Findings are consistent with osteomyelitis of the distal 1st phalanx. 2. Generalized soft tissue swelling in the great toe with apparent ulceration along the plantar aspect of the distal phalanx. No well-defined abscess. 3. Moderate degenerative changes at the 1st metatarsophalangeal joint. 4. Generalized forefoot muscular atrophy. Electronically Signed   By: Carey Bullocks M.D.   On: 02/05/2023 15:45   DG Chest 2 View  Result Date: 02/05/2023 CLINICAL DATA:  Diabetic ulcer.  Concern for sepsis. EXAM: CHEST - 2 VIEW COMPARISON:   Chest radiograph dated December 02, 2021. FINDINGS: Stable cardiomegaly. The mediastinal contours are within normal limits. Aortic calcification. Bilateral interstitial prominence, most pronounced at the lung bases. No pneumothorax or pleural effusion. No acute osseous abnormality. IMPRESSION: Cardiomegaly with findings suggestive of pulmonary vascular congestion. Electronically Signed   By: Hart Robinsons M.D.   On: 02/05/2023 13:29    Microbiology: Recent Results (from the past 240 hour(s))  Culture, blood (Routine x 2)     Status: None   Collection Time: 02/05/23 11:04 AM   Specimen: BLOOD  Result Value Ref Range Status   Specimen Description BLOOD BLOOD RIGHT ARM  Final   Special Requests   Final    BOTTLES DRAWN AEROBIC AND ANAEROBIC Blood Culture results may not be optimal due to an excessive volume of blood received in culture bottles   Culture   Final    NO GROWTH 5 DAYS Performed at [facility], [address] [place], [place] [address]    Report Status 02/10/2023 FINAL  Final  Culture, blood (Routine x 2)     Status: None   Collection Time: 02/05/23  9:58 PM   Specimen: BLOOD  Result Value Ref Range Status   Specimen Description BLOOD BLOOD RIGHT HAND  Final   Special Requests   Final    BOTTLES DRAWN AEROBIC AND ANAEROBIC Blood Culture results may not be optimal due to an inadequate volume of blood received in culture bottles   Culture   Final    NO GROWTH 5 DAYS Performed at [facility], [address] [place], [place] [address]    Report Status 02/10/2023 FINAL  Final  Aerobic/Anaerobic Culture w Gram Stain (surgical/deep wound)     Status: None (Preliminary result)   Collection Time: 02/10/23 12:51 PM   Specimen: Wound; Body Fluid  Result Value Ref Range Status   Specimen Description TISSUE LEFT TOE  Final   Special Requests SAMPLE B LEFT GREAT TOE  Final   Gram Stain NO WBC SEEN NO ORGANISMS SEEN   Final   Culture   Final    NO GROWTH < 12  HOURS Performed at [facility], [address], [place], [place] [address]    Report Status PENDING  Incomplete  Aerobic/Anaerobic Culture w Gram Stain (surgical/deep wound)     Status: None (Preliminary result)   Collection Time: 02/10/23 12:53 PM   Specimen: Wound; Tissue  Result Value Ref Range Status   Specimen Description WOUND LEFT TOE  Final   Special Requests LEFT GREAT TOE  Final   Gram Stain   Final    RARE WBC PRESENT, PREDOMINANTLY PMN RARE GRAM POSITIVE COCCI    Culture   Final    NO GROWTH < 12 HOURS Performed at [facility], [address] [place], [place] [address]    Report Status PENDING  Incomplete     Labs: Basic Metabolic Panel: Recent Labs  Lab 02/07/23 0450 02/08/23 0433 02/09/23 0403 02/10/23 0450 02/11/23 0430  NA 134* 134* 133* 134* 134*  K 3.5 3.9 4.0 4.3 4.3  CL 102 102 102 99 102  CO2 22 21* 22 26 23   GLUCOSE 121* 156* 138* 124* 185*  BUN 26* 29* 28* 28* 21  CREATININE 1.17 1.14 1.08 1.16 1.02  CALCIUM 8.8* 9.2 9.2 9.4 9.0  MG 2.1 2.1 1.9 2.3 2.1   Liver Function Tests: Recent Labs  Lab 02/05/23 1104  AST 43*  ALT 43  ALKPHOS 56  BILITOT 1.2  PROT 8.8*  ALBUMIN 4.4   No results for input(s): "LIPASE", "AMYLASE" in the last 168 hours. No results for input(s): "AMMONIA" in the last 168 hours. CBC: Recent Labs  Lab 02/05/23 1104 02/06/23 0523 02/07/23 0450 02/08/23 0433 02/09/23 0403 02/10/23 0450 02/11/23 0430  WBC 13.0*   < > 10.0 12.2* 11.1* 11.7* 12.0*  NEUTROABS 10.5*  --   --   --   --   --   --   HGB 15.5   < > 13.4 14.3 13.8 14.4 13.7  HCT 47.4   < > 39.0 42.2 40.9 42.5 40.9  MCV 94.0   < > 90.9 91.7 90.3 91.2 93.0  PLT 322   < > 280 352 344 368 379   < > = values in this interval not displayed.   Cardiac Enzymes: No results for input(s): "CKTOTAL", "CKMB", "CKMBINDEX", "TROPONINI" in the last 168 hours. BNP: BNP (last 3 results) No results for input(s): "BNP" in the last 8760 hours.  ProBNP  (last 3 results) No results for input(s): "PROBNP" in the last 8760 hours.  CBG: Recent Labs  Lab 02/10/23 1322 02/10/23 1652 02/10/23 2117 02/11/23 0803 02/11/23 1203  GLUCAP 121* 186* 236* 157* 184*       Signed:  Silvano Bilis MD.  [facility] 02/11/2023, 12:43 PM

## 2023-02-11 NOTE — Progress Notes (Signed)
Pharmacy Antibiotic Note  Kristopher Oneal is a 72 y.o. male admitted on 02/05/2023 with  osteomyelitis .  Pharmacy has been consulted for Cefepime and Vancomycin dosing.   Assessment: Current regimen: Vancomycin 2000 mg IV Q24H Preceding dose 10/21 @ 1919  Vanc tr 10/22 @ 1741: 13 ug/mL PK Calculations AUC: 513 Cmin: 11.4 T 1/2: 13.4 hr  Plan: Continue vancomycin 2000 mg IV q24h Continue cefepime 2 g IV q8h Vancomycin levels as clinically indicated Follow renal function and cultures for adjustments  Height: 6\' 2"  (188 cm) Weight: 117 kg (258 lb) IBW/kg (Calculated) : 82.2  Temp (24hrs), Avg:97.8 F (36.6 C), Min:97.2 F (36.2 C), Max:98.3 F (36.8 C)  Recent Labs  Lab 02/05/23 1104 02/06/23 0523 02/07/23 0450 02/08/23 0433 02/09/23 0403 02/09/23 1751 02/10/23 0450 02/10/23 1648 02/11/23 0430  WBC 13.0*   < > 10.0 12.2* 11.1*  --  11.7*  --  12.0*  CREATININE 1.49*   < > 1.17 1.14 1.08  --  1.16  --  1.02  LATICACIDVEN 1.4  --   --   --   --   --   --   --   --   VANCOTROUGH  --   --   --   --   --  12*  --  13*  --    < > = values in this interval not displayed.    Estimated Creatinine Clearance: 86.4 mL/min (by C-G formula based on SCr of 1.02 mg/dL).    Allergies  Allergen Reactions   Gabapentin Other (See Comments)    Confusion    Antimicrobials this admission: Cefepime 10/17 >>  Vancomycin 10/17 >>   Dose adjustments this admission: Vancomycin 1250 q24h >> 1750 q24h >> 2000 mg q24h Cefepime 2g q8h>>2g q12h>>2g q8h  Microbiology results: 10/17 Bcx: NGTD 10/22 tissue from wound: rare gram positive cocci  Thank you for allowing pharmacy to be a part of this patient's care.  Merryl Hacker, PharmD Clinical Pharmacist 02/11/2023 8:11 AM

## 2023-02-11 NOTE — Care Management Important Message (Signed)
Important Message  Patient Details  Name: Kristopher Oneal MRN: [id number] Date of Birth: [birth year]   Important Message Given:  Yes - Medicare IM     Hetty Ely, RN 02/11/2023, 2:45 PM

## 2023-02-11 NOTE — Progress Notes (Signed)
1 Day Post-Op   Subjective/Chief Complaint: Patient seen.  No complaints or pain with his amputation site.   Objective: Vital signs in last 24 hours: Temp:  [97.2 F (36.2 C)-98.2 F (36.8 C)] 98 F (36.7 C) (10/23 2841) Pulse Rate:  [70-86] 70 (10/23 0613) Resp:  [15-21] 16 (10/23 3244) BP: (92-116)/(52-94) 111/60 (10/23 0613) SpO2:  [97 %-99 %] 97 % (10/23 0613) Last BM Date : 02/09/23  Intake/Output from previous day: 10/22 0701 - 10/23 0700 In: 1275 [P.O.:225; I.V.:1000; IV Piggyback:50] Out: 1905 [Urine:1900; Blood:5] Intake/Output this shift: Total I/O In: 480 [P.O.:480] Out: -   Bandage on the left foot is dry and intact.  Upon removal there is some mild dried blood on the bandaging.  Incision is well coapted.  Some continued erythema but the wound edges appear viable at this point.  Mild active bleeding noted.  Lab Results:  Recent Labs    02/10/23 0450 02/11/23 0430  WBC 11.7* 12.0*  HGB 14.4 13.7  HCT 42.5 40.9  PLT 368 379   BMET Recent Labs    02/10/23 0450 02/11/23 0430  NA 134* 134*  K 4.3 4.3  CL 99 102  CO2 26 23  GLUCOSE 124* 185*  BUN 28* 21  CREATININE 1.16 1.02  CALCIUM 9.4 9.0   PT/INR No results for input(s): "LABPROT", "INR" in the last 72 hours. ABG No results for input(s): "PHART", "HCO3" in the last 72 hours.  Invalid input(s): "PCO2", "PO2"  Studies/Results: PERIPHERAL VASCULAR CATHETERIZATION  Result Date: 02/10/2023 See surgical note for result.   Anti-infectives: Anti-infectives (From admission, onward)    Start     Dose/Rate Route Frequency Ordered Stop   02/11/23 0000  amoxicillin-clavulanate (AUGMENTIN) 875-125 MG tablet        1 tablet Oral 2 times daily 02/11/23 1237 02/18/23 2359   02/11/23 0000  doxycycline (VIBRAMYCIN) 100 MG capsule        100 mg Oral 2 times daily 02/11/23 1237     02/09/23 2220  ceFAZolin (ANCEF) IVPB 2g/100 mL premix  Status:  Discontinued        2 g 200 mL/hr over 30 Minutes  Intravenous 30 min pre-op 02/09/23 2220 02/10/23 1002   02/09/23 1800  vancomycin (VANCOCIN) IVPB 1000 mg/200 mL premix       Placed in "And" Linked Group   1,000 mg 200 mL/hr over 60 Minutes Intravenous Every 24 hours 02/09/23 1601     02/09/23 1800  vancomycin (VANCOCIN) IVPB 1000 mg/200 mL premix       Placed in "And" Linked Group   1,000 mg 200 mL/hr over 60 Minutes Intravenous Every 24 hours 02/09/23 1601     02/07/23 1800  vancomycin (VANCOREADY) IVPB 2000 mg/400 mL  Status:  Discontinued        2,000 mg 200 mL/hr over 120 Minutes Intravenous Every 24 hours 02/07/23 0707 02/09/23 1601   02/07/23 1400  ceFEPIme (MAXIPIME) 2 g in sodium chloride 0.9 % 100 mL IVPB        2 g 200 mL/hr over 30 Minutes Intravenous Every 8 hours 02/07/23 0700     02/06/23 1800  vancomycin (VANCOREADY) IVPB 1250 mg/250 mL  Status:  Discontinued        1,250 mg 166.7 mL/hr over 90 Minutes Intravenous Every 24 hours 02/05/23 1547 02/06/23 0958   02/06/23 1800  ceFEPIme (MAXIPIME) 2 g in sodium chloride 0.9 % 100 mL IVPB  Status:  Discontinued  2 g 200 mL/hr over 30 Minutes Intravenous Every 12 hours 02/06/23 0805 02/07/23 0700   02/06/23 1800  vancomycin (VANCOREADY) IVPB 1750 mg/350 mL  Status:  Discontinued        1,750 mg 175 mL/hr over 120 Minutes Intravenous Every 24 hours 02/06/23 0958 02/07/23 0707   02/06/23 1230  metroNIDAZOLE (FLAGYL) IVPB 500 mg        500 mg 100 mL/hr over 60 Minutes Intravenous Every 12 hours 02/06/23 1140     02/05/23 2200  ceFEPIme (MAXIPIME) 2 g in sodium chloride 0.9 % 100 mL IVPB  Status:  Discontinued        2 g 200 mL/hr over 30 Minutes Intravenous Every 8 hours 02/05/23 1547 02/06/23 0805   02/05/23 2200  hydroxychloroquine (PLAQUENIL) tablet 200 mg        200 mg Oral 2 times daily 02/05/23 1707     02/05/23 1645  vancomycin (VANCOREADY) IVPB 2000 mg/400 mL        2,000 mg 200 mL/hr over 120 Minutes Intravenous  Once 02/05/23 1547 02/05/23 1809   02/05/23  1200  ceFEPIme (MAXIPIME) 2 g in sodium chloride 0.9 % 100 mL IVPB        2 g 200 mL/hr over 30 Minutes Intravenous  Once 02/05/23 1153 02/05/23 1317   02/05/23 1200  metroNIDAZOLE (FLAGYL) IVPB 500 mg        500 mg 100 mL/hr over 60 Minutes Intravenous  Once 02/05/23 1153 02/05/23 1459   02/05/23 1200  vancomycin (VANCOCIN) IVPB 1000 mg/200 mL premix  Status:  Discontinued        1,000 mg 200 mL/hr over 60 Minutes Intravenous  Once 02/05/23 1153 02/05/23 1547       Assessment/Plan: s/p Procedure(s): Left Hallux Amputation (Left) Assessment: Stable status post amputation left hallux.  Plan: Betadine gauze and sterile dressing reapplied to the left foot.  Patient was instructed he can be weightbearing in his surgical shoe with pressure only on the heel.  Keep the bandage clean, dry, and do not remove.  Stable for discharge from podiatry standpoint.  Would recommend antibiotic coverage with Augmentin and doxycycline and this can be modified as culture results return.  Discussed with the patient I will plan for follow-up on Monday or Tuesday next week.  LOS: 6 days    Ricci Barker 02/11/2023

## 2023-02-11 NOTE — Plan of Care (Signed)

## 2023-02-11 NOTE — Evaluation (Signed)
Occupational Therapy Evaluation Patient Details Name: Kristopher Oneal MRN: [id number] DOB: [birth year] Today's Date: 02/11/2023   History of Present Illness 72 year old male with history of insulin-dependent diabetes mellitus, hypertension, hyperlipidemia, permanent atrial fibrillation on Eliquis, who presents to the emergency department for chief concerns of left foot infection concerning for osteomyelitis.   Clinical Impression   Mr Amundsen was seen for OT evaluation this date. Prior to hospital admission, pt was MOD I using SPC as needed. Pt lives in RV camper. Pt currently requires SUPERVISION + intermittent single UE support for ADL t/f transporting items and placing items in knee high cabinets. MIN A don post op shoe in sitting. Educated on DME recs. All education complete, will sign off. Upon hospital discharge, recommend no OT follow up.    If plan is discharge home, recommend the following: Help with stairs or ramp for entrance    Functional Status Assessment  Patient has had a recent decline in their functional status and demonstrates the ability to make significant improvements in function in a reasonable and predictable amount of time.  Equipment Recommendations  None recommended by OT    Recommendations for Other Services       Precautions / Restrictions Precautions Precautions: Fall Restrictions Weight Bearing Restrictions: No      Mobility Bed Mobility Overal bed mobility: Independent                  Transfers Overall transfer level: Modified independent Equipment used: None               General transfer comment: rails      Balance Overall balance assessment: Modified Independent                                         ADL either performed or assessed with clinical judgement   ADL Overall ADL's : Needs assistance/impaired                                       General ADL Comments: SUPERVISION + intermittent  single UE support for ADL t/f transporting items and placing items in knee high cabinets. MIN A don post op shoe in sitting      Pertinent Vitals/Pain Pain Assessment Pain Assessment: 0-10 Pain Score: 2  Pain Location: L great toe Pain Descriptors / Indicators: Aching, Discomfort     Extremity/Trunk Assessment             Communication Communication Communication: No apparent difficulties   Cognition Arousal: Alert Behavior During Therapy: WFL for tasks assessed/performed Overall Cognitive Status: Within Functional Limits for tasks assessed                                           Home Living Family/patient expects to be discharged to:: Private residence Living Arrangements: Spouse/significant other Available Help at Discharge: Family;Available PRN/intermittently Type of Home:  (5th wheel camper) Home Access: Stairs to enter Entrance Stairs-Number of Steps: 4+4 Entrance Stairs-Rails: Can reach both Home Layout:  (2-3 steps to access bed and bathroom area of tight quarters RV)     Bathroom Shower/Tub: Walk-in shower         Home Equipment: Agricultural consultant (2 wheels);BSC/3in1;Shower  seat;Grab bars - tub/shower          Prior Functioning/Environment Prior Level of Function : Independent/Modified Independent             Mobility Comments: Reports not needing AD, driving and independent          OT Problem List: Decreased strength         OT Goals(Current goals can be found in the care plan section) Acute Rehab OT Goals Patient Stated Goal: to go home OT Goal Formulation: With patient Time For Goal Achievement: 02/11/23 Potential to Achieve Goals: Good   AM-PAC OT "6 Clicks" Daily Activity     Outcome Measure Help from another person eating meals?: None Help from another person taking care of personal grooming?: None Help from another person toileting, which includes using toliet, bedpan, or urinal?: None Help from another  person bathing (including washing, rinsing, drying)?: A Little Help from another person to put on and taking off regular upper body clothing?: None Help from another person to put on and taking off regular lower body clothing?: A Little 6 Click Score: 22   End of Session    Activity Tolerance: Patient tolerated treatment well Patient left: in bed;with call bell/phone within reach  OT Visit Diagnosis: Other abnormalities of gait and mobility (R26.89)                Time: 1610-9604 OT Time Calculation (min): 12 min Charges:  OT General Charges $OT Visit: 1 Visit OT Evaluation $OT Eval Low Complexity: 1 Low  Kathie Dike, M.S. OTR/L  02/11/23, 12:35 PM  ascom [phone]

## 2023-02-12 LAB — SURGICAL PATHOLOGY

## 2023-02-14 LAB — AEROBIC/ANAEROBIC CULTURE W GRAM STAIN (SURGICAL/DEEP WOUND): Gram Stain: NONE SEEN

## 2023-02-15 LAB — AEROBIC/ANAEROBIC CULTURE W GRAM STAIN (SURGICAL/DEEP WOUND)

## 2023-03-12 ENCOUNTER — Ambulatory Visit (INDEPENDENT_AMBULATORY_CARE_PROVIDER_SITE_OTHER): Payer: Medicare Other | Admitting: Nurse Practitioner

## 2023-04-11 ENCOUNTER — Other Ambulatory Visit: Payer: Self-pay | Admitting: Obstetrics and Gynecology

## 2023-04-20 ENCOUNTER — Encounter (INDEPENDENT_AMBULATORY_CARE_PROVIDER_SITE_OTHER): Payer: Self-pay | Admitting: Nurse Practitioner

## 2023-04-20 ENCOUNTER — Other Ambulatory Visit (INDEPENDENT_AMBULATORY_CARE_PROVIDER_SITE_OTHER): Payer: Self-pay | Admitting: Nurse Practitioner

## 2023-04-20 ENCOUNTER — Ambulatory Visit (INDEPENDENT_AMBULATORY_CARE_PROVIDER_SITE_OTHER): Payer: Medicare Other | Admitting: Nurse Practitioner

## 2023-04-20 ENCOUNTER — Ambulatory Visit (INDEPENDENT_AMBULATORY_CARE_PROVIDER_SITE_OTHER): Payer: Medicare Other

## 2023-04-20 VITALS — BP 105/67 | HR 55 | Resp 18 | Ht 75.0 in | Wt 265.8 lb

## 2023-04-20 DIAGNOSIS — I10 Essential (primary) hypertension: Secondary | ICD-10-CM | POA: Diagnosis not present

## 2023-04-20 DIAGNOSIS — I739 Peripheral vascular disease, unspecified: Secondary | ICD-10-CM

## 2023-04-20 DIAGNOSIS — E1169 Type 2 diabetes mellitus with other specified complication: Secondary | ICD-10-CM | POA: Diagnosis not present

## 2023-04-20 DIAGNOSIS — Z9889 Other specified postprocedural states: Secondary | ICD-10-CM | POA: Diagnosis not present

## 2023-04-20 DIAGNOSIS — E782 Mixed hyperlipidemia: Secondary | ICD-10-CM | POA: Diagnosis not present

## 2023-04-21 LAB — VAS US ABI WITH/WO TBI
Left ABI: 1.15
Right ABI: 1.29

## 2023-05-04 NOTE — Progress Notes (Signed)
 HPI: Kristopher Oneal is a 73 y.o. male who presents for follow up diabetes.  He was diagnosed with diabetes over 10 years ago. I last saw him in 9/24.  At that time I switched him to the libre3.  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  He also had a LLE angiography and angioplasty in 10/24.  He is currently on glipizide  5 mg bid and MTF 1000 mg bid.  He is also taking Humulin  70/30 (pen) 55 units in the morning and 20 units at night. He uses the libre2 for CGM.  He scans once a day.  His 14 day average was 169.  His TIR is 56%.  Review of his diet shows that he avoids concentrated carbs.  He is currently doing intermittent fasting (between 11am-7pm).   His exercise is limited due to his LTKR and previous patella frx.  He numbness in his feet and ankles.  He is concerned about his diabetes.     ROS:  No chest pain.  No shortness of breath.   Medical History: Past Medical History:  Diagnosis Date  . A-fib (CMS/HHS-HCC)    Per patient  . Benign essential hypertension 07/17/2016  . Chronic atrial fibrillation (CMS/HHS-HCC)   . Chronic painful diabetic neuropathy (CMS/HHS-HCC) 07/17/2016  . Controlled type 2 diabetes mellitus with complication, with long-term current use of insulin  (CMS/HHS-HCC) 07/17/2016  . Hyperlipidemia, mixed 07/17/2016  . Symptomatic PVCs 07/17/2016    Surgical History: Past Surgical History:  Procedure Laterality Date  . LAMINECTOMY LUMBAR SPINE  1993  .  Right total knee arthroplasty using computer-assisted navigation  01/02/2020   Dr Mardee  . Left total knee arthroplasty using computer-assisted navigation  11/19/2020   Dr Mardee  . Open reduction and internal fixation of the left periprosthetic patella fracture  04/16/2021   Dr Mardee  . Open reduction and internal fixation of the left patella; removal of retained hardware  05/10/2021   Dr Mardee    Social History:  reports that he quit smoking about 45 years ago. His  smoking use included cigarettes. He has been exposed to tobacco smoke. He has never used smokeless tobacco. He reports that he does not drink alcohol and does not use drugs.  Family History: family history includes Alzheimer's disease in his mother; No Known Problems in his father.  Medications: Current Outpatient Medications  Medication Sig Dispense Refill  . apixaban  (ELIQUIS ) 5 mg tablet Take 1 tablet (5 mg total) by mouth 2 (two) times daily 180 tablet 3  . APPLE CIDER VINEGAR ORAL Take by mouth 2 (two) times daily    . cyanocobalamin  (VITAMIN B12) 500 MCG tablet Take 500 mcg by mouth once daily    . dilTIAZem  (CARDIZEM  CD) 300 MG CD capsule Take 1 capsule (300 mg total) by mouth at bedtime 90 capsule 3  . fenofibrate  160 MG tablet TAKE 1 TABLET BY MOUTH ONCE  DAILY 100 tablet 2  . flash glucose sensor (FREESTYLE LIBRE 14 DAY SENSOR) kit USE SENSOR AND CHANGE EVERY 14  DAYS 6 kit 3  . FUROsemide  (LASIX ) 20 MG tablet Take 1 tablet (20 mg total) by mouth once daily as needed 90 tablet 3  . glipiZIDE  (GLUCOTROL ) 5 MG tablet TAKE 1 TABLET BY MOUTH TWICE  DAILY 200 tablet 2  . hydroxychloroquine  (PLAQUENIL ) 200 mg tablet Take 1 tablet (200 mg total) by mouth 2 (two) times daily 180 tablet 1  . insulin  NPH-REGULAR (HUMULIN  70/30  U-100 KWIKPEN) 100 unit/mL (70-30) pen injector INJECT SUBCUTANEOUSLY 55 UNITS  WITH BREAKFAST AND 25 UNITS WITH SUPPER 90 mL 3  . lovastatin  (MEVACOR ) 40 MG tablet Take 1 tablet (40 mg total) by mouth daily with dinner 100 tablet 2  . metFORMIN  (GLUCOPHAGE ) 1000 MG tablet TAKE 1 TABLET BY MOUTH TWICE  DAILY 200 tablet 3  . metoprolol  succinate (TOPROL -XL) 100 MG XL tablet TAKE 1 TABLET BY MOUTH ONCE  DAILY 100 tablet 2  . multivitamin tablet Take 1 tablet by mouth once daily For diabetics      . mupirocin (BACTROBAN) 2 % ointment once daily    . sildenafiL (VIAGRA) 100 MG tablet TAKE 1 TABLET (100 MG TOTAL) BY MOUTH ONCE DAILY AS NEEDED FOR ERECTILE DYSFUNCTION FOR UP  TO 30 DAYS 10 tablet 0  . triamterene -hydroCHLOROthiazide  (DYAZIDE ) 37.5-25 mg capsule TAKE 1 CAPSULE BY MOUTH IN THE  MORNING (Patient taking differently: As needed per patient.) 100 capsule 2  . venlafaxine  (EFFEXOR -XR) 75 MG XR capsule Take 1 capsule (75 mg total) by mouth once daily 90 capsule 3   No current facility-administered medications for this visit.    Allergies: Allergies  Allergen Reactions  . Gabapentin  Other (See Comments)    Confusion    Physical Exam: Vitals:   05/04/23 1604  BP: 110/68  Pulse: 84  SpO2: 93%  Weight: (!) 119.7 kg (264 lb)  Height: 188 cm (6' 2)     Body mass index is 33.9 kg/m. Gen: WDWN in NAD    Physical exam otherwise deferred due to coronavirus precautions.   Labs: 10/11/2019:  A1c = 6.9.  01/11/2020:  A1c = 7.4.  B12 = 1268.   07/11/2020:  A1c = 8.2.  K/Cr/Ca = 4.5/1.3/9.8, eGFR = 34.  MA = 6.3.  Chol = 147/274/27.9/64.  LFTs nl.  TSH = 1.62.  B12 = 254.  10/23/2020:  A1c= 8.4.   11/14/2020:  Fructosamine = 260 (A1c ~ 6.1).   04/16/2021:  K/Cr/Ca = 4.2/1.07/9.3, eGFR < 60.  LFTs nl.  07/15/2021:  A1c = 7.3 10/14/2021:  A1c = 8.7.  K/Cr/Ca = 3.8/1.2/9.5.  Chol = 150/244/30/71.  LFTs nl.  TSH = 1.654.  B12 = 239.  11/13/2021:  MA = 22.3 12/10/2021:  K/Cr/Ca = 4.8/1.5/10.3, eGFR = 46.   05/12/2022:  A1c = 8.7 08/25/2022:  A1c = 7.8 12/03/2022:  A1c = 7.9.  K/Cr/Ca = 5.0/1.5/10.1, eGFR = 49.  MA = 20.9.  Chol = 113/191/33.1/42.  LFTs nl.  TSH = 1.54. 02/06/2023:  A1c = 7.8 02/11/2023:  K/Cr/Ca = 4.3/1.02/9.0.   Assessment/Plan: 1.  Diabetes.  A1c was not done prior to today's visit as it is short of the 3 month mark.  His A1c from 10/24 was 7.8.  Based on review of his CGM, I will change his insulin  to 60 units qam and 15 units pm.  If sugars elevate, I told him to let me know so I can download his data and review it to make further adjustments.  Consider switching insulins next visit if his A1c is still elevated.  We previously discussed  stopping glipizide  since he is on insulin .  He thought his sugars worsened so went back on it.  I encouraged lifestyle modifications.    2.  HTN associated with diabetes.  His BP is great today on multiple agents though no ACE/ARB.  Consider adding one in the future if MA goes up.   3.  HLD associated with diabetes.  Her  lipids in 8/24 were good on fenofibrarte 160 mg and lovastatin  40 mg, though his triglyceride were slightly elevated.    4.  Neuropathy.  He is currently on Gabapentin  900 mg daily.  5.  B12 deficiency.  His B12 was low in 3/22 and again in 6/23.  He is taking sublingual supplementation daily.   6.  Retinopathy.  We last got a copy from the eye doctor (Dr. Myrna, [facility]) from 01/02/23.  He had background retinopathy but no trx is necessary.  He is to f/u every 6 months.    7.  CVD.  He was admitted for CHF in 8/23.   He also had a LLE  angioplasty in 10/24.  He is being followed by cardiology and vascular surgery.  8.  Foot ulcer.   He was admitted in 10/24 for osteomyelitis of toe and had a left great toe amputation.  He is being followed by podiatry for it and last saw them in 12/24.  He says he is now released but plans to go back to ask about his hammer toes.  9.  Prophylaxis.  I will defer a foot exam as he last saw podiatry in 12/24 as above.  We last got a copy from the eye doctor in 9/24 as above.   10.  He will return to clinic in 4 months.     This note is partially prepared by Sherrilee Patient, Scribe, in the presence of and acting as the scribe of Isabelle Breaker, MD.    DEBBY LEODIS BREAKER, MD

## 2023-07-17 ENCOUNTER — Other Ambulatory Visit (INDEPENDENT_AMBULATORY_CARE_PROVIDER_SITE_OTHER): Payer: Self-pay | Admitting: Nurse Practitioner

## 2023-07-17 DIAGNOSIS — I739 Peripheral vascular disease, unspecified: Secondary | ICD-10-CM

## 2023-07-19 NOTE — Progress Notes (Deleted)
 MRN : [id number]  Kristopher Oneal is a 73 y.o. (Sep 09, 1950) male who presents with chief complaint of check circulation.  History of Present Illness:   The patient returns to the office for followup regarding atherosclerotic changes of the lower extremities and review of the noninvasive studies.   There have been no interval changes in lower extremity symptoms. No interval shortening of the patient's claudication distance or development of rest pain symptoms. No new ulcers or wounds have occurred since the last visit.  There have been no significant changes to the patient's overall health care.  The patient denies amaurosis fugax or recent TIA symptoms. There are no documented recent neurological changes noted. There is no history of DVT, PE or superficial thrombophlebitis. The patient denies recent episodes of angina or shortness of breath.   ABI Rt=*** and Lt=***  (previous ABI's Rt=*** and Lt=***) Duplex ultrasound of the ***  No outpatient medications have been marked as taking for the 07/20/23 encounter (Appointment) with Gilda Crease, Latina Craver, MD.    Past Medical History:  Diagnosis Date   Anxiety    Arrhythmia    atrial fibrillation   Arthritis    CHF (congestive heart failure) (HCC)    Diabetes mellitus without complication (HCC)    type 2   Dysrhythmia    PVC/ a fib   Heart murmur    High cholesterol    Hypertension     Past Surgical History:  Procedure Laterality Date   AMPUTATION TOE Left 02/10/2023   Procedure: Left Hallux Amputation;  Surgeon: Linus Galas, DPM;  Location: [facility] ORS;  Service: Orthopedics/Podiatry;  Laterality: Left;   BACK SURGERY     l5/s1   COLONOSCOPY     KNEE ARTHROPLASTY Right 01/02/2020   Procedure: COMPUTER ASSISTED TOTAL KNEE ARTHROPLASTY;  Surgeon: Donato Heinz, MD;  Location: [facility] OR[facility];  Service: Orthopedics;  Laterality: Right;   KNEE ARTHROPLASTY Left 11/19/2020    Procedure: COMPUTER ASSISTED TOTAL KNEE ARTHROPLASTY;  Surgeon: Donato Heinz, MD;  Location: [facility];  Service: Orthopedics;  Laterality: Left;   LOWER EXTREMITY ANGIOGRAPHY Left 02/10/2023   Procedure: Lower Extremity Angiography;  Surgeon: Renford Dills, MD;  Location: [facility];  Service: Cardiovascular;  Laterality: Left;   ORIF PATELLA Left 04/16/2021   Procedure: OPEN REDUCTION INTERNAL (ORIF) FIXATION PATELLA;  Surgeon: Donato Heinz, MD;  Location: [facility];  Service: Orthopedics;  Laterality: Left;   ORIF PATELLA Left 05/10/2021   Procedure: OPEN REDUCTION INTERNAL (ORIF) FIXATION PATELLA;  Surgeon: Donato Heinz, MD;  Location: [facility];  Service: Orthopedics;  Laterality: Left;    Social History Social History   Tobacco Use   Smoking status: Former    Current packs/day: 0.00    Types: Cigarettes    Quit date: 1980    Years since quitting: 45.2   Smokeless tobacco: Never   Tobacco comments:    1980  Vaping Use   Vaping status: Never Used  Substance Use Topics   Alcohol use: Yes    Comment: rarely   Drug use: Never    Family  History Family History  Problem Relation Age of Onset   Dementia Mother    Heart attack Father    Emphysema Father    Heart attack Brother     Allergies  Allergen Reactions   Gabapentin Other (See Comments)    Confusion     REVIEW OF SYSTEMS (Negative unless checked)  Constitutional: [] Weight loss  [] Fever  [] Chills Cardiac: [] Chest pain   [] Chest pressure   [] Palpitations   [] Shortness of breath when laying flat   [] Shortness of breath with exertion. Vascular:  [x] Pain in legs with walking   [] Pain in legs at rest  [] History of DVT   [] Phlebitis   [] Swelling in legs   [] Varicose veins   [] Non-healing ulcers Pulmonary:   [] Uses home oxygen   [] Productive cough   [] Hemoptysis   [] Wheeze  [] COPD   [] Asthma Neurologic:  [] Dizziness   [] Seizures   [] History of stroke   [] History of TIA  [] Aphasia   [] Vissual changes    [] Weakness or numbness in arm   [] Weakness or numbness in leg Musculoskeletal:   [] Joint swelling   [] Joint pain   [] Low back pain Hematologic:  [] Easy bruising  [] Easy bleeding   [] Hypercoagulable state   [] Anemic Gastrointestinal:  [] Diarrhea   [] Vomiting  [] Gastroesophageal reflux/heartburn   [] Difficulty swallowing. Genitourinary:  [] Chronic kidney disease   [] Difficult urination  [] Frequent urination   [] Blood in urine Skin:  [] Rashes   [] Ulcers  Psychological:  [] History of anxiety   []  History of major depression.  Physical Examination  There were no vitals filed for this visit. There is no height or weight on file to calculate BMI. Gen: WD/WN, NAD Head: E[place]/AT, No temporalis wasting.  Ear/Nose/Throat: Hearing grossly intact, nares w/o erythema or drainage Eyes: PER, EOMI, sclera nonicteric.  Neck: Supple, no masses.  No bruit or JVD.  Pulmonary:  Good air movement, no audible wheezing, no use of accessory muscles.  Cardiac: RRR, normal S1, S2, no Murmurs. Vascular:  mild trophic changes, no open wounds Vessel Right Left  Radial Palpable Palpable  PT Not Palpable Not Palpable  DP Not Palpable Not Palpable  Gastrointestinal: soft, non-distended. No guarding/no peritoneal signs.  Musculoskeletal: M/S 5/5 throughout.  No visible deformity.  Neurologic: CN 2-12 intact. Pain and light touch intact in extremities.  Symmetrical.  Speech is fluent. Motor exam as listed above. Psychiatric: Judgment intact, Mood & affect appropriate for pt's clinical situation. Dermatologic: No rashes or ulcers noted.  No changes consistent with cellulitis.   CBC Lab Results  Component Value Date   WBC 12.0 (H) 02/11/2023   HGB 13.7 02/11/2023   HCT 40.9 02/11/2023   MCV 93.0 02/11/2023   PLT 379 02/11/2023    BMET    Component Value Date/Time   NA 134 (L) 02/11/2023 0430   K 4.3 02/11/2023 0430   CL 102 02/11/2023 0430   CO2 23 02/11/2023 0430   GLUCOSE 185 (H) 02/11/2023 0430   BUN 21  02/11/2023 0430   CREATININE 1.02 02/11/2023 0430   CALCIUM 9.0 02/11/2023 0430   GFRNONAA >60 02/11/2023 0430   GFRAA >60 12/27/2019 1108   CrCl cannot be calculated (Patient's most recent lab result is older than the maximum 21 days allowed.).  COAG Lab Results  Component Value Date   INR 1.2 02/05/2023   INR 1.1 12/02/2021   INR 1.0 11/08/2020    Radiology No results found.   Assessment/Plan There are no diagnoses linked to this encounter.   Levora Dredge,  MD  07/19/2023 3:43 PM

## 2023-07-20 ENCOUNTER — Ambulatory Visit (INDEPENDENT_AMBULATORY_CARE_PROVIDER_SITE_OTHER): Payer: Medicare Other | Admitting: Vascular Surgery

## 2023-07-20 ENCOUNTER — Encounter (INDEPENDENT_AMBULATORY_CARE_PROVIDER_SITE_OTHER): Payer: Medicare Other

## 2023-07-20 DIAGNOSIS — I10 Essential (primary) hypertension: Secondary | ICD-10-CM

## 2023-07-20 DIAGNOSIS — I4891 Unspecified atrial fibrillation: Secondary | ICD-10-CM

## 2023-07-20 DIAGNOSIS — E119 Type 2 diabetes mellitus without complications: Secondary | ICD-10-CM

## 2023-07-20 DIAGNOSIS — I739 Peripheral vascular disease, unspecified: Secondary | ICD-10-CM

## 2023-07-20 DIAGNOSIS — E782 Mixed hyperlipidemia: Secondary | ICD-10-CM

## 2023-07-29 DIAGNOSIS — N1831 Chronic kidney disease, stage 3a: Secondary | ICD-10-CM | POA: Insufficient documentation

## 2023-08-20 ENCOUNTER — Encounter (INDEPENDENT_AMBULATORY_CARE_PROVIDER_SITE_OTHER)

## 2023-08-20 ENCOUNTER — Ambulatory Visit (INDEPENDENT_AMBULATORY_CARE_PROVIDER_SITE_OTHER): Admitting: Vascular Surgery

## 2023-09-08 ENCOUNTER — Encounter (INDEPENDENT_AMBULATORY_CARE_PROVIDER_SITE_OTHER): Payer: Self-pay

## 2023-09-17 ENCOUNTER — Ambulatory Visit (INDEPENDENT_AMBULATORY_CARE_PROVIDER_SITE_OTHER): Admitting: Vascular Surgery

## 2023-09-17 ENCOUNTER — Encounter (INDEPENDENT_AMBULATORY_CARE_PROVIDER_SITE_OTHER)

## 2023-09-22 NOTE — Progress Notes (Signed)
 Patient Profile:   Kristopher Oneal  is a 73 y.o.  male No chief complaint on file.     PROBLEM LIST: Past Medical History:  Diagnosis Date  . A-fib (CMS/HHS-HCC)    Per patient  . Benign essential hypertension 07/17/2016  . Chronic atrial fibrillation (CMS/HHS-HCC)   . Chronic painful diabetic neuropathy (CMS/HHS-HCC) 07/17/2016  . Controlled type 2 diabetes mellitus with complication, with long-term current use of insulin  (CMS/HHS-HCC) 07/17/2016  . Hyperlipidemia, mixed 07/17/2016  . 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 LAMINECTOMY LUMBAR SPINE  1993  .  Right total knee arthroplasty using computer-assisted navigation  01/02/2020   Dr Mardee  . Left total knee arthroplasty using computer-assisted navigation  11/19/2020   Dr Mardee  . Open reduction and internal fixation of the left periprosthetic patella fracture  04/16/2021   Dr Mardee  . Open reduction and internal fixation of the left patella; removal of retained hardware  05/10/2021   Dr Hooten    ALLERGIES: Allergies  Allergen Reactions  . Gabapentin  Other (See Comments)    Confusion    CURRENT MEDICATIONS: Current Outpatient Medications  Medication Sig Dispense Refill  . apixaban  (ELIQUIS ) 5 mg tablet Take 1 tablet (5 mg total) by mouth 2 (two) times daily 180 tablet 3  . APPLE CIDER VINEGAR ORAL Take by mouth 2 (two) times daily    . CARTIA  XT 300 mg CD capsule TAKE 1 CAPSULE BY MOUTH AT  BEDTIME 100 capsule 2  . clopidogreL  (PLAVIX ) 75 mg tablet Take 75 mg by mouth daily with breakfast    . cyanocobalamin  (VITAMIN B12) 500 MCG tablet Take 500 mcg by mouth once daily    . donepeziL (ARICEPT) 5 MG tablet Take 1 tablet (5 mg total) by mouth at bedtime 90 tablet 3  . escitalopram oxalate (LEXAPRO) 10 MG tablet Take 1 tablet (10 mg total) by mouth once daily 90 tablet 3  . fenofibrate  160 MG tablet TAKE 1 TABLET BY MOUTH ONCE   DAILY 100 tablet 2  . flash glucose sensor (FREESTYLE LIBRE 14 DAY SENSOR) kit USE SENSOR AND CHANGE EVERY 14  DAYS 6 kit 3  . glipiZIDE  (GLUCOTROL ) 5 MG tablet TAKE 1 TABLET BY MOUTH TWICE  DAILY 200 tablet 2  . hydroxychloroquine  (PLAQUENIL ) 200 mg tablet Take 1 tablet (200 mg total) by mouth 2 (two) times daily 180 tablet 1  . insulin  NPH-REGULAR (HUMULIN  70/30 U-100 KWIKPEN) 100 unit/mL (70-30) pen injector INJECT SUBCUTANEOUSLY 55 UNITS  WITH BREAKFAST AND 25 UNITS WITH SUPPER 90 mL 3  . lovastatin  (MEVACOR ) 40 MG tablet TAKE 1 TABLET BY MOUTH DAILY  WITH DINNER 100 tablet 2  . metFORMIN  (GLUCOPHAGE ) 1000 MG tablet Take 1 tablet (1,000 mg total) by mouth 2 (two) times daily 200 tablet 3  . metoprolol  SUCCinate (TOPROL -XL) 100 MG XL tablet TAKE 1 TABLET BY MOUTH ONCE  DAILY 100 tablet 2  . multivitamin tablet Take 1 tablet by mouth once daily For diabetics      . sildenafiL (VIAGRA) 100 MG tablet TAKE 1 TABLET (100 MG TOTAL) BY MOUTH ONCE DAILY AS NEEDED FOR ERECTILE DYSFUNCTION FOR UP TO 30 DAYS 10 tablet 0  . triamterene -hydroCHLOROthiazide  (DYAZIDE ) 37.5-25 mg capsule TAKE 1 CAPSULE BY MOUTH IN THE  MORNING 100 capsule 2  No current facility-administered medications for this visit.      HPI   CLINICAL SUMMARY:  Patient's memory is short-term.  A lot of it is lack of concentration when his wife talks.  He is taking the Lexapro and the Aricept.  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  No fevers, chills, or constitutional symptoms.   PHYSICAL EXAM  Vital signs:  BP 112/64   Pulse 92   Wt (!) 120.5 kg (265 lb 9.6 oz)   SpO2 95%   BMI 34.10 kg/m  Body mass index is 34.1 kg/m.   Wt Readings from Last 3 Encounters:  09/22/23 (!) 120.5 kg (265 lb 9.6 oz)  09/01/23 (!) 122.6 kg (270 lb  3.2 oz)  07/29/23 (!) 120.9 kg (266 lb 9.6 oz)     BP Readings from Last 3 Encounters:  09/22/23 112/64  09/01/23 124/78  07/29/23 130/80    Constitutional:NAD Neck: supple, no thyromegaly, good ROM Respiratory:clear to auscultation, no rales or wheezes Cardiovascular:RRR, no murmur or gallop Abdominal:soft, good BS, NT Ext: no edema, good peripheral pulses Neuro: alert and oriented X 3, grossly nonfocal     ASSESSMENT/PLAN   Mild depression/cognitive impairment-all goes together.  I do think he has a mild cognitive issue but it is very mild, maintain Aricept.  Also does have some mild depression, maintain Lexapro.  Totally normal clock drawing very quickly.  After much discussion the main thing I think would help him is a part-time job, 2-3 days/week, has to be around people, part of routine, challenged him to do that, I think about [employer] or [employer] Insulin -dependent diabetes-stable  Dispo:   No follow-ups on file.

## 2023-10-20 ENCOUNTER — Encounter (INDEPENDENT_AMBULATORY_CARE_PROVIDER_SITE_OTHER): Payer: Self-pay | Admitting: Nurse Practitioner

## 2023-10-20 ENCOUNTER — Other Ambulatory Visit (INDEPENDENT_AMBULATORY_CARE_PROVIDER_SITE_OTHER)

## 2023-10-20 ENCOUNTER — Ambulatory Visit (INDEPENDENT_AMBULATORY_CARE_PROVIDER_SITE_OTHER): Admitting: Nurse Practitioner

## 2023-10-20 VITALS — BP 115/74 | HR 64 | Ht 74.0 in | Wt 267.5 lb

## 2023-10-20 DIAGNOSIS — E1169 Type 2 diabetes mellitus with other specified complication: Secondary | ICD-10-CM | POA: Diagnosis not present

## 2023-10-20 DIAGNOSIS — Z9889 Other specified postprocedural states: Secondary | ICD-10-CM

## 2023-10-20 DIAGNOSIS — I739 Peripheral vascular disease, unspecified: Secondary | ICD-10-CM

## 2023-10-20 DIAGNOSIS — E782 Mixed hyperlipidemia: Secondary | ICD-10-CM

## 2023-10-20 DIAGNOSIS — I10 Essential (primary) hypertension: Secondary | ICD-10-CM | POA: Diagnosis not present

## 2023-10-20 NOTE — Progress Notes (Unsigned)
 Cardiology Office Note    Date:  10/21/2023   ID:  Kristopher Oneal, DOB 22-May-1950, MRN [id number]  PCP:  Kristopher Oneil FALCON, MD  Cardiologist:  Kristopher Lunger, MD  Electrophysiologist:  None   Chief Complaint: Follow-up  History of Present Illness:   Kristopher Oneal is a 73 y.o. male with history of HFpEF, permanent A-fib, DM2, HTN, HLD, PAD status post angioplasty of the left posterior tibial artery and left tibioperoneal trunk in 01/2023 followed by vascular surgery, renal dysfunction, and obesity who presents for follow-up of HFpEF and A-fib.  Patient's A-fib dates back to 2020.  Echo at that time showed an EF of 50%, normal wall motion, mildly enlarged left atrium, trivial aortic insufficiency and mitral regurg, and normal RV systolic function.  He was admitted in 10/2021 with worsening shortness of breath with acute on chronic HFpEF and A-fib with RVR.  Echo in 11/2021 showed an EF of 60 to 65%, no regional wall motion abnormalities, mild LVH, normal RV systolic function and ventricular cavity size, aortic valve sclerosis without evidence of stenosis, estimated right atrial pressure of 3 mmHg, borderline dilatation of the aortic root measuring 38 mm, and a rhythm of A-fib.  He was last seen in the office in 06/2022 with no changes indicated at that time.  He comes in doing well from a cardiac perspective and is without symptoms of angina or cardiac decompensation.  No palpitations, dizziness, presyncope, or syncope.  He has had 1 mechanical fall since he was last seen, though did not hit or suffer LOC.  No hematochezia or melena.  Remains adherent to apixaban .  Reports he is no longer on clopidogrel  (was on for PAD).  Notes bilateral lower extremity weakness with note from vascular surgery yesterday indicating follow-up imaging was stable with no symptoms felt to be related to neurogenic claudication.  Not taking furosemide , taking triamterene /HCTZ on an as needed basis.   Labs independently  reviewed: 07/2023 - Hgb 14.5, PLT 262, potassium 4.8, BUN 36, serum creatinine 1.6, albumin  4.3, AST/ALT normal, A1c 7.6, TC 127, TG 142, HDL 31, LDL 67, TSH normal 01/2023 - magnesium  2.1  Past Medical History:  Diagnosis Date   Anxiety    Arrhythmia    atrial fibrillation   Arthritis    CHF (congestive heart failure) (HCC)    Diabetes mellitus without complication (HCC)    type 2   Dysrhythmia    PVC/ a fib   Heart murmur    High cholesterol    Hypertension     Past Surgical History:  Procedure Laterality Date   AMPUTATION TOE Left 02/10/2023   Procedure: Left Hallux Amputation;  Surgeon: Kristopher Oneal, DPM;  Location: [facility];  Service: Orthopedics/Podiatry;  Laterality: Left;   BACK SURGERY     l5/s1   COLONOSCOPY     KNEE ARTHROPLASTY Right 01/02/2020   Procedure: COMPUTER ASSISTED TOTAL KNEE ARTHROPLASTY;  Surgeon: Kristopher Lynwood SQUIBB, MD;  Location: [facility];  Service: Orthopedics;  Laterality: Right;   KNEE ARTHROPLASTY Left 11/19/2020   Procedure: COMPUTER ASSISTED TOTAL KNEE ARTHROPLASTY;  Surgeon: Kristopher Lynwood SQUIBB, MD;  Location: [facility];  Service: Orthopedics;  Laterality: Left;   LOWER EXTREMITY ANGIOGRAPHY Left 02/10/2023   Procedure: Lower Extremity Angiography;  Surgeon: Kristopher Cordella MATSU, MD;  Location: [facility];  Service: Cardiovascular;  Laterality: Left;   ORIF PATELLA Left 04/16/2021   Procedure: OPEN REDUCTION INTERNAL (ORIF) FIXATION PATELLA;  Surgeon: Kristopher Lynwood SQUIBB, MD;  Location: [facility];  Service: Orthopedics;  Laterality: Left;   ORIF PATELLA Left 05/10/2021   Procedure: OPEN REDUCTION INTERNAL (ORIF) FIXATION PATELLA;  Surgeon: Kristopher Lynwood SQUIBB, MD;  Location: [facility];  Service: Orthopedics;  Laterality: Left;    Current Medications: Current Meds  Medication Sig   Continuous Blood Gluc Sensor (FREESTYLE LIBRE 14 DAY SENSOR) MISC SMARTSIG:1 Kit(s) Topical Every 2 Weeks   diltiazem  (CARDIZEM  CD) 300 MG 24 hr capsule Take 1 capsule (300 mg  total) by mouth at bedtime.   doxycycline  (VIBRAMYCIN ) 100 MG capsule Take 1 capsule (100 mg total) by mouth 2 (two) times daily.   ELIQUIS  5 MG TABS tablet Take 1 tablet (5 mg total) by mouth 2 (two) times daily.   escitalopram (LEXAPRO) 10 MG tablet Take 10 mg by mouth daily.   fenofibrate  160 MG tablet Take 160 mg by mouth daily.    fluticasone  (FLONASE ) 50 MCG/ACT nasal spray Place 1 spray into the nose daily as needed for allergies.   glipiZIDE  (GLUCOTROL ) 5 MG tablet Take 5 mg by mouth 2 (two) times daily.   hydroxychloroquine  (PLAQUENIL ) 200 MG tablet Take 200 mg by mouth 2 (two) times daily.   insulin  NPH-regular Human (HUMULIN  70/30) (70-30) 100 UNIT/ML injection Inject 25-50 Units into the skin See admin instructions. Inject 50 units into the skin in the morning and 30 units in the evening   lovastatin  (MEVACOR ) 40 MG tablet Take 1 tablet (40 mg total) by mouth daily with supper.   metFORMIN  (GLUCOPHAGE ) 1000 MG tablet Take 1,000 mg by mouth 2 (two) times daily with a meal.    metoprolol  succinate (TOPROL -XL) 100 MG 24 hr tablet Take 1 tablet (100 mg total) by mouth at bedtime.   Multiple Vitamins-Minerals (ADVANCED DIABETIC MULTIVITAMIN) TABS Take 1 tablet by mouth daily.   mupirocin ointment (BACTROBAN) 2 % Apply 1 Application topically daily.   sildenafil (VIAGRA) 100 MG tablet Take 100 mg by mouth daily as needed.   triamterene -hydrochlorothiazide  (DYAZIDE ) 37.5-25 MG capsule Take 1 capsule by mouth as needed (per patient).   venlafaxine  XR (EFFEXOR -XR) 75 MG 24 hr capsule Take 75 mg by mouth daily with breakfast.   vitamin B-12 (CYANOCOBALAMIN ) 500 MCG tablet Take 1,000 mcg by mouth daily.    Allergies:   Gabapentin    Social History   Socioeconomic History   Marital status: Married    Spouse name: Not on file   Number of children: Not on file   Years of education: Not on file   Highest education level: Not on file  Occupational History   Not on file  Tobacco Use    Smoking status: Former    Current packs/day: 0.00    Types: Cigarettes    Quit date: 1980    Years since quitting: 45.5   Smokeless tobacco: Never   Tobacco comments:    1980  Vaping Use   Vaping status: Never Used  Substance and Sexual Activity   Alcohol use: Yes    Comment: rarely   Drug use: Never   Sexual activity: Not Currently  Other Topics Concern   Not on file  Social History Narrative   Not on file   Social Drivers of Health   Financial Resource Strain: Low Risk  (07/29/2023)   Received from [facility]   Overall Financial Resource Strain (CARDIA)    Difficulty of Paying Living Expenses: Not hard at all  Food Insecurity: No Food Insecurity (07/29/2023)   Received from [facility]  Hunger Vital Sign    Within the past 12 months, you worried that your food would run out before you got the money to buy more.: Never true    Within the past 12 months, the food you bought just didn't last and you didn't have money to get more.: Never true  Transportation Needs: No Transportation Needs (07/29/2023)   Received from [facility] - Transportation    In the past 12 months, has lack of transportation kept you from medical appointments or from getting medications?: No    Lack of Transportation (Non-Medical): No  Physical Activity: Not on file  Stress: Not on file  Social Connections: Not on file     Family History:  The patient's family history includes Dementia in his mother; Emphysema in his father; Heart attack in his brother and father.  ROS:   12-point review of systems is negative unless otherwise noted in the HPI.   EKGs/Labs/Other Studies Reviewed:    Studies reviewed were summarized above. The additional studies were reviewed today:  2D echo 12/02/2021: 1. Left ventricular ejection fraction, by estimation, is 60 to 65%. The  left ventricle has normal function. The left ventricle has no regional  wall  motion abnormalities. There is mild left ventricular hypertrophy.  Left ventricular diastolic parameters  are indeterminate.   2. Right ventricular systolic function is normal. The right ventricular  size is normal.   3. The mitral valve is normal in structure. No evidence of mitral valve  regurgitation. No evidence of mitral stenosis.   4. The aortic valve is normal in structure. Aortic valve regurgitation is  not visualized. Aortic valve sclerosis/calcification is present, without  any evidence of aortic stenosis.   5. The inferior vena cava is normal in size with greater than 50%  respiratory variability, suggesting right atrial pressure of 3 mmHg.   6. Rhythm is atrial fibrillation   7. There is borderline dilatation of the aortic root, measuring 38 mm.    EKG:  EKG is ordered today.  The EKG ordered today demonstrates A-fib, 67 bpm, poor R wave progression along the precordial leads, no acute ST-T changes  Recent Labs: 02/05/2023: ALT 43 02/11/2023: BUN 21; Creatinine, Ser 1.02; Hemoglobin 13.7; Magnesium  2.1; Platelets 379; Potassium 4.3; Sodium 134  Recent Lipid Panel No results found for: CHOL, TRIG, HDL, CHOLHDL, VLDL, LDLCALC, LDLDIRECT  PHYSICAL EXAM:    VS:  BP 100/62 (BP Location: Left Arm, Patient Position: Sitting, Cuff Size: Normal)   Pulse 67   Ht 6' 2 (1.88 m)   Wt 267 lb (121.1 kg)   SpO2 96%   BMI 34.28 kg/m   BMI: Body mass index is 34.28 kg/m.  Physical Exam Vitals reviewed.  Constitutional:      Appearance: He is well-developed.  HENT:     Head: Normocephalic and atraumatic.  Eyes:     General:        Right eye: No discharge.        Left eye: No discharge.  Cardiovascular:     Rate and Rhythm: Normal rate. Rhythm irregularly irregular.     Heart sounds: Normal heart sounds, S1 normal and S2 normal. Heart sounds not distant. No midsystolic click and no opening snap. No murmur heard.    No friction rub.  Pulmonary:     Effort:  Pulmonary effort is normal. No respiratory distress.     Breath sounds: Normal breath sounds. No decreased breath sounds, wheezing, rhonchi or  rales.  Chest:     Chest wall: No tenderness.  Musculoskeletal:     Cervical back: Normal range of motion.     Right lower leg: No edema.     Left lower leg: No edema.  Skin:    General: Skin is warm and dry.     Nails: There is no clubbing.  Neurological:     Mental Status: He is alert and oriented to person, place, and time.  Psychiatric:        Speech: Speech normal.        Behavior: Behavior normal.        Thought Content: Thought content normal.        Judgment: Judgment normal.     Wt Readings from Last 3 Encounters:  10/21/23 267 lb (121.1 kg)  10/20/23 267 lb 8 oz (121.3 kg)  04/20/23 265 lb 12.8 oz (120.6 kg)     ASSESSMENT & PLAN:   HFpEF: Euvolemic and well compensated, not requiring standing loop diuretic.  Remains on HCTZ on an as-needed basis.  With underlying renal dysfunction defer escalation of MRA.  Could consider addition of SGLT2 inhibitor given HFpEF and underlying diabetes, will defer to PCP.  Permanent A-fib: Asymptomatic with well-controlled ventricular response on Cardizem  CD 300 mg and Toprol -XL 100 mg.  CHA2DS2-VASc at least 5 (CHF, HTN, age x 2, DM, vascular disease).  On apixaban  5 mg twice daily and does not meet reduced dosing criteria.  No symptoms concerning for bleeding with recent labs stable.  HTN: Blood pressure is well-controlled in the office today.  He remains on Cardizem  CD 300 mg, Toprol -XL 100 mg, and triamterene /HCTZ.  HLD: LDL 67 in 07/2023.  Remains on lovastatin  40 mg.  PAD: Status post intervention by vascular surgery in 01/2023.  Followed up with vascular surgery on 10/20/2023 with ABIs pending from that visit.  Clopidogrel  managed by vascular surgery.  Follow-up with vascular surgery as directed.  Renal dysfunction: Renal function seems to fluctuate between 1 and 1.5-1.6.  Follow-up with  PCP.     Disposition: F/u with Dr. Gollan or an APP in 12 months.   Medication Adjustments/Labs and Tests Ordered: Current medicines are reviewed at length with the patient today.  Concerns regarding medicines are outlined above. Medication changes, Labs and Tests ordered today are summarized above and listed in the Patient Instructions accessible in Encounters.   Signed, Bernardino Bring, PA-C 10/21/2023 3:14 PM     [facility] [place] [address] [place], [place] [address] [phone]

## 2023-10-20 NOTE — Progress Notes (Signed)
 Subjective:    Patient ID: Kristopher Oneal, male    DOB: 1951/03/23, 73 y.o.   MRN: [id number] Chief Complaint  Patient presents with   F/u 3 months  ABI     The patient returns to the office for followup and review status post angiogram with intervention on 02/10/2023.   Procedure: Procedure(s) Performed:             1.  Introduction catheter into left lower extremity 3rd order catheter placement               2.  Contrast injection left lower extremity for distal runoff with additional 3rd order             3.  Percutaneous transluminal angioplasty left posterior tibial artery to 4 mm                          4.  Percutaneous transluminal angioplasty left tibioperoneal trunk to 4 mm             5.  Star close closure right common femoral arteriotomy  He has an amputation of the left great toe and has recently been noting some additional claudication-like symptoms.  He is also having some balance issues as well.  He does also endorse having back pain during these claudication episodes and has a history of previous intervention on his lower back.  There have been no significant changes to the patient's overall health care.  No documented history of amaurosis fugax or recent TIA symptoms. There are no recent neurological changes noted. No documented history of DVT, PE or superficial thrombophlebitis. The patient denies recent episodes of angina or shortness of breath.   ABI's Rt=1.36 and Lt=1.36  (previous ABI's Rt=1.29 and Lt=1.15) Duplex US  of the bilateral tibial vessels reveals strong triphasic waveforms bilaterally with normal toe waveforms bilaterally (second toe on the left)    Review of Systems  Cardiovascular:  Negative for leg swelling.  Musculoskeletal:  Positive for arthralgias, back pain and gait problem.  All other systems reviewed and are negative.      Objective:   Physical Exam Vitals reviewed.  HENT:     Head: Normocephalic.  Cardiovascular:     Rate and  Rhythm: Normal rate.     Pulses:          Dorsalis pedis pulses are detected w/ Doppler on the right side and detected w/ Doppler on the left side.       Posterior tibial pulses are detected w/ Doppler on the right side and detected w/ Doppler on the left side.  Pulmonary:     Effort: Pulmonary effort is normal.  Skin:    General: Skin is warm and dry.  Neurological:     Mental Status: He is alert and oriented to person, place, and time.  Psychiatric:        Mood and Affect: Mood normal.        Behavior: Behavior normal.        Thought Content: Thought content normal.        Judgment: Judgment normal.     BP 115/74   Pulse 64   Ht 6' 2 (1.88 m)   Wt 267 lb 8 oz (121.3 kg)   BMI 34.34 kg/m   Past Medical History:  Diagnosis Date   Anxiety    Arrhythmia    atrial fibrillation   Arthritis    CHF (congestive heart failure) (HCC)  Diabetes mellitus without complication (HCC)    type 2   Dysrhythmia    PVC/ a fib   Heart murmur    High cholesterol    Hypertension     Social History   Socioeconomic History   Marital status: Married    Spouse name: Not on file   Number of children: Not on file   Years of education: Not on file   Highest education level: Not on file  Occupational History   Not on file  Tobacco Use   Smoking status: Former    Current packs/day: 0.00    Types: Cigarettes    Quit date: 1980    Years since quitting: 45.5   Smokeless tobacco: Never   Tobacco comments:    1980  Vaping Use   Vaping status: Never Used  Substance and Sexual Activity   Alcohol use: Yes    Comment: rarely   Drug use: Never   Sexual activity: Not Currently  Other Topics Concern   Not on file  Social History Narrative   Not on file   Social Drivers of Health   Financial Resource Strain: Low Risk  (07/29/2023)   Received from [facility]   Overall Financial Resource Strain (CARDIA)    Difficulty of Paying Living Expenses: Not hard at all  Food  Insecurity: No Food Insecurity (07/29/2023)   Received from [facility]   Hunger Vital Sign    Within the past 12 months, you worried that your food would run out before you got the money to buy more.: Never true    Within the past 12 months, the food you bought just didn't last and you didn't have money to get more.: Never true  Transportation Needs: No Transportation Needs (07/29/2023)   Received from [facility] - Transportation    In the past 12 months, has lack of transportation kept you from medical appointments or from getting medications?: No    Lack of Transportation (Non-Medical): No  Physical Activity: Not on file  Stress: Not on file  Social Connections: Not on file  Intimate Partner Violence: Not At Risk (02/05/2023)   Humiliation, Afraid, Rape, and Kick questionnaire    Fear of Current or Ex-Partner: No    Emotionally Abused: No    Physically Abused: No    Sexually Abused: No    Past Surgical History:  Procedure Laterality Date   AMPUTATION TOE Left 02/10/2023   Procedure: Left Hallux Amputation;  Surgeon: Neill Boas, DPM;  Location: [facility];  Service: Orthopedics/Podiatry;  Laterality: Left;   BACK SURGERY     l5/s1   COLONOSCOPY     KNEE ARTHROPLASTY Right 01/02/2020   Procedure: COMPUTER ASSISTED TOTAL KNEE ARTHROPLASTY;  Surgeon: Mardee Lynwood SQUIBB, MD;  Location: [facility];  Service: Orthopedics;  Laterality: Right;   KNEE ARTHROPLASTY Left 11/19/2020   Procedure: COMPUTER ASSISTED TOTAL KNEE ARTHROPLASTY;  Surgeon: Mardee Lynwood SQUIBB, MD;  Location: [facility];  Service: Orthopedics;  Laterality: Left;   LOWER EXTREMITY ANGIOGRAPHY Left 02/10/2023   Procedure: Lower Extremity Angiography;  Surgeon: Jama Cordella MATSU, MD;  Location: [facility];  Service: Cardiovascular;  Laterality: Left;   ORIF PATELLA Left 04/16/2021   Procedure: OPEN REDUCTION INTERNAL (ORIF) FIXATION PATELLA;  Surgeon: Mardee Lynwood SQUIBB, MD;   Location: [facility];  Service: Orthopedics;  Laterality: Left;   ORIF PATELLA Left 05/10/2021   Procedure: OPEN REDUCTION INTERNAL (ORIF) FIXATION PATELLA;  Surgeon:  Hooten, Lynwood SQUIBB, MD;  Location: [facility];  Service: Orthopedics;  Laterality: Left;    Family History  Problem Relation Age of Onset   Dementia Mother    Heart attack Father    Emphysema Father    Heart attack Brother     Allergies  Allergen Reactions   Gabapentin  Other (See Comments)    Confusion       Latest Ref Rng & Units 02/11/2023    4:30 AM 02/10/2023    4:50 AM 02/09/2023    4:03 AM  CBC  WBC 4.0 - 10.5 K/uL 12.0  11.7  11.1   Hemoglobin 13.0 - 17.0 g/dL 86.2  85.5  86.1   Hematocrit 39.0 - 52.0 % 40.9  42.5  40.9   Platelets 150 - 400 K/uL 379  368  344       CMP     Component Value Date/Time   NA 134 (L) 02/11/2023 0430   K 4.3 02/11/2023 0430   CL 102 02/11/2023 0430   CO2 23 02/11/2023 0430   GLUCOSE 185 (H) 02/11/2023 0430   BUN 21 02/11/2023 0430   CREATININE 1.02 02/11/2023 0430   CALCIUM 9.0 02/11/2023 0430   PROT 8.8 (H) 02/05/2023 1104   ALBUMIN  4.4 02/05/2023 1104   AST 43 (H) 02/05/2023 1104   ALT 43 02/05/2023 1104   ALKPHOS 56 02/05/2023 1104   BILITOT 1.2 02/05/2023 1104   GFRNONAA >60 02/11/2023 0430     No results found.     Assessment & Plan:   1. PAD (peripheral artery disease) (HCC) (Primary) Recommend:  The patient is status post successful angiogram with intervention.  The patient reports that the amputation site has completely healed and leg pain has improved.  The patient denies lifestyle limiting changes at this point in time.  No further invasive studies, angiography or surgery at this time. The patient should continue walking and begin a more formal exercise program.  The patient should continue antiplatelet therapy and aggressive treatment of the lipid abnormalities  Continued surveillance is indicated as atherosclerosis is likely to progress with time.     Patient should undergo noninvasive studies as ordered. The patient will follow up in 3 months with noninvasive studies.  2. Primary hypertension Continue antihypertensive medications as already ordered, these medications have been reviewed and there are no changes at this time.  3. DM type 2 with diabetic mixed hyperlipidemia (HCC) Continue hypoglycemic medications as already ordered, these medications have been reviewed and there are no changes at this time.  Hgb A1C to be monitored as already arranged by primary service   4. Claudication [facility]) Based on his studies today I suspect that the patient has more neurogenic claudication.  I also suspect some of this may be caused by his toe amputation and some balance issues that may have resulted from that.  We evaluated his aortoiliac status during his angiogram and these were widely patent so I do not feel that it could be aortoiliac disease causing his symptoms.  Given his history of back intervention as well as his back pain during these episodes we will refer the patient to neurosurgery for further evaluation. - Ambulatory referral to Neurosurgery  Current Outpatient Medications on File Prior to Visit  Medication Sig Dispense Refill   clopidogrel  (PLAVIX ) 75 MG tablet Take 1 tablet (75 mg total) by mouth daily with breakfast. 30 tablet 1   Continuous Blood Gluc Sensor (FREESTYLE LIBRE 14 DAY SENSOR) MISC SMARTSIG:1 Kit(s) Topical  Every 2 Weeks     diltiazem  (CARDIZEM  CD) 300 MG 24 hr capsule Take 1 capsule (300 mg total) by mouth at bedtime. 90 capsule 3   doxycycline  (VIBRAMYCIN ) 100 MG capsule Take 1 capsule (100 mg total) by mouth 2 (two) times daily. 14 capsule 0   ELIQUIS  5 MG TABS tablet Take 1 tablet (5 mg total) by mouth 2 (two) times daily. 60 tablet 11   fenofibrate  160 MG tablet Take 160 mg by mouth daily.      fluticasone  (FLONASE ) 50 MCG/ACT nasal spray Place 1 spray into the nose daily as needed for allergies.      furosemide  (LASIX ) 20 MG tablet Take 1 tablet (20 mg total) by mouth daily. (Patient taking differently: Take 20 mg by mouth as needed for edema.) 90 tablet 3   glipiZIDE  (GLUCOTROL ) 5 MG tablet Take 5 mg by mouth 2 (two) times daily.     hydroxychloroquine  (PLAQUENIL ) 200 MG tablet Take 200 mg by mouth 2 (two) times daily.     insulin  NPH-regular Human (HUMULIN  70/30) (70-30) 100 UNIT/ML injection Inject 25-50 Units into the skin See admin instructions. Inject 50 units into the skin in the morning and 30 units in the evening     lovastatin  (MEVACOR ) 40 MG tablet Take 1 tablet (40 mg total) by mouth daily with supper. 90 tablet 3   metFORMIN  (GLUCOPHAGE ) 1000 MG tablet Take 1,000 mg by mouth 2 (two) times daily with a meal.      metoprolol  succinate (TOPROL -XL) 100 MG 24 hr tablet Take 1 tablet (100 mg total) by mouth at bedtime. 90 tablet 3   Multiple Vitamins-Minerals (ADVANCED DIABETIC MULTIVITAMIN) TABS Take 1 tablet by mouth daily.     mupirocin ointment (BACTROBAN) 2 % Apply 1 Application topically daily.     sildenafil (VIAGRA) 100 MG tablet Take 100 mg by mouth daily as needed.     triamterene -hydrochlorothiazide  (DYAZIDE ) 37.5-25 MG capsule Take 1 capsule by mouth daily.     venlafaxine  XR (EFFEXOR -XR) 75 MG 24 hr capsule Take 75 mg by mouth daily with breakfast.     vitamin B-12 (CYANOCOBALAMIN ) 500 MCG tablet Take 1,000 mcg by mouth daily.     No current facility-administered medications on file prior to visit.    There are no Patient Instructions on file for this visit. No follow-ups on file.   Cadyn Fann E Curt [place], NP

## 2023-10-21 ENCOUNTER — Encounter: Payer: Self-pay | Admitting: Physician Assistant

## 2023-10-21 ENCOUNTER — Ambulatory Visit: Attending: Physician Assistant | Admitting: Physician Assistant

## 2023-10-21 VITALS — BP 100/62 | HR 67 | Ht 74.0 in | Wt 267.0 lb

## 2023-10-21 DIAGNOSIS — I5032 Chronic diastolic (congestive) heart failure: Secondary | ICD-10-CM | POA: Diagnosis not present

## 2023-10-21 DIAGNOSIS — I10 Essential (primary) hypertension: Secondary | ICD-10-CM | POA: Diagnosis not present

## 2023-10-21 DIAGNOSIS — E785 Hyperlipidemia, unspecified: Secondary | ICD-10-CM

## 2023-10-21 DIAGNOSIS — N289 Disorder of kidney and ureter, unspecified: Secondary | ICD-10-CM

## 2023-10-21 DIAGNOSIS — I4821 Permanent atrial fibrillation: Secondary | ICD-10-CM

## 2023-10-21 DIAGNOSIS — I739 Peripheral vascular disease, unspecified: Secondary | ICD-10-CM

## 2023-10-21 LAB — VAS US ABI WITH/WO TBI
Left ABI: 1.36
Right ABI: 1.36

## 2023-10-21 NOTE — Patient Instructions (Signed)
 Medication Instructions:  Your physician recommends that you continue on your current medications as directed. Please refer to the Current Medication list given to you today.   *If you need a refill on your cardiac medications before your next appointment, please call your pharmacy*  Lab Work: None ordered at this time   Follow-Up: At [facility], you and your health needs are our priority.  As part of our continuing mission to provide you with exceptional heart care, our providers are all part of one team.  This team includes your primary Cardiologist (physician) and Advanced Practice Providers or APPs (Physician Assistants and Nurse Practitioners) who all work together to provide you with the care you need, when you need it.  Your next appointment:   12 month(s)  Provider:   You may see Timothy Gollan, MD or Varney Gentleman, PA-C

## 2023-12-14 ENCOUNTER — Other Ambulatory Visit: Payer: Self-pay | Admitting: Internal Medicine

## 2023-12-14 DIAGNOSIS — M4807 Spinal stenosis, lumbosacral region: Secondary | ICD-10-CM

## 2023-12-22 ENCOUNTER — Other Ambulatory Visit: Payer: Self-pay | Admitting: Family Medicine

## 2023-12-22 ENCOUNTER — Inpatient Hospital Stay
Admission: RE | Admit: 2023-12-22 | Discharge: 2023-12-22 | Disposition: A | Discharge status: Home / Self Care | Payer: Self-pay | Source: Ambulatory Visit | Attending: Orthopedic Surgery | Admitting: Orthopedic Surgery

## 2023-12-22 DIAGNOSIS — Z049 Encounter for examination and observation for unspecified reason: Secondary | ICD-10-CM

## 2023-12-29 ENCOUNTER — Ambulatory Visit
Admission: RE | Admit: 2023-12-29 | Discharge: 2023-12-29 | Disposition: A | Discharge status: Home / Self Care | Source: Ambulatory Visit | Attending: Internal Medicine | Admitting: Internal Medicine

## 2023-12-29 DIAGNOSIS — M4807 Spinal stenosis, lumbosacral region: Secondary | ICD-10-CM | POA: Insufficient documentation

## 2023-12-29 DIAGNOSIS — D3502 Benign neoplasm of left adrenal gland: Secondary | ICD-10-CM

## 2023-12-29 HISTORY — DX: Benign neoplasm of left adrenal gland: D35.02

## 2023-12-30 ENCOUNTER — Encounter: Payer: Self-pay | Admitting: Orthopedic Surgery

## 2023-12-30 NOTE — Progress Notes (Unsigned)
 Referring Physician:  Cleotilde Oneil FALCON, MD [address] [facility] [place],  [place] [address]  Primary Physician:  Cleotilde Oneil FALCON, MD  History of Present Illness: 12/31/2023 [name] has a history of afib/aflutter, HTN, CHF, PAD, DM with neuropathy, hyperlipidemia, obesity, CKD, parkinson's.    He sees rheumatology for inflammatory arthritis.   History of lumbar laminectomy in 1993. He did well after this surgery.   He has intermittent LBP with weakness in both legs with standing and walking. He feels unsteady when walking- he is easily fatigues. He has known neuropathy with numbness in his feet. He has no pain with sitting. He does better with grocery cart.   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   He is on PLAVIX .    Tobacco use: Does not smoke.   Bowel/Bladder Dysfunction: none  Conservative measures:  Physical therapy: has not participated in (2024 PT after toe amputation) Multimodal medical therapy including regular antiinflammatories: nothing Injections:  no epidural steroid injections  Past Surgery:  Lumbar laminectomy in 1993 L5/S1   Glendell Tiley has no symptoms of cervical myelopathy.  The symptoms are causing a significant impact on the patient's life.   Review of Systems:  A 10 point review of systems is negative, except for the pertinent positives and negatives detailed in the HPI.  Past Medical History: Past Medical History:  Diagnosis Date   Anxiety    Arrhythmia    atrial fibrillation   Arthritis    CHF (congestive heart failure) (HCC)    Diabetes mellitus without complication (HCC)    type 2   Dysrhythmia    PVC/ a fib   Heart murmur    High cholesterol    Hypertension     Past Surgical History: Past Surgical History:  Procedure Laterality Date   AMPUTATION TOE Left 02/10/2023   Procedure: Left Hallux Amputation;  Surgeon: Neill Boas, DPM;  Location: [facility] ORS;  Service:  Orthopedics/Podiatry;  Laterality: Left;   BACK SURGERY     l5/s1   COLONOSCOPY     KNEE ARTHROPLASTY Right 01/02/2020   Procedure: COMPUTER ASSISTED TOTAL KNEE ARTHROPLASTY;  Surgeon: Mardee Lynwood SQUIBB, MD;  Location: [facility] ORS;  Service: Orthopedics;  Laterality: Right;   KNEE ARTHROPLASTY Left 11/19/2020   Procedure: COMPUTER ASSISTED TOTAL KNEE ARTHROPLASTY;  Surgeon: Mardee Lynwood SQUIBB, MD;  Location: [facility] OR[facility];  Service: Orthopedics;  Laterality: Left;   LOWER EXTREMITY ANGIOGRAPHY Left 02/10/2023   Procedure: Lower Extremity Angiography;  Surgeon: Jama Cordella MATSU, MD;  Location: [facility];  Service: Cardiovascular;  Laterality: Left;   ORIF PATELLA Left 04/16/2021   Procedure: OPEN REDUCTION INTERNAL (ORIF) FIXATION PATELLA;  Surgeon: Mardee Lynwood SQUIBB, MD;  Location: [facility] OR[facility];  Service: Orthopedics;  Laterality: Left;   ORIF PATELLA Left 05/10/2021   Procedure: OPEN REDUCTION INTERNAL (ORIF) FIXATION PATELLA;  Surgeon: Mardee Lynwood SQUIBB, MD;  Location: [facility];  Service: Orthopedics;  Laterality: Left;    Allergies: Allergies as of 12/31/2023 - Review Complete 12/31/2023  Allergen Reaction Noted   Gabapentin  Other (See Comments) 01/21/2022    Medications: Outpatient Encounter Medications as of 12/31/2023  Medication Sig   carbidopa-levodopa (SINEMET IR) 25-100 MG tablet Take 1 tablet by mouth 2 (two) times daily.   clopidogrel  (PLAVIX ) 75 MG tablet Take 1 tablet (75 mg total) by mouth daily with breakfast.   Continuous Blood Gluc Sensor (FREESTYLE LIBRE 14 DAY SENSOR) MISC SMARTSIG:1 Kit(s) Topical Every 2 Weeks  diltiazem  (CARDIZEM  CD) 300 MG 24 hr capsule Take 1 capsule (300 mg total) by mouth at bedtime.   ELIQUIS  5 MG TABS tablet Take 1 tablet (5 mg total) by mouth 2 (two) times daily.   escitalopram (LEXAPRO) 10 MG tablet Take 10 mg by mouth daily.   fenofibrate  160 MG tablet Take 160 mg by mouth daily.    fluticasone  (FLONASE ) 50 MCG/ACT nasal spray Place 1 spray  into the nose daily as needed for allergies.   glipiZIDE  (GLUCOTROL ) 5 MG tablet Take 5 mg by mouth 2 (two) times daily.   hydroxychloroquine  (PLAQUENIL ) 200 MG tablet Take 200 mg by mouth 2 (two) times daily.   insulin  NPH-regular Human (HUMULIN  70/30) (70-30) 100 UNIT/ML injection Inject 25-50 Units into the skin See admin instructions. Inject 50 units into the skin in the morning and 30 units in the evening   lovastatin  (MEVACOR ) 40 MG tablet Take 1 tablet (40 mg total) by mouth daily with supper.   memantine (NAMENDA) 5 MG tablet Take 5 mg by mouth 2 (two) times daily.   metFORMIN  (GLUCOPHAGE ) 1000 MG tablet Take 1,000 mg by mouth 2 (two) times daily with a meal.    metoprolol  succinate (TOPROL -XL) 100 MG 24 hr tablet Take 1 tablet (100 mg total) by mouth at bedtime.   Multiple Vitamins-Minerals (ADVANCED DIABETIC MULTIVITAMIN) TABS Take 1 tablet by mouth daily.   sildenafil (VIAGRA) 100 MG tablet Take 100 mg by mouth daily as needed.   triamterene -hydrochlorothiazide  (DYAZIDE ) 37.5-25 MG capsule Take 1 capsule by mouth as needed (per patient).   venlafaxine  XR (EFFEXOR -XR) 75 MG 24 hr capsule Take 75 mg by mouth daily with breakfast.   vitamin B-12 (CYANOCOBALAMIN ) 500 MCG tablet Take 1,000 mcg by mouth daily.   [DISCONTINUED] doxycycline  (VIBRAMYCIN ) 100 MG capsule Take 1 capsule (100 mg total) by mouth 2 (two) times daily.   [DISCONTINUED] furosemide  (LASIX ) 20 MG tablet Take 1 tablet (20 mg total) by mouth daily. (Patient not taking: Reported on 10/21/2023)   [DISCONTINUED] mupirocin ointment (BACTROBAN) 2 % Apply 1 Application topically daily.   No facility-administered encounter medications on file as of 12/31/2023.    Social History: Social History   Tobacco Use   Smoking status: Former    Current packs/day: 0.00    Types: Cigarettes    Quit date: 1980    Years since quitting: 45.7   Smokeless tobacco: Never   Tobacco comments:    1980  Vaping Use   Vaping status: Never Used   Substance Use Topics   Alcohol use: Yes    Comment: rarely   Drug use: Never    Family Medical History: Family History  Problem Relation Age of Onset   Dementia Mother    Heart attack Father    Emphysema Father    Heart attack Brother     Physical Examination: Vitals:   12/31/23 0901  BP: 122/60    General: Patient is well developed, well nourished, calm, collected, and in no apparent distress. Attention to examination is appropriate.  Respiratory: Patient is breathing without any difficulty.   NEUROLOGICAL:     Awake, alert, oriented to person, place, and time.  Speech is clear and fluent. Fund of knowledge is appropriate.   Cranial Nerves: Pupils equal round and reactive to light.  Facial tone is symmetric.    No abnormal lesions on exposed skin.   Strength: Side Biceps Triceps Deltoid Interossei Grip Wrist Ext. Wrist Flex.  R 5 5 5 5  5  5 5  L 5 5 5 5 5 5 5    Side Iliopsoas Quads Hamstring PF DF EHL  R 5 5 5 5 5 5   L 5 5 5 5 5 5    Reflexes are 2+ and symmetric at the biceps, brachioradialis, patella and achilles.   Hoffman's is absent.  Clonus is not present.   Bilateral upper and lower extremity sensation is intact to light touch.     No pain with IR/ER of both hips.   Gait is slow.    Medical Decision Making  Imaging: Lumbar xrays dated 12/14/23:  FINDINGS: Regional soft tissues unremarkable. No aggressive osseus lesion. Vertebral body heights are intact. Multilevel disc height loss.  This is greatest at L5-S1 where it is moderate to severe.  Associated disc calcification and anterior osteophyte formation throughout the lumbar spine. There is straightening of the normal lordosis, which may be positional.  Exam End: 12/14/23 09:42 Last Resulted: 12/15/23 06:17  Received From: [facility]  Result Received: 12/18/23 10:44    MRI of lumbar spine dated 12/29/23:  FINDINGS: Segmentation:  Standard.   Alignment:  Normal.    Vertebrae:  No fracture, evidence of discitis, or bone lesion.   Conus medullaris and cauda equina: Conus extends to the L1 level. Conus and cauda equina appear normal.   Paraspinal and other soft tissues: The patient has a mass in the left adrenal gland measuring 4.0 x 3.7 cm the lesion is T2 hypointense and intermediate signal intensity on T1 weighted images. Otherwise negative.   Disc levels:   T12-L1: Mild facet degenerative change.  Otherwise negative.   L1-2: Negative.   L2-3: Minimal disc bulge.  No stenosis.   L3-4: Mild disc bulge and facet degenerative disease. There is mild central canal narrowing. The foramina are open.   L4-5: Broad-based central protrusion and ligamentum flavum thickening cause moderate to moderately severe central canal stenosis and narrowing of both lateral recesses. The foramina are open.   L5-S1: Shallow disc bulge and endplate spur without stenosis.   IMPRESSION: 1. 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 Recommend adrenal protocol MRI with and without contrast for further evaluation. 2. 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     Electronically Signed   By: Debby Prader M.D.   On: 12/30/2023 11:13     I have personally reviewed the images and agree with the above interpretation.  Assessment and Plan: Mr. Arviso has a history of lumbar laminectomy in 1993. He did well after this surgery.   He has intermittent LBP with weakness in both legs with standing and walking. Symptoms in legs are his primary complaint. He has known neuropathy with numbness in his feet. He has no pain with sitting. He does better with grocery cart.   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   MRI also with left adrenal mass.  Treatment options discussed with patient and  following plan made:   - PT for lumbar spine. Orders in to [facility] in [place].  - Discussed injections for lumbar spine. He declines.  - If no improvement with PT, he will need to follow up with surgeon to discuss possible surgery options.  - HgbA1c would need to be below 7.5 prior to any surgery and he would need cardiac clearance.  - I will message him in 4-5 weeks to  check on his progress with PT.   I called him after his visit to discuss adrenal mass seen on MRI. Will defer further workup to Dr. Cleotilde. Message sent to him.   I spent a total of 35 minutes in face-to-face and non-face-to-face activities related to this patient's care today including review of outside records, review of imaging, review of symptoms, physical exam, discussion of differential diagnosis, discussion of treatment options, and documentation.   Thank you for involving me in the care of this patient.   Glade Boys PA-C Dept. of Neurosurgery

## 2023-12-31 ENCOUNTER — Ambulatory Visit: Admitting: Orthopedic Surgery

## 2023-12-31 ENCOUNTER — Encounter: Payer: Self-pay | Admitting: Orthopedic Surgery

## 2023-12-31 VITALS — BP 122/60 | Ht 73.0 in | Wt 270.2 lb

## 2023-12-31 DIAGNOSIS — M48061 Spinal stenosis, lumbar region without neurogenic claudication: Secondary | ICD-10-CM | POA: Diagnosis not present

## 2023-12-31 DIAGNOSIS — M48062 Spinal stenosis, lumbar region with neurogenic claudication: Secondary | ICD-10-CM

## 2023-12-31 DIAGNOSIS — M47816 Spondylosis without myelopathy or radiculopathy, lumbar region: Secondary | ICD-10-CM

## 2023-12-31 NOTE — Patient Instructions (Signed)
 It was so nice to see you today. Thank you so much for coming in.    You have arthritis in your back along with spinal stenosis (pressure on spinal cord). This is likely causing the symptoms in your legs.   I sent physical therapy orders to [facility] in [place]. You need to call them at number below to schedule your visit.   If no improvement with PT, you will need to see one of the surgeons to discuss further options. I will message you in 4-5 weeks to check on your progress.   Your HgbA1c would need to be below 7.5 for surgery. We likely will need clearance from your cardiologist as well.   Please do not hesitate to call if you have any questions or concerns. You can also message me in MyChart.   Glade Boys PA-C [phone]     The physicians and staff at [facility] at [facility] are committed to providing excellent care. You may receive a survey asking for feedback about your experience at our office. We value you your feedback and appreciate you taking the time to to fill it out. The [facility] leadership team is also available to discuss your experience in person, feel free to contact us  [phone].

## 2024-01-05 ENCOUNTER — Ambulatory Visit: Attending: Orthopedic Surgery | Admitting: Physical Therapy

## 2024-01-05 ENCOUNTER — Encounter: Payer: Self-pay | Admitting: Physical Therapy

## 2024-01-05 ENCOUNTER — Other Ambulatory Visit: Payer: Self-pay | Admitting: Internal Medicine

## 2024-01-05 DIAGNOSIS — M6281 Muscle weakness (generalized): Secondary | ICD-10-CM | POA: Diagnosis present

## 2024-01-05 DIAGNOSIS — M47816 Spondylosis without myelopathy or radiculopathy, lumbar region: Secondary | ICD-10-CM | POA: Insufficient documentation

## 2024-01-05 DIAGNOSIS — R262 Difficulty in walking, not elsewhere classified: Secondary | ICD-10-CM | POA: Diagnosis present

## 2024-01-05 DIAGNOSIS — M48062 Spinal stenosis, lumbar region with neurogenic claudication: Secondary | ICD-10-CM | POA: Diagnosis present

## 2024-01-05 DIAGNOSIS — E278 Other specified disorders of adrenal gland: Secondary | ICD-10-CM

## 2024-01-05 NOTE — Therapy (Unsigned)
 OUTPATIENT PHYSICAL THERAPY THORACOLUMBAR EVALUATION   Patient Name: Kristopher Oneal MRN: [id number] DOB:1950-10-07, 73 y.o., male Today's Date: 01/05/2024  END OF SESSION:  PT End of Session - 01/05/24 0737     Visit Number 1    Number of Visits 13    Date for PT Re-Evaluation 02/18/24    PT Start Time 0737    PT Stop Time 0817    PT Time Calculation (min) 40 min    Behavior During Therapy Kristopher Oneal for tasks assessed/performed          Past Medical History:  Diagnosis Date   Anxiety    Arrhythmia    atrial fibrillation   Arthritis    CHF (congestive heart failure) (HCC)    Diabetes mellitus without complication (HCC)    type 2   Dysrhythmia    PVC/ a fib   Heart murmur    High cholesterol    Hypertension    Past Surgical History:  Procedure Laterality Date   AMPUTATION TOE Left 02/10/2023   Procedure: Left Hallux Amputation;  Surgeon: Neill Boas, DPM;  Location: [facility];  Service: Orthopedics/Podiatry;  Laterality: Left;   BACK SURGERY     l5/s1   COLONOSCOPY     KNEE ARTHROPLASTY Right 01/02/2020   Procedure: COMPUTER ASSISTED TOTAL KNEE ARTHROPLASTY;  Surgeon: Mardee Lynwood SQUIBB, MD;  Location: [facility];  Service: Orthopedics;  Laterality: Right;   KNEE ARTHROPLASTY Left 11/19/2020   Procedure: COMPUTER ASSISTED TOTAL KNEE ARTHROPLASTY;  Surgeon: Mardee Lynwood SQUIBB, MD;  Location: [facility];  Service: Orthopedics;  Laterality: Left;   LOWER EXTREMITY ANGIOGRAPHY Left 02/10/2023   Procedure: Lower Extremity Angiography;  Surgeon: Jama Cordella MATSU, MD;  Location: [facility];  Service: Cardiovascular;  Laterality: Left;   ORIF PATELLA Left 04/16/2021   Procedure: OPEN REDUCTION INTERNAL (ORIF) FIXATION PATELLA;  Surgeon: Mardee Lynwood SQUIBB, MD;  Location: [facility];  Service: Orthopedics;  Laterality: Left;   ORIF PATELLA Left 05/10/2021   Procedure: OPEN REDUCTION INTERNAL (ORIF) FIXATION PATELLA;  Surgeon: Mardee Lynwood SQUIBB, MD;  Location: [facility];  Service:  Orthopedics;  Laterality: Left;   Patient Active Problem List   Diagnosis Date Noted   PAD (peripheral artery disease) (HCC) 02/11/2023   Amputation of great toe (HCC) 02/11/2023   Foot osteomyelitis, left (HCC) 02/05/2023   Inflammatory arthritis 02/05/2023   Left arm swelling 02/05/2023   Hypokalemia 12/03/2021   Acute CHF (HCC) 12/02/2021   Chronic atrial fibrillation (HCC) 12/02/2021   HTN (hypertension) 12/02/2021   HLD (hyperlipidemia) 12/02/2021   Diabetes mellitus without complication (HCC) 12/02/2021   Leukocytosis 12/02/2021   Obesity with body mass index (BMI) of 30.0 to 39.9 12/02/2021   Hypomagnesemia 12/02/2021   S/P ORIF (open reduction internal fixation) fracture 05/10/2021   Patella fracture 04/16/2021   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/19/2020   Status post total right knee replacement 01/18/2020   Atrial fibrillation and flutter (HCC) 07/09/2018   DM type 2 with diabetic mixed hyperlipidemia (HCC) 02/23/2018   B12 deficiency 07/17/2016   Benign essential hypertension 07/17/2016   Chronic painful diabetic neuropathy (HCC) 07/17/2016   Hyperlipidemia, mixed 07/17/2016   Symptomatic PVCs 07/17/2016    PCP: Cleotilde Oneil FALCON, MD  REFERRING PROVIDER: Hilma Hastings, PA-C  REFERRING DIAG:  [phone] (ICD-10-CM) - Spinal stenosis of lumbar region with neurogenic claudication  M47.816 (ICD-10-CM) - Lumbar spondylosis    RATIONALE FOR EVALUATION AND TREATMENT: Rehabilitation  THERAPY DIAG: Spinal stenosis of lumbar region with  neurogenic claudication  Difficulty in walking, not elsewhere classified  Muscle weakness (generalized)  ONSET DATE: Since Jan 2023 during which pt had L TKA completed   FOLLOW-UP APPT SCHEDULED WITH REFERRING PROVIDER: Yes ; pt is awaiting L-spine MRI and further f/u with Glade Boys, PA-C   SUBJECTIVE:                                                                                                                                                                                          SUBJECTIVE STATEMENT:  Pt is a 73 year old male referred for lumbar spinal stenosis/lumbar sponylosis.  PERTINENT HISTORY: Pt reports primary c/o weakness in his legs. Pt had previous discectomy at L5-S1 in 1993. Weakness in legs is associated with neurogenic claudication per patient. Leg weakness resolves with sitting. Patient reports notable fatigue in back versus pain.   Mr. Birt Reinoso has a history of afib/aflutter, HTN, CHF, PAD, DM with neuropathy, hyperlipidemia, obesity, CKD, parkinson's. He sees rheumatology for inflammatory arthritis; pt denies Hx of RA. History of lumbar laminectomy in 1993. He did well after this surgery.    He has intermittent LBP with weakness in both legs with standing and walking. He feels unsteady when walking- he is easily fatigues. He has known neuropathy with numbness in his feet. Hx of L great toe amputation without any prosthesis donned at this time. He has no pain with sitting. He does better with grocery cart.    History of bilateral TKA - had complications with left one; patellar component had to be revised. Pt reports having ongoing issues with L knee, but not so much with RLE. He reports having difficulty with getting unsteady on his feet. Pt reports notable fatigue affecting bilateral hamstrings and down to ankles. Pt occasionally uses cane/walking stick. Pt has to lean forward when he walks   PAIN:    Pt states pain is not primary problem Numbness/Tingling: Yes; bilateral feet Focal Weakness: Yes; hamstrings/calf muscles Aggravating factors: standing, walking, exertion (e.g. making bed) Relieving factors: sitting down, leaning onto cart/pitching trunk forward 24-hour pain behavior: None How long can you stand: Pt can walk up to 150 yards in his park History of prior back injury, pain, surgery, or therapy: Yes; previous microdiscectomy; pt had PT for back many years ago at [place]  [facility] Imaging: Yes ; L-spine MRI 12/29/23  IMPRESSION: 1. 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 Recommend adrenal protocol MRI with and without contrast for further evaluation. 2. 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  Red flags: Negative for bowel/bladder changes, saddle paresthesia, personal history of cancer, h/o spinal tumors, h/o compression fx, h/o abdominal aneurysm, abdominal pain, chills/fever, night sweats, nausea, vomiting, unrelenting pain, first onset of insidious LBP <20 y/o  -increased urinary frequency, has abated somewhat - with diuretic  PRECAUTIONS: None  WEIGHT BEARING RESTRICTIONS: No  FALLS: Has patient fallen in last 6 months? No  -one fall > 6 months ago; heel caught step above him while pt was carrying bags  Living Environment Lives with: lives with their spouse, spouse is working during the day; pt is semi-retired Lives in: Mobile home, pt takes golf cart to bring trash down to [employer]: Yes: External: 3 steps; can reach both Has following equipment at home: Single point cane  Prior level of function: Independent  Occupational demands: Semi-retired; part-time IT work  Hobbies: Able to return to golfing   Patient Goals: Will PT help with numbness in legs/pain    OBJECTIVE:  Patient Surveys  Modified Oswestry:  MODIFIED OSWESTRY DISABILITY SCALE  Date: 01/05/24 Score  Pain intensity 1 = The pain is bad, but I can manage without having to take (1) I can stand as long as I want but, it increases my pain. pain medication.  2. Personal care (washing, dressing, etc.) 0 =  I can take care of myself normally without causing increased pain.  3. Lifting 4 = I can lift only very light weights  4. Walking 2 =  Pain prevents me from walking more than  mile.  5. Sitting 0 =  I can sit in any chair as long as  I like.  6. Standing 4 =  Pain prevents me from standing more than 10 minutes.  7. Sleeping 0 = Pain does not prevent me from sleeping well.  8. Social Life 2 = Pain prevents me from participating in more energetic activities (eg. sports, dancing).  9. Traveling 0 =  I can travel anywhere without increased pain.  10. Employment/ Homemaking 0 = My normal homemaking/job activities do not cause pain.  Total 13/50 = 26%    Cognition Patient is oriented to person, place, and time.  Recent memory is intact.  Remote memory is intact.  Attention span and concentration are intact.  Expressive speech is intact.  Patient's fund of knowledge is within normal limits for educational level.    Gross Musculoskeletal Assessment Tremor: None Bulk: Normal Tone: Normal No visible step-off along spinal column, no signs of scoliosis  GAIT: Distance walked: 40 ft  Assistive device utilized: {Assistive devices:23999} Level of assistance: {Levels of assistance:24026} Comments: Dec arm swing, shortened stride length, L antalgic pattern with dec stance time  Posture: Lumbar lordosis: Decreased Iliac crest height: Equal bilaterally Lumbar lateral shift: Negative  AROM AROM (Normal range in degrees) AROM  01/05/24  Lumbar   Flexion (65) 75% (tight in hamstrings)  Extension (30) 25%  Right lateral flexion (25) 75%  Left lateral flexion (25) 75%  Right rotation (30) WNL  Left rotation (30) 75% (tightness in L flank region)      Hip Right Left  Flexion (125)    Extension (15)    Abduction (40)    Adduction     Internal Rotation (45)    External Rotation (45)        Knee    Flexion (135)    Extension (0)        Ankle    Dorsiflexion (20)    Plantarflexion (50)    Inversion (  35)    Eversion (15)    (* = pain; Blank rows = not tested)  LE MMT: MMT (out of 5) Right 01/05/24 Left 01/05/24  Hip flexion 4- 3+  Hip extension    Hip abduction 5 seated 5 seated  Hip adduction 4+ 4+  Hip  internal rotation    Hip external rotation    Knee flexion 5 5-  Knee extension 4+ 4+  Ankle dorsiflexion 4 4  Ankle plantarflexion    Ankle inversion    Ankle eversion    (* = pain; Blank rows = not tested)  Sensation Decreased light touch sensation in stocking glove pattern in feet. Proprioception, stereognosis, and hot/cold testing deferred on this date.  Reflexes R/L Difficulty obtaining Knee jerk and Achilles reflexes   Muscle Length Hamstrings: R: Positive L: Positive E[place] (quadriceps): R: Not examined L: Not examined Thomas (hip flexors): R: Not examined L: Not examined   Palpation Not examined - no c/o pain Location Right Left         Lumbar paraspinals    Quadratus Lumborum    Gluteus Maximus    Gluteus Medius    Deep hip external rotators    PSIS    Fortin's Area (SIJ)    Greater Trochanter    (Blank rows = not tested) Graded on 0-4 scale (0 = no pain, 1 = pain, 2 = pain with wincing/grimacing/flinching, 3 = pain with withdrawal, 4 = unwilling to allow palpation)   Special Tests Lumbar Radiculopathy and Discogenic: Centralization and Peripheralization (SN 92, -LR 0.12): No notable symptoms in lying following repeated flexion in lying/double knee to chest Slump (SN 83, -LR 0.32): R: Negative L: Negative SLR (SN 92, -LR 0.29): R: Negative L:  Negative   Facet Joint: Extension-Rotation (SN 100, -LR 0.0): R: Negative L: Negative  Lumbar Foraminal Stenosis: Lumbar quadrant (SN 70): R: Negative L: Negative  Hip: FABER (SN 81): R: Not examined L: Not examined     TODAY'S TREATMENT: DATE: 01/05/2024    Therapeutic Exercise - for HEP establishment, discussion on appropriate exercise/activity modification, PT education   Reviewed baseline home exercises and provided handout for [facility] program (see Access Code); tactile cueing and therapist demonstration utilized as needed for carryover of proper technique to HEP.    Patient education on current  condition, anatomy involved, prognosis, plan of care. Discussion on activity modification to prevent flare-up of condition, including .    PATIENT EDUCATION:  Education details: see above for patient education details Person educated: {Person educated:25204} Education method: {Education Method:25205} Education comprehension: {Education Comprehension:25206}   HOME EXERCISE PROGRAM:  Access Code: [email or web address] URL: [email or web address] Date: 01/05/2024 Prepared by: Venetia Endo  Exercises - Supine Double Knee to Chest  - 3 x daily - 7 x weekly - 2 sets - 10 reps - 1-2sec hold - Seated Lumbar Flexion Stretch  - 3 x daily - 7 x weekly - 2 sets - 10 reps - 1-2sec hold - Seated Hamstring Stretch  - 2 x daily - 7 x weekly - 3 sets - 30sec hold   ASSESSMENT:  CLINICAL IMPRESSION: Patient is a 73 y.o. male who was seen today for physical therapy evaluation and treatment for lumbar spinal stenosis with neurogenic claudication.   OBJECTIVE IMPAIRMENTS: {opptimpairments:25111}.   ACTIVITY LIMITATIONS: {activitylimitations:27494}  PARTICIPATION LIMITATIONS: {participationrestrictions:25113}  PERSONAL FACTORS: {Personal factors:25162} are also affecting patient's functional outcome.   REHAB POTENTIAL: {rehabpotential:25112}  CLINICAL DECISION MAKING: {clinical decision making:25114}  EVALUATION COMPLEXITY: {Evaluation complexity:25115}  GOALS: Goals reviewed with patient? No  SHORT TERM GOALS: Target date: 01/28/2024  Pt will be independent with HEP in order to improve strength and decrease back pain to improve pain-free function at home and work. Baseline: 01/05/24: Baseline HEP initiated.  Goal status: INITIAL   LONG TERM GOALS: Target date: 02/18/2024  Pt will  Baseline:  Goal status: INITIAL  2.  Pt will  Baseline: 01/05/24:  Goal status: INITIAL  3.  Pt will decrease mODI score by at least 13 points in order demonstrate clinically significant reduction  in back pain/disability.       Baseline: 01/05/24: 13/50 = 26% Goal status: INITIAL  4.  *** Baseline: *** Goal status: INITIAL   PLAN: PT FREQUENCY: 1-2x/week  PT DURATION: 6 weeks  PLANNED INTERVENTIONS: Therapeutic exercises, Therapeutic activity, Neuromuscular re-education, Balance training, Gait training, Patient/Family education, Self Care, Joint mobilization, Joint manipulation, Vestibular training, Canalith repositioning, Orthotic/Fit training, DME instructions, Dry Needling, Electrical stimulation, Spinal manipulation, Spinal mobilization, Cryotherapy, Moist heat, Taping, Traction, Ultrasound, Ionotophoresis 4mg /ml Dexamethasone , Manual therapy, and Re-evaluation.  PLAN FOR NEXT SESSION: Check hip extensor and abductor strength,    Venetia Endo, PT, DPT #[id number]  Venetia ONEIDA Endo, PT 01/05/2024, 7:47 AM

## 2024-01-07 ENCOUNTER — Encounter: Payer: Self-pay | Admitting: Physical Therapy

## 2024-01-07 ENCOUNTER — Ambulatory Visit: Admitting: Physical Therapy

## 2024-01-07 DIAGNOSIS — M6281 Muscle weakness (generalized): Secondary | ICD-10-CM

## 2024-01-07 DIAGNOSIS — M48062 Spinal stenosis, lumbar region with neurogenic claudication: Secondary | ICD-10-CM

## 2024-01-07 DIAGNOSIS — R262 Difficulty in walking, not elsewhere classified: Secondary | ICD-10-CM

## 2024-01-07 DIAGNOSIS — M519 Unspecified thoracic, thoracolumbar and lumbosacral intervertebral disc disorder: Secondary | ICD-10-CM | POA: Insufficient documentation

## 2024-01-07 NOTE — Therapy (Signed)
 OUTPATIENT PHYSICAL THERAPY TREATMENT   Patient Name: Kristopher Oneal MRN: [id number] DOB:12-05-1950, 73 y.o., male Today's Date: 01/07/2024  END OF SESSION:  PT End of Session - 01/09/24 2112     Visit Number 2    Number of Visits 13    Date for Recertification  02/18/24    PT Start Time 0729    PT Stop Time 0813    PT Time Calculation (min) 44 min    Behavior During Therapy [facility] for tasks assessed/performed           Past Medical History:  Diagnosis Date   Anxiety    Arrhythmia    atrial fibrillation   Arthritis    CHF (congestive heart failure) (HCC)    Diabetes mellitus without complication (HCC)    type 2   Dysrhythmia    PVC/ a fib   Heart murmur    High cholesterol    Hypertension    Past Surgical History:  Procedure Laterality Date   AMPUTATION TOE Left 02/10/2023   Procedure: Left Hallux Amputation;  Surgeon: Neill Boas, DPM;  Location: [facility];  Service: Orthopedics/Podiatry;  Laterality: Left;   BACK SURGERY     l5/s1   COLONOSCOPY     KNEE ARTHROPLASTY Right 01/02/2020   Procedure: COMPUTER ASSISTED TOTAL KNEE ARTHROPLASTY;  Surgeon: Mardee Lynwood SQUIBB, MD;  Location: [facility];  Service: Orthopedics;  Laterality: Right;   KNEE ARTHROPLASTY Left 11/19/2020   Procedure: COMPUTER ASSISTED TOTAL KNEE ARTHROPLASTY;  Surgeon: Mardee Lynwood SQUIBB, MD;  Location: [facility];  Service: Orthopedics;  Laterality: Left;   LOWER EXTREMITY ANGIOGRAPHY Left 02/10/2023   Procedure: Lower Extremity Angiography;  Surgeon: Jama Cordella MATSU, MD;  Location: [facility];  Service: Cardiovascular;  Laterality: Left;   ORIF PATELLA Left 04/16/2021   Procedure: OPEN REDUCTION INTERNAL (ORIF) FIXATION PATELLA;  Surgeon: Mardee Lynwood SQUIBB, MD;  Location: [facility];  Service: Orthopedics;  Laterality: Left;   ORIF PATELLA Left 05/10/2021   Procedure: OPEN REDUCTION INTERNAL (ORIF) FIXATION PATELLA;  Surgeon: Mardee Lynwood SQUIBB, MD;  Location: [facility];  Service: Orthopedics;   Laterality: Left;   Patient Active Problem List   Diagnosis Date Noted   PAD (peripheral artery disease) (HCC) 02/11/2023   Amputation of great toe (HCC) 02/11/2023   Foot osteomyelitis, left (HCC) 02/05/2023   Inflammatory arthritis 02/05/2023   Left arm swelling 02/05/2023   Hypokalemia 12/03/2021   Acute CHF (HCC) 12/02/2021   Chronic atrial fibrillation (HCC) 12/02/2021   HTN (hypertension) 12/02/2021   HLD (hyperlipidemia) 12/02/2021   Diabetes mellitus without complication (HCC) 12/02/2021   Leukocytosis 12/02/2021   Obesity with body mass index (BMI) of 30.0 to 39.9 12/02/2021   Hypomagnesemia 12/02/2021   S/P ORIF (open reduction internal fixation) fracture 05/10/2021   Patella fracture 04/16/2021   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/19/2020   Status post total right knee replacement 01/18/2020   Atrial fibrillation and flutter (HCC) 07/09/2018   DM type 2 with diabetic mixed hyperlipidemia (HCC) 02/23/2018   B12 deficiency 07/17/2016   Benign essential hypertension 07/17/2016   Chronic painful diabetic neuropathy (HCC) 07/17/2016   Hyperlipidemia, mixed 07/17/2016   Symptomatic PVCs 07/17/2016    PCP: Cleotilde Oneil FALCON, MD  REFERRING PROVIDER: Hilma Hastings, PA-C  REFERRING DIAG:  [phone] (ICD-10-CM) - Spinal stenosis of lumbar region with neurogenic claudication  M47.816 (ICD-10-CM) - Lumbar spondylosis    RATIONALE FOR EVALUATION AND TREATMENT: Rehabilitation  THERAPY DIAG: Spinal stenosis of lumbar region with  neurogenic claudication  Difficulty in walking, not elsewhere classified  Muscle weakness (generalized)  ONSET DATE: Since Jan 2023 during which pt had L TKA completed   FOLLOW-UP APPT SCHEDULED WITH REFERRING PROVIDER: Yes ; pt is awaiting L-spine MRI and further f/u with Glade Boys, PA-C  PERTINENT HISTORY: Pt reports primary c/o weakness in his legs. Pt had previous discectomy at L5-S1 in 1993. Weakness in legs is associated with neurogenic  claudication per patient. Leg weakness resolves with sitting. Patient reports notable fatigue in back versus pain.   Kristopher Oneal has a history of afib/aflutter, HTN, CHF, PAD, DM with neuropathy, hyperlipidemia, obesity, CKD, parkinson's. He sees rheumatology for inflammatory arthritis; pt denies Hx of RA. History of lumbar laminectomy in 1993. He did well after this surgery.    He has intermittent LBP with weakness in both legs with standing and walking. He feels unsteady when walking- he is easily fatigues. He has known neuropathy with numbness in his feet. Hx of L great toe amputation without any prosthesis donned at this time. He has no pain with sitting. He does better with grocery cart.    History of bilateral TKA - had complications with left one; patellar component had to be revised. Pt reports having ongoing issues with L knee, but not so much with RLE. He reports having difficulty with getting unsteady on his feet. Pt reports notable fatigue affecting bilateral hamstrings and down to ankles. Pt occasionally uses cane/walking stick. Pt has to lean forward when he walks   PAIN:    Pt states pain is not primary problem Numbness/Tingling: Yes; bilateral feet Focal Weakness: Yes; hamstrings/calf muscles Aggravating factors: standing, walking, exertion (e.g. making bed) Relieving factors: sitting down, leaning onto cart/pitching trunk forward 24-hour pain behavior: None How long can you stand: Pt can walk up to 150 yards in his park History of prior back injury, pain, surgery, or therapy: Yes; previous microdiscectomy; pt had PT for back many years ago at [facility] Imaging: Yes ; L-spine MRI 12/29/23  IMPRESSION: 1. 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 Recommend adrenal protocol MRI with and without contrast for further evaluation. 2. Spondylosis appears worst at L4-5 where there is moderate to moderately severe central canal stenosis and  narrowing of both lateral recesses.   Red flags: Negative for bowel/bladder changes, saddle paresthesia, personal history of cancer, h/o spinal tumors, h/o compression fx, h/o abdominal aneurysm, abdominal pain, chills/fever, night sweats, nausea, vomiting, unrelenting pain, first onset of insidious LBP <18 y/o  -increased urinary frequency, has abated somewhat - with diuretic  PRECAUTIONS: None  WEIGHT BEARING RESTRICTIONS: No  FALLS: Has patient fallen in last 6 months? No  -one fall > 6 months ago; heel caught step above him while pt was carrying bags  Living Environment Lives with: lives with their spouse, spouse is working during the day; pt is semi-retired Lives in: Mobile home, pt takes golf cart to bring trash down to [employer]: Yes: External: 3 steps; can reach both Has following equipment at home: Single point cane  Prior level of function: Independent  Occupational demands: Semi-retired; part-time IT work  Hobbies: Able to return to golfing   Patient Goals: Will PT help with numbness in legs/pain    OBJECTIVE (data from initial evaluation unless otherwise dated):   Patient Surveys  Modified Oswestry:  MODIFIED OSWESTRY DISABILITY SCALE  Date: 01/05/24 Score  Pain intensity 1 = The pain is bad, but I can manage  without having to take (1) I can stand as long as I want but, it increases my pain. pain medication.  2. Personal care (washing, dressing, etc.) 0 =  I can take care of myself normally without causing increased pain.  3. Lifting 4 = I can lift only very light weights  4. Walking 2 =  Pain prevents me from walking more than  mile.  5. Sitting 0 =  I can sit in any chair as long as I like.  6. Standing 4 =  Pain prevents me from standing more than 10 minutes.  7. Sleeping 0 = Pain does not prevent me from sleeping well.  8. Social Life 2 = Pain prevents me from participating in more energetic activities (eg. sports, dancing).  9. Traveling 0 =  I  can travel anywhere without increased pain.  10. Employment/ Homemaking 0 = My normal homemaking/job activities do not cause pain.  Total 13/50 = 26%    GAIT: Distance walked: 40 ft  Assistive device utilized: None Level of assistance: SBA Comments: Dec arm swing, shortened stride length, L antalgic pattern with dec stance time  Posture: Lumbar lordosis: Decreased Iliac crest height: Equal bilaterally Lumbar lateral shift: Negative  AROM AROM (Normal range in degrees) AROM  01/05/24  Lumbar   Flexion (65) 75% (tight in hamstrings)  Extension (30) 25%  Right lateral flexion (25) 75%  Left lateral flexion (25) 75%  Right rotation (30) WNL  Left rotation (30) 75% (tightness in L flank region)      Hip Right Left  Flexion (125)    Extension (15)    Abduction (40)    Adduction     Internal Rotation (45)    External Rotation (45)        Knee    Flexion (135)    Extension (0)        Ankle    Dorsiflexion (20)    Plantarflexion (50)    Inversion (35)    Eversion (15)    (* = pain; Blank rows = not tested)  LE MMT: MMT (out of 5) Right 01/05/24 Left 01/05/24  Hip flexion 4- 3+  Hip extension (updated 01/07/24) 4- 4-  Hip abduction (updated 01/07/24) 5 seated 4- sidelying 5 seated 4- sidelying  Hip adduction 4+ 4+  Hip internal rotation    Hip external rotation    Knee flexion 5 5-  Knee extension 4+ 4+  Ankle dorsiflexion 4 4  Ankle plantarflexion    Ankle inversion    Ankle eversion    (* = pain; Blank rows = not tested)  Sensation Decreased light touch sensation in stocking glove pattern in feet. Proprioception, stereognosis, and hot/cold testing deferred on this date.  Muscle Length Hamstrings: R: Positive L: Positive Ely (quadriceps): R: Not examined L: Not examined Thomas (hip flexors): R: Not examined L: Not examined   Palpation Not examined - no c/o pain Location Right Left         Lumbar paraspinals    Quadratus Lumborum    Gluteus Maximus     Gluteus Medius    Deep hip external rotators    PSIS    Fortin's Area (SIJ)    Greater Trochanter    (Blank rows = not tested) Graded on 0-4 scale (0 = no pain, 1 = pain, 2 = pain with wincing/grimacing/flinching, 3 = pain with withdrawal, 4 = unwilling to allow palpation)   Special Tests Lumbar Radiculopathy and Discogenic: Centralization and Peripheralization (SN  92, -LR 0.12): No notable symptoms in lying following repeated flexion in lying/double knee to chest Slump (SN 83, -LR 0.32): R: Negative L: Negative SLR (SN 92, -LR 0.29): R: Negative L:  Negative   Facet Joint: Extension-Rotation (SN 100, -LR 0.0): R: Negative L: Negative  Lumbar Foraminal Stenosis: Lumbar quadrant (SN 70): R: Negative L: Negative  Hip: FABER (SN 81): R: Not examined L: Not examined     TODAY'S TREATMENT: DATE: 01/09/2024   SUBJECTIVE STATEMENT:   Patient reports doing pretty good in regard to leg symptoms during the week. Patient reports having access to gym setting through his insurance. Patient reports doing well with initial home exercises.    Therapeutic Exercise - for improved soft tissue flexibility and extensibility as needed for ROM, improved strength as needed to improve performance of CKC activities/functional movements   NuStep; Level 3, x 6 minutes - for improved soft tissue mobility and increased tissue temperature to improve muscle performance   -subjective gathered during this time  Standing HR/TR, light touch on // bars; 2 x 15 SLR; 1 x 10, 1 x 15 bilat  PATIENT EDUCATION: Discussed role of flexion to decrease neural compression and use of cardiovascular exercise equipment in gym to improve fatigability of BLE. HEP updated and new [employer] handout provided to facilitate robust HEP and transition to home-based PT over next 2 weeks.    Neuromuscular Re-education - for nervous system downregulation, gluteal musculature activation and exercises to promote LE kinetic chain  stability   Sidelying hip abduction; 2 x  15, bilat  Bridge; 2 x 10   Therapeutic Activities - patient education, repetitive task practice for improved performance of daily functional activities e.g. transferring  30-sec sit to stand: x 2 reps, stopped at 15 sec in  Sit to stand with armrest assist; 1 x 5  -reviewed for HEP to determine technique to allow best carryover for pt at home  Minisquat standing adjacent to treadmill armrest; 2 x 15     PATIENT EDUCATION:  Education details: see above for patient education details Person educated: Patient Education method: Explanation, Demonstration, and Handouts Education comprehension: verbalized understanding and returned demonstration   HOME EXERCISE PROGRAM:  Access Code: [email or web address] URL: [email or web address] Date: 01/07/2024 Prepared by: Venetia Endo  Exercises - Supine Double Knee to Chest  - 3 x daily - 7 x weekly - 2 sets - 10 reps - 1-2sec hold - Seated Lumbar Flexion Stretch  - 3 x daily - 7 x weekly - 2 sets - 10 reps - 1-2sec hold - Seated Hamstring Stretch  - 2 x daily - 7 x weekly - 3 sets - 30sec hold - Sit to Stand with Armchair  - 1 x daily - 7 x weekly - 2-3 sets - 5-10 reps - Supine Active Straight Leg Raise  - 1 x daily - 7 x weekly - 2-3 sets - 12-15 reps - Supine Bridge  - 1 x daily - 7 x weekly - 2-3 sets - 10-12 reps - 2sec hold - Heel Toe Raises with Unilateral Counter Support  - 1 x daily - 7 x weekly - 2-3 sets - 10 reps - 15 hold   ASSESSMENT:  CLINICAL IMPRESSION: Patient is a 73 y.o. male who was seen today for physical therapy treatment for lumbar spinal stenosis with neurogenic claudication. He has notable deconditioning and fatigability of lower extremities given limited capacity for ambulatory/standing activity with progressing neurogenic claudication. We worked on progression of HEP for specific strength  deficits and to improve deficit with sit to stand performance. We will work  toward tapering of PT visits over next 1-2 weeks to ensure limited number of visits to manage financial burden. Pt has current deficits in: LE strength, posterior chain flexibility/hamstrings length, decreased thoracolumbar AROM, decreased positional tolerance for standing/walking. Pt will continue to benefit from skilled PT services to address deficits and improve function.  OBJECTIVE IMPAIRMENTS: Abnormal gait, difficulty walking, decreased ROM, decreased strength, hypomobility, impaired flexibility, postural dysfunction, and pain.   ACTIVITY LIMITATIONS: carrying, lifting, standing, transfers, reach over head, and locomotion level  PARTICIPATION LIMITATIONS: meal prep, cleaning, shopping, community activity, and taking out trash in his neighborhood  PERSONAL FACTORS: Age, Time since onset of injury/illness/exacerbation, and 3+ comorbidities: (chronic neuropathy, PAD, HTN, A-fib, Hx of L great toe amputation, HLD) are also affecting patient's functional outcome.   REHAB POTENTIAL: Good  CLINICAL DECISION MAKING: Evolving/moderate complexity  EVALUATION COMPLEXITY: Moderate   GOALS: Goals reviewed with patient? No  SHORT TERM GOALS: Target date: 01/28/2024  Pt will be independent with HEP in order to improve strength and decrease back pain to improve pain-free function at home and work. Baseline: 01/05/24: Baseline HEP initiated.  Goal status: INITIAL   LONG TERM GOALS: Target date: 02/18/2024  Pt will have MMT 4+/5 for all tested LE musculature indicative of increased strength as needed for improved mm endurance and ability to complete prolonged weightbearing tasks Baseline:  Goal status: INITIAL  2.  Pt will complete standing/walking bouts up to 30 mins without claudication symptoms > 1-2/10 as needed for completing community outings, social outings, and errands. Baseline: 01/05/24: Difficulty walking 150 yards to get around neighborhood/park Goal status: INITIAL  3.  Pt will  decrease mODI score by at least 13 points in order demonstrate clinically significant reduction in back pain/disability.       Baseline: 01/05/24: 13/50 = 26% Goal status: INITIAL  4.  Pt will be compliant and independent with advanced HEP as needed for maintaining long-term level of function.  Baseline: 01/05/24: Baseline HEP initiated Goal status: INITIAL   PLAN: PT FREQUENCY: 1-2x/week  PT DURATION: 6 weeks  PLANNED INTERVENTIONS: Therapeutic exercises, Therapeutic activity, Neuromuscular re-education, Balance training, Gait training, Patient/Family education, Self Care, Joint mobilization, Joint manipulation, Vestibular training, Canalith repositioning, Orthotic/Fit training, DME instructions, Dry Needling, Electrical stimulation, Spinal manipulation, Spinal mobilization, Cryotherapy, Moist heat, Taping, Traction, Ultrasound, Ionotophoresis 4mg /ml Dexamethasone , Manual therapy, and Re-evaluation.  PLAN FOR NEXT SESSION: Continue with thoracolumbar mobility and progressive resistance drills with emphasis on endurance; progressive aerobic  exercise.    Venetia Endo, PT, DPT #[id number]  Venetia ONEIDA Endo, PT 01/09/2024, 9:12 PM

## 2024-01-11 ENCOUNTER — Other Ambulatory Visit: Payer: Self-pay | Admitting: Internal Medicine

## 2024-01-11 DIAGNOSIS — F0390 Unspecified dementia without behavioral disturbance: Secondary | ICD-10-CM

## 2024-01-11 DIAGNOSIS — R400 Somnolence: Secondary | ICD-10-CM

## 2024-01-12 ENCOUNTER — Ambulatory Visit: Admitting: Physical Therapy

## 2024-01-12 ENCOUNTER — Encounter: Payer: Self-pay | Admitting: Physical Therapy

## 2024-01-12 DIAGNOSIS — M48062 Spinal stenosis, lumbar region with neurogenic claudication: Secondary | ICD-10-CM

## 2024-01-12 DIAGNOSIS — M6281 Muscle weakness (generalized): Secondary | ICD-10-CM

## 2024-01-12 DIAGNOSIS — R262 Difficulty in walking, not elsewhere classified: Secondary | ICD-10-CM

## 2024-01-12 NOTE — Therapy (Signed)
 OUTPATIENT PHYSICAL THERAPY TREATMENT   Patient Name: Kristopher Oneal MRN: [id number] DOB:July 05, 1950, 73 y.o., male Today's Date: 01/07/2024  END OF SESSION:  PT End of Session - 01/12/24 0736     Visit Number 3    Number of Visits 13    Date for Recertification  02/18/24    PT Start Time 0736    PT Stop Time 0818    PT Time Calculation (min) 42 min    Behavior During Therapy [facility] for tasks assessed/performed           Past Medical History:  Diagnosis Date   Anxiety    Arrhythmia    atrial fibrillation   Arthritis    CHF (congestive heart failure) (HCC)    Diabetes mellitus without complication (HCC)    type 2   Dysrhythmia    PVC/ a fib   Heart murmur    High cholesterol    Hypertension    Past Surgical History:  Procedure Laterality Date   AMPUTATION TOE Left 02/10/2023   Procedure: Left Hallux Amputation;  Surgeon: Neill Boas, DPM;  Location: [facility];  Service: Orthopedics/Podiatry;  Laterality: Left;   BACK SURGERY     l5/s1   COLONOSCOPY     KNEE ARTHROPLASTY Right 01/02/2020   Procedure: COMPUTER ASSISTED TOTAL KNEE ARTHROPLASTY;  Surgeon: Mardee Lynwood SQUIBB, MD;  Location: [facility];  Service: Orthopedics;  Laterality: Right;   KNEE ARTHROPLASTY Left 11/19/2020   Procedure: COMPUTER ASSISTED TOTAL KNEE ARTHROPLASTY;  Surgeon: Mardee Lynwood SQUIBB, MD;  Location: [facility];  Service: Orthopedics;  Laterality: Left;   LOWER EXTREMITY ANGIOGRAPHY Left 02/10/2023   Procedure: Lower Extremity Angiography;  Surgeon: Jama Cordella MATSU, MD;  Location: [facility];  Service: Cardiovascular;  Laterality: Left;   ORIF PATELLA Left 04/16/2021   Procedure: OPEN REDUCTION INTERNAL (ORIF) FIXATION PATELLA;  Surgeon: Mardee Lynwood SQUIBB, MD;  Location: [facility];  Service: Orthopedics;  Laterality: Left;   ORIF PATELLA Left 05/10/2021   Procedure: OPEN REDUCTION INTERNAL (ORIF) FIXATION PATELLA;  Surgeon: Mardee Lynwood SQUIBB, MD;  Location: [facility];  Service: Orthopedics;   Laterality: Left;   Patient Active Problem List   Diagnosis Date Noted   PAD (peripheral artery disease) 02/11/2023   Amputation of great toe 02/11/2023   Foot osteomyelitis, left (HCC) 02/05/2023   Inflammatory arthritis 02/05/2023   Left arm swelling 02/05/2023   Hypokalemia 12/03/2021   Acute CHF (HCC) 12/02/2021   Chronic atrial fibrillation (HCC) 12/02/2021   HTN (hypertension) 12/02/2021   HLD (hyperlipidemia) 12/02/2021   Diabetes mellitus without complication (HCC) 12/02/2021   Leukocytosis 12/02/2021   Obesity with body mass index (BMI) of 30.0 to 39.9 12/02/2021   Hypomagnesemia 12/02/2021   S/P ORIF (open reduction internal fixation) fracture 05/10/2021   Patella fracture 04/16/2021   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/19/2020   Status post total right knee replacement 01/18/2020   Atrial fibrillation and flutter (HCC) 07/09/2018   DM type 2 with diabetic mixed hyperlipidemia (HCC) 02/23/2018   B12 deficiency 07/17/2016   Benign essential hypertension 07/17/2016   Chronic painful diabetic neuropathy (HCC) 07/17/2016   Hyperlipidemia, mixed 07/17/2016   Symptomatic PVCs 07/17/2016    PCP: Kristopher Oneil FALCON, MD  REFERRING PROVIDER: Hilma Hastings, PA-C  REFERRING DIAG:  [id number] (ICD-10-CM) - Spinal stenosis of lumbar region with neurogenic claudication  M47.816 (ICD-10-CM) - Lumbar spondylosis    RATIONALE FOR EVALUATION AND TREATMENT: Rehabilitation  THERAPY DIAG: Spinal stenosis of lumbar region with neurogenic claudication  Difficulty in walking, not elsewhere classified  Muscle weakness (generalized)  ONSET DATE: Since Jan 2023 during which pt had L TKA completed   FOLLOW-UP APPT SCHEDULED WITH REFERRING PROVIDER: Yes ; pt is awaiting L-spine MRI and further f/u with Kristopher Boys, PA-C  PERTINENT HISTORY: Pt reports primary c/o weakness in his legs. Pt had previous discectomy at L5-S1 in 1993. Weakness in legs is associated with neurogenic claudication  per patient. Leg weakness resolves with sitting. Patient reports notable fatigue in back versus pain.   [name] has a history of afib/aflutter, HTN, CHF, PAD, DM with neuropathy, hyperlipidemia, obesity, CKD, parkinson's. He sees rheumatology for inflammatory arthritis; pt denies Hx of RA. History of lumbar laminectomy in 1993. He did well after this surgery.    He has intermittent LBP with weakness in both legs with standing and walking. He feels unsteady when walking- he is easily fatigues. He has known neuropathy with numbness in his feet. Hx of L great toe amputation without any prosthesis donned at this time. He has no pain with sitting. He does better with grocery cart.    History of bilateral TKA - had complications with left one; patellar component had to be revised. Pt reports having ongoing issues with L knee, but not so much with RLE. He reports having difficulty with getting unsteady on his feet. Pt reports notable fatigue affecting bilateral hamstrings and down to ankles. Pt occasionally uses cane/walking stick. Pt has to lean forward when he walks   PAIN:    Pt states pain is not primary problem Numbness/Tingling: Yes; bilateral feet Focal Weakness: Yes; hamstrings/calf muscles Aggravating factors: standing, walking, exertion (e.g. making bed) Relieving factors: sitting down, leaning onto cart/pitching trunk forward 24-hour pain behavior: None How long can you stand: Pt can walk up to 150 yards in his park History of prior back injury, pain, surgery, or therapy: Yes; previous microdiscectomy; pt had PT for back many years ago at [facility] Imaging: Yes ; L-spine MRI 12/29/23  IMPRESSION: 1. 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 Recommend adrenal protocol MRI with and without contrast for further evaluation. 2. Spondylosis appears worst at L4-5 where there is moderate to moderately severe central canal stenosis and narrowing of  both lateral recesses.   Red flags: Negative for bowel/bladder changes, saddle paresthesia, personal history of cancer, h/o spinal tumors, h/o compression fx, h/o abdominal aneurysm, abdominal pain, chills/fever, night sweats, nausea, vomiting, unrelenting pain, first onset of insidious LBP <18 y/o  -increased urinary frequency, has abated somewhat - with diuretic  PRECAUTIONS: None  WEIGHT BEARING RESTRICTIONS: No  FALLS: Has patient fallen in last 6 months? No  -one fall > 6 months ago; heel caught step above him while pt was carrying bags  Living Environment Lives with: lives with their spouse, spouse is working during the day; pt is semi-retired Lives in: Mobile home, pt takes golf cart to bring trash down to [employer]: Yes: External: 3 steps; can reach both Has following equipment at home: Single point cane  Prior level of function: Independent  Occupational demands: Semi-retired; part-time IT work  Hobbies: Able to return to golfing   Patient Goals: Will PT help with numbness in legs/pain    OBJECTIVE (data from initial evaluation unless otherwise dated):   Patient Surveys  Modified Oswestry:  MODIFIED OSWESTRY DISABILITY SCALE  Date: 01/05/24 Score  Pain intensity 1 = The pain is bad, but I can manage without having to  take (1) I can stand as long as I want but, it increases my pain. pain medication.  2. Personal care (washing, dressing, etc.) 0 =  I can take care of myself normally without causing increased pain.  3. Lifting 4 = I can lift only very light weights  4. Walking 2 =  Pain prevents me from walking more than  mile.  5. Sitting 0 =  I can sit in any chair as long as I like.  6. Standing 4 =  Pain prevents me from standing more than 10 minutes.  7. Sleeping 0 = Pain does not prevent me from sleeping well.  8. Social Life 2 = Pain prevents me from participating in more energetic activities (eg. sports, dancing).  9. Traveling 0 =  I can travel  anywhere without increased pain.  10. Employment/ Homemaking 0 = My normal homemaking/job activities do not cause pain.  Total 13/50 = 26%    GAIT: Distance walked: 40 ft  Assistive device utilized: None Level of assistance: SBA Comments: Dec arm swing, shortened stride length, L antalgic pattern with dec stance time  Posture: Lumbar lordosis: Decreased Iliac crest height: Equal bilaterally Lumbar lateral shift: Negative  AROM AROM (Normal range in degrees) AROM  01/05/24  Lumbar   Flexion (65) 75% (tight in hamstrings)  Extension (30) 25%  Right lateral flexion (25) 75%  Left lateral flexion (25) 75%  Right rotation (30) WNL  Left rotation (30) 75% (tightness in L flank region)      Hip Right Left  Flexion (125)    Extension (15)    Abduction (40)    Adduction     Internal Rotation (45)    External Rotation (45)        Knee    Flexion (135)    Extension (0)        Ankle    Dorsiflexion (20)    Plantarflexion (50)    Inversion (35)    Eversion (15)    (* = pain; Blank rows = not tested)  LE MMT: MMT (out of 5) Right 01/05/24 Left 01/05/24  Hip flexion 4- 3+  Hip extension (updated 01/07/24) 4- 4-  Hip abduction (updated 01/07/24) 5 seated 4- sidelying 5 seated 4- sidelying  Hip adduction 4+ 4+  Hip internal rotation    Hip external rotation    Knee flexion 5 5-  Knee extension 4+ 4+  Ankle dorsiflexion 4 4  Ankle plantarflexion    Ankle inversion    Ankle eversion    (* = pain; Blank rows = not tested)  Sensation Decreased light touch sensation in stocking glove pattern in feet. Proprioception, stereognosis, and hot/cold testing deferred on this date.  Muscle Length Hamstrings: R: Positive L: Positive Ely (quadriceps): R: Not examined L: Not examined Thomas (hip flexors): R: Not examined L: Not examined   Palpation Not examined - no c/o pain Location Right Left         Lumbar paraspinals    Quadratus Lumborum    Gluteus Maximus    Gluteus  Medius    Deep hip external rotators    PSIS    Fortin's Area (SIJ)    Greater Trochanter    (Blank rows = not tested) Graded on 0-4 scale (0 = no pain, 1 = pain, 2 = pain with wincing/grimacing/flinching, 3 = pain with withdrawal, 4 = unwilling to allow palpation)   Special Tests Lumbar Radiculopathy and Discogenic: Centralization and Peripheralization (SN 92, -LR 0.12):  No notable symptoms in lying following repeated flexion in lying/double knee to chest Slump (SN 83, -LR 0.32): R: Negative L: Negative SLR (SN 92, -LR 0.29): R: Negative L:  Negative   Facet Joint: Extension-Rotation (SN 100, -LR 0.0): R: Negative L: Negative  Lumbar Foraminal Stenosis: Lumbar quadrant (SN 70): R: Negative L: Negative  Hip: FABER (SN 81): R: Not examined L: Not examined     TODAY'S TREATMENT: DATE: 01/12/2024   SUBJECTIVE STATEMENT:   Patient reports no major soreness after last visit. Pt denies pain at arrival. Pt reports working on sit to stands, but this is still challenging. Pt's physician told him to hold on going to gym at this time while he is awaiting MRI results and potential L-spine surgery.    Therapeutic Exercise - for improved soft tissue flexibility and extensibility as needed for ROM, improved strength as needed to improve performance of CKC activities/functional movements   NuStep; Level 4, x 5 minutes - for improved soft tissue mobility and increased tissue temperature to improve muscle performance   -subjective gathered during this time  3-way kick hip; x 5 ea dir, bilat   Standing march with 5-lb ankle weights; 2 x 10  PATIENT EDUCATION: Discussed alternatives for aerobic training at home/in patient's neighborhood and encouraged continued HEP.    *not today* SLR; 1 x 10, 1 x 15 bilat Standing HR/TR, light touch on // bars; 2 x 15   Neuromuscular Re-education - for nervous system downregulation, gluteal musculature activation and exercises to promote LE kinetic  chain stability   Sidelying hip abduction; 2 x  15, bilat  Bridge with posterior pelvic tilt; 2 x 12  Dying bug; attempted with UE/LE  -performed with LE only with maintenance of PPT/abdominal brace; 1 x 15 alt R/L   Therapeutic Activities - patient education, repetitive task practice for improved performance of daily functional activities e.g. transferring  Sit to stand with Airex on seat for inc height; 2 x 10  -reviewed for HEP to determine technique to allow best carryover for pt at home  Minisquat standing adjacent to treadmill armrest; 2 x 10, 1 x 12     PATIENT EDUCATION:  Education details: see above for patient education details Person educated: Patient Education method: Explanation, Demonstration, and Handouts Education comprehension: verbalized understanding and returned demonstration   HOME EXERCISE PROGRAM:  Access Code: [email or web address] URL: [email or web address] Date: 01/07/2024 Prepared by: Venetia Endo  Exercises - Supine Double Knee to Chest  - 3 x daily - 7 x weekly - 2 sets - 10 reps - 1-2sec hold - Seated Lumbar Flexion Stretch  - 3 x daily - 7 x weekly - 2 sets - 10 reps - 1-2sec hold - Seated Hamstring Stretch  - 2 x daily - 7 x weekly - 3 sets - 30sec hold - Sit to Stand with Armchair  - 1 x daily - 7 x weekly - 2-3 sets - 5-10 reps - Supine Active Straight Leg Raise  - 1 x daily - 7 x weekly - 2-3 sets - 12-15 reps - Supine Bridge  - 1 x daily - 7 x weekly - 2-3 sets - 10-12 reps - 2sec hold - Heel Toe Raises with Unilateral Counter Support  - 1 x daily - 7 x weekly - 2-3 sets - 10 reps - 15 hold   ASSESSMENT:  CLINICAL IMPRESSION: Patient is a 73 y.o. male who was seen today for physical therapy treatment for lumbar spinal stenosis with neurogenic claudication.  Pt is able to increase volume of standing exercise with intermittent sitting breaks as needed for LE weakness/claudication. Pt tolerates supine/sidelying drills well without  notable LE fatigue compared to standing work. Pt is still notably challenged with sit to stand, but he is able to perform unassisted sit to stand with chair + Airex for increased height. We discussed strategies for cardiovascular training safely in home/neighborhood. Pt has current deficits in: LE strength, posterior chain flexibility/hamstrings length, decreased thoracolumbar AROM, decreased positional tolerance for standing/walking. Pt will continue to benefit from skilled PT services to address deficits and improve function.  OBJECTIVE IMPAIRMENTS: Abnormal gait, difficulty walking, decreased ROM, decreased strength, hypomobility, impaired flexibility, postural dysfunction, and pain.   ACTIVITY LIMITATIONS: carrying, lifting, standing, transfers, reach over head, and locomotion level  PARTICIPATION LIMITATIONS: meal prep, cleaning, shopping, community activity, and taking out trash in his neighborhood  PERSONAL FACTORS: Age, Time since onset of injury/illness/exacerbation, and 3+ comorbidities: (chronic neuropathy, PAD, HTN, A-fib, Hx of L great toe amputation, HLD) are also affecting patient's functional outcome.   REHAB POTENTIAL: Good  CLINICAL DECISION MAKING: Evolving/moderate complexity  EVALUATION COMPLEXITY: Moderate   GOALS: Goals reviewed with patient? No  SHORT TERM GOALS: Target date: 01/28/2024  Pt will be independent with HEP in order to improve strength and decrease back pain to improve pain-free function at home and work. Baseline: 01/05/24: Baseline HEP initiated.  Goal status: INITIAL   LONG TERM GOALS: Target date: 02/18/2024  Pt will have MMT 4+/5 for all tested LE musculature indicative of increased strength as needed for improved mm endurance and ability to complete prolonged weightbearing tasks Baseline:  Goal status: INITIAL  2.  Pt will complete standing/walking bouts up to 30 mins without claudication symptoms > 1-2/10 as needed for completing community  outings, social outings, and errands. Baseline: 01/05/24: Difficulty walking 150 yards to get around neighborhood/park Goal status: INITIAL  3.  Pt will decrease mODI score by at least 13 points in order demonstrate clinically significant reduction in back pain/disability.       Baseline: 01/05/24: 13/50 = 26% Goal status: INITIAL  4.  Pt will be compliant and independent with advanced HEP as needed for maintaining long-term level of function.  Baseline: 01/05/24: Baseline HEP initiated Goal status: INITIAL   PLAN: PT FREQUENCY: 1-2x/week  PT DURATION: 6 weeks  PLANNED INTERVENTIONS: Therapeutic exercises, Therapeutic activity, Neuromuscular re-education, Balance training, Gait training, Patient/Family education, Self Care, Joint mobilization, Joint manipulation, Vestibular training, Canalith repositioning, Orthotic/Fit training, DME instructions, Dry Needling, Electrical stimulation, Spinal manipulation, Spinal mobilization, Cryotherapy, Moist heat, Taping, Traction, Ultrasound, Ionotophoresis 4mg /ml Dexamethasone , Manual therapy, and Re-evaluation.  PLAN FOR NEXT SESSION: Continue with thoracolumbar mobility and progressive resistance drills with emphasis on endurance; progressive aerobic  exercise.    Venetia Endo, PT, DPT #[id number]  Venetia ONEIDA Endo, PT 01/12/2024, 7:39 AM

## 2024-01-13 ENCOUNTER — Ambulatory Visit
Admission: RE | Admit: 2024-01-13 | Discharge: 2024-01-13 | Disposition: A | Discharge status: Home / Self Care | Source: Ambulatory Visit | Attending: Internal Medicine | Admitting: Internal Medicine

## 2024-01-13 DIAGNOSIS — F0390 Unspecified dementia without behavioral disturbance: Secondary | ICD-10-CM | POA: Insufficient documentation

## 2024-01-13 DIAGNOSIS — E278 Other specified disorders of adrenal gland: Secondary | ICD-10-CM | POA: Diagnosis present

## 2024-01-13 DIAGNOSIS — R400 Somnolence: Secondary | ICD-10-CM | POA: Insufficient documentation

## 2024-01-13 MED ORDER — GADOBUTROL 1 MMOL/ML IV SOLN
10.0000 mL | Freq: Once | INTRAVENOUS | Status: AC | PRN
  Administered 2024-01-13: 10 mL via INTRAVENOUS

## 2024-01-14 ENCOUNTER — Ambulatory Visit: Admitting: Physical Therapy

## 2024-01-14 ENCOUNTER — Encounter: Payer: Self-pay | Admitting: Physical Therapy

## 2024-01-14 DIAGNOSIS — R262 Difficulty in walking, not elsewhere classified: Secondary | ICD-10-CM

## 2024-01-14 DIAGNOSIS — M6281 Muscle weakness (generalized): Secondary | ICD-10-CM

## 2024-01-14 DIAGNOSIS — M48062 Spinal stenosis, lumbar region with neurogenic claudication: Secondary | ICD-10-CM

## 2024-01-14 DIAGNOSIS — D3502 Benign neoplasm of left adrenal gland: Secondary | ICD-10-CM | POA: Insufficient documentation

## 2024-01-14 NOTE — Therapy (Signed)
 OUTPATIENT PHYSICAL THERAPY TREATMENT   Patient Name: Kristopher Oneal MRN: [id number] DOB:1950-08-24, 73 y.o., male Today's Date: 01/14/2024   END OF SESSION:  PT End of Session - 01/14/24 0729     Visit Number 4    Number of Visits 13    Date for Recertification  02/18/24    PT Start Time 0730    PT Stop Time 0812    PT Time Calculation (min) 42 min    Behavior During Therapy [facility] for tasks assessed/performed            Past Medical History:  Diagnosis Date   Anxiety    Arrhythmia    atrial fibrillation   Arthritis    CHF (congestive heart failure) (HCC)    Diabetes mellitus without complication (HCC)    type 2   Dysrhythmia    PVC/ a fib   Heart murmur    High cholesterol    Hypertension    Past Surgical History:  Procedure Laterality Date   AMPUTATION TOE Left 02/10/2023   Procedure: Left Hallux Amputation;  Surgeon: Neill Boas, DPM;  Location: [facility] OR[facility];  Service: Orthopedics/Podiatry;  Laterality: Left;   BACK SURGERY     l5/s1   COLONOSCOPY     KNEE ARTHROPLASTY Right 01/02/2020   Procedure: COMPUTER ASSISTED TOTAL KNEE ARTHROPLASTY;  Surgeon: Mardee Lynwood SQUIBB, MD;  Location: [facility];  Service: Orthopedics;  Laterality: Right;   KNEE ARTHROPLASTY Left 11/19/2020   Procedure: COMPUTER ASSISTED TOTAL KNEE ARTHROPLASTY;  Surgeon: Mardee Lynwood SQUIBB, MD;  Location: [facility];  Service: Orthopedics;  Laterality: Left;   LOWER EXTREMITY ANGIOGRAPHY Left 02/10/2023   Procedure: Lower Extremity Angiography;  Surgeon: Jama Cordella MATSU, MD;  Location: [facility];  Service: Cardiovascular;  Laterality: Left;   ORIF PATELLA Left 04/16/2021   Procedure: OPEN REDUCTION INTERNAL (ORIF) FIXATION PATELLA;  Surgeon: Mardee Lynwood SQUIBB, MD;  Location: [facility];  Service: Orthopedics;  Laterality: Left;   ORIF PATELLA Left 05/10/2021   Procedure: OPEN REDUCTION INTERNAL (ORIF) FIXATION PATELLA;  Surgeon: Mardee Lynwood SQUIBB, MD;  Location: [facility];  Service: Orthopedics;   Laterality: Left;   Patient Active Problem List   Diagnosis Date Noted   PAD (peripheral artery disease) 02/11/2023   Amputation of great toe 02/11/2023   Foot osteomyelitis, left (HCC) 02/05/2023   Inflammatory arthritis 02/05/2023   Left arm swelling 02/05/2023   Hypokalemia 12/03/2021   Acute CHF (HCC) 12/02/2021   Chronic atrial fibrillation (HCC) 12/02/2021   HTN (hypertension) 12/02/2021   HLD (hyperlipidemia) 12/02/2021   Diabetes mellitus without complication (HCC) 12/02/2021   Leukocytosis 12/02/2021   Obesity with body mass index (BMI) of 30.0 to 39.9 12/02/2021   Hypomagnesemia 12/02/2021   S/P ORIF (open reduction internal fixation) fracture 05/10/2021   Patella fracture 04/16/2021   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/19/2020   Status post total right knee replacement 01/18/2020   Atrial fibrillation and flutter (HCC) 07/09/2018   DM type 2 with diabetic mixed hyperlipidemia (HCC) 02/23/2018   B12 deficiency 07/17/2016   Benign essential hypertension 07/17/2016   Chronic painful diabetic neuropathy (HCC) 07/17/2016   Hyperlipidemia, mixed 07/17/2016   Symptomatic PVCs 07/17/2016    PCP: Cleotilde Oneil FALCON, MD  REFERRING PROVIDER: Hilma Hastings, PA-C  REFERRING DIAG:  [phone] (ICD-10-CM) - Spinal stenosis of lumbar region with neurogenic claudication  M47.816 (ICD-10-CM) - Lumbar spondylosis    RATIONALE FOR EVALUATION AND TREATMENT: Rehabilitation  THERAPY DIAG: Spinal stenosis of lumbar region with  neurogenic claudication  Difficulty in walking, not elsewhere classified  Muscle weakness (generalized)  ONSET DATE: Since Jan 2023 during which pt had L TKA completed   FOLLOW-UP APPT SCHEDULED WITH REFERRING PROVIDER: Yes ; pt is awaiting L-spine MRI and further f/u with Glade Boys, PA-C  PERTINENT HISTORY: Pt reports primary c/o weakness in his legs. Pt had previous discectomy at L5-S1 in 1993. Weakness in legs is associated with neurogenic claudication  per patient. Leg weakness resolves with sitting. Patient reports notable fatigue in back versus pain.   Mr. Keelen Quevedo has a history of afib/aflutter, HTN, CHF, PAD, DM with neuropathy, hyperlipidemia, obesity, CKD, parkinson's. He sees rheumatology for inflammatory arthritis; pt denies Hx of RA. History of lumbar laminectomy in 1993. He did well after this surgery.    He has intermittent LBP with weakness in both legs with standing and walking. He feels unsteady when walking- he is easily fatigues. He has known neuropathy with numbness in his feet. Hx of L great toe amputation without any prosthesis donned at this time. He has no pain with sitting. He does better with grocery cart.    History of bilateral TKA - had complications with left one; patellar component had to be revised. Pt reports having ongoing issues with L knee, but not so much with RLE. He reports having difficulty with getting unsteady on his feet. Pt reports notable fatigue affecting bilateral hamstrings and down to ankles. Pt occasionally uses cane/walking stick. Pt has to lean forward when he walks   PAIN:    Pt states pain is not primary problem Numbness/Tingling: Yes; bilateral feet Focal Weakness: Yes; hamstrings/calf muscles Aggravating factors: standing, walking, exertion (e.g. making bed) Relieving factors: sitting down, leaning onto cart/pitching trunk forward 24-hour pain behavior: None How long can you stand: Pt can walk up to 150 yards in his park History of prior back injury, pain, surgery, or therapy: Yes; previous microdiscectomy; pt had PT for back many years ago at [facility] Imaging: Yes ; L-spine MRI 12/29/23  IMPRESSION: 1. 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 Recommend adrenal protocol MRI with and without contrast for further evaluation. 2. Spondylosis appears worst at L4-5 where there is moderate to moderately severe central canal stenosis and narrowing of  both lateral recesses.   Red flags: Negative for bowel/bladder changes, saddle paresthesia, personal history of cancer, h/o spinal tumors, h/o compression fx, h/o abdominal aneurysm, abdominal pain, chills/fever, night sweats, nausea, vomiting, unrelenting pain, first onset of insidious LBP <18 y/o  -increased urinary frequency, has abated somewhat - with diuretic  PRECAUTIONS: None  WEIGHT BEARING RESTRICTIONS: No  FALLS: Has patient fallen in last 6 months? No  -one fall > 6 months ago; heel caught step above him while pt was carrying bags  Living Environment Lives with: lives with their spouse, spouse is working during the day; pt is semi-retired Lives in: Mobile home, pt takes golf cart to bring trash down to [employer]: Yes: External: 3 steps; can reach both Has following equipment at home: Single point cane  Prior level of function: Independent  Occupational demands: Semi-retired; part-time IT work  Hobbies: Able to return to golfing   Patient Goals: Will PT help with numbness in legs/pain    OBJECTIVE (data from initial evaluation unless otherwise dated):   Patient Surveys  Modified Oswestry:  MODIFIED OSWESTRY DISABILITY SCALE  Date: 01/05/24 Score  Pain intensity 1 = The pain is bad, but I can manage  without having to take (1) I can stand as long as I want but, it increases my pain. pain medication.  2. Personal care (washing, dressing, etc.) 0 =  I can take care of myself normally without causing increased pain.  3. Lifting 4 = I can lift only very light weights  4. Walking 2 =  Pain prevents me from walking more than  mile.  5. Sitting 0 =  I can sit in any chair as long as I like.  6. Standing 4 =  Pain prevents me from standing more than 10 minutes.  7. Sleeping 0 = Pain does not prevent me from sleeping well.  8. Social Life 2 = Pain prevents me from participating in more energetic activities (eg. sports, dancing).  9. Traveling 0 =  I can travel  anywhere without increased pain.  10. Employment/ Homemaking 0 = My normal homemaking/job activities do not cause pain.  Total 13/50 = 26%    GAIT: Distance walked: 40 ft  Assistive device utilized: None Level of assistance: SBA Comments: Dec arm swing, shortened stride length, L antalgic pattern with dec stance time  Posture: Lumbar lordosis: Decreased Iliac crest height: Equal bilaterally Lumbar lateral shift: Negative  AROM AROM (Normal range in degrees) AROM  01/05/24  Lumbar   Flexion (65) 75% (tight in hamstrings)  Extension (30) 25%  Right lateral flexion (25) 75%  Left lateral flexion (25) 75%  Right rotation (30) WNL  Left rotation (30) 75% (tightness in L flank region)      Hip Right Left  Flexion (125)    Extension (15)    Abduction (40)    Adduction     Internal Rotation (45)    External Rotation (45)        Knee    Flexion (135)    Extension (0)        Ankle    Dorsiflexion (20)    Plantarflexion (50)    Inversion (35)    Eversion (15)    (* = pain; Blank rows = not tested)  LE MMT: MMT (out of 5) Right 01/05/24 Left 01/05/24  Hip flexion 4- 3+  Hip extension (updated 01/07/24) 4- 4-  Hip abduction (updated 01/07/24) 5 seated 4- sidelying 5 seated 4- sidelying  Hip adduction 4+ 4+  Hip internal rotation    Hip external rotation    Knee flexion 5 5-  Knee extension 4+ 4+  Ankle dorsiflexion 4 4  Ankle plantarflexion    Ankle inversion    Ankle eversion    (* = pain; Blank rows = not tested)  Sensation Decreased light touch sensation in stocking glove pattern in feet. Proprioception, stereognosis, and hot/cold testing deferred on this date.  Muscle Length Hamstrings: R: Positive L: Positive Ely (quadriceps): R: Not examined L: Not examined Thomas (hip flexors): R: Not examined L: Not examined   Palpation Not examined - no c/o pain Location Right Left         Lumbar paraspinals    Quadratus Lumborum    Gluteus Maximus    Gluteus  Medius    Deep hip external rotators    PSIS    Fortin's Area (SIJ)    Greater Trochanter    (Blank rows = not tested) Graded on 0-4 scale (0 = no pain, 1 = pain, 2 = pain with wincing/grimacing/flinching, 3 = pain with withdrawal, 4 = unwilling to allow palpation)   Special Tests Lumbar Radiculopathy and Discogenic: Centralization and Peripheralization (SN  92, -LR 0.12): No notable symptoms in lying following repeated flexion in lying/double knee to chest Slump (SN 83, -LR 0.32): R: Negative L: Negative SLR (SN 92, -LR 0.29): R: Negative L:  Negative   Facet Joint: Extension-Rotation (SN 100, -LR 0.0): R: Negative L: Negative  Lumbar Foraminal Stenosis: Lumbar quadrant (SN 70): R: Negative L: Negative  Hip: FABER (SN 81): R: Not examined L: Not examined     TODAY'S TREATMENT: DATE: 01/14/2024   SUBJECTIVE STATEMENT:   Patient reports no major soreness after last visit. Patient had MRI yesterday and is awaiting f/u with MD. Patient reports no other new complaints this AM.    Therapeutic Exercise - for improved soft tissue flexibility and extensibility as needed for ROM, improved strength as needed to improve performance of CKC activities/functional movements   NuStep; Level 4, x 5 minutes - for improved soft tissue mobility and increased tissue temperature to improve muscle performance   -subjective gathered during this time  3-way kick hip; x 5 ea dir, bilat   Standing march with 5-lb ankle weights; 2 x 10  Open book; 1 x 10 on ea side  PATIENT EDUCATION: Discussed alternatives for aerobic training at home/in patient's neighborhood and encouraged continued HEP.    *not today* SLR; 1 x 10, 1 x 15 bilat Standing HR/TR, light touch on // bars; 2 x 15   Neuromuscular Re-education - for nervous system downregulation, gluteal musculature activation and exercises to promote LE kinetic chain stability   Sidelying hip abduction; 2 x  15, bilat  Bridge with posterior  pelvic tilt; 2 x 15  Dying bug; attempted with UE/LE  -performed with LE only with maintenance of PPT/abdominal brace; 1 x 15 alt R/L   Therapeutic Activities - patient education, repetitive task practice for improved performance of daily functional activities e.g. transferring  Sit to stand with Airex on seat for inc height; 2 x 10  -reviewed for HEP to determine technique to allow best carryover for pt at home  Minisquat standing adjacent to treadmill armrest;  1 x 15, 1 x 12     PATIENT EDUCATION:  Education details: see above for patient education details Person educated: Patient Education method: Explanation, Demonstration, and Handouts Education comprehension: verbalized understanding and returned demonstration   HOME EXERCISE PROGRAM:  Access Code: [email or web address] URL: [email or web address] Date: 01/07/2024 Prepared by: Venetia Endo  Exercises - Supine Double Knee to Chest  - 3 x daily - 7 x weekly - 2 sets - 10 reps - 1-2sec hold - Seated Lumbar Flexion Stretch  - 3 x daily - 7 x weekly - 2 sets - 10 reps - 1-2sec hold - Seated Hamstring Stretch  - 2 x daily - 7 x weekly - 3 sets - 30sec hold - Sit to Stand with Armchair  - 1 x daily - 7 x weekly - 2-3 sets - 5-10 reps - Supine Active Straight Leg Raise  - 1 x daily - 7 x weekly - 2-3 sets - 12-15 reps - Supine Bridge  - 1 x daily - 7 x weekly - 2-3 sets - 10-12 reps - 2sec hold - Heel Toe Raises with Unilateral Counter Support  - 1 x daily - 7 x weekly - 2-3 sets - 10 reps - 15 hold   ASSESSMENT:  CLINICAL IMPRESSION: Patient is awaiting MRI to further check pathoanatomy of lumbar spine and investigate need for surgery/further medical management. We continued with LE strengthening and exercise to further promote muscle endurance.  We are working on gradually progressing volume and intensity as tolerated to improve patient's capacity for gait and functional mobility tasks.  Pt has current deficits in: LE  strength, posterior chain flexibility/hamstrings length, decreased thoracolumbar AROM, decreased positional tolerance for standing/walking. Pt will continue to benefit from skilled PT services to address deficits and improve function.  OBJECTIVE IMPAIRMENTS: Abnormal gait, difficulty walking, decreased ROM, decreased strength, hypomobility, impaired flexibility, postural dysfunction, and pain.   ACTIVITY LIMITATIONS: carrying, lifting, standing, transfers, reach over head, and locomotion level  PARTICIPATION LIMITATIONS: meal prep, cleaning, shopping, community activity, and taking out trash in his neighborhood  PERSONAL FACTORS: Age, Time since onset of injury/illness/exacerbation, and 3+ comorbidities: (chronic neuropathy, PAD, HTN, A-fib, Hx of L great toe amputation, HLD) are also affecting patient's functional outcome.   REHAB POTENTIAL: Good  CLINICAL DECISION MAKING: Evolving/moderate complexity  EVALUATION COMPLEXITY: Moderate   GOALS: Goals reviewed with patient? No  SHORT TERM GOALS: Target date: 01/28/2024  Pt will be independent with HEP in order to improve strength and decrease back pain to improve pain-free function at home and work. Baseline: 01/05/24: Baseline HEP initiated.  Goal status: INITIAL   LONG TERM GOALS: Target date: 02/18/2024  Pt will have MMT 4+/5 for all tested LE musculature indicative of increased strength as needed for improved mm endurance and ability to complete prolonged weightbearing tasks Baseline:  Goal status: INITIAL  2.  Pt will complete standing/walking bouts up to 30 mins without claudication symptoms > 1-2/10 as needed for completing community outings, social outings, and errands. Baseline: 01/05/24: Difficulty walking 150 yards to get around neighborhood/park Goal status: INITIAL  3.  Pt will decrease mODI score by at least 13 points in order demonstrate clinically significant reduction in back pain/disability.       Baseline: 01/05/24:  13/50 = 26% Goal status: INITIAL  4.  Pt will be compliant and independent with advanced HEP as needed for maintaining long-term level of function.  Baseline: 01/05/24: Baseline HEP initiated Goal status: INITIAL   PLAN: PT FREQUENCY: 1-2x/week  PT DURATION: 6 weeks  PLANNED INTERVENTIONS: Therapeutic exercises, Therapeutic activity, Neuromuscular re-education, Balance training, Gait training, Patient/Family education, Self Care, Joint mobilization, Joint manipulation, Vestibular training, Canalith repositioning, Orthotic/Fit training, DME instructions, Dry Needling, Electrical stimulation, Spinal manipulation, Spinal mobilization, Cryotherapy, Moist heat, Taping, Traction, Ultrasound, Ionotophoresis 4mg /ml Dexamethasone , Manual therapy, and Re-evaluation.  PLAN FOR NEXT SESSION: Continue with thoracolumbar mobility and progressive resistance drills with emphasis on endurance; progressive aerobic  exercise.    Venetia Endo, PT, DPT #[id number]  Venetia ONEIDA Endo, PT 01/14/2024, 7:29 AM

## 2024-01-18 NOTE — Therapy (Unsigned)
 OUTPATIENT PHYSICAL THERAPY TREATMENT   Patient Name: Kristopher Oneal MRN: [id number] DOB:07-24-1950, 73 y.o., male Today's Date: 01/19/2024   END OF SESSION:  PT End of Session - 01/19/24 0737     Visit Number 5    Number of Visits 13    Date for Recertification  02/18/24    PT Start Time 0734    PT Stop Time 0816    PT Time Calculation (min) 42 min    Behavior During Therapy [facility] for tasks assessed/performed          Past Medical History:  Diagnosis Date   Anxiety    Arrhythmia    atrial fibrillation   Arthritis    CHF (congestive heart failure) (HCC)    Diabetes mellitus without complication (HCC)    type 2   Dysrhythmia    PVC/ a fib   Heart murmur    High cholesterol    Hypertension    Past Surgical History:  Procedure Laterality Date   AMPUTATION TOE Left 02/10/2023   Procedure: Left Hallux Amputation;  Surgeon: Kristopher Oneal, DPM;  Location: [facility];  Service: Orthopedics/Podiatry;  Laterality: Left;   BACK SURGERY     l5/s1   COLONOSCOPY     KNEE ARTHROPLASTY Right 01/02/2020   Procedure: COMPUTER ASSISTED TOTAL KNEE ARTHROPLASTY;  Surgeon: Kristopher Lynwood SQUIBB, MD;  Location: [facility];  Service: Orthopedics;  Laterality: Right;   KNEE ARTHROPLASTY Left 11/19/2020   Procedure: COMPUTER ASSISTED TOTAL KNEE ARTHROPLASTY;  Surgeon: Kristopher Lynwood SQUIBB, MD;  Location: [facility];  Service: Orthopedics;  Laterality: Left;   LOWER EXTREMITY ANGIOGRAPHY Left 02/10/2023   Procedure: Lower Extremity Angiography;  Surgeon: Kristopher Cordella MATSU, MD;  Location: [facility];  Service: Cardiovascular;  Laterality: Left;   ORIF PATELLA Left 04/16/2021   Procedure: OPEN REDUCTION INTERNAL (ORIF) FIXATION PATELLA;  Surgeon: Kristopher Lynwood SQUIBB, MD;  Location: [facility];  Service: Orthopedics;  Laterality: Left;   ORIF PATELLA Left 05/10/2021   Procedure: OPEN REDUCTION INTERNAL (ORIF) FIXATION PATELLA;  Surgeon: Kristopher Lynwood SQUIBB, MD;  Location: [facility];  Service: Orthopedics;   Laterality: Left;   Patient Active Problem List   Diagnosis Date Noted   PAD (peripheral artery disease) 02/11/2023   Amputation of great toe 02/11/2023   Foot osteomyelitis, left (HCC) 02/05/2023   Inflammatory arthritis 02/05/2023   Left arm swelling 02/05/2023   Hypokalemia 12/03/2021   Acute CHF (HCC) 12/02/2021   Chronic atrial fibrillation (HCC) 12/02/2021   HTN (hypertension) 12/02/2021   HLD (hyperlipidemia) 12/02/2021   Diabetes mellitus without complication (HCC) 12/02/2021   Leukocytosis 12/02/2021   Obesity with body mass index (BMI) of 30.0 to 39.9 12/02/2021   Hypomagnesemia 12/02/2021   S/P ORIF (open reduction internal fixation) fracture 05/10/2021   Patella fracture 04/16/2021   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/19/2020   Status post total right knee replacement 01/18/2020   Atrial fibrillation and flutter (HCC) 07/09/2018   DM type 2 with diabetic mixed hyperlipidemia (HCC) 02/23/2018   B12 deficiency 07/17/2016   Benign essential hypertension 07/17/2016   Chronic painful diabetic neuropathy (HCC) 07/17/2016   Hyperlipidemia, mixed 07/17/2016   Symptomatic PVCs 07/17/2016    PCP: Kristopher Oneil FALCON, MD  REFERRING PROVIDER: Hilma Hastings, PA-C  REFERRING DIAG:  [phone] (ICD-10-CM) - Spinal stenosis of lumbar region with neurogenic claudication  M47.816 (ICD-10-CM) - Lumbar spondylosis    RATIONALE FOR EVALUATION AND TREATMENT: Rehabilitation  THERAPY DIAG: Spinal stenosis of lumbar region with neurogenic claudication  Difficulty in walking, not elsewhere classified  Muscle weakness (generalized)  ONSET DATE: Since Jan 2023 during which pt had L TKA completed   FOLLOW-UP APPT SCHEDULED WITH REFERRING PROVIDER: Yes ; pt is awaiting L-spine MRI and further f/u with Kristopher Boys, PA-C  PERTINENT HISTORY: Pt reports primary c/o weakness in his legs. Pt had previous discectomy at L5-S1 in 1993. Weakness in legs is associated with neurogenic claudication  per patient. Leg weakness resolves with sitting. Patient reports notable fatigue in back versus pain.   [name] has a history of afib/aflutter, HTN, CHF, PAD, DM with neuropathy, hyperlipidemia, obesity, CKD, parkinson's. He sees rheumatology for inflammatory arthritis; pt denies Hx of RA. History of lumbar laminectomy in 1993. He did well after this surgery.    He has intermittent LBP with weakness in both legs with standing and walking. He feels unsteady when walking- he is easily fatigues. He has known neuropathy with numbness in his feet. Hx of L great toe amputation without any prosthesis donned at this time. He has no pain with sitting. He does better with grocery cart.    History of bilateral TKA - had complications with left one; patellar component had to be revised. Pt reports having ongoing issues with L knee, but not so much with RLE. He reports having difficulty with getting unsteady on his feet. Pt reports notable fatigue affecting bilateral hamstrings and down to ankles. Pt occasionally uses cane/walking stick. Pt has to lean forward when he walks   PAIN:    Pt states pain is not primary problem Numbness/Tingling: Yes; bilateral feet Focal Weakness: Yes; hamstrings/calf muscles Aggravating factors: standing, walking, exertion (e.g. making bed) Relieving factors: sitting down, leaning onto cart/pitching trunk forward 24-hour pain behavior: None How long can you stand: Pt can walk up to 150 yards in his park History of prior back injury, pain, surgery, or therapy: Yes; previous microdiscectomy; pt had PT for back many years ago at [facility] Imaging: Yes ; L-spine MRI 12/29/23  IMPRESSION: 1. 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 Recommend adrenal protocol MRI with and without contrast for further evaluation. 2. Spondylosis appears worst at L4-5 where there is moderate to moderately severe central canal stenosis and narrowing of  both lateral recesses.   Red flags: Negative for bowel/bladder changes, saddle paresthesia, personal history of cancer, h/o spinal tumors, h/o compression fx, h/o abdominal aneurysm, abdominal pain, chills/fever, night sweats, nausea, vomiting, unrelenting pain, first onset of insidious LBP <18 y/o  -increased urinary frequency, has abated somewhat - with diuretic  PRECAUTIONS: None  WEIGHT BEARING RESTRICTIONS: No  FALLS: Has patient fallen in last 6 months? No  -one fall > 6 months ago; heel caught step above him while pt was carrying bags  Living Environment Lives with: lives with their spouse, spouse is working during the day; pt is semi-retired Lives in: Mobile home, pt takes golf cart to bring trash down to [employer]: Yes: External: 3 steps; can reach both Has following equipment at home: Single point cane  Prior level of function: Independent  Occupational demands: Semi-retired; part-time IT work  Hobbies: Able to return to golfing   Patient Goals: Will PT help with numbness in legs/pain    OBJECTIVE (data from initial evaluation unless otherwise dated):   Patient Surveys  Modified Oswestry:  MODIFIED OSWESTRY DISABILITY SCALE  Date: 01/05/24 Score  Pain intensity 1 = The pain is bad, but I can manage without having to  take (1) I can stand as long as I want but, it increases my pain. pain medication.  2. Personal care (washing, dressing, etc.) 0 =  I can take care of myself normally without causing increased pain.  3. Lifting 4 = I can lift only very light weights  4. Walking 2 =  Pain prevents me from walking more than  mile.  5. Sitting 0 =  I can sit in any chair as long as I like.  6. Standing 4 =  Pain prevents me from standing more than 10 minutes.  7. Sleeping 0 = Pain does not prevent me from sleeping well.  8. Social Life 2 = Pain prevents me from participating in more energetic activities (eg. sports, dancing).  9. Traveling 0 =  I can travel  anywhere without increased pain.  10. Employment/ Homemaking 0 = My normal homemaking/job activities do not cause pain.  Total 13/50 = 26%    GAIT: Distance walked: 40 ft  Assistive device utilized: None Level of assistance: SBA Comments: Dec arm swing, shortened stride length, L antalgic pattern with dec stance time  Posture: Lumbar lordosis: Decreased Iliac crest height: Equal bilaterally Lumbar lateral shift: Negative  AROM AROM (Normal range in degrees) AROM  01/05/24  Lumbar   Flexion (65) 75% (tight in hamstrings)  Extension (30) 25%  Right lateral flexion (25) 75%  Left lateral flexion (25) 75%  Right rotation (30) WNL  Left rotation (30) 75% (tightness in L flank region)      Hip Right Left  Flexion (125)    Extension (15)    Abduction (40)    Adduction     Internal Rotation (45)    External Rotation (45)        Knee    Flexion (135)    Extension (0)        Ankle    Dorsiflexion (20)    Plantarflexion (50)    Inversion (35)    Eversion (15)    (* = pain; Blank rows = not tested)  LE MMT: MMT (out of 5) Right 01/05/24 Left 01/05/24  Hip flexion 4- 3+  Hip extension (updated 01/07/24) 4- 4-  Hip abduction (updated 01/07/24) 5 seated 4- sidelying 5 seated 4- sidelying  Hip adduction 4+ 4+  Hip internal rotation    Hip external rotation    Knee flexion 5 5-  Knee extension 4+ 4+  Ankle dorsiflexion 4 4  Ankle plantarflexion    Ankle inversion    Ankle eversion    (* = pain; Blank rows = not tested)  Sensation Decreased light touch sensation in stocking glove pattern in feet. Proprioception, stereognosis, and hot/cold testing deferred on this date.  Muscle Length Hamstrings: R: Positive L: Positive Ely (quadriceps): R: Not examined L: Not examined Thomas (hip flexors): R: Not examined L: Not examined   Palpation Not examined - no c/o pain Location Right Left         Lumbar paraspinals    Quadratus Lumborum    Gluteus Maximus    Gluteus  Medius    Deep hip external rotators    PSIS    Fortin's Area (SIJ)    Greater Trochanter    (Blank rows = not tested) Graded on 0-4 scale (0 = no pain, 1 = pain, 2 = pain with wincing/grimacing/flinching, 3 = pain with withdrawal, 4 = unwilling to allow palpation)   Special Tests Lumbar Radiculopathy and Discogenic: Centralization and Peripheralization (SN 92, -LR 0.12):  No notable symptoms in lying following repeated flexion in lying/double knee to chest Slump (SN 83, -LR 0.32): R: Negative L: Negative SLR (SN 92, -LR 0.29): R: Negative L:  Negative   Facet Joint: Extension-Rotation (SN 100, -LR 0.0): R: Negative L: Negative  Lumbar Foraminal Stenosis: Lumbar quadrant (SN 70): R: Negative L: Negative  Hip: FABER (SN 81): R: Not examined L: Not examined     TODAY'S TREATMENT: DATE: 01/19/2024   SUBJECTIVE STATEMENT:   Patient reports no major changes or new concerns since last visit. Patient has completed his imaging for abdomen and brain - renal mass appeared benign. Pt reports doing relatively well with HEP.    Therapeutic Exercise - for improved soft tissue flexibility and extensibility as needed for ROM, improved strength as needed to improve performance of CKC activities/functional movements   NuStep; Level 4, x 5 minutes - for improved soft tissue mobility and increased tissue temperature to improve muscle performance   -subjective gathered during this time  Standing march with 5-lb ankle weights; 2 x 15  3-way kick hip; 2 x 8 ea dir, bilat; with Red Tband at knees    PATIENT EDUCATION: Discussed alternatives for aerobic training at home/in patient's neighborhood and encouraged continued HEP.    *not today* Open book; 1 x 10 on ea side SLR; 1 x 10, 1 x 15 bilat Standing HR/TR, light touch on // bars; 2 x 15   Neuromuscular Re-education - for nervous system downregulation, gluteal musculature activation and exercises to promote LE kinetic chain  stability   Sidelying hip abduction; with 2-lb ankle weights; 2 x 10  Bridge with posterior pelvic tilt; 2 x 15   *next visit* Dying bug;   -performed with LE only with maintenance of PPT/abdominal brace; 1 x 15 alt R/L   Therapeutic Activities - patient education, repetitive task practice for improved performance of daily functional activities e.g. transferring  Forward step up; 6-inch step; 2 x 12 per LE  Sit to stand from standard chair today; 2 x 8  -reviewed for HEP to determine technique to allow best carryover for pt at home  Minisquat standing adjacent to treadmill armrest;  1 x 15;  Minisquat with 6-lb Goblet hold  1 x 15;     PATIENT EDUCATION:  Education details: see above for patient education details Person educated: Patient Education method: Explanation, Demonstration, and Handouts Education comprehension: verbalized understanding and returned demonstration   HOME EXERCISE PROGRAM:  Access Code: [email or web address] URL: [email or web address] Date: 01/07/2024 Prepared by: Venetia Endo  Exercises - Supine Double Knee to Chest  - 3 x daily - 7 x weekly - 2 sets - 10 reps - 1-2sec hold - Seated Lumbar Flexion Stretch  - 3 x daily - 7 x weekly - 2 sets - 10 reps - 1-2sec hold - Seated Hamstring Stretch  - 2 x daily - 7 x weekly - 3 sets - 30sec hold - Sit to Stand with Armchair  - 1 x daily - 7 x weekly - 2-3 sets - 5-10 reps - Supine Active Straight Leg Raise  - 1 x daily - 7 x weekly - 2-3 sets - 12-15 reps - Supine Bridge  - 1 x daily - 7 x weekly - 2-3 sets - 10-12 reps - 2sec hold - Heel Toe Raises with Unilateral Counter Support  - 1 x daily - 7 x weekly - 2-3 sets - 10 reps - 15 hold   ASSESSMENT:  CLINICAL IMPRESSION: Patient is able  to markedly increase volume of standing/weightbearing activity with less-frequent sitting breaks, and he exhibits improving capacity for completing sit to stand indicative of improved functional LE strength. We  are continuing work on gradual increases in intensity and volume with focus on muscular endurance for much of patient's program. We discussed strategies for working on regular aerobic activity and gradually increasing walking volume in his neighborhood. Pt has current deficits in: LE strength, posterior chain flexibility/hamstrings length, decreased thoracolumbar AROM, decreased positional tolerance for standing/walking. Pt will continue to benefit from skilled PT services to address deficits and improve function.  OBJECTIVE IMPAIRMENTS: Abnormal gait, difficulty walking, decreased ROM, decreased strength, hypomobility, impaired flexibility, postural dysfunction, and pain.   ACTIVITY LIMITATIONS: carrying, lifting, standing, transfers, reach over head, and locomotion level  PARTICIPATION LIMITATIONS: meal prep, cleaning, shopping, community activity, and taking out trash in his neighborhood  PERSONAL FACTORS: Age, Time since onset of injury/illness/exacerbation, and 3+ comorbidities: (chronic neuropathy, PAD, HTN, A-fib, Hx of L great toe amputation, HLD) are also affecting patient's functional outcome.   REHAB POTENTIAL: Good  CLINICAL DECISION MAKING: Evolving/moderate complexity  EVALUATION COMPLEXITY: Moderate   GOALS: Goals reviewed with patient? No  SHORT TERM GOALS: Target date: 01/28/2024  Pt will be independent with HEP in order to improve strength and decrease back pain to improve pain-free function at home and work. Baseline: 01/05/24: Baseline HEP initiated.  Goal status: INITIAL   LONG TERM GOALS: Target date: 02/18/2024  Pt will have MMT 4+/5 for all tested LE musculature indicative of increased strength as needed for improved mm endurance and ability to complete prolonged weightbearing tasks Baseline:  Goal status: INITIAL  2.  Pt will complete standing/walking bouts up to 30 mins without claudication symptoms > 1-2/10 as needed for completing community outings, social  outings, and errands. Baseline: 01/05/24: Difficulty walking 150 yards to get around neighborhood/park Goal status: INITIAL  3.  Pt will decrease mODI score by at least 13 points in order demonstrate clinically significant reduction in back pain/disability.       Baseline: 01/05/24: 13/50 = 26% Goal status: INITIAL  4.  Pt will be compliant and independent with advanced HEP as needed for maintaining long-term level of function.  Baseline: 01/05/24: Baseline HEP initiated Goal status: INITIAL   PLAN: PT FREQUENCY: 1-2x/week  PT DURATION: 6 weeks  PLANNED INTERVENTIONS: Therapeutic exercises, Therapeutic activity, Neuromuscular re-education, Balance training, Gait training, Patient/Family education, Self Care, Joint mobilization, Joint manipulation, Vestibular training, Canalith repositioning, Orthotic/Fit training, DME instructions, Dry Needling, Electrical stimulation, Spinal manipulation, Spinal mobilization, Cryotherapy, Moist heat, Taping, Traction, Ultrasound, Ionotophoresis 4mg /ml Dexamethasone , Manual therapy, and Re-evaluation.  PLAN FOR NEXT SESSION: Continue with thoracolumbar mobility and progressive resistance drills with emphasis on endurance; progressive aerobic  exercise.    Venetia Endo, PT, DPT #[id number]  Venetia ONEIDA Endo, PT 01/19/2024, 8:26 AM

## 2024-01-19 ENCOUNTER — Ambulatory Visit: Admitting: Physical Therapy

## 2024-01-19 ENCOUNTER — Encounter: Payer: Self-pay | Admitting: Physical Therapy

## 2024-01-19 DIAGNOSIS — R262 Difficulty in walking, not elsewhere classified: Secondary | ICD-10-CM

## 2024-01-19 DIAGNOSIS — M48062 Spinal stenosis, lumbar region with neurogenic claudication: Secondary | ICD-10-CM | POA: Diagnosis not present

## 2024-01-19 DIAGNOSIS — M6281 Muscle weakness (generalized): Secondary | ICD-10-CM

## 2024-01-20 NOTE — Therapy (Unsigned)
 OUTPATIENT PHYSICAL THERAPY TREATMENT   Patient Name: Kristopher Oneal MRN: [id number] DOB:04-06-1951, 73 y.o., male Today's Date: 01/21/2024   END OF SESSION:  PT End of Session - 01/21/24 0731     Visit Number 6    Number of Visits 13    Date for Recertification  02/18/24    PT Start Time 0732    PT Stop Time 0819    PT Time Calculation (min) 47 min    Behavior During Therapy [facility] for tasks assessed/performed           Past Medical History:  Diagnosis Date   Anxiety    Arrhythmia    atrial fibrillation   Arthritis    CHF (congestive heart failure) (HCC)    Diabetes mellitus without complication (HCC)    type 2   Dysrhythmia    PVC/ a fib   Heart murmur    High cholesterol    Hypertension    Past Surgical History:  Procedure Laterality Date   AMPUTATION TOE Left 02/10/2023   Procedure: Left Hallux Amputation;  Surgeon: Neill Boas, DPM;  Location: [facility];  Service: Orthopedics/Podiatry;  Laterality: Left;   BACK SURGERY     l5/s1   COLONOSCOPY     KNEE ARTHROPLASTY Right 01/02/2020   Procedure: COMPUTER ASSISTED TOTAL KNEE ARTHROPLASTY;  Surgeon: Mardee Lynwood SQUIBB, MD;  Location: [facility];  Service: Orthopedics;  Laterality: Right;   KNEE ARTHROPLASTY Left 11/19/2020   Procedure: COMPUTER ASSISTED TOTAL KNEE ARTHROPLASTY;  Surgeon: Mardee Lynwood SQUIBB, MD;  Location: [facility];  Service: Orthopedics;  Laterality: Left;   LOWER EXTREMITY ANGIOGRAPHY Left 02/10/2023   Procedure: Lower Extremity Angiography;  Surgeon: Jama Cordella MATSU, MD;  Location: [facility];  Service: Cardiovascular;  Laterality: Left;   ORIF PATELLA Left 04/16/2021   Procedure: OPEN REDUCTION INTERNAL (ORIF) FIXATION PATELLA;  Surgeon: Mardee Lynwood SQUIBB, MD;  Location: [facility];  Service: Orthopedics;  Laterality: Left;   ORIF PATELLA Left 05/10/2021   Procedure: OPEN REDUCTION INTERNAL (ORIF) FIXATION PATELLA;  Surgeon: Mardee Lynwood SQUIBB, MD;  Location: [facility];  Service: Orthopedics;   Laterality: Left;   Patient Active Problem List   Diagnosis Date Noted   PAD (peripheral artery disease) 02/11/2023   Amputation of great toe 02/11/2023   Foot osteomyelitis, left (HCC) 02/05/2023   Inflammatory arthritis 02/05/2023   Left arm swelling 02/05/2023   Hypokalemia 12/03/2021   Acute CHF (HCC) 12/02/2021   Chronic atrial fibrillation (HCC) 12/02/2021   HTN (hypertension) 12/02/2021   HLD (hyperlipidemia) 12/02/2021   Diabetes mellitus without complication (HCC) 12/02/2021   Leukocytosis 12/02/2021   Obesity with body mass index (BMI) of 30.0 to 39.9 12/02/2021   Hypomagnesemia 12/02/2021   S/P ORIF (open reduction internal fixation) fracture 05/10/2021   Patella fracture 04/16/2021   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/19/2020   Status post total right knee replacement 01/18/2020   Atrial fibrillation and flutter (HCC) 07/09/2018   DM type 2 with diabetic mixed hyperlipidemia (HCC) 02/23/2018   B12 deficiency 07/17/2016   Benign essential hypertension 07/17/2016   Chronic painful diabetic neuropathy (HCC) 07/17/2016   Hyperlipidemia, mixed 07/17/2016   Symptomatic PVCs 07/17/2016    PCP: Kristopher Oneil FALCON, MD  REFERRING PROVIDER: Hilma Hastings, PA-C  REFERRING DIAG:  [phone] (ICD-10-CM) - Spinal stenosis of lumbar region with neurogenic claudication  M47.816 (ICD-10-CM) - Lumbar spondylosis    RATIONALE FOR EVALUATION AND TREATMENT: Rehabilitation  THERAPY DIAG: Spinal stenosis of lumbar region with neurogenic  claudication  Difficulty in walking, not elsewhere classified  Muscle weakness (generalized)  ONSET DATE: Since Jan 2023 during which pt had L TKA completed   FOLLOW-UP APPT SCHEDULED WITH REFERRING PROVIDER: Yes ; pt is awaiting L-spine MRI and further f/u with Glade Boys, PA-C  PERTINENT HISTORY: Pt reports primary c/o weakness in his legs. Pt had previous discectomy at L5-S1 in 1993. Weakness in legs is associated with neurogenic claudication  per patient. Leg weakness resolves with sitting. Patient reports notable fatigue in back versus pain.   [name] has a history of afib/aflutter, HTN, CHF, PAD, DM with neuropathy, hyperlipidemia, obesity, CKD, parkinson's. He sees rheumatology for inflammatory arthritis; pt denies Hx of RA. History of lumbar laminectomy in 1993. He did well after this surgery.    He has intermittent LBP with weakness in both legs with standing and walking. He feels unsteady when walking- he is easily fatigues. He has known neuropathy with numbness in his feet. Hx of L great toe amputation without any prosthesis donned at this time. He has no pain with sitting. He does better with grocery cart.    History of bilateral TKA - had complications with left one; patellar component had to be revised. Pt reports having ongoing issues with L knee, but not so much with RLE. He reports having difficulty with getting unsteady on his feet. Pt reports notable fatigue affecting bilateral hamstrings and down to ankles. Pt occasionally uses cane/walking stick. Pt has to lean forward when he walks   PAIN:    Pt states pain is not primary problem Numbness/Tingling: Yes; bilateral feet Focal Weakness: Yes; hamstrings/calf muscles Aggravating factors: standing, walking, exertion (e.g. making bed) Relieving factors: sitting down, leaning onto cart/pitching trunk forward 24-hour pain behavior: None How long can you stand: Pt can walk up to 150 yards in his park History of prior back injury, pain, surgery, or therapy: Yes; previous microdiscectomy; pt had PT for back many years ago at [place] [facility] Imaging: Yes ; L-spine MRI 12/29/23  IMPRESSION: 1. 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 Recommend adrenal protocol MRI with and without contrast for further evaluation. 2. Spondylosis appears worst at L4-5 where there is moderate to moderately severe central canal stenosis and narrowing of  both lateral recesses.   Red flags: Negative for bowel/bladder changes, saddle paresthesia, personal history of cancer, h/o spinal tumors, h/o compression fx, h/o abdominal aneurysm, abdominal pain, chills/fever, night sweats, nausea, vomiting, unrelenting pain, first onset of insidious LBP <18 y/o  -increased urinary frequency, has abated somewhat - with diuretic  PRECAUTIONS: None  WEIGHT BEARING RESTRICTIONS: No  FALLS: Has patient fallen in last 6 months? No  -one fall > 6 months ago; heel caught step above him while pt was carrying bags  Living Environment Lives with: lives with their spouse, spouse is working during the day; pt is semi-retired Lives in: Mobile home, pt takes golf cart to bring trash down to [employer]: Yes: External: 3 steps; can reach both Has following equipment at home: Single point cane  Prior level of function: Independent  Occupational demands: Semi-retired; part-time IT work  Hobbies: Able to return to golfing   Patient Goals: Will PT help with numbness in legs/pain    OBJECTIVE (data from initial evaluation unless otherwise dated):   Patient Surveys  Modified Oswestry:  MODIFIED OSWESTRY DISABILITY SCALE  Date: 01/05/24 Score  Pain intensity 1 = The pain is bad, but I can manage without  having to take (1) I can stand as long as I want but, it increases my pain. pain medication.  2. Personal care (washing, dressing, etc.) 0 =  I can take care of myself normally without causing increased pain.  3. Lifting 4 = I can lift only very light weights  4. Walking 2 =  Pain prevents me from walking more than  mile.  5. Sitting 0 =  I can sit in any chair as long as I like.  6. Standing 4 =  Pain prevents me from standing more than 10 minutes.  7. Sleeping 0 = Pain does not prevent me from sleeping well.  8. Social Life 2 = Pain prevents me from participating in more energetic activities (eg. sports, dancing).  9. Traveling 0 =  I can travel  anywhere without increased pain.  10. Employment/ Homemaking 0 = My normal homemaking/job activities do not cause pain.  Total 13/50 = 26%    GAIT: Distance walked: 40 ft  Assistive device utilized: None Level of assistance: SBA Comments: Dec arm swing, shortened stride length, L antalgic pattern with dec stance time  Posture: Lumbar lordosis: Decreased Iliac crest height: Equal bilaterally Lumbar lateral shift: Negative  AROM AROM (Normal range in degrees) AROM  01/05/24  Lumbar   Flexion (65) 75% (tight in hamstrings)  Extension (30) 25%  Right lateral flexion (25) 75%  Left lateral flexion (25) 75%  Right rotation (30) WNL  Left rotation (30) 75% (tightness in L flank region)      Hip Right Left  Flexion (125)    Extension (15)    Abduction (40)    Adduction     Internal Rotation (45)    External Rotation (45)        Knee    Flexion (135)    Extension (0)        Ankle    Dorsiflexion (20)    Plantarflexion (50)    Inversion (35)    Eversion (15)    (* = pain; Blank rows = not tested)  LE MMT: MMT (out of 5) Right 01/05/24 Left 01/05/24  Hip flexion 4- 3+  Hip extension (updated 01/07/24) 4- 4-  Hip abduction (updated 01/07/24) 5 seated 4- sidelying 5 seated 4- sidelying  Hip adduction 4+ 4+  Hip internal rotation    Hip external rotation    Knee flexion 5 5-  Knee extension 4+ 4+  Ankle dorsiflexion 4 4  Ankle plantarflexion    Ankle inversion    Ankle eversion    (* = pain; Blank rows = not tested)  Sensation Decreased light touch sensation in stocking glove pattern in feet. Proprioception, stereognosis, and hot/cold testing deferred on this date.  Muscle Length Hamstrings: R: Positive L: Positive Ely (quadriceps): R: Not examined L: Not examined Thomas (hip flexors): R: Not examined L: Not examined   Palpation Not examined - no c/o pain Location Right Left         Lumbar paraspinals    Quadratus Lumborum    Gluteus Maximus    Gluteus  Medius    Deep hip external rotators    PSIS    Fortin's Area (SIJ)    Greater Trochanter    (Blank rows = not tested) Graded on 0-4 scale (0 = no pain, 1 = pain, 2 = pain with wincing/grimacing/flinching, 3 = pain with withdrawal, 4 = unwilling to allow palpation)   Special Tests Lumbar Radiculopathy and Discogenic: Centralization and Peripheralization (SN 92, -  LR 0.12): No notable symptoms in lying following repeated flexion in lying/double knee to chest Slump (SN 83, -LR 0.32): R: Negative L: Negative SLR (SN 92, -LR 0.29): R: Negative L:  Negative   Facet Joint: Extension-Rotation (SN 100, -LR 0.0): R: Negative L: Negative  Lumbar Foraminal Stenosis: Lumbar quadrant (SN 70): R: Negative L: Negative  Hip: FABER (SN 81): R: Not examined L: Not examined     TODAY'S TREATMENT: DATE: 01/21/2024   SUBJECTIVE STATEMENT:   Patient reports going to grandson's football game and having to walk almost 1 mi to get to seat in stands. He walked to it without stopping, he had to grab tree branch and catch his breath when leaving/walking away from field.    Therapeutic Exercise - for improved soft tissue flexibility and extensibility as needed for ROM, improved strength as needed to improve performance of CKC activities/functional movements   NuStep; Level 4-5, x 5 minutes - for improved soft tissue mobility and increased tissue temperature to improve muscle performance   -subjective gathered during this time  Standing march with 5-lb ankle weights; 2 x 15  Dynamic march, to end of bars and back; 4x D/B   PATIENT EDUCATION: Discussed current condition and etiology of neurogenic claudication. Discussed strategies to address LE pain and answered pt questions regarding application of TENS unit.    *not today* 3-way kick hip; 2 x 8 ea dir, bilat; with Red Tband at knees  Open book; 1 x 10 on ea side SLR; 1 x 10, 1 x 15 bilat Standing HR/TR, light touch on // bars; 2 x  15   Neuromuscular Re-education - for nervous system downregulation, gluteal musculature activation and exercises to promote LE kinetic chain stability   Sidelying hip abduction; with 2-lb ankle weights; 2 x 10  Bridge with posterior pelvic tilt, with Blue TBand; 2 x 15  *not today* Dying bug;   -performed with LE only with maintenance of PPT/abdominal brace; 1 x 15 alt R/L    Therapeutic Activities - patient education, repetitive task practice for improved performance of daily functional activities e.g. transferring  Alternating lateral mini-lunge, // bars  -heavy demonstration/verbal cueing for technique, tactile cueing for trailing leg to remain in straight position  Forward step up; 6-inch step; 2 x 12 per LE  Sit to stand from chair + Airex; 1 x 12 Sit to stand from standard chair only; 1 x 5   -fatigue from previous set of sit-to-stand  Minisquat with 8-lb Goblet hold  2 x 12;    PATIENT EDUCATION:  Education details: see above for patient education details Person educated: Patient Education method: Explanation, Demonstration, and Handouts Education comprehension: verbalized understanding and returned demonstration   HOME EXERCISE PROGRAM:  Access Code: [email or web address] URL: [email or web address] Date: 01/07/2024 Prepared by: Venetia Endo  Exercises - Supine Double Knee to Chest  - 3 x daily - 7 x weekly - 2 sets - 10 reps - 1-2sec hold - Seated Lumbar Flexion Stretch  - 3 x daily - 7 x weekly - 2 sets - 10 reps - 1-2sec hold - Seated Hamstring Stretch  - 2 x daily - 7 x weekly - 3 sets - 30sec hold - Sit to Stand with Armchair  - 1 x daily - 7 x weekly - 2-3 sets - 5-10 reps - Supine Active Straight Leg Raise  - 1 x daily - 7 x weekly - 2-3 sets - 12-15 reps - Supine Bridge  - 1 x daily -  7 x weekly - 2-3 sets - 10-12 reps - 2sec hold - Heel Toe Raises with Unilateral Counter Support  - 1 x daily - 7 x weekly - 2-3 sets - 10 reps - 15  hold   ASSESSMENT:  CLINICAL IMPRESSION: Patient has demonstrated improved standing endurance with less rest time required with higher volume of standing activity performed consecutively in clinic. He has notable LE strength deficit as demonstrated by sit to stand limited rep count from standard chair. Pt fortunately has been able to walk for longer distance to attend grandson's football game. Pt has current deficits in: LE strength, posterior chain flexibility/hamstrings length, decreased thoracolumbar AROM, decreased positional tolerance for standing/walking. Pt will continue to benefit from skilled PT services to address deficits and improve function.  OBJECTIVE IMPAIRMENTS: Abnormal gait, difficulty walking, decreased ROM, decreased strength, hypomobility, impaired flexibility, postural dysfunction, and pain.   ACTIVITY LIMITATIONS: carrying, lifting, standing, transfers, reach over head, and locomotion level  PARTICIPATION LIMITATIONS: meal prep, cleaning, shopping, community activity, and taking out trash in his neighborhood  PERSONAL FACTORS: Age, Time since onset of injury/illness/exacerbation, and 3+ comorbidities: (chronic neuropathy, PAD, HTN, A-fib, Hx of L great toe amputation, HLD) are also affecting patient's functional outcome.   REHAB POTENTIAL: Good  CLINICAL DECISION MAKING: Evolving/moderate complexity  EVALUATION COMPLEXITY: Moderate   GOALS: Goals reviewed with patient? No  SHORT TERM GOALS: Target date: 01/28/2024  Pt will be independent with HEP in order to improve strength and decrease back pain to improve pain-free function at home and work. Baseline: 01/05/24: Baseline HEP initiated.  Goal status: INITIAL   LONG TERM GOALS: Target date: 02/18/2024  Pt will have MMT 4+/5 for all tested LE musculature indicative of increased strength as needed for improved mm endurance and ability to complete prolonged weightbearing tasks Baseline:  Goal status:  INITIAL  2.  Pt will complete standing/walking bouts up to 30 mins without claudication symptoms > 1-2/10 as needed for completing community outings, social outings, and errands. Baseline: 01/05/24: Difficulty walking 150 yards to get around neighborhood/park Goal status: INITIAL  3.  Pt will decrease mODI score by at least 13 points in order demonstrate clinically significant reduction in back pain/disability.       Baseline: 01/05/24: 13/50 = 26% Goal status: INITIAL  4.  Pt will be compliant and independent with advanced HEP as needed for maintaining long-term level of function.  Baseline: 01/05/24: Baseline HEP initiated Goal status: INITIAL   PLAN: PT FREQUENCY: 1-2x/week  PT DURATION: 6 weeks  PLANNED INTERVENTIONS: Therapeutic exercises, Therapeutic activity, Neuromuscular re-education, Balance training, Gait training, Patient/Family education, Self Care, Joint mobilization, Joint manipulation, Vestibular training, Canalith repositioning, Orthotic/Fit training, DME instructions, Dry Needling, Electrical stimulation, Spinal manipulation, Spinal mobilization, Cryotherapy, Moist heat, Taping, Traction, Ultrasound, Ionotophoresis 4mg /ml Dexamethasone , Manual therapy, and Re-evaluation.  PLAN FOR NEXT SESSION: Continue with thoracolumbar mobility and progressive resistance drills with emphasis on endurance; progressive aerobic  exercise.    Venetia Endo, PT, DPT #[id number]  Venetia ONEIDA Endo, PT 01/21/2024, 8:19 AM

## 2024-01-21 ENCOUNTER — Encounter: Payer: Self-pay | Admitting: Physical Therapy

## 2024-01-21 ENCOUNTER — Ambulatory Visit: Attending: Orthopedic Surgery | Admitting: Physical Therapy

## 2024-01-21 DIAGNOSIS — M48062 Spinal stenosis, lumbar region with neurogenic claudication: Secondary | ICD-10-CM | POA: Diagnosis present

## 2024-01-21 DIAGNOSIS — M6281 Muscle weakness (generalized): Secondary | ICD-10-CM | POA: Insufficient documentation

## 2024-01-21 DIAGNOSIS — R262 Difficulty in walking, not elsewhere classified: Secondary | ICD-10-CM | POA: Insufficient documentation

## 2024-01-26 ENCOUNTER — Ambulatory Visit: Admitting: Physical Therapy

## 2024-01-27 NOTE — Therapy (Unsigned)
 OUTPATIENT PHYSICAL THERAPY TREATMENT   Patient Name: Kristopher Oneal MRN: [id number] DOB:20-Oct-1950, 73 y.o., male Today's Date: 01/28/2024   END OF SESSION:  PT End of Session - 01/28/24 0738     Visit Number 7    Number of Visits 13    Date for Recertification  02/18/24    PT Start Time 0733    PT Stop Time 0816    PT Time Calculation (min) 43 min    Behavior During Therapy [facility] for tasks assessed/performed          Past Medical History:  Diagnosis Date   Anxiety    Arrhythmia    atrial fibrillation   Arthritis    CHF (congestive heart failure) (HCC)    Diabetes mellitus without complication (HCC)    type 2   Dysrhythmia    PVC/ a fib   Heart murmur    High cholesterol    Hypertension    Past Surgical History:  Procedure Laterality Date   AMPUTATION TOE Left 02/10/2023   Procedure: Left Hallux Amputation;  Surgeon: Neill Boas, DPM;  Location: [facility];  Service: Orthopedics/Podiatry;  Laterality: Left;   BACK SURGERY     l5/s1   COLONOSCOPY     KNEE ARTHROPLASTY Right 01/02/2020   Procedure: COMPUTER ASSISTED TOTAL KNEE ARTHROPLASTY;  Surgeon: Mardee Lynwood SQUIBB, MD;  Location: [facility];  Service: Orthopedics;  Laterality: Right;   KNEE ARTHROPLASTY Left 11/19/2020   Procedure: COMPUTER ASSISTED TOTAL KNEE ARTHROPLASTY;  Surgeon: Mardee Lynwood SQUIBB, MD;  Location: [facility];  Service: Orthopedics;  Laterality: Left;   LOWER EXTREMITY ANGIOGRAPHY Left 02/10/2023   Procedure: Lower Extremity Angiography;  Surgeon: Jama Cordella MATSU, MD;  Location: [facility];  Service: Cardiovascular;  Laterality: Left;   ORIF PATELLA Left 04/16/2021   Procedure: OPEN REDUCTION INTERNAL (ORIF) FIXATION PATELLA;  Surgeon: Mardee Lynwood SQUIBB, MD;  Location: [facility];  Service: Orthopedics;  Laterality: Left;   ORIF PATELLA Left 05/10/2021   Procedure: OPEN REDUCTION INTERNAL (ORIF) FIXATION PATELLA;  Surgeon: Mardee Lynwood SQUIBB, MD;  Location: [facility];  Service: Orthopedics;   Laterality: Left;   Patient Active Problem List   Diagnosis Date Noted   PAD (peripheral artery disease) 02/11/2023   Amputation of great toe 02/11/2023   Foot osteomyelitis, left (HCC) 02/05/2023   Inflammatory arthritis 02/05/2023   Left arm swelling 02/05/2023   Hypokalemia 12/03/2021   Acute CHF (HCC) 12/02/2021   Chronic atrial fibrillation (HCC) 12/02/2021   HTN (hypertension) 12/02/2021   HLD (hyperlipidemia) 12/02/2021   Diabetes mellitus without complication (HCC) 12/02/2021   Leukocytosis 12/02/2021   Obesity with body mass index (BMI) of 30.0 to 39.9 12/02/2021   Hypomagnesemia 12/02/2021   S/P ORIF (open reduction internal fixation) fracture 05/10/2021   Patella fracture 04/16/2021   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/19/2020   Status post total right knee replacement 01/18/2020   Atrial fibrillation and flutter (HCC) 07/09/2018   DM type 2 with diabetic mixed hyperlipidemia (HCC) 02/23/2018   B12 deficiency 07/17/2016   Benign essential hypertension 07/17/2016   Chronic painful diabetic neuropathy (HCC) 07/17/2016   Hyperlipidemia, mixed 07/17/2016   Symptomatic PVCs 07/17/2016    PCP: Kristopher Oneil FALCON, MD  REFERRING PROVIDER: Hilma Hastings, PA-C  REFERRING DIAG:  [phone] (ICD-10-CM) - Spinal stenosis of lumbar region with neurogenic claudication  M47.816 (ICD-10-CM) - Lumbar spondylosis    RATIONALE FOR EVALUATION AND TREATMENT: Rehabilitation  THERAPY DIAG: Spinal stenosis of lumbar region with neurogenic claudication  Difficulty in walking, not elsewhere classified  Muscle weakness (generalized)  ONSET DATE: Since Jan 2023 during which pt had L TKA completed   FOLLOW-UP APPT SCHEDULED WITH REFERRING PROVIDER: Yes ; pt is awaiting L-spine MRI and further f/u with Kristopher Boys, PA-C  PERTINENT HISTORY: Pt reports primary c/o weakness in his legs. Pt had previous discectomy at L5-S1 in 1993. Weakness in legs is associated with neurogenic claudication  per patient. Leg weakness resolves with sitting. Patient reports notable fatigue in back versus pain.   [name] has a history of afib/aflutter, HTN, CHF, PAD, DM with neuropathy, hyperlipidemia, obesity, CKD, parkinson's. He sees rheumatology for inflammatory arthritis; pt denies Hx of RA. History of lumbar laminectomy in 1993. He did well after this surgery.    He has intermittent LBP with weakness in both legs with standing and walking. He feels unsteady when walking- he is easily fatigues. He has known neuropathy with numbness in his feet. Hx of L great toe amputation without any prosthesis donned at this time. He has no pain with sitting. He does better with grocery cart.    History of bilateral TKA - had complications with left one; patellar component had to be revised. Pt reports having ongoing issues with L knee, but not so much with RLE. He reports having difficulty with getting unsteady on his feet. Pt reports notable fatigue affecting bilateral hamstrings and down to ankles. Pt occasionally uses cane/walking stick. Pt has to lean forward when he walks   PAIN:    Pt states pain is not primary problem Numbness/Tingling: Yes; bilateral feet Focal Weakness: Yes; hamstrings/calf muscles Aggravating factors: standing, walking, exertion (e.g. making bed) Relieving factors: sitting down, leaning onto cart/pitching trunk forward 24-hour pain behavior: None How long can you stand: Pt can walk up to 150 yards in his park History of prior back injury, pain, surgery, or therapy: Yes; previous microdiscectomy; pt had PT for back many years ago at [facility] Imaging: Yes ; L-spine MRI 12/29/23  IMPRESSION: 1. 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 Recommend adrenal protocol MRI with and without contrast for further evaluation. 2. Spondylosis appears worst at L4-5 where there is moderate to moderately severe central canal stenosis and narrowing of  both lateral recesses.   Red flags: Negative for bowel/bladder changes, saddle paresthesia, personal history of cancer, h/o spinal tumors, h/o compression fx, h/o abdominal aneurysm, abdominal pain, chills/fever, night sweats, nausea, vomiting, unrelenting pain, first onset of insidious LBP <18 y/o  -increased urinary frequency, has abated somewhat - with diuretic  PRECAUTIONS: None  WEIGHT BEARING RESTRICTIONS: No  FALLS: Has patient fallen in last 6 months? No  -one fall > 6 months ago; heel caught step above him while pt was carrying bags  Living Environment Lives with: lives with their spouse, spouse is working during the day; pt is semi-retired Lives in: Mobile home, pt takes golf cart to bring trash down to [employer]: Yes: External: 3 steps; can reach both Has following equipment at home: Single point cane  Prior level of function: Independent  Occupational demands: Semi-retired; part-time IT work  Hobbies: Able to return to golfing   Patient Goals: Will PT help with numbness in legs/pain    OBJECTIVE (data from initial evaluation unless otherwise dated):   Patient Surveys  Modified Oswestry:  MODIFIED OSWESTRY DISABILITY SCALE  Date: 01/05/24 Score  Pain intensity 1 = The pain is bad, but I can manage without having to  take (1) I can stand as long as I want but, it increases my pain. pain medication.  2. Personal care (washing, dressing, etc.) 0 =  I can take care of myself normally without causing increased pain.  3. Lifting 4 = I can lift only very light weights  4. Walking 2 =  Pain prevents me from walking more than  mile.  5. Sitting 0 =  I can sit in any chair as long as I like.  6. Standing 4 =  Pain prevents me from standing more than 10 minutes.  7. Sleeping 0 = Pain does not prevent me from sleeping well.  8. Social Life 2 = Pain prevents me from participating in more energetic activities (eg. sports, dancing).  9. Traveling 0 =  I can travel  anywhere without increased pain.  10. Employment/ Homemaking 0 = My normal homemaking/job activities do not cause pain.  Total 13/50 = 26%    GAIT: Distance walked: 40 ft  Assistive device utilized: None Level of assistance: SBA Comments: Dec arm swing, shortened stride length, L antalgic pattern with dec stance time  Posture: Lumbar lordosis: Decreased Iliac crest height: Equal bilaterally Lumbar lateral shift: Negative  AROM AROM (Normal range in degrees) AROM  01/05/24  Lumbar   Flexion (65) 75% (tight in hamstrings)  Extension (30) 25%  Right lateral flexion (25) 75%  Left lateral flexion (25) 75%  Right rotation (30) WNL  Left rotation (30) 75% (tightness in L flank region)      Hip Right Left  Flexion (125)    Extension (15)    Abduction (40)    Adduction     Internal Rotation (45)    External Rotation (45)        Knee    Flexion (135)    Extension (0)        Ankle    Dorsiflexion (20)    Plantarflexion (50)    Inversion (35)    Eversion (15)    (* = pain; Blank rows = not tested)  LE MMT: MMT (out of 5) Right 01/05/24 Left 01/05/24  Hip flexion 4- 3+  Hip extension (updated 01/07/24) 4- 4-  Hip abduction (updated 01/07/24) 5 seated 4- sidelying 5 seated 4- sidelying  Hip adduction 4+ 4+  Hip internal rotation    Hip external rotation    Knee flexion 5 5-  Knee extension 4+ 4+  Ankle dorsiflexion 4 4  Ankle plantarflexion    Ankle inversion    Ankle eversion    (* = pain; Blank rows = not tested)  Sensation Decreased light touch sensation in stocking glove pattern in feet. Proprioception, stereognosis, and hot/cold testing deferred on this date.  Muscle Length Hamstrings: R: Positive L: Positive Ely (quadriceps): R: Not examined L: Not examined Thomas (hip flexors): R: Not examined L: Not examined   Palpation Not examined - no c/o pain Location Right Left         Lumbar paraspinals    Quadratus Lumborum    Gluteus Maximus    Gluteus  Medius    Deep hip external rotators    PSIS    Fortin's Area (SIJ)    Greater Trochanter    (Blank rows = not tested) Graded on 0-4 scale (0 = no pain, 1 = pain, 2 = pain with wincing/grimacing/flinching, 3 = pain with withdrawal, 4 = unwilling to allow palpation)   Special Tests Lumbar Radiculopathy and Discogenic: Centralization and Peripheralization (SN 92, -LR 0.12):  No notable symptoms in lying following repeated flexion in lying/double knee to chest Slump (SN 83, -LR 0.32): R: Negative L: Negative SLR (SN 92, -LR 0.29): R: Negative L:  Negative   Facet Joint: Extension-Rotation (SN 100, -LR 0.0): R: Negative L: Negative  Lumbar Foraminal Stenosis: Lumbar quadrant (SN 70): R: Negative L: Negative  Hip: FABER (SN 81): R: Not examined L: Not examined     TODAY'S TREATMENT: DATE: 01/28/2024   SUBJECTIVE STATEMENT:   Patient reports going to farm to pick apples and it was rough getting uphill to go onto farm. Patient reports feeling generally tired this AM. He reports compliance with HEP.    Therapeutic Exercise - for improved soft tissue flexibility and extensibility as needed for ROM, improved strength as needed to improve performance of CKC activities/functional movements   NuStep; Level 5, x 5 minutes - for improved soft tissue mobility and increased tissue temperature to improve muscle performance   -subjective gathered during this time  Dynamic march along // bars with 5-lb ankle weights; 6x D/B   PATIENT EDUCATION: Discussed current progress and role of PT.    *not today* Dynamic march, to end of bars and back; 4x D/B 3-way kick hip; 2 x 8 ea dir, bilat; with Red Tband at knees  Open book; 1 x 10 on ea side SLR; 1 x 10, 1 x 15 bilat Standing HR/TR, light touch on // bars; 2 x 15   Neuromuscular Re-education - for nervous system downregulation, gluteal musculature activation and exercises to promote LE kinetic chain stability   Sidelying hip abduction;  with 2-lb ankle weights; 2 x 10  Bridge with posterior pelvic tilt, with Black TBand; 2 x 15  *not today* Dying bug;   -performed with LE only with maintenance of PPT/abdominal brace; 1 x 15 alt R/L    Therapeutic Activities - patient education, repetitive task practice for improved performance of daily functional activities e.g. transferring  Alternating lateral mini-lunge, // bars; 2 x 12 alt R/L  -heavy demonstration/verbal cueing for technique, tactile cueing for trailing leg to remain in straight position  Forward step up; 6-inch step; 2 x 12 per LE  Sit to stand from standard chair only; 1 x 3   -notable fatigue, difficulty attaining full standing positon   Sit to stand from chair + Airex; 1 x 10  Minisquat with 8-lb Goblet hold  2 x 12;    PATIENT EDUCATION:  Education details: see above for patient education details Person educated: Patient Education method: Explanation, Demonstration, and Handouts Education comprehension: verbalized understanding and returned demonstration   HOME EXERCISE PROGRAM:  Access Code: [email or web address] URL: [email or web address] Date: 01/07/2024 Prepared by: Venetia Endo  Exercises - Supine Double Knee to Chest  - 3 x daily - 7 x weekly - 2 sets - 10 reps - 1-2sec hold - Seated Lumbar Flexion Stretch  - 3 x daily - 7 x weekly - 2 sets - 10 reps - 1-2sec hold - Seated Hamstring Stretch  - 2 x daily - 7 x weekly - 3 sets - 30sec hold - Sit to Stand with Armchair  - 1 x daily - 7 x weekly - 2-3 sets - 5-10 reps - Supine Active Straight Leg Raise  - 1 x daily - 7 x weekly - 2-3 sets - 12-15 reps - Supine Bridge  - 1 x daily - 7 x weekly - 2-3 sets - 10-12 reps - 2sec hold - Heel Toe Raises with Unilateral Counter Support  -  1 x daily - 7 x weekly - 2-3 sets - 10 reps - 15 hold   ASSESSMENT:  CLINICAL IMPRESSION: Patient is able to continue with higher volume of consecutive standing drills and further increasing volume of rep  schemes to increase muscular endurance and mm performance needed with transfers and ADLs. Pt is still limited with sit to stand from standard-height chair (19-20). He is able to perform readily with chair + Airex (addition of 3 inches). Pt has current deficits in: LE strength, posterior chain flexibility/hamstrings length, decreased thoracolumbar AROM, decreased positional tolerance for standing/walking. Pt will continue to benefit from skilled PT services to address deficits and improve function.  OBJECTIVE IMPAIRMENTS: Abnormal gait, difficulty walking, decreased ROM, decreased strength, hypomobility, impaired flexibility, postural dysfunction, and pain.   ACTIVITY LIMITATIONS: carrying, lifting, standing, transfers, reach over head, and locomotion level  PARTICIPATION LIMITATIONS: meal prep, cleaning, shopping, community activity, and taking out trash in his neighborhood  PERSONAL FACTORS: Age, Time since onset of injury/illness/exacerbation, and 3+ comorbidities: (chronic neuropathy, PAD, HTN, A-fib, Hx of L great toe amputation, HLD) are also affecting patient's functional outcome.   REHAB POTENTIAL: Good  CLINICAL DECISION MAKING: Evolving/moderate complexity  EVALUATION COMPLEXITY: Moderate   GOALS: Goals reviewed with patient? No  SHORT TERM GOALS: Target date: 01/28/2024  Pt will be independent with HEP in order to improve strength and decrease back pain to improve pain-free function at home and work. Baseline: 01/05/24: Baseline HEP initiated.  Goal status: INITIAL   LONG TERM GOALS: Target date: 02/18/2024  Pt will have MMT 4+/5 for all tested LE musculature indicative of increased strength as needed for improved mm endurance and ability to complete prolonged weightbearing tasks Baseline:  Goal status: INITIAL  2.  Pt will complete standing/walking bouts up to 30 mins without claudication symptoms > 1-2/10 as needed for completing community outings, social outings, and  errands. Baseline: 01/05/24: Difficulty walking 150 yards to get around neighborhood/park Goal status: INITIAL  3.  Pt will decrease mODI score by at least 13 points in order demonstrate clinically significant reduction in back pain/disability.       Baseline: 01/05/24: 13/50 = 26% Goal status: INITIAL  4.  Pt will be compliant and independent with advanced HEP as needed for maintaining long-term level of function.  Baseline: 01/05/24: Baseline HEP initiated Goal status: INITIAL   PLAN: PT FREQUENCY: 1-2x/week  PT DURATION: 6 weeks  PLANNED INTERVENTIONS: Therapeutic exercises, Therapeutic activity, Neuromuscular re-education, Balance training, Gait training, Patient/Family education, Self Care, Joint mobilization, Joint manipulation, Vestibular training, Canalith repositioning, Orthotic/Fit training, DME instructions, Dry Needling, Electrical stimulation, Spinal manipulation, Spinal mobilization, Cryotherapy, Moist heat, Taping, Traction, Ultrasound, Ionotophoresis 4mg /ml Dexamethasone , Manual therapy, and Re-evaluation.  PLAN FOR NEXT SESSION: Continue with thoracolumbar mobility and progressive resistance drills with emphasis on endurance; progressive aerobic  exercise.    Venetia Endo, PT, DPT #[id number]  Venetia ONEIDA Endo, PT 01/28/2024, 8:34 AM

## 2024-01-28 ENCOUNTER — Encounter: Payer: Self-pay | Admitting: Physical Therapy

## 2024-01-28 ENCOUNTER — Ambulatory Visit: Admitting: Physical Therapy

## 2024-01-28 DIAGNOSIS — M48062 Spinal stenosis, lumbar region with neurogenic claudication: Secondary | ICD-10-CM

## 2024-01-28 DIAGNOSIS — R262 Difficulty in walking, not elsewhere classified: Secondary | ICD-10-CM

## 2024-01-28 DIAGNOSIS — M6281 Muscle weakness (generalized): Secondary | ICD-10-CM

## 2024-02-01 ENCOUNTER — Ambulatory Visit: Admitting: Physical Therapy

## 2024-02-01 NOTE — Progress Notes (Addendum)
 HPI: Kristopher Oneal is a 73 y.o. male who presents for follow up diabetes.  He was diagnosed with diabetes over 10 years ago. I last saw him in 5/25.  At that time,  I cut his evening dose of insulin  and increased his morning dose.     He is currently on glipizide  5 mg bid and MTF 1000 mg bid.  He is also taking Humulin  70/30 (pen) 60 units in the morning and 20 units at night.  He uses the libre3+ for CGM.  His 14 day average was 165.  His TIR is 68%.  Review of his diet shows that he avoids concentrated carbs.  His weight is up 7 lbs from last visit.  His exercise is limited due to his LTKR and previous patella frx.  He numbness in his feet and ankles.  He is concerned about his diabetes.     ROS:  No chest pain.  No shortness of breath.   Medical History: Past Medical History:  Diagnosis Date   A-fib (CMS/HHS-HCC)    Per patient   Benign essential hypertension 07/17/2016   Chronic atrial fibrillation (CMS/HHS-HCC)    Chronic painful diabetic neuropathy (CMS/HHS-HCC) 07/17/2016   Controlled type 2 diabetes mellitus with complication, with long-term current use of insulin  (CMS/HHS-HCC) 07/17/2016   Hyperlipidemia, mixed 07/17/2016   Symptomatic PVCs 07/17/2016    Surgical History: Past Surgical History:  Procedure Laterality Date   LAMINECTOMY LUMBAR SPINE  1993    Right total knee arthroplasty using computer-assisted navigation  01/02/2020   Dr Mardee   Left total knee arthroplasty using computer-assisted navigation  11/19/2020   Dr Mardee   Open reduction and internal fixation of the left periprosthetic patella fracture  04/16/2021   Dr Mardee   Open reduction and internal fixation of the left patella; removal of retained hardware  05/10/2021   Dr Mardee    Social History:  reports that he quit smoking about 45 years ago. His smoking use included cigarettes. He has been exposed to tobacco smoke. He has never used smokeless tobacco. He reports that he does not drink  alcohol and does not use drugs.  Family History: family history includes Alzheimer's disease in his mother; No Known Problems in his father.  Medications: Current Outpatient Medications  Medication Sig Dispense Refill   APPLE CIDER VINEGAR ORAL Take by mouth 2 (two) times daily     blood-glucose sensor (FREESTYLE LIBRE 3 PLUS SENSOR) Devi Use 1 each every 15 (fifteen) days 6 each 3   CARTIA  XT 300 mg CD capsule TAKE 1 CAPSULE BY MOUTH AT  BEDTIME 100 capsule 2   clopidogreL  (PLAVIX ) 75 mg tablet Take 75 mg by mouth daily with breakfast     cyanocobalamin  (VITAMIN B12) 500 MCG tablet Take 500 mcg by mouth once daily     ergocalciferol, vitamin D2, 1,250 mcg (50,000 unit) capsule Take 1 capsule (50,000 Units total) by mouth once a week for 8 doses 8 capsule 0   escitalopram  oxalate (LEXAPRO ) 10 MG tablet Take 1 tablet (10 mg total) by mouth every morning 90 tablet 3   fenofibrate  160 MG tablet TAKE 1 TABLET BY MOUTH ONCE  DAILY 100 tablet 2   glipiZIDE  (GLUCOTROL ) 5 MG tablet TAKE 1 TABLET BY MOUTH TWICE  DAILY 200 tablet 2   Herbal Supplement Herbal Name: CoQ10, Magnesium  ,D3, Potassium,Nitrous oxide. Melatonin     hydroxychloroquine  (PLAQUENIL ) 200 mg tablet Take 1 tablet (200 mg total) by mouth 2 (two) times daily 180  tablet 1   insulin  NPH-REGULAR (HUMULIN  70/30 U-100 KWIKPEN) 100 unit/mL (70-30) pen injector INJECT SUBCUTANEOUSLY 60 UNITS WITH BREAKFAST AND 15 UNITS WITH SUPPER 90 mL 3   lamoTRIgine  (LAMICTAL  XR) 50 mg XR tablet Take 1 tablet (50 mg total) by mouth 2 (two) times daily 60 tablet 5   lamoTRIgine  (LAMICTAL ) 25 MG tablet Take 1 tablet (25 mg total) by mouth as directed for up to 30 days 1 pill at night for 2 weeks then 2 pills at night for 2 weeks. 42 tablet 0   lovastatin  (MEVACOR ) 40 MG tablet TAKE 1 TABLET BY MOUTH DAILY  WITH DINNER 100 tablet 2   memantine  (NAMENDA ) 5 MG tablet Take 1 tablet (5 mg total) by mouth 2 (two) times daily 60 tablet 11    metFORMIN  (GLUCOPHAGE ) 1000 MG tablet Take 1 tablet (1,000 mg total) by mouth 2 (two) times daily 200 tablet 3   metoprolol  SUCCinate (TOPROL -XL) 100 MG XL tablet TAKE 1 TABLET BY MOUTH ONCE  DAILY 100 tablet 2   multivitamin tablet Take 1 tablet by mouth once daily For diabetics       triamterene -hydroCHLOROthiazide  (DYAZIDE ) 37.5-25 mg capsule Take 1 capsule by mouth once daily as needed     flash glucose sensor (FREESTYLE LIBRE 14 DAY SENSOR) kit USE SENSOR AND CHANGE EVERY 14  DAYS (Patient not taking: Reported on 02/01/2024) 6 kit 3   No current facility-administered medications for this visit.    Allergies: Allergies  Allergen Reactions   Gabapentin  Other (See Comments)    Confusion    Physical Exam: Vitals:   02/01/24 1424  BP: 130/70  Pulse: 90  SpO2: 95%  Weight: (!) 125.6 kg (277 lb)  Height: 188 cm (6' 2)     Body mass index is 35.56 kg/m. Gen: WDWN in NAD   Foot Exam:  Bilateral feet were examined with socks and shoes removed.  His 10g filament is intermittently abnormal bilaterally.  He has no ulcers.  Distal pulses intact. S/P partial left great toe amputation.   Physical exam otherwise deferred due to coronavirus precautions.   Labs: 10/11/2019:  A1c = 6.9.  01/11/2020:  A1c = 7.4.  B12 = 1268.   07/11/2020:  A1c = 8.2.  K/Cr/Ca = 4.5/1.3/9.8, eGFR = 34.  MA = 6.3.  Chol = 147/274/27.9/64.  LFTs nl.  TSH = 1.62.  B12 = 254.  10/23/2020:  A1c= 8.4.   11/14/2020:  Fructosamine = 260 (A1c ~ 6.1).   04/16/2021:  K/Cr/Ca = 4.2/1.07/9.3, eGFR < 60.  LFTs nl.  07/15/2021:  A1c = 7.3 10/14/2021:  A1c = 8.7.  K/Cr/Ca = 3.8/1.2/9.5.  Chol = 150/244/30/71.  LFTs nl.  TSH = 1.654.  B12 = 239.  11/13/2021:  MA = 22.3 12/10/2021:  K/Cr/Ca = 4.8/1.5/10.3, eGFR = 46.   05/12/2022:  A1c = 8.7 08/25/2022:  A1c = 7.8 12/03/2022:  A1c = 7.9.  K/Cr/Ca = 5.0/1.5/10.1, eGFR = 49.  MA = 20.9.  Chol = 113/191/33.1/42.  LFTs nl.  TSH = 1.54. 02/06/2023:  A1c = 7.8 02/11/2023:   K/Cr/Ca = 4.3/1.02/9.0.   07/24/2023:  A1c=7.6.  K/Cr/Ca= 4.8/1.6/9.6,  eGFR=45.  MA=11.0.  Chol= 127/142/31.2/67.  LFTs nl.  TSH=1.294.  PSA=0.63.  B12= 552.   01/13/24:  MR Abd with a large fat containing left adrenal adenoma measuring 4.1x4.0 cm.  01/26/2024: A1c = 7.6. K/Cr/Ca = 4.7/1.3/9.6. LFTs nl.  TSH = 2.072.  B12 = 318. D = 20.8.   Cortisol (9:11am)=13.6.  Aldosterone=5.8.  Assessment/Plan: 1.  Diabetes.  His A1c from a week ago was 7.6.  Based on review of his CGM, I will increase his insulin  65 with breakfast and 25 with supper. I told him if he starts having low sugars, to let me know and I will cut his insulin . We briefly discussed the OP5 pump today.  I told him to research it. Consider discussing more about it next visit.  I encouraged lifestyle modifications.    2.  HTN associated with diabetes.  His BP is great today on multiple agents though no ACE/ARB.  Consider adding one in the future if MA goes up.   3.  HLD associated with diabetes.  His LDL in 4/25 was 67 on fenofibrarte 160 mg and lovastatin  40 mg.   4.  Neuropathy.  He had an intermittently abnormal foot exam in 5/25.  He is currently on Gabapentin  900 mg daily.  5.  B12 deficiency.  His B12 was low in 3/22 and again in 6/23, but nl most recently in 10/25.  He is taking sublingual supplementation daily.   6.  Adrenal Adenoma.  He had a left adrenal adenoma found on imaging of his back.  It is fat containing and likely benign.  Will plan to add testing to his next set of morning labs (ACTH/Cort, Renin/aldo, Metanephs).  PCP has done some labs already.  7.  Retinopathy.  We last got a copy from the eye doctor (Dr. Myrna, [facility]) from 01/15/2024.  He had background retinopathy but no trx is necessary.  He is to f/u every 6 months.    7.  CVD.  He was admitted for CHF in 8/23.   He also had a LLE  angioplasty in 10/24.  He is being followed by cardiology and vascular surgery.  8.  Foot ulcer.   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  He was being followed by podiatry for it and last saw them in 12/24, and is now to follow up with them as needed.  He has had no ulcers from past foot exams. He says he has hammer toes and will make an appt tomorrow.   9.  Prophylaxis.  I did a foot exam in 5/25.   We last got a copy from the eye doctor in 9/25 as above.   10.  He will return to clinic in 4 months.    --Addendum:  Next pcp labs 08/15/24 so will order and link at next appt with me (acth/cort, renin/aldo, mets )  02/02/24:  Pt called and states would like to try Omnipod5.  Asked nurse to call and confirm if wants to stay with libre and use controller or switch to dexcom and use phone.  02/05/24:  Pt would like to continue libre. Sent OP5 to his [facility].  Sent with change every 48 hrs due to insulin  volume.  If we get it approved, will need to change sensor to [place] 2+  This note is partially prepared by Earla Daria Messier, Scribe, in the presence of and acting as the scribe of Dr. Debby Breaker , MD.    [facility], MD

## 2024-02-01 NOTE — Therapy (Deleted)
 OUTPATIENT PHYSICAL THERAPY TREATMENT   Patient Name: Kristopher Oneal MRN: [id number] DOB:1951-03-24, 73 y.o., male Today's Date: 02/01/2024   END OF SESSION:    Past Medical History:  Diagnosis Date   Anxiety    Arrhythmia    atrial fibrillation   Arthritis    CHF (congestive heart failure) (HCC)    Diabetes mellitus without complication (HCC)    type 2   Dysrhythmia    PVC/ a fib   Heart murmur    High cholesterol    Hypertension    Past Surgical History:  Procedure Laterality Date   AMPUTATION TOE Left 02/10/2023   Procedure: Left Hallux Amputation;  Surgeon: Neill Boas, DPM;  Location: [facility];  Service: Orthopedics/Podiatry;  Laterality: Left;   BACK SURGERY     l5/s1   COLONOSCOPY     KNEE ARTHROPLASTY Right 01/02/2020   Procedure: COMPUTER ASSISTED TOTAL KNEE ARTHROPLASTY;  Surgeon: Mardee Lynwood SQUIBB, MD;  Location: [facility];  Service: Orthopedics;  Laterality: Right;   KNEE ARTHROPLASTY Left 11/19/2020   Procedure: COMPUTER ASSISTED TOTAL KNEE ARTHROPLASTY;  Surgeon: Mardee Lynwood SQUIBB, MD;  Location: [facility];  Service: Orthopedics;  Laterality: Left;   LOWER EXTREMITY ANGIOGRAPHY Left 02/10/2023   Procedure: Lower Extremity Angiography;  Surgeon: Jama Cordella MATSU, MD;  Location: [facility];  Service: Cardiovascular;  Laterality: Left;   ORIF PATELLA Left 04/16/2021   Procedure: OPEN REDUCTION INTERNAL (ORIF) FIXATION PATELLA;  Surgeon: Mardee Lynwood SQUIBB, MD;  Location: [facility];  Service: Orthopedics;  Laterality: Left;   ORIF PATELLA Left 05/10/2021   Procedure: OPEN REDUCTION INTERNAL (ORIF) FIXATION PATELLA;  Surgeon: Mardee Lynwood SQUIBB, MD;  Location: [facility];  Service: Orthopedics;  Laterality: Left;   Patient Active Problem List   Diagnosis Date Noted   PAD (peripheral artery disease) 02/11/2023   Amputation of great toe 02/11/2023   Foot osteomyelitis, left (HCC) 02/05/2023   Inflammatory arthritis 02/05/2023   Left arm swelling 02/05/2023    Hypokalemia 12/03/2021   Acute CHF (HCC) 12/02/2021   Chronic atrial fibrillation (HCC) 12/02/2021   HTN (hypertension) 12/02/2021   HLD (hyperlipidemia) 12/02/2021   Diabetes mellitus without complication (HCC) 12/02/2021   Leukocytosis 12/02/2021   Obesity with body mass index (BMI) of 30.0 to 39.9 12/02/2021   Hypomagnesemia 12/02/2021   S/P ORIF (open reduction internal fixation) fracture 05/10/2021   Patella fracture 04/16/2021   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/19/2020   Status post total right knee replacement 01/18/2020   Atrial fibrillation and flutter (HCC) 07/09/2018   DM type 2 with diabetic mixed hyperlipidemia (HCC) 02/23/2018   B12 deficiency 07/17/2016   Benign essential hypertension 07/17/2016   Chronic painful diabetic neuropathy (HCC) 07/17/2016   Hyperlipidemia, mixed 07/17/2016   Symptomatic PVCs 07/17/2016    PCP: Cleotilde Oneil FALCON, MD  REFERRING PROVIDER: Hilma Hastings, PA-C  REFERRING DIAG:  [phone] (ICD-10-CM) - Spinal stenosis of lumbar region with neurogenic claudication  M47.816 (ICD-10-CM) - Lumbar spondylosis    RATIONALE FOR EVALUATION AND TREATMENT: Rehabilitation  THERAPY DIAG: Spinal stenosis of lumbar region with neurogenic claudication  Difficulty in walking, not elsewhere classified  Muscle weakness (generalized)  ONSET DATE: Since Jan 2023 during which pt had L TKA completed   FOLLOW-UP APPT SCHEDULED WITH REFERRING PROVIDER: Yes ; pt is awaiting L-spine MRI and further f/u with Hastings Hilma, PA-C  PERTINENT HISTORY: Pt reports primary c/o weakness in his legs. Pt had previous discectomy at L5-S1 in 1993. Weakness in legs is  associated with neurogenic claudication per patient. Leg weakness resolves with sitting. Patient reports notable fatigue in back versus pain.   Kristopher Oneal has a history of afib/aflutter, HTN, CHF, PAD, DM with neuropathy, hyperlipidemia, obesity, CKD, parkinson's. He sees rheumatology for inflammatory  arthritis; pt denies Hx of RA. History of lumbar laminectomy in 1993. He did well after this surgery.    He has intermittent LBP with weakness in both legs with standing and walking. He feels unsteady when walking- he is easily fatigues. He has known neuropathy with numbness in his feet. Hx of L great toe amputation without any prosthesis donned at this time. He has no pain with sitting. He does better with grocery cart.    History of bilateral TKA - had complications with left one; patellar component had to be revised. Pt reports having ongoing issues with L knee, but not so much with RLE. He reports having difficulty with getting unsteady on his feet. Pt reports notable fatigue affecting bilateral hamstrings and down to ankles. Pt occasionally uses cane/walking stick. Pt has to lean forward when he walks   PAIN:    Pt states pain is not primary problem Numbness/Tingling: Yes; bilateral feet Focal Weakness: Yes; hamstrings/calf muscles Aggravating factors: standing, walking, exertion (e.g. making bed) Relieving factors: sitting down, leaning onto cart/pitching trunk forward 24-hour pain behavior: None How long can you stand: Pt can walk up to 150 yards in his park History of prior back injury, pain, surgery, or therapy: Yes; previous microdiscectomy; pt had PT for back many years ago at [facility] Imaging: Yes ; L-spine MRI 12/29/23  IMPRESSION: 1. 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 Recommend adrenal protocol MRI with and without contrast for further evaluation. 2. 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   Red flags: Negative for bowel/bladder changes, saddle paresthesia, personal history of cancer, h/o spinal tumors, h/o compression fx, h/o abdominal aneurysm, abdominal pain, chills/fever, night sweats, nausea, vomiting, unrelenting pain, first  onset of insidious LBP <18 y/o  -increased urinary frequency, has abated somewhat - with diuretic  PRECAUTIONS: None  WEIGHT BEARING RESTRICTIONS: No  FALLS: Has patient fallen in last 6 months? No  -one fall > 6 months ago; heel caught step above him while pt was carrying bags  Living Environment Lives with: lives with their spouse, spouse is working during the day; pt is semi-retired Lives in: Mobile home, pt takes golf cart to bring trash down to [employer]: Yes: External: 3 steps; can reach both Has following equipment at home: Single point cane  Prior level of function: Independent  Occupational demands: Semi-retired; part-time IT work  Hobbies: Able to return to golfing   Patient Goals: Will PT help with numbness in legs/pain    OBJECTIVE (data from initial evaluation unless otherwise dated):   Patient Surveys  Modified Oswestry:  MODIFIED OSWESTRY DISABILITY SCALE  Date: 01/05/24 Score  Pain intensity 1 = The pain is bad, but I can manage without having to take (1) I can stand as long as I want but, it increases my pain. pain medication.  2. Personal care (washing, dressing, etc.) 0 =  I can take care of myself normally without causing increased pain.  3. Lifting 4 = I can lift only very light weights  4. Walking 2 =  Pain prevents me from walking more than  mile.  5. Sitting  0 =  I can sit in any chair as long as I like.  6. Standing 4 =  Pain prevents me from standing more than 10 minutes.  7. Sleeping 0 = Pain does not prevent me from sleeping well.  8. Social Life 2 = Pain prevents me from participating in more energetic activities (eg. sports, dancing).  9. Traveling 0 =  I can travel anywhere without increased pain.  10. Employment/ Homemaking 0 = My normal homemaking/job activities do not cause pain.  Total 13/50 = 26%    GAIT: Distance walked: 40 ft  Assistive device utilized: None Level of assistance: SBA Comments: Dec arm swing, shortened  stride length, L antalgic pattern with dec stance time  Posture: Lumbar lordosis: Decreased Iliac crest height: Equal bilaterally Lumbar lateral shift: Negative  AROM AROM (Normal range in degrees) AROM  01/05/24  Lumbar   Flexion (65) 75% (tight in hamstrings)  Extension (30) 25%  Right lateral flexion (25) 75%  Left lateral flexion (25) 75%  Right rotation (30) WNL  Left rotation (30) 75% (tightness in L flank region)      Hip Right Left  Flexion (125)    Extension (15)    Abduction (40)    Adduction     Internal Rotation (45)    External Rotation (45)        Knee    Flexion (135)    Extension (0)        Ankle    Dorsiflexion (20)    Plantarflexion (50)    Inversion (35)    Eversion (15)    (* = pain; Blank rows = not tested)  LE MMT: MMT (out of 5) Right 01/05/24 Left 01/05/24  Hip flexion 4- 3+  Hip extension (updated 01/07/24) 4- 4-  Hip abduction (updated 01/07/24) 5 seated 4- sidelying 5 seated 4- sidelying  Hip adduction 4+ 4+  Hip internal rotation    Hip external rotation    Knee flexion 5 5-  Knee extension 4+ 4+  Ankle dorsiflexion 4 4  Ankle plantarflexion    Ankle inversion    Ankle eversion    (* = pain; Blank rows = not tested)  Sensation Decreased light touch sensation in stocking glove pattern in feet. Proprioception, stereognosis, and hot/cold testing deferred on this date.  Muscle Length Hamstrings: R: Positive L: Positive Ely (quadriceps): R: Not examined L: Not examined Thomas (hip flexors): R: Not examined L: Not examined   Palpation Not examined - no c/o pain Location Right Left         Lumbar paraspinals    Quadratus Lumborum    Gluteus Maximus    Gluteus Medius    Deep hip external rotators    PSIS    Fortin's Area (SIJ)    Greater Trochanter    (Blank rows = not tested) Graded on 0-4 scale (0 = no pain, 1 = pain, 2 = pain with wincing/grimacing/flinching, 3 = pain with withdrawal, 4 = unwilling to allow  palpation)   Special Tests Lumbar Radiculopathy and Discogenic: Centralization and Peripheralization (SN 92, -LR 0.12): No notable symptoms in lying following repeated flexion in lying/double knee to chest Slump (SN 83, -LR 0.32): R: Negative L: Negative SLR (SN 92, -LR 0.29): R: Negative L:  Negative   Facet Joint: Extension-Rotation (SN 100, -LR 0.0): R: Negative L: Negative  Lumbar Foraminal Stenosis: Lumbar quadrant (SN 70): R: Negative L: Negative  Hip: FABER (SN 81): R: Not examined L: Not examined  TODAY'S TREATMENT: DATE: 02/01/2024   SUBJECTIVE STATEMENT:   Patient reports going to farm to pick apples and it was rough getting uphill to go onto farm. Patient reports feeling generally tired this AM. He reports compliance with HEP.    Therapeutic Exercise - for improved soft tissue flexibility and extensibility as needed for ROM, improved strength as needed to improve performance of CKC activities/functional movements   NuStep; Level 5, x 5 minutes - for improved soft tissue mobility and increased tissue temperature to improve muscle performance   -subjective gathered during this time  Dynamic march along // bars with 5-lb ankle weights; 6x D/B   PATIENT EDUCATION: Discussed current progress and role of PT.    *not today* Dynamic march, to end of bars and back; 4x D/B 3-way kick hip; 2 x 8 ea dir, bilat; with Red Tband at knees  Open book; 1 x 10 on ea side SLR; 1 x 10, 1 x 15 bilat Standing HR/TR, light touch on // bars; 2 x 15   Neuromuscular Re-education - for nervous system downregulation, gluteal musculature activation and exercises to promote LE kinetic chain stability   Sidelying hip abduction; with 2-lb ankle weights; 2 x 10  Bridge with posterior pelvic tilt, with Black TBand; 2 x 15  *not today* Dying bug;   -performed with LE only with maintenance of PPT/abdominal brace; 1 x 15 alt R/L    Therapeutic Activities - patient education,  repetitive task practice for improved performance of daily functional activities e.g. transferring  Alternating lateral mini-lunge, // bars; 2 x 12 alt R/L  -heavy demonstration/verbal cueing for technique, tactile cueing for trailing leg to remain in straight position  Forward step up; 6-inch step; 2 x 12 per LE  Sit to stand from standard chair only; 1 x 3   -notable fatigue, difficulty attaining full standing positon   Sit to stand from chair + Airex; 1 x 10  Minisquat with 8-lb Goblet hold  2 x 12;    PATIENT EDUCATION:  Education details: see above for patient education details Person educated: Patient Education method: Explanation, Demonstration, and Handouts Education comprehension: verbalized understanding and returned demonstration   HOME EXERCISE PROGRAM:  Access Code: [email or web address] URL: [email or web address] Date: 01/07/2024 Prepared by: Venetia Endo  Exercises - Supine Double Knee to Chest  - 3 x daily - 7 x weekly - 2 sets - 10 reps - 1-2sec hold - Seated Lumbar Flexion Stretch  - 3 x daily - 7 x weekly - 2 sets - 10 reps - 1-2sec hold - Seated Hamstring Stretch  - 2 x daily - 7 x weekly - 3 sets - 30sec hold - Sit to Stand with Armchair  - 1 x daily - 7 x weekly - 2-3 sets - 5-10 reps - Supine Active Straight Leg Raise  - 1 x daily - 7 x weekly - 2-3 sets - 12-15 reps - Supine Bridge  - 1 x daily - 7 x weekly - 2-3 sets - 10-12 reps - 2sec hold - Heel Toe Raises with Unilateral Counter Support  - 1 x daily - 7 x weekly - 2-3 sets - 10 reps - 15 hold   ASSESSMENT:  CLINICAL IMPRESSION: Patient is able to continue with higher volume of consecutive standing drills and further increasing volume of rep schemes to increase muscular endurance and mm performance needed with transfers and ADLs. Pt is still limited with sit to stand from standard-height chair (19-20). He is able to perform  readily with chair + Airex (addition of 3 inches). Pt has current  deficits in: LE strength, posterior chain flexibility/hamstrings length, decreased thoracolumbar AROM, decreased positional tolerance for standing/walking. Pt will continue to benefit from skilled PT services to address deficits and improve function.  OBJECTIVE IMPAIRMENTS: Abnormal gait, difficulty walking, decreased ROM, decreased strength, hypomobility, impaired flexibility, postural dysfunction, and pain.   ACTIVITY LIMITATIONS: carrying, lifting, standing, transfers, reach over head, and locomotion level  PARTICIPATION LIMITATIONS: meal prep, cleaning, shopping, community activity, and taking out trash in his neighborhood  PERSONAL FACTORS: Age, Time since onset of injury/illness/exacerbation, and 3+ comorbidities: (chronic neuropathy, PAD, HTN, A-fib, Hx of L great toe amputation, HLD) are also affecting patient's functional outcome.   REHAB POTENTIAL: Good  CLINICAL DECISION MAKING: Evolving/moderate complexity  EVALUATION COMPLEXITY: Moderate   GOALS: Goals reviewed with patient? No  SHORT TERM GOALS: Target date: 01/28/2024  Pt will be independent with HEP in order to improve strength and decrease back pain to improve pain-free function at home and work. Baseline: 01/05/24: Baseline HEP initiated.  Goal status: INITIAL   LONG TERM GOALS: Target date: 02/18/2024  Pt will have MMT 4+/5 for all tested LE musculature indicative of increased strength as needed for improved mm endurance and ability to complete prolonged weightbearing tasks Baseline:  Goal status: INITIAL  2.  Pt will complete standing/walking bouts up to 30 mins without claudication symptoms > 1-2/10 as needed for completing community outings, social outings, and errands. Baseline: 01/05/24: Difficulty walking 150 yards to get around neighborhood/park Goal status: INITIAL  3.  Pt will decrease mODI score by at least 13 points in order demonstrate clinically significant reduction in back pain/disability.        Baseline: 01/05/24: 13/50 = 26% Goal status: INITIAL  4.  Pt will be compliant and independent with advanced HEP as needed for maintaining long-term level of function.  Baseline: 01/05/24: Baseline HEP initiated Goal status: INITIAL   PLAN: PT FREQUENCY: 1-2x/week  PT DURATION: 6 weeks  PLANNED INTERVENTIONS: Therapeutic exercises, Therapeutic activity, Neuromuscular re-education, Balance training, Gait training, Patient/Family education, Self Care, Joint mobilization, Joint manipulation, Vestibular training, Canalith repositioning, Orthotic/Fit training, DME instructions, Dry Needling, Electrical stimulation, Spinal manipulation, Spinal mobilization, Cryotherapy, Moist heat, Taping, Traction, Ultrasound, Ionotophoresis 4mg /ml Dexamethasone , Manual therapy, and Re-evaluation.  PLAN FOR NEXT SESSION: Continue with thoracolumbar mobility and progressive resistance drills with emphasis on endurance; progressive aerobic  exercise.    Venetia Endo, PT, DPT #[id number]  Venetia ONEIDA Endo, PT 02/01/2024, 10:03 AM

## 2024-02-02 ENCOUNTER — Encounter: Admitting: Physical Therapy

## 2024-02-03 ENCOUNTER — Encounter: Payer: Self-pay | Admitting: Physical Therapy

## 2024-02-03 ENCOUNTER — Ambulatory Visit: Admitting: Physical Therapy

## 2024-02-03 DIAGNOSIS — M6281 Muscle weakness (generalized): Secondary | ICD-10-CM

## 2024-02-03 DIAGNOSIS — M48062 Spinal stenosis, lumbar region with neurogenic claudication: Secondary | ICD-10-CM

## 2024-02-03 DIAGNOSIS — R262 Difficulty in walking, not elsewhere classified: Secondary | ICD-10-CM

## 2024-02-03 NOTE — Therapy (Unsigned)
 OUTPATIENT PHYSICAL THERAPY TREATMENT   Patient Name: Kristopher Oneal MRN: [id number] DOB:27-Apr-1950, 73 y.o., male Today's Date: 02/03/2024   END OF SESSION:  PT End of Session - 02/03/24 1336     Visit Number 8    Number of Visits 13    Date for Recertification  02/18/24    PT Start Time 1330    PT Stop Time 1412    PT Time Calculation (min) 42 min    Behavior During Therapy [facility] for tasks assessed/performed           Past Medical History:  Diagnosis Date   Anxiety    Arrhythmia    atrial fibrillation   Arthritis    CHF (congestive heart failure) (HCC)    Diabetes mellitus without complication (HCC)    type 2   Dysrhythmia    PVC/ a fib   Heart murmur    High cholesterol    Hypertension    Past Surgical History:  Procedure Laterality Date   AMPUTATION TOE Left 02/10/2023   Procedure: Left Hallux Amputation;  Surgeon: Kristopher Oneal, DPM;  Location: [facility];  Service: Orthopedics/Podiatry;  Laterality: Left;   BACK SURGERY     l5/s1   COLONOSCOPY     KNEE ARTHROPLASTY Right 01/02/2020   Procedure: COMPUTER ASSISTED TOTAL KNEE ARTHROPLASTY;  Surgeon: Kristopher Lynwood SQUIBB, MD;  Location: [facility];  Service: Orthopedics;  Laterality: Right;   KNEE ARTHROPLASTY Left 11/19/2020   Procedure: COMPUTER ASSISTED TOTAL KNEE ARTHROPLASTY;  Surgeon: Kristopher Lynwood SQUIBB, MD;  Location: [facility];  Service: Orthopedics;  Laterality: Left;   LOWER EXTREMITY ANGIOGRAPHY Left 02/10/2023   Procedure: Lower Extremity Angiography;  Surgeon: Kristopher Cordella MATSU, MD;  Location: [facility];  Service: Cardiovascular;  Laterality: Left;   ORIF PATELLA Left 04/16/2021   Procedure: OPEN REDUCTION INTERNAL (ORIF) FIXATION PATELLA;  Surgeon: Kristopher Lynwood SQUIBB, MD;  Location: [facility];  Service: Orthopedics;  Laterality: Left;   ORIF PATELLA Left 05/10/2021   Procedure: OPEN REDUCTION INTERNAL (ORIF) FIXATION PATELLA;  Surgeon: Kristopher Lynwood SQUIBB, MD;  Location: [facility];  Service: Orthopedics;   Laterality: Left;   Patient Active Problem List   Diagnosis Date Noted   PAD (peripheral artery disease) 02/11/2023   Amputation of great toe 02/11/2023   Foot osteomyelitis, left (HCC) 02/05/2023   Inflammatory arthritis 02/05/2023   Left arm swelling 02/05/2023   Hypokalemia 12/03/2021   Acute CHF (HCC) 12/02/2021   Chronic atrial fibrillation (HCC) 12/02/2021   HTN (hypertension) 12/02/2021   HLD (hyperlipidemia) 12/02/2021   Diabetes mellitus without complication (HCC) 12/02/2021   Leukocytosis 12/02/2021   Obesity with body mass index (BMI) of 30.0 to 39.9 12/02/2021   Hypomagnesemia 12/02/2021   S/P ORIF (open reduction internal fixation) fracture 05/10/2021   Patella fracture 04/16/2021   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/19/2020   Status post total right knee replacement 01/18/2020   Atrial fibrillation and flutter (HCC) 07/09/2018   DM type 2 with diabetic mixed hyperlipidemia (HCC) 02/23/2018   B12 deficiency 07/17/2016   Benign essential hypertension 07/17/2016   Chronic painful diabetic neuropathy (HCC) 07/17/2016   Hyperlipidemia, mixed 07/17/2016   Symptomatic PVCs 07/17/2016    PCP: Kristopher Oneil FALCON, MD  REFERRING PROVIDER: Hilma Hastings, PA-C  REFERRING DIAG:  [phone] (ICD-10-CM) - Spinal stenosis of lumbar region with neurogenic claudication  M47.816 (ICD-10-CM) - Lumbar spondylosis    RATIONALE FOR EVALUATION AND TREATMENT: Rehabilitation  THERAPY DIAG: Spinal stenosis of lumbar region with neurogenic  claudication  Difficulty in walking, not elsewhere classified  Muscle weakness (generalized)  ONSET DATE: Since Jan 2023 during which pt had L TKA completed   FOLLOW-UP APPT SCHEDULED WITH REFERRING PROVIDER: Yes ; pt is awaiting L-spine MRI and further f/u with Kristopher Boys, PA-C  PERTINENT HISTORY: Pt reports primary c/o weakness in his legs. Pt had previous discectomy at L5-S1 in 1993. Weakness in legs is associated with neurogenic claudication  per patient. Leg weakness resolves with sitting. Patient reports notable fatigue in back versus pain.   [name] has a history of afib/aflutter, HTN, CHF, PAD, DM with neuropathy, hyperlipidemia, obesity, CKD, parkinson's. He sees rheumatology for inflammatory arthritis; pt denies Hx of RA. History of lumbar laminectomy in 1993. He did well after this surgery.    He has intermittent LBP with weakness in both legs with standing and walking. He feels unsteady when walking- he is easily fatigues. He has known neuropathy with numbness in his feet. Hx of L great toe amputation without any prosthesis donned at this time. He has no pain with sitting. He does better with grocery cart.    History of bilateral TKA - had complications with left one; patellar component had to be revised. Pt reports having ongoing issues with L knee, but not so much with RLE. He reports having difficulty with getting unsteady on his feet. Pt reports notable fatigue affecting bilateral hamstrings and down to ankles. Pt occasionally uses cane/walking stick. Pt has to lean forward when he walks   PAIN:    Pt states pain is not primary problem Numbness/Tingling: Yes; bilateral feet Focal Weakness: Yes; hamstrings/calf muscles Aggravating factors: standing, walking, exertion (e.g. making bed) Relieving factors: sitting down, leaning onto cart/pitching trunk forward 24-hour pain behavior: None How long can you stand: Pt can walk up to 150 yards in his park History of prior back injury, pain, surgery, or therapy: Yes; previous microdiscectomy; pt had PT for back many years ago at [facility] Imaging: Yes ; L-spine MRI 12/29/23  IMPRESSION: 1. 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 Recommend adrenal protocol MRI with and without contrast for further evaluation. 2. Spondylosis appears worst at L4-5 where there is moderate to moderately severe central canal stenosis and narrowing of  both lateral recesses.   Red flags: Negative for bowel/bladder changes, saddle paresthesia, personal history of cancer, h/o spinal tumors, h/o compression fx, h/o abdominal aneurysm, abdominal pain, chills/fever, night sweats, nausea, vomiting, unrelenting pain, first onset of insidious LBP <18 y/o  -increased urinary frequency, has abated somewhat - with diuretic  PRECAUTIONS: None  WEIGHT BEARING RESTRICTIONS: No  FALLS: Has patient fallen in last 6 months? No  -one fall > 6 months ago; heel caught step above him while pt was carrying bags  Living Environment Lives with: lives with their spouse, spouse is working during the day; pt is semi-retired Lives in: Mobile home, pt takes golf cart to bring trash down to [employer]: Yes: External: 3 steps; can reach both Has following equipment at home: Single point cane  Prior level of function: Independent  Occupational demands: Semi-retired; part-time IT work  Hobbies: Able to return to golfing   Patient Goals: Will PT help with numbness in legs/pain    OBJECTIVE (data from initial evaluation unless otherwise dated):   Patient Surveys  Modified Oswestry:  MODIFIED OSWESTRY DISABILITY SCALE  Date: 01/05/24 Score  Pain intensity 1 = The pain is bad, but I can manage without  having to take (1) I can stand as long as I want but, it increases my pain. pain medication.  2. Personal care (washing, dressing, etc.) 0 =  I can take care of myself normally without causing increased pain.  3. Lifting 4 = I can lift only very light weights  4. Walking 2 =  Pain prevents me from walking more than  mile.  5. Sitting 0 =  I can sit in any chair as long as I like.  6. Standing 4 =  Pain prevents me from standing more than 10 minutes.  7. Sleeping 0 = Pain does not prevent me from sleeping well.  8. Social Life 2 = Pain prevents me from participating in more energetic activities (eg. sports, dancing).  9. Traveling 0 =  I can travel  anywhere without increased pain.  10. Employment/ Homemaking 0 = My normal homemaking/job activities do not cause pain.  Total 13/50 = 26%    GAIT: Distance walked: 40 ft  Assistive device utilized: None Level of assistance: SBA Comments: Dec arm swing, shortened stride length, L antalgic pattern with dec stance time  Posture: Lumbar lordosis: Decreased Iliac crest height: Equal bilaterally Lumbar lateral shift: Negative  AROM AROM (Normal range in degrees) AROM  01/05/24  Lumbar   Flexion (65) 75% (tight in hamstrings)  Extension (30) 25%  Right lateral flexion (25) 75%  Left lateral flexion (25) 75%  Right rotation (30) WNL  Left rotation (30) 75% (tightness in L flank region)      Hip Right Left  Flexion (125)    Extension (15)    Abduction (40)    Adduction     Internal Rotation (45)    External Rotation (45)        Knee    Flexion (135)    Extension (0)        Ankle    Dorsiflexion (20)    Plantarflexion (50)    Inversion (35)    Eversion (15)    (* = pain; Blank rows = not tested)  LE MMT: MMT (out of 5) Right 01/05/24 Left 01/05/24  Hip flexion 4- 3+  Hip extension (updated 01/07/24) 4- 4-  Hip abduction (updated 01/07/24) 5 seated 4- sidelying 5 seated 4- sidelying  Hip adduction 4+ 4+  Hip internal rotation    Hip external rotation    Knee flexion 5 5-  Knee extension 4+ 4+  Ankle dorsiflexion 4 4  Ankle plantarflexion    Ankle inversion    Ankle eversion    (* = pain; Blank rows = not tested)  Sensation Decreased light touch sensation in stocking glove pattern in feet. Proprioception, stereognosis, and hot/cold testing deferred on this date.  Muscle Length Hamstrings: R: Positive L: Positive Ely (quadriceps): R: Not examined L: Not examined Thomas (hip flexors): R: Not examined L: Not examined   Palpation Not examined - no c/o pain Location Right Left         Lumbar paraspinals    Quadratus Lumborum    Gluteus Maximus    Gluteus  Medius    Deep hip external rotators    PSIS    Fortin's Area (SIJ)    Greater Trochanter    (Blank rows = not tested) Graded on 0-4 scale (0 = no pain, 1 = pain, 2 = pain with wincing/grimacing/flinching, 3 = pain with withdrawal, 4 = unwilling to allow palpation)   Special Tests Lumbar Radiculopathy and Discogenic: Centralization and Peripheralization (SN 92, -  LR 0.12): No notable symptoms in lying following repeated flexion in lying/double knee to chest Slump (SN 83, -LR 0.32): R: Negative L: Negative SLR (SN 92, -LR 0.29): R: Negative L:  Negative   Facet Joint: Extension-Rotation (SN 100, -LR 0.0): R: Negative L: Negative  Lumbar Foraminal Stenosis: Lumbar quadrant (SN 70): R: Negative L: Negative  Hip: FABER (SN 81): R: Not examined L: Not examined     TODAY'S TREATMENT: DATE: 02/03/2024   SUBJECTIVE STATEMENT:   Patient reports completing his walk around [place] park about 1/4 mile. Pt reports he will be going to [place] for long weekend; pt will be kayaking. Patient reports compliance with HEP. Patient reports doing better with transferring.    Therapeutic Exercise - for improved soft tissue flexibility and extensibility as needed for ROM, improved strength as needed to improve performance of CKC activities/functional movements   NuStep; Level 5, x 6 minutes - for improved soft tissue mobility and increased tissue temperature to improve muscle performance   -subjective gathered during this time  Dynamic march along // bars with 5-lb ankle weights; 6x D/B   PATIENT EDUCATION: Discussed current progress and role of PT.    *not today* Dynamic march, to end of bars and back; 4x D/B 3-way kick hip; 2 x 8 ea dir, bilat; with Red Tband at knees  Open book; 1 x 10 on ea side SLR; 1 x 10, 1 x 15 bilat Standing HR/TR, light touch on // bars; 2 x 15   Neuromuscular Re-education - for nervous system downregulation, gluteal musculature activation and exercises  to promote LE kinetic chain stability   Sidelying hip abduction; with 2-lb ankle weights; 2 x 10  Bridge with posterior pelvic tilt, with Black TBand; 2 x 15  *not today* Dying bug;   -performed with LE only with maintenance of PPT/abdominal brace; 1 x 15 alt R/L    Therapeutic Activities - patient education, repetitive task practice for improved performance of daily functional activities e.g. transferring   Forward step up; 6-inch step; 1 x 15 per LE  Sit to stand from standard chair only; 1 x 2   -notable fatigue, difficulty attaining full standing positon   Sit to stand from chair + Airex; 2 x 10  Minisquat with 8-lb Goblet hold  2 x 12;   SpO2: 88-90% after exertion, one incident of 86% SpO2   PATIENT EDUCATION: Discussed continued monitoring for O2 saturation at home with exertion and exercise.    *not today* Alternating lateral mini-lunge, // bars; 2 x 12 alt R/L  -heavy demonstration/verbal cueing for technique, tactile cueing for trailing leg to remain in straight position   PATIENT EDUCATION:  Education details: see above for patient education details Person educated: Patient Education method: Explanation, Demonstration, and Handouts Education comprehension: verbalized understanding and returned demonstration   HOME EXERCISE PROGRAM:  Access Code: [email or web address] URL: [email or web address] Date: 01/07/2024 Prepared by: Venetia Endo  Exercises - Supine Double Knee to Chest  - 3 x daily - 7 x weekly - 2 sets - 10 reps - 1-2sec hold - Seated Lumbar Flexion Stretch  - 3 x daily - 7 x weekly - 2 sets - 10 reps - 1-2sec hold - Seated Hamstring Stretch  - 2 x daily - 7 x weekly - 3 sets - 30sec hold - Sit to Stand with Armchair  - 1 x daily - 7 x weekly - 2-3 sets - 5-10 reps - Supine Active Straight Leg Raise  -  1 x daily - 7 x weekly - 2-3 sets - 12-15 reps - Supine Bridge  - 1 x daily - 7 x weekly - 2-3 sets - 10-12 reps - 2sec hold - Heel Toe  Raises with Unilateral Counter Support  - 1 x daily - 7 x weekly - 2-3 sets - 10 reps - 15 hold   ASSESSMENT:  CLINICAL IMPRESSION: Patient has ongoing challenge with sit to stand from standard-height chair; he is able to complete it at lower volume, but he is limited with multiple repetitions. He is continuing to progress with volume of standing exercise completed, and he reports upright/standing/ambulatory activities outside of clinic are getting easier. He is still limited with distance/duration of walking due to neurogenic claudication.  Pt has current deficits in: LE strength, posterior chain flexibility/hamstrings length, decreased thoracolumbar AROM, decreased positional tolerance for standing/walking. Pt will continue to benefit from skilled PT services to address deficits and improve function.  OBJECTIVE IMPAIRMENTS: Abnormal gait, difficulty walking, decreased ROM, decreased strength, hypomobility, impaired flexibility, postural dysfunction, and pain.   ACTIVITY LIMITATIONS: carrying, lifting, standing, transfers, reach over head, and locomotion level  PARTICIPATION LIMITATIONS: meal prep, cleaning, shopping, community activity, and taking out trash in his neighborhood  PERSONAL FACTORS: Age, Time since onset of injury/illness/exacerbation, and 3+ comorbidities: (chronic neuropathy, PAD, HTN, A-fib, Hx of L great toe amputation, HLD) are also affecting patient's functional outcome.   REHAB POTENTIAL: Good  CLINICAL DECISION MAKING: Evolving/moderate complexity  EVALUATION COMPLEXITY: Moderate   GOALS: Goals reviewed with patient? No  SHORT TERM GOALS: Target date: 01/28/2024  Pt will be independent with HEP in order to improve strength and decrease back pain to improve pain-free function at home and work. Baseline: 01/05/24: Baseline HEP initiated.  Goal status: INITIAL   LONG TERM GOALS: Target date: 02/18/2024  Pt will have MMT 4+/5 for all tested LE musculature  indicative of increased strength as needed for improved mm endurance and ability to complete prolonged weightbearing tasks Baseline:  Goal status: INITIAL  2.  Pt will complete standing/walking bouts up to 30 mins without claudication symptoms > 1-2/10 as needed for completing community outings, social outings, and errands. Baseline: 01/05/24: Difficulty walking 150 yards to get around neighborhood/park Goal status: INITIAL  3.  Pt will decrease mODI score by at least 13 points in order demonstrate clinically significant reduction in back pain/disability.       Baseline: 01/05/24: 13/50 = 26% Goal status: INITIAL  4.  Pt will be compliant and independent with advanced HEP as needed for maintaining long-term level of function.  Baseline: 01/05/24: Baseline HEP initiated Goal status: INITIAL   PLAN: PT FREQUENCY: 1-2x/week  PT DURATION: 6 weeks  PLANNED INTERVENTIONS: Therapeutic exercises, Therapeutic activity, Neuromuscular re-education, Balance training, Gait training, Patient/Family education, Self Care, Joint mobilization, Joint manipulation, Vestibular training, Canalith repositioning, Orthotic/Fit training, DME instructions, Dry Needling, Electrical stimulation, Spinal manipulation, Spinal mobilization, Cryotherapy, Moist heat, Taping, Traction, Ultrasound, Ionotophoresis 4mg /ml Dexamethasone , Manual therapy, and Re-evaluation.  PLAN FOR NEXT SESSION: Continue with thoracolumbar mobility and progressive resistance drills with emphasis on endurance; progressive aerobic  exercise.    Venetia Endo, PT, DPT #[id number]  Venetia ONEIDA Endo, PT 02/03/2024, 1:37 PM

## 2024-02-04 ENCOUNTER — Encounter: Admitting: Physical Therapy

## 2024-02-08 ENCOUNTER — Encounter: Admitting: Physical Therapy

## 2024-02-09 ENCOUNTER — Encounter: Admitting: Physical Therapy

## 2024-02-10 ENCOUNTER — Ambulatory Visit: Admitting: Physical Therapy

## 2024-02-10 DIAGNOSIS — M48062 Spinal stenosis, lumbar region with neurogenic claudication: Secondary | ICD-10-CM

## 2024-02-10 DIAGNOSIS — R262 Difficulty in walking, not elsewhere classified: Secondary | ICD-10-CM

## 2024-02-10 DIAGNOSIS — M6281 Muscle weakness (generalized): Secondary | ICD-10-CM

## 2024-02-10 NOTE — Therapy (Signed)
 OUTPATIENT PHYSICAL THERAPY TREATMENT   Patient Name: Kristopher Oneal MRN: [id number] DOB:12/16/1950, 73 y.o., male Today's Date: 02/10/2024   END OF SESSION:  PT End of Session - 02/10/24 1309     Visit Number 9    Number of Visits 13    Date for Recertification  02/18/24    PT Start Time 1322    PT Stop Time 1415    PT Time Calculation (min) 53 min    Activity Tolerance Patient tolerated treatment well    Behavior During Therapy [facility] for tasks assessed/performed           Past Medical History:  Diagnosis Date   Anxiety    Arrhythmia    atrial fibrillation   Arthritis    CHF (congestive heart failure) (HCC)    Diabetes mellitus without complication (HCC)    type 2   Dysrhythmia    PVC/ a fib   Heart murmur    High cholesterol    Hypertension    Past Surgical History:  Procedure Laterality Date   AMPUTATION TOE Left 02/10/2023   Procedure: Left Hallux Amputation;  Surgeon: Neill Boas, DPM;  Location: [facility] OR[facility];  Service: Orthopedics/Podiatry;  Laterality: Left;   BACK SURGERY     l5/s1   COLONOSCOPY     KNEE ARTHROPLASTY Right 01/02/2020   Procedure: COMPUTER ASSISTED TOTAL KNEE ARTHROPLASTY;  Surgeon: Mardee Lynwood SQUIBB, MD;  Location: [facility];  Service: Orthopedics;  Laterality: Right;   KNEE ARTHROPLASTY Left 11/19/2020   Procedure: COMPUTER ASSISTED TOTAL KNEE ARTHROPLASTY;  Surgeon: Mardee Lynwood SQUIBB, MD;  Location: [facility];  Service: Orthopedics;  Laterality: Left;   LOWER EXTREMITY ANGIOGRAPHY Left 02/10/2023   Procedure: Lower Extremity Angiography;  Surgeon: Jama Cordella MATSU, MD;  Location: [facility];  Service: Cardiovascular;  Laterality: Left;   ORIF PATELLA Left 04/16/2021   Procedure: OPEN REDUCTION INTERNAL (ORIF) FIXATION PATELLA;  Surgeon: Mardee Lynwood SQUIBB, MD;  Location: [facility];  Service: Orthopedics;  Laterality: Left;   ORIF PATELLA Left 05/10/2021   Procedure: OPEN REDUCTION INTERNAL (ORIF) FIXATION PATELLA;  Surgeon: Mardee Lynwood SQUIBB,  MD;  Location: [facility];  Service: Orthopedics;  Laterality: Left;   Patient Active Problem List   Diagnosis Date Noted   PAD (peripheral artery disease) 02/11/2023   Amputation of great toe 02/11/2023   Foot osteomyelitis, left (HCC) 02/05/2023   Inflammatory arthritis 02/05/2023   Left arm swelling 02/05/2023   Hypokalemia 12/03/2021   Acute CHF (HCC) 12/02/2021   Chronic atrial fibrillation (HCC) 12/02/2021   HTN (hypertension) 12/02/2021   HLD (hyperlipidemia) 12/02/2021   Diabetes mellitus without complication (HCC) 12/02/2021   Leukocytosis 12/02/2021   Obesity with body mass index (BMI) of 30.0 to 39.9 12/02/2021   Hypomagnesemia 12/02/2021   S/P ORIF (open reduction internal fixation) fracture 05/10/2021   Patella fracture 04/16/2021   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/19/2020   Status post total right knee replacement 01/18/2020   Atrial fibrillation and flutter (HCC) 07/09/2018   DM type 2 with diabetic mixed hyperlipidemia (HCC) 02/23/2018   B12 deficiency 07/17/2016   Benign essential hypertension 07/17/2016   Chronic painful diabetic neuropathy (HCC) 07/17/2016   Hyperlipidemia, mixed 07/17/2016   Symptomatic PVCs 07/17/2016    PCP: Cleotilde Oneil FALCON, MD  REFERRING PROVIDER: Hilma Hastings, PA-C  REFERRING DIAG:  [phone] (ICD-10-CM) - Spinal stenosis of lumbar region with neurogenic claudication  M47.816 (ICD-10-CM) - Lumbar spondylosis    RATIONALE FOR EVALUATION AND TREATMENT: Rehabilitation  THERAPY DIAG: Spinal stenosis of lumbar region with neurogenic claudication  Difficulty in walking, not elsewhere classified  Muscle weakness (generalized)  ONSET DATE: Since Jan 2023 during which pt had L TKA completed   FOLLOW-UP APPT SCHEDULED WITH REFERRING PROVIDER: Yes ; pt is awaiting L-spine MRI and further f/u with Glade Boys, PA-C  PERTINENT HISTORY: Pt reports primary c/o weakness in his legs. Pt had previous discectomy at L5-S1 in 1993. Weakness  in legs is associated with neurogenic claudication per patient. Leg weakness resolves with sitting. Patient reports notable fatigue in back versus pain.   [name] has a history of afib/aflutter, HTN, CHF, PAD, DM with neuropathy, hyperlipidemia, obesity, CKD, parkinson's. He sees rheumatology for inflammatory arthritis; pt denies Hx of RA. History of lumbar laminectomy in 1993. He did well after this surgery.    He has intermittent LBP with weakness in both legs with standing and walking. He feels unsteady when walking- he is easily fatigues. He has known neuropathy with numbness in his feet. Hx of L great toe amputation without any prosthesis donned at this time. He has no pain with sitting. He does better with grocery cart.    History of bilateral TKA - had complications with left one; patellar component had to be revised. Pt reports having ongoing issues with L knee, but not so much with RLE. He reports having difficulty with getting unsteady on his feet. Pt reports notable fatigue affecting bilateral hamstrings and down to ankles. Pt occasionally uses cane/walking stick. Pt has to lean forward when he walks   PAIN:    Pt states pain is not primary problem Numbness/Tingling: Yes; bilateral feet Focal Weakness: Yes; hamstrings/calf muscles Aggravating factors: standing, walking, exertion (e.g. making bed) Relieving factors: sitting down, leaning onto cart/pitching trunk forward 24-hour pain behavior: None How long can you stand: Pt can walk up to 150 yards in his park History of prior back injury, pain, surgery, or therapy: Yes; previous microdiscectomy; pt had PT for back many years ago at [facility] Imaging: Yes ; L-spine MRI 12/29/23  IMPRESSION: 1. 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 Recommend adrenal protocol MRI with and without contrast for further evaluation. 2. Spondylosis appears worst at L4-5 where there is moderate  to moderately severe central canal stenosis and narrowing of both lateral recesses.   Red flags: Negative for bowel/bladder changes, saddle paresthesia, personal history of cancer, h/o spinal tumors, h/o compression fx, h/o abdominal aneurysm, abdominal pain, chills/fever, night sweats, nausea, vomiting, unrelenting pain, first onset of insidious LBP <18 y/o  -increased urinary frequency, has abated somewhat - with diuretic  PRECAUTIONS: None  WEIGHT BEARING RESTRICTIONS: No  FALLS: Has patient fallen in last 6 months? No  -one fall > 6 months ago; heel caught step above him while pt was carrying bags  Living Environment Lives with: lives with their spouse, spouse is working during the day; pt is semi-retired Lives in: Mobile home, pt takes golf cart to bring trash down to [employer]: Yes: External: 3 steps; can reach both Has following equipment at home: Single point cane  Prior level of function: Independent  Occupational demands: Semi-retired; part-time IT work  Hobbies: Able to return to golfing   Patient Goals: Will PT help with numbness in legs/pain    OBJECTIVE (data from initial evaluation unless otherwise dated):   Patient Surveys  Modified Oswestry:  MODIFIED OSWESTRY DISABILITY SCALE  Date: 01/05/24 Score  Pain intensity 1 =  The pain is bad, but I can manage without having to take (1) I can stand as long as I want but, it increases my pain. pain medication.  2. Personal care (washing, dressing, etc.) 0 =  I can take care of myself normally without causing increased pain.  3. Lifting 4 = I can lift only very light weights  4. Walking 2 =  Pain prevents me from walking more than  mile.  5. Sitting 0 =  I can sit in any chair as long as I like.  6. Standing 4 =  Pain prevents me from standing more than 10 minutes.  7. Sleeping 0 = Pain does not prevent me from sleeping well.  8. Social Life 2 = Pain prevents me from participating in more energetic  activities (eg. sports, dancing).  9. Traveling 0 =  I can travel anywhere without increased pain.  10. Employment/ Homemaking 0 = My normal homemaking/job activities do not cause pain.  Total 13/50 = 26%    GAIT: Distance walked: 40 ft  Assistive device utilized: None Level of assistance: SBA Comments: Dec arm swing, shortened stride length, L antalgic pattern with dec stance time  Posture: Lumbar lordosis: Decreased Iliac crest height: Equal bilaterally Lumbar lateral shift: Negative  AROM AROM (Normal range in degrees) AROM  01/05/24  Lumbar   Flexion (65) 75% (tight in hamstrings)  Extension (30) 25%  Right lateral flexion (25) 75%  Left lateral flexion (25) 75%  Right rotation (30) WNL  Left rotation (30) 75% (tightness in L flank region)      Hip Right Left  Flexion (125)    Extension (15)    Abduction (40)    Adduction     Internal Rotation (45)    External Rotation (45)        Knee    Flexion (135)    Extension (0)        Ankle    Dorsiflexion (20)    Plantarflexion (50)    Inversion (35)    Eversion (15)    (* = pain; Blank rows = not tested)  LE MMT: MMT (out of 5) Right 01/05/24 Left 01/05/24  Hip flexion 4- 3+  Hip extension (updated 01/07/24) 4- 4-  Hip abduction (updated 01/07/24) 5 seated 4- sidelying 5 seated 4- sidelying  Hip adduction 4+ 4+  Hip internal rotation    Hip external rotation    Knee flexion 5 5-  Knee extension 4+ 4+  Ankle dorsiflexion 4 4  Ankle plantarflexion    Ankle inversion    Ankle eversion    (* = pain; Blank rows = not tested)  Sensation Decreased light touch sensation in stocking glove pattern in feet. Proprioception, stereognosis, and hot/cold testing deferred on this date.  Muscle Length Hamstrings: R: Positive L: Positive Ely (quadriceps): R: Not examined L: Not examined Thomas (hip flexors): R: Not examined L: Not examined   Palpation Not examined - no c/o pain Location Right Left         Lumbar  paraspinals    Quadratus Lumborum    Gluteus Maximus    Gluteus Medius    Deep hip external rotators    PSIS    Fortin's Area (SIJ)    Greater Trochanter    (Blank rows = not tested) Graded on 0-4 scale (0 = no pain, 1 = pain, 2 = pain with wincing/grimacing/flinching, 3 = pain with withdrawal, 4 = unwilling to allow palpation)   Special Tests  Lumbar Radiculopathy and Discogenic: Centralization and Peripheralization (SN 92, -LR 0.12): No notable symptoms in lying following repeated flexion in lying/double knee to chest Slump (SN 83, -LR 0.32): R: Negative L: Negative SLR (SN 92, -LR 0.29): R: Negative L:  Negative   Facet Joint: Extension-Rotation (SN 100, -LR 0.0): R: Negative L: Negative  Lumbar Foraminal Stenosis: Lumbar quadrant (SN 70): R: Negative L: Negative  Hip: FABER (SN 81): R: Not examined L: Not examined     TODAY'S TREATMENT: DATE: 02/10/2024   SUBJECTIVE STATEMENT:   Patient reports not having as much difficulty with LE fatigue with walking/standing tasks. He reports ongoing challenge with sit to stand, though this has improved.    Therapeutic Exercise - for improved soft tissue flexibility and extensibility as needed for ROM, improved strength as needed to improve performance of CKC activities/functional movements   NuStep; Level 5, x 6 minutes - for improved soft tissue mobility and increased tissue temperature to improve muscle performance   -subjective gathered during this time  Dynamic march along blue agility ladder with 5-lb ankle weights; 5x D/B    *not today* Dynamic march, to end of bars and back; 4x D/B 3-way kick hip; 2 x 8 ea dir, bilat; with Red Tband at knees  Open book; 1 x 10 on ea side SLR; 1 x 10, 1 x 15 bilat Standing HR/TR, light touch on // bars; 2 x 15   Neuromuscular Re-education - for nervous system downregulation, gluteal musculature activation and exercises to promote LE kinetic chain stability   Sidelying hip  abduction; with 4-lb ankle weights; 2 x 12  Bridge with posterior pelvic tilt, with Black TBand; 2 x 15   *not today* Dying bug;   -performed with LE only with maintenance of PPT/abdominal brace; 1 x 15 alt R/L    Therapeutic Activities - patient education, repetitive task practice for improved performance of daily functional activities e.g. transferring   Forward step up; 6-inch step; 1 x 15 per LE   Sit to stand from raised low mat (22 in height); 1x10, 1x9, 1x10  Minisquat with 8-lb Goblet hold  2 x 12;  Dynamic mini-lunge; in // bars; 3x D/B  SpO2: 94-96% after exertion, one incident of 85% SpO2  PATIENT EDUCATION: HEP updated and reviewed. Discussed current progress with PT and prognosis.    *not today* Alternating lateral mini-lunge, // bars; 2 x 12 alt R/L  -heavy demonstration/verbal cueing for technique, tactile cueing for trailing leg to remain in straight position   PATIENT EDUCATION:  Education details: see above for patient education details Person educated: Patient Education method: Explanation, Demonstration, and Handouts Education comprehension: verbalized understanding and returned demonstration   HOME EXERCISE PROGRAM:  Access Code: [email or web address] URL: [email or web address] Date: 02/10/2024 Prepared by: Venetia Endo  Exercises - Supine Double Knee to Chest  - 3 x daily - 7 x weekly - 2 sets - 10 reps - 1-2sec hold - Seated Lumbar Flexion Stretch  - 3 x daily - 7 x weekly - 2 sets - 10 reps - 1-2sec hold - Seated Hamstring Stretch  - 2 x daily - 7 x weekly - 3 sets - 30sec hold - Sit to Stand with Armchair  - 1 x daily - 7 x weekly - 2-3 sets - 5-10 reps - Supine Bridge with Resistance Band  - 1 x daily - 7 x weekly - 2-3 sets - 12-15 reps - Heel Toe Raises with Unilateral Counter Support  - 1 x daily -  7 x weekly - 2-3 sets - 10 reps - 15 hold - Forward Step Up  - 1 x daily - 5-7 x weekly - 2 sets - 12-15 reps - Standing Double Leg  Mini Squat  - 1 x daily - 5-7 x weekly - 2 sets - 10 reps - Lunge with Counter Support  - 1 x daily - 5-7 x weekly - 2 sets - 10 reps   ASSESSMENT:  CLINICAL IMPRESSION: Patient reports less LE fatigability and is able to continue progression of rep scheme and exercise intensity. Patient is most limited by deconditioning/aerobic endurance. He has antalgic pattern related to old L TKA with complications. We updated HEP to include further CKC strengthening and progressed set/rep scheme. His main remaining limitation is difficulty with sit to stand. Pt has current deficits in: LE strength, posterior chain flexibility/hamstrings length, decreased thoracolumbar AROM, decreased positional tolerance for standing/walking. Pt will continue to benefit from skilled PT services to address deficits and improve function.  OBJECTIVE IMPAIRMENTS: Abnormal gait, difficulty walking, decreased ROM, decreased strength, hypomobility, impaired flexibility, postural dysfunction, and pain.   ACTIVITY LIMITATIONS: carrying, lifting, standing, transfers, reach over head, and locomotion level  PARTICIPATION LIMITATIONS: meal prep, cleaning, shopping, community activity, and taking out trash in his neighborhood  PERSONAL FACTORS: Age, Time since onset of injury/illness/exacerbation, and 3+ comorbidities: (chronic neuropathy, PAD, HTN, A-fib, Hx of L great toe amputation, HLD) are also affecting patient's functional outcome.   REHAB POTENTIAL: Good  CLINICAL DECISION MAKING: Evolving/moderate complexity  EVALUATION COMPLEXITY: Moderate   GOALS: Goals reviewed with patient? No  SHORT TERM GOALS: Target date: 01/28/2024  Pt will be independent with HEP in order to improve strength and decrease back pain to improve pain-free function at home and work. Baseline: 01/05/24: Baseline HEP initiated.  Goal status: INITIAL   LONG TERM GOALS: Target date: 02/18/2024  Pt will have MMT 4+/5 for all tested LE musculature  indicative of increased strength as needed for improved mm endurance and ability to complete prolonged weightbearing tasks Baseline:  Goal status: INITIAL  2.  Pt will complete standing/walking bouts up to 30 mins without claudication symptoms > 1-2/10 as needed for completing community outings, social outings, and errands. Baseline: 01/05/24: Difficulty walking 150 yards to get around neighborhood/park Goal status: INITIAL  3.  Pt will decrease mODI score by at least 13 points in order demonstrate clinically significant reduction in back pain/disability.       Baseline: 01/05/24: 13/50 = 26% Goal status: INITIAL  4.  Pt will be compliant and independent with advanced HEP as needed for maintaining long-term level of function.  Baseline: 01/05/24: Baseline HEP initiated Goal status: INITIAL   PLAN: PT FREQUENCY: 1-2x/week  PT DURATION: 6 weeks  PLANNED INTERVENTIONS: Therapeutic exercises, Therapeutic activity, Neuromuscular re-education, Balance training, Gait training, Patient/Family education, Self Care, Joint mobilization, Joint manipulation, Vestibular training, Canalith repositioning, Orthotic/Fit training, DME instructions, Dry Needling, Electrical stimulation, Spinal manipulation, Spinal mobilization, Cryotherapy, Moist heat, Taping, Traction, Ultrasound, Ionotophoresis 4mg /ml Dexamethasone , Manual therapy, and Re-evaluation.  PLAN FOR NEXT SESSION: Continue with thoracolumbar mobility and progressive resistance drills with emphasis on endurance; progressive aerobic  exercise.    Venetia Endo, PT, DPT #[id number]  Venetia ONEIDA Endo, PT 02/10/2024, 2:17 PM

## 2024-02-11 ENCOUNTER — Encounter: Admitting: Physical Therapy

## 2024-02-15 ENCOUNTER — Encounter: Admitting: Physical Therapy

## 2024-02-16 ENCOUNTER — Encounter: Admitting: Physical Therapy

## 2024-02-17 ENCOUNTER — Ambulatory Visit: Admitting: Physical Therapy

## 2024-02-18 ENCOUNTER — Encounter: Admitting: Physical Therapy

## 2024-02-19 NOTE — H&P (View-Only) (Signed)
 Referring Physician:  Cleotilde Oneil FALCON, MD [address] [facility] [address] [facility] [place],  [place] [address]  Primary Physician:  Cleotilde Oneil FALCON, MD   Discussed the use of AI scribe software for clinical note transcription with the patient, who gave verbal consent to proceed.  History of Present Illness Kristopher Oneal is a 73 year old male with severe lumbar claudication who presents with ambulatory dysfunction and leg weakness. He is accompanied by his wife, who is also his caretaker. He was referred by Harlene for evaluation of his leg weakness and ambulatory dysfunction.  He experiences significant leg weakness, describing them as prone to giving out, especially during ambulation. He has difficulty climbing steps and feels exhausted after short distances. He often uses a riding cart or leans on a push cart when shopping. The weakness is associated with tiredness rather than pain, starting from the buttocks and extending downwards.  He has experienced falls, including a recent incident where he lost balance on steps. His gait is described as shuffling, and he feels unstable, particularly after prolonged walking. He notes improvement in rising from chairs without using his hands if the chair is not too low.  He underwent back surgery in 1993 at the L5-S1 level and a partial discectomy at L4-5. He has completed over twelve sessions of physical therapy and continues exercises at home. He has a history of knee problems and replacements, which have exacerbated his back issues.  Conservative measures:  Physical therapy: has not participated in (2024 PT after toe amputation) Multimodal medical therapy including regular antiinflammatories: nothing Injections:  no epidural steroid injections  Past Surgery:  Lumbar laminectomy in 1993 L5/S1   Kristopher Oneal has no symptoms of cervical myelopathy.  The symptoms are causing a significant impact on the patient's life.   Review of  Systems:  A 10 point review of systems is negative, except for the pertinent positives and negatives detailed in the HPI.  Past Medical History: Past Medical History:  Diagnosis Date   Anxiety    Arrhythmia    atrial fibrillation   Arthritis    CHF (congestive heart failure) (HCC)    Diabetes mellitus without complication (HCC)    type 2   Dysrhythmia    PVC/ a fib   Heart murmur    High cholesterol    Hypertension     Past Surgical History: Past Surgical History:  Procedure Laterality Date   AMPUTATION TOE Left 02/10/2023   Procedure: Left Hallux Amputation;  Surgeon: Neill Boas, DPM;  Location: [facility];  Service: Orthopedics/Podiatry;  Laterality: Left;   BACK SURGERY     l5/s1   COLONOSCOPY     KNEE ARTHROPLASTY Right 01/02/2020   Procedure: COMPUTER ASSISTED TOTAL KNEE ARTHROPLASTY;  Surgeon: Mardee Lynwood SQUIBB, MD;  Location: [facility];  Service: Orthopedics;  Laterality: Right;   KNEE ARTHROPLASTY Left 11/19/2020   Procedure: COMPUTER ASSISTED TOTAL KNEE ARTHROPLASTY;  Surgeon: Mardee Lynwood SQUIBB, MD;  Location: [facility];  Service: Orthopedics;  Laterality: Left;   LOWER EXTREMITY ANGIOGRAPHY Left 02/10/2023   Procedure: Lower Extremity Angiography;  Surgeon: Jama Cordella MATSU, MD;  Location: [facility];  Service: Cardiovascular;  Laterality: Left;   ORIF PATELLA Left 04/16/2021   Procedure: OPEN REDUCTION INTERNAL (ORIF) FIXATION PATELLA;  Surgeon: Mardee Lynwood SQUIBB, MD;  Location: [facility];  Service: Orthopedics;  Laterality: Left;   ORIF PATELLA Left 05/10/2021   Procedure: OPEN REDUCTION INTERNAL (ORIF) FIXATION PATELLA;  Surgeon: Mardee Lynwood SQUIBB,  MD;  Location: [facility];  Service: Orthopedics;  Laterality: Left;    Allergies: Allergies as of 12/31/2023 - Review Complete 12/31/2023  Allergen Reaction Noted   Gabapentin  Other (See Comments) 01/21/2022    Medications: Outpatient Encounter Medications as of 12/31/2023  Medication Sig   carbidopa-levodopa  (SINEMET IR) 25-100 MG tablet Take 1 tablet by mouth 2 (two) times daily.   clopidogrel  (PLAVIX ) 75 MG tablet Take 1 tablet (75 mg total) by mouth daily with breakfast.   Continuous Blood Gluc Sensor (FREESTYLE LIBRE 14 DAY SENSOR) MISC SMARTSIG:1 Kit(s) Topical Every 2 Weeks   diltiazem  (CARDIZEM  CD) 300 MG 24 hr capsule Take 1 capsule (300 mg total) by mouth at bedtime.   ELIQUIS  5 MG TABS tablet Take 1 tablet (5 mg total) by mouth 2 (two) times daily.   escitalopram (LEXAPRO) 10 MG tablet Take 10 mg by mouth daily.   fenofibrate  160 MG tablet Take 160 mg by mouth daily.    fluticasone  (FLONASE ) 50 MCG/ACT nasal spray Place 1 spray into the nose daily as needed for allergies.   glipiZIDE  (GLUCOTROL ) 5 MG tablet Take 5 mg by mouth 2 (two) times daily.   hydroxychloroquine  (PLAQUENIL ) 200 MG tablet Take 200 mg by mouth 2 (two) times daily.   insulin  NPH-regular Human (HUMULIN  70/30) (70-30) 100 UNIT/ML injection Inject 25-50 Units into the skin See admin instructions. Inject 50 units into the skin in the morning and 30 units in the evening   lovastatin  (MEVACOR ) 40 MG tablet Take 1 tablet (40 mg total) by mouth daily with supper.   memantine (NAMENDA) 5 MG tablet Take 5 mg by mouth 2 (two) times daily.   metFORMIN  (GLUCOPHAGE ) 1000 MG tablet Take 1,000 mg by mouth 2 (two) times daily with a meal.    metoprolol  succinate (TOPROL -XL) 100 MG 24 hr tablet Take 1 tablet (100 mg total) by mouth at bedtime.   Multiple Vitamins-Minerals (ADVANCED DIABETIC MULTIVITAMIN) TABS Take 1 tablet by mouth daily.   sildenafil (VIAGRA) 100 MG tablet Take 100 mg by mouth daily as needed.   triamterene -hydrochlorothiazide  (DYAZIDE ) 37.5-25 MG capsule Take 1 capsule by mouth as needed (per patient).   venlafaxine  XR (EFFEXOR -XR) 75 MG 24 hr capsule Take 75 mg by mouth daily with breakfast.   vitamin B-12 (CYANOCOBALAMIN ) 500 MCG tablet Take 1,000 mcg by mouth daily.   [DISCONTINUED] doxycycline  (VIBRAMYCIN ) 100 MG  capsule Take 1 capsule (100 mg total) by mouth 2 (two) times daily.   [DISCONTINUED] furosemide  (LASIX ) 20 MG tablet Take 1 tablet (20 mg total) by mouth daily. (Patient not taking: Reported on 10/21/2023)   [DISCONTINUED] mupirocin ointment (BACTROBAN) 2 % Apply 1 Application topically daily.   No facility-administered encounter medications on file as of 12/31/2023.    Social History: Social History   Tobacco Use   Smoking status: Former    Current packs/day: 0.00    Types: Cigarettes    Quit date: 1980    Years since quitting: 45.7   Smokeless tobacco: Never   Tobacco comments:    1980  Vaping Use   Vaping status: Never Used  Substance Use Topics   Alcohol use: Yes    Comment: rarely   Drug use: Never    Family Medical History: Family History  Problem Relation Age of Onset   Dementia Mother    Heart attack Father    Emphysema Father    Heart attack Brother     Physical Examination: Vitals:   12/31/23 0901  BP:  122/60    General: Patient is well developed, well nourished, calm, collected, and in no apparent distress. Attention to examination is appropriate.  Respiratory: Patient is breathing without any difficulty.   NEUROLOGICAL:     Awake, alert, oriented to person, place, and time.    Strength:  Side Iliopsoas Quads Hamstring PF DF EHL  R 5 5 5 5 5 5   L 5 5 5 5 5 5    Reflexes are 1+ and symmetric at patella and achilles.   Hoffman's is absent.  Clonus is not present.   Bilateral upper and lower extremity sensation is intact to light touch.     No pain with IR/ER of both hips.   Gait is slow.    Medical Decision Making  Imaging: Narrative & Impression  CLINICAL DATA:  Chronic low back pain radiating into the back of both legs, right worse than left. Remote history of lumbar surgery.   EXAM: MRI LUMBAR SPINE WITHOUT CONTRAST   TECHNIQUE: Multiplanar, multisequence MR imaging of the lumbar spine was performed. No intravenous contrast was  administered.   COMPARISON:  None Available.   FINDINGS: Segmentation:  Standard.   Alignment:  Normal.   Vertebrae:  No fracture, evidence of discitis, or bone lesion.   Conus medullaris and cauda equina: Conus extends to the L1 level. Conus and cauda equina appear normal.   Paraspinal and other soft tissues: The patient has a mass in the left adrenal gland measuring 4.0 x 3.7 cm the lesion is T2 hypointense and intermediate signal intensity on T1 weighted images. Otherwise negative.   Disc levels:   T12-L1: Mild facet degenerative change.  Otherwise negative.   L1-2: Negative.   L2-3: Minimal disc bulge.  No stenosis.   L3-4: Mild disc bulge and facet degenerative disease. There is mild central canal narrowing. The foramina are open.   L4-5: Broad-based central protrusion and ligamentum flavum thickening cause moderate to moderately severe central canal stenosis and narrowing of both lateral recesses. The foramina are open.   L5-S1: Shallow disc bulge and endplate spur without stenosis.   IMPRESSION: 1. 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 Recommend adrenal protocol MRI with and without contrast for further evaluation. 2. 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     Electronically Signed   By: Debby Prader M.D.   On: 12/30/2023 11:13   I personally reviewed his lumbar x-rays which did not show any pathologic movement at L4-5.  I have personally reviewed the images and agree with the above interpretation.  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 MRI shows disc bulge and ligamentous plus facet hypertrophy. Surgical intervention  planned due to persistent symptoms despite physical therapy, watchful waiting over the past year, and expected recovery.  Given his persistent symptomatology and interference of his ability to perform his activities of daily living we will plan for an L4-5 lumbar laminectomy, medial facetectomy, and lateral recess decompression with a midline approach.  Explained risks of surgery including spinal fluid leak, infection, and destabilization. Discussed benefits of improved ambulatory function and symptom reduction. He agreed to proceed with surgery. - Ordered bending x-rays to assess spinal stability. - Scheduled lumbar laminectomy at L4-5 with midline approach, including laminectomy, medial facetectomy, and lateral recess  decompression. - Scheduled surgery for mid-December, with options for Tuesdays or Thursdays. - Discussed potential for outpatient surgery with possible overnight stay if complications arise.

## 2024-02-19 NOTE — Progress Notes (Signed)
 Referring Physician:  Cleotilde Oneil FALCON, MD [address] [facility] [address] [facility] [place],  [place] [address]  Primary Physician:  Kristopher Oneil FALCON, MD   Discussed the use of AI scribe software for clinical note transcription with the patient, who gave verbal consent to proceed.  History of Present Illness Kristopher Oneal is a 73 year old male with severe lumbar claudication who presents with ambulatory dysfunction and leg weakness. He is accompanied by his wife, who is also his caretaker. He was referred by Kristopher Oneal for evaluation of his leg weakness and ambulatory dysfunction.  He experiences significant leg weakness, describing them as prone to giving out, especially during ambulation. He has difficulty climbing steps and feels exhausted after short distances. He often uses a riding cart or leans on a push cart when shopping. The weakness is associated with tiredness rather than pain, starting from the buttocks and extending downwards.  He has experienced falls, including a recent incident where he lost balance on steps. His gait is described as shuffling, and he feels unstable, particularly after prolonged walking. He notes improvement in rising from chairs without using his hands if the chair is not too low.  He underwent back surgery in 1993 at the L5-S1 level and a partial discectomy at L4-5. He has completed over twelve sessions of physical therapy and continues exercises at home. He has a history of knee problems and replacements, which have exacerbated his back issues.  Conservative measures:  Physical therapy: has not participated in (2024 PT after toe amputation) Multimodal medical therapy including regular antiinflammatories: nothing Injections:  no epidural steroid injections  Past Surgery:  Lumbar laminectomy in 1993 L5/S1   Kristopher Oneal has no symptoms of cervical myelopathy.  The symptoms are causing a significant impact on the patient's life.   Review of  Systems:  A 10 point review of systems is negative, except for the pertinent positives and negatives detailed in the HPI.  Past Medical History: Past Medical History:  Diagnosis Date   Anxiety    Arrhythmia    atrial fibrillation   Arthritis    CHF (congestive heart failure) (HCC)    Diabetes mellitus without complication (HCC)    type 2   Dysrhythmia    PVC/ a fib   Heart murmur    High cholesterol    Hypertension     Past Surgical History: Past Surgical History:  Procedure Laterality Date   AMPUTATION TOE Left 02/10/2023   Procedure: Left Hallux Amputation;  Surgeon: Neill Boas, DPM;  Location: [facility];  Service: Orthopedics/Podiatry;  Laterality: Left;   BACK SURGERY     l5/s1   COLONOSCOPY     KNEE ARTHROPLASTY Right 01/02/2020   Procedure: COMPUTER ASSISTED TOTAL KNEE ARTHROPLASTY;  Surgeon: Mardee Lynwood SQUIBB, MD;  Location: [facility];  Service: Orthopedics;  Laterality: Right;   KNEE ARTHROPLASTY Left 11/19/2020   Procedure: COMPUTER ASSISTED TOTAL KNEE ARTHROPLASTY;  Surgeon: Mardee Lynwood SQUIBB, MD;  Location: [facility];  Service: Orthopedics;  Laterality: Left;   LOWER EXTREMITY ANGIOGRAPHY Left 02/10/2023   Procedure: Lower Extremity Angiography;  Surgeon: Jama Cordella MATSU, MD;  Location: [facility];  Service: Cardiovascular;  Laterality: Left;   ORIF PATELLA Left 04/16/2021   Procedure: OPEN REDUCTION INTERNAL (ORIF) FIXATION PATELLA;  Surgeon: Mardee Lynwood SQUIBB, MD;  Location: [facility];  Service: Orthopedics;  Laterality: Left;   ORIF PATELLA Left 05/10/2021   Procedure: OPEN REDUCTION INTERNAL (ORIF) FIXATION PATELLA;  Surgeon: Mardee Lynwood SQUIBB,  MD;  Location: [facility];  Service: Orthopedics;  Laterality: Left;    Allergies: Allergies as of 12/31/2023 - Review Complete 12/31/2023  Allergen Reaction Noted   Gabapentin  Other (See Comments) 01/21/2022    Medications: Outpatient Encounter Medications as of 12/31/2023  Medication Sig   carbidopa-levodopa  (SINEMET IR) 25-100 MG tablet Take 1 tablet by mouth 2 (two) times daily.   clopidogrel  (PLAVIX ) 75 MG tablet Take 1 tablet (75 mg total) by mouth daily with breakfast.   Continuous Blood Gluc Sensor (FREESTYLE LIBRE 14 DAY SENSOR) MISC SMARTSIG:1 Kit(s) Topical Every 2 Weeks   diltiazem  (CARDIZEM  CD) 300 MG 24 hr capsule Take 1 capsule (300 mg total) by mouth at bedtime.   ELIQUIS  5 MG TABS tablet Take 1 tablet (5 mg total) by mouth 2 (two) times daily.   escitalopram (LEXAPRO) 10 MG tablet Take 10 mg by mouth daily.   fenofibrate  160 MG tablet Take 160 mg by mouth daily.    fluticasone  (FLONASE ) 50 MCG/ACT nasal spray Place 1 spray into the nose daily as needed for allergies.   glipiZIDE  (GLUCOTROL ) 5 MG tablet Take 5 mg by mouth 2 (two) times daily.   hydroxychloroquine  (PLAQUENIL ) 200 MG tablet Take 200 mg by mouth 2 (two) times daily.   insulin  NPH-regular Human (HUMULIN  70/30) (70-30) 100 UNIT/ML injection Inject 25-50 Units into the skin See admin instructions. Inject 50 units into the skin in the morning and 30 units in the evening   lovastatin  (MEVACOR ) 40 MG tablet Take 1 tablet (40 mg total) by mouth daily with supper.   memantine (NAMENDA) 5 MG tablet Take 5 mg by mouth 2 (two) times daily.   metFORMIN  (GLUCOPHAGE ) 1000 MG tablet Take 1,000 mg by mouth 2 (two) times daily with a meal.    metoprolol  succinate (TOPROL -XL) 100 MG 24 hr tablet Take 1 tablet (100 mg total) by mouth at bedtime.   Multiple Vitamins-Minerals (ADVANCED DIABETIC MULTIVITAMIN) TABS Take 1 tablet by mouth daily.   sildenafil (VIAGRA) 100 MG tablet Take 100 mg by mouth daily as needed.   triamterene -hydrochlorothiazide  (DYAZIDE ) 37.5-25 MG capsule Take 1 capsule by mouth as needed (per patient).   venlafaxine  XR (EFFEXOR -XR) 75 MG 24 hr capsule Take 75 mg by mouth daily with breakfast.   vitamin B-12 (CYANOCOBALAMIN ) 500 MCG tablet Take 1,000 mcg by mouth daily.   [DISCONTINUED] doxycycline  (VIBRAMYCIN ) 100 MG  capsule Take 1 capsule (100 mg total) by mouth 2 (two) times daily.   [DISCONTINUED] furosemide  (LASIX ) 20 MG tablet Take 1 tablet (20 mg total) by mouth daily. (Patient not taking: Reported on 10/21/2023)   [DISCONTINUED] mupirocin ointment (BACTROBAN) 2 % Apply 1 Application topically daily.   No facility-administered encounter medications on file as of 12/31/2023.    Social History: Social History   Tobacco Use   Smoking status: Former    Current packs/day: 0.00    Types: Cigarettes    Quit date: 1980    Years since quitting: 45.7   Smokeless tobacco: Never   Tobacco comments:    1980  Vaping Use   Vaping status: Never Used  Substance Use Topics   Alcohol use: Yes    Comment: rarely   Drug use: Never    Family Medical History: Family History  Problem Relation Age of Onset   Dementia Mother    Heart attack Father    Emphysema Father    Heart attack Brother     Physical Examination: Vitals:   12/31/23 0901  BP:  122/60    General: Patient is well developed, well nourished, calm, collected, and in no apparent distress. Attention to examination is appropriate.  Respiratory: Patient is breathing without any difficulty.   NEUROLOGICAL:     Awake, alert, oriented to person, place, and time.    Strength:  Side Iliopsoas Quads Hamstring PF DF EHL  R 5 5 5 5 5 5   L 5 5 5 5 5 5    Reflexes are 1+ and symmetric at patella and achilles.   Hoffman's is absent.  Clonus is not present.   Bilateral upper and lower extremity sensation is intact to light touch.     No pain with IR/ER of both hips.   Gait is slow.    Medical Decision Making  Imaging: Narrative & Impression  CLINICAL DATA:  Chronic low back pain radiating into the back of both legs, right worse than left. Remote history of lumbar surgery.   EXAM: MRI LUMBAR SPINE WITHOUT CONTRAST   TECHNIQUE: Multiplanar, multisequence MR imaging of the lumbar spine was performed. No intravenous contrast was  administered.   COMPARISON:  None Available.   FINDINGS: Segmentation:  Standard.   Alignment:  Normal.   Vertebrae:  No fracture, evidence of discitis, or bone lesion.   Conus medullaris and cauda equina: Conus extends to the L1 level. Conus and cauda equina appear normal.   Paraspinal and other soft tissues: The patient has a mass in the left adrenal gland measuring 4.0 x 3.7 cm the lesion is T2 hypointense and intermediate signal intensity on T1 weighted images. Otherwise negative.   Disc levels:   T12-L1: Mild facet degenerative change.  Otherwise negative.   L1-2: Negative.   L2-3: Minimal disc bulge.  No stenosis.   L3-4: Mild disc bulge and facet degenerative disease. There is mild central canal narrowing. The foramina are open.   L4-5: Broad-based central protrusion and ligamentum flavum thickening cause moderate to moderately severe central canal stenosis and narrowing of both lateral recesses. The foramina are open.   L5-S1: Shallow disc bulge and endplate spur without stenosis.   IMPRESSION: 1. 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 Recommend adrenal protocol MRI with and without contrast for further evaluation. 2. 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     Electronically Signed   By: Debby Prader M.D.   On: 12/30/2023 11:13   I personally reviewed his lumbar x-rays which did not show any pathologic movement at L4-5.  I have personally reviewed the images and agree with the above interpretation.  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 MRI shows disc bulge and ligamentous plus facet hypertrophy. Surgical intervention  planned due to persistent symptoms despite physical therapy, watchful waiting over the past year, and expected recovery.  Given his persistent symptomatology and interference of his ability to perform his activities of daily living we will plan for an L4-5 lumbar laminectomy, medial facetectomy, and lateral recess decompression with a midline approach.  Explained risks of surgery including spinal fluid leak, infection, and destabilization. Discussed benefits of improved ambulatory function and symptom reduction. He agreed to proceed with surgery. - Ordered bending x-rays to assess spinal stability. - Scheduled lumbar laminectomy at L4-5 with midline approach, including laminectomy, medial facetectomy, and lateral recess  decompression. - Scheduled surgery for mid-December, with options for Tuesdays or Thursdays. - Discussed potential for outpatient surgery with possible overnight stay if complications arise.

## 2024-02-23 ENCOUNTER — Ambulatory Visit: Admitting: Orthopedic Surgery

## 2024-03-01 ENCOUNTER — Ambulatory Visit: Admitting: Physical Therapy

## 2024-03-02 ENCOUNTER — Other Ambulatory Visit: Payer: Self-pay

## 2024-03-02 ENCOUNTER — Ambulatory Visit

## 2024-03-02 ENCOUNTER — Telehealth (HOSPITAL_BASED_OUTPATIENT_CLINIC_OR_DEPARTMENT_OTHER): Payer: Self-pay

## 2024-03-02 ENCOUNTER — Ambulatory Visit: Payer: Self-pay | Admitting: Neurosurgery

## 2024-03-02 ENCOUNTER — Ambulatory Visit: Admitting: Neurosurgery

## 2024-03-02 VITALS — BP 136/84 | Wt 273.8 lb

## 2024-03-02 DIAGNOSIS — R2689 Other abnormalities of gait and mobility: Secondary | ICD-10-CM | POA: Diagnosis not present

## 2024-03-02 DIAGNOSIS — M47816 Spondylosis without myelopathy or radiculopathy, lumbar region: Secondary | ICD-10-CM | POA: Diagnosis not present

## 2024-03-02 DIAGNOSIS — M48062 Spinal stenosis, lumbar region with neurogenic claudication: Secondary | ICD-10-CM | POA: Insufficient documentation

## 2024-03-02 DIAGNOSIS — Z01818 Encounter for other preprocedural examination: Secondary | ICD-10-CM

## 2024-03-02 DIAGNOSIS — R262 Difficulty in walking, not elsewhere classified: Secondary | ICD-10-CM

## 2024-03-02 DIAGNOSIS — M51369 Other intervertebral disc degeneration, lumbar region without mention of lumbar back pain or lower extremity pain: Secondary | ICD-10-CM

## 2024-03-02 NOTE — Telephone Encounter (Addendum)
 Hi all, when patient was in the office today, he stated I'm not taking Eliquis , I'm taking Plavix . His dispense history shows a 90 day supply of plavix  was written (by Dr Oneil Pinal) and dispensed by [facility] on 02/18/2024. I cannot find a note around that date stating why there was a change in his medication. Prior to 02/18/24, it appears he last filled plavix  on 03/10/23 and Eliquis  on 12/16/22.  I have listed our requested hold times for both medications below.  Eliquis  (apixaban ):   stop Eliquis  3 days prior, resume Eliquis  14 days after Plavix  (clopidogrel ):     Stop Plavix  7 days prior, resume Plavix  14 days after   Thank you!

## 2024-03-02 NOTE — Telephone Encounter (Signed)
 Please advise holding Eliquis  prior to Lumbar laminectomy, medial facetectomy and lateral recess decompression, L4-L5, midline approach.  Last labs October 2025  Request says Plavix  but patient is on Eliquis .   Thank you!  DW

## 2024-03-02 NOTE — Patient Instructions (Addendum)
 Please see below for information in regards to your upcoming surgery:   Planned surgery: Lumbar laminectomy, medial facetectomy and lateral recess decompression, L4-5, midline approach   Surgery date: 03/31/24 at [facility] (Medical Mall: [address], [place], [place] [address]) - you will find out your arrival time the business day before your surgery.   Pre-op appointment at [facility] Pre-admit Testing: you will receive a call with a date/time for this appointment. If you are scheduled for an in person appointment, Pre-admit Testing is located on the first floor of the Medical Arts building, [address], [address]. During this appointment, they will advise you which medications you can take the morning of surgery, and which medications you will need to hold for surgery. Labs (such as blood work, EKG) may be done at your pre-op appointment. You are not required to fast for these labs. Should you need to change your pre-op appointment, please call Pre-admit testing at [phone].     Blood thinners:   Plavix  (clopidogrel ):     Stop Plavix  7 days prior, resume Plavix  14 days after     Diabetes/heart failure/kidney disease/weight loss medications that require an extended hold: Per anesthesia guidelines (due to the increased risk of aspiration caused by delayed gastric emptying):   Metformin : hold for 2 days prior to surgery   Surgical clearance: we will send a clearance form to Oneil Pinal, MD & [facility]. They may wish to see you in their office prior to signing the clearance form. If so, they may call you to schedule an appointment.      Common restrictions after spine surgery: No bending, lifting, or twisting ("BLT"). Avoid lifting objects heavier than 10 pounds for the first 6 weeks after surgery. Where possible, avoid household activities that involve lifting, bending, reaching, pushing, or pulling such as laundry, vacuuming,  grocery shopping, and childcare. Try to arrange for help from friends and family for these activities while you heal. Do not drive while taking prescription pain medication. Weeks 6 through 12 after surgery: avoid lifting more than 25 pounds.     How to contact us :  If you have any questions/concerns before or after surgery, you can reach us  at [phone], or you can send a mychart message. We can be reached by phone or mychart 8am-4pm, Monday-Friday.  *Please note: Calls after 4pm are forwarded to a third party answering service. Mychart messages are not routinely monitored during evenings, weekends, and holidays. Please call our office to contact the answering service for urgent concerns during non-business hours.     If you have FMLA/disability paperwork, please drop it off or fax it to [phone]   Appointments/FMLA & disability paperwork: Reche Hait, & Nichole Registered Nurse/Surgery scheduler: Kendelyn, RN & Katie, RN Certified Medical Assistants: Don, CMA, Elenor, CMA, Damien, CMA, & Auston, [place] Physician Assistants: Lyle Decamp, PA-C, Edsel Goods, PA-C & Glade Boys, PA-C Surgeons: Penne Sharps, MD & Reeves Daisy, MD    [facility] PREADMIT TESTING VISIT and SURGERY INFORMATION SHEET   Now that surgery has been scheduled you can anticipate several phone calls from [facility] services. A pharmacy technician will call you to verify your current list of medications taken at home.               The Pre-Service Center will call to verify your insurance information and to give you billing estimates and information.  The Preadmit Testing Office will be calling to schedule a visit to obtain information for the anesthesia team and provide instructions on preparation for surgery.  What can you expect for the Preadmit Testing Visit: Appointments may be scheduled in-person or by telephone.  If a telephone visit is scheduled, you may  be asked to come into the office to have lab tests or other studies performed.   This visit will not be completed any greater than 14 days prior to your surgery.  If your surgery has been scheduled for a future date, please do not be alarmed if we have not contacted you to schedule an appointment more than a month prior to the surgery date.    Please be prepared to provide the following information during this appointment:            -Personal medical history                                               -Medication and allergy list            -Any history of problems with anesthesia              -Recent lab work or diagnostic studies            -Please notify us  of any needs we should be aware of to provide the best care possible           -You will be provided with instructions on how to prepare for your surgery.    On The Day of Surgery:  You must have a driver to take you home after surgery, you will be asked not to drive for 24 hours following surgery.  Taxi, Gisele and non-medical transport will not be acceptable means of transportation unless you have a responsible individual who will be traveling with you.  Visitors in the surgical area:   2 people will be able to visit you in your room once your preparation for surgery has been completed. During surgery, your visitors will be asked to wait in the Surgery Waiting Area.  It is not a requirement for them to stay, if they prefer to leave and come back.  Your visitor(s) will be given an update once the surgery has been completed.  No visitors are allowed in the initial recovery room to respect patient privacy and safety.  Once you are more awake and transfer to the secondary recovery area, or are transferred to an inpatient room, visitors will again be able to see you.  To respect and protect your privacy: We will ask on the day of surgery who your driver will be and what the contact number for that individual will be. We will ask if it is  okay to share information with this individual, or if there is an alternative individual that we, or the surgeon, should contact to provide updates and information. If family or friends come to the surgical information desk requesting information about you, who you have not listed with us , no information will be given.   It may be helpful to designate someone as the main contact who will be responsible for updating your other friends and family.    PREADMIT TESTING OFFICE: [phone] SAME DAY SURGERY: [phone] We look forward to caring for you before and throughout the process of your surgery.

## 2024-03-02 NOTE — Telephone Encounter (Signed)
   Pre-operative Risk Assessment    Patient Name: Kristopher Oneal  DOB: [birth year] MRN: [id number]   Date of last office visit: 10/21/23 Date of next office visit: Not scheduled   Request for Surgical Clearance    Procedure:  Lumbar laminectomy, medial facetectomy and lateral recess decompression, L4-L5, midline approach  Date of Surgery:  Clearance 03/31/24                                Surgeon:  Penne Sharps, MD Surgeon's Group or Practice Name:  [facility] Phone number:  [phone] Fax number:  [phone]   Type of Clearance Requested:   - Medical  - Pharmacy:  Hold Clopidogrel  (Plavix ) x 7 days prior, resume 14 days after   Type of Anesthesia:  MAC   Additional requests/questions:    [name]   03/02/2024, 2:37 PM

## 2024-03-02 NOTE — Addendum Note (Signed)
 Addended by: Charleen Madera on: 03/02/2024 04:06 PM   Modules accepted: Orders

## 2024-03-04 NOTE — Telephone Encounter (Signed)
 Pharmacy, please see the updated note from surgeon's office in regards to the duration of Eliquis  hold. Thank you!  In regards to Plavix , prior office visit note mentions he is on Plavix  for PAD and this is managed by Vascular Surgery. Therefore, I would defer recommendations for holding Plavix  to them.

## 2024-03-04 NOTE — Telephone Encounter (Signed)
 Per Cardiology note here, deferring to vascular surgery for holding plavix  for surgical clearance. Is vascular surgery ok to hold plavix  7 days prior to surgery and resume 14 days after?

## 2024-03-05 NOTE — Telephone Encounter (Signed)
 Yes that is fine

## 2024-03-09 ENCOUNTER — Ambulatory Visit: Payer: Self-pay | Admitting: Neurosurgery

## 2024-03-10 NOTE — Telephone Encounter (Signed)
 Left the patient a detailed message for him to call back to set up an appointment. 1st attempt.

## 2024-03-10 NOTE — Telephone Encounter (Signed)
 S/w the pt's wife (DPR) as sqhe stated the pt has some dementia. Pt has been scheduled to see Cadence Franchester, [employer] 03/23/24 in [place]. @ 10:30.

## 2024-03-10 NOTE — Telephone Encounter (Signed)
 Just checking in. Is this patient considered clear from a cardiac perspective given vascular surgery is ok with holding plavix ?

## 2024-03-10 NOTE — Telephone Encounter (Signed)
 Pt returning call. Please advise.

## 2024-03-10 NOTE — Telephone Encounter (Signed)
   Name: Kristopher Oneal  DOB: 1951-03-03  MRN: [id number]  Primary Cardiologist: Evalene Lunger, MD  Chart reviewed as part of pre-operative protocol coverage. Because of Kristopher Oneal's past medical history and time since last visit, he will require a follow-up in-office visit in order to better assess preoperative cardiovascular risk.  Pre-op covering staff: - Please schedule appointment and call patient to inform them. If patient already had an upcoming appointment within acceptable timeframe, please add pre-op clearance to the appointment notes so provider is aware. - Please contact requesting surgeon's office via preferred method (i.e, phone, fax) to inform them of need for appointment prior to surgery.  Vascular has already waited in on holding Plavix  for 7 days prior to surgery and resuming 14 days after.  We are still waiting on recommendations from pharmacy regarding the Eliquis  hold. Will route to them as well.    Kristopher LOISE Fabry, PA-C  03/10/2024, 1:36 PM

## 2024-03-22 ENCOUNTER — Other Ambulatory Visit: Payer: Self-pay

## 2024-03-22 ENCOUNTER — Inpatient Hospital Stay
Admission: RE | Admit: 2024-03-22 | Discharge: 2024-03-22 | Disposition: A | Source: Ambulatory Visit | Attending: Acute Care | Admitting: Acute Care

## 2024-03-22 VITALS — BP 118/65 | HR 69 | Resp 14 | Ht 74.0 in | Wt 267.0 lb

## 2024-03-22 DIAGNOSIS — E119 Type 2 diabetes mellitus without complications: Secondary | ICD-10-CM | POA: Insufficient documentation

## 2024-03-22 DIAGNOSIS — Z01812 Encounter for preprocedural laboratory examination: Secondary | ICD-10-CM | POA: Insufficient documentation

## 2024-03-22 DIAGNOSIS — Z01818 Encounter for other preprocedural examination: Secondary | ICD-10-CM

## 2024-03-22 HISTORY — DX: Gastro-esophageal reflux disease without esophagitis: K21.9

## 2024-03-22 HISTORY — DX: Peripheral vascular disease, unspecified: I73.9

## 2024-03-22 LAB — TYPE AND SCREEN
ABO/RH(D): A POS
Antibody Screen: NEGATIVE

## 2024-03-22 LAB — SURGICAL PCR SCREEN
MRSA, PCR: NEGATIVE
Staphylococcus aureus: NEGATIVE

## 2024-03-22 NOTE — Patient Instructions (Addendum)
 Your procedure is scheduled on: Thursday 03/31/24 Report to the Registration Desk on the 1st floor of the Medical Mall. To find out your arrival time, please call [phone] between 1PM - 3PM on: Wednesday 03/30/24 If your arrival time is 6:00 am, do not arrive before that time as the Medical Mall entrance doors do not open until 6:00 am.  REMEMBER: Instructions that are not followed completely may result in serious medical risk, up to and including death; or upon the discretion of your surgeon and anesthesiologist your surgery may need to be rescheduled.  Do not eat food after midnight the night before surgery.  No gum chewing or hard candies.  You may however, drink CLEAR liquids up to 2 hours before you are scheduled to arrive for your surgery. Do not drink anything within 2 hours of your scheduled arrival time.  Clear liquids include: - water   **Type 1 and Type 2 diabetics should only drink water.**  One week prior to surgery: Stop Anti-inflammatories (NSAIDS) such as Advil, Aleve, Ibuprofen, Motrin, Naproxen, Naprosyn and Aspirin  based products such as Excedrin, Goody's Powder, BC Powder.  You may however, continue to take Tylenol  if needed for pain up until the day of surgery.  Stop ANY OVER THE COUNTER supplements and vitamins for 7 days until after surgery.  **Follow guidelines for insulin  and diabetes medications.** Metformin , hold for 2 days. Last dose Monday 03/28/24  **Follow recommendations regarding stopping blood thinners.** Plavix  hold for 7 days before surgery. Last dose Wednesday 02/22/24.   Continue taking all of your other prescription medications up until the day of surgery.  ON THE DAY OF SURGERY ONLY TAKE THESE MEDICATIONS WITH SIPS OF WATER:  escitalopram (LEXAPRO) 10 MG tablet  lamoTRIgine (LAMICTAL) 25 MG tablet  memantine (NAMENDA) 5 MG tablet  pantoprazole  (PROTONIX ) 40 MG tablet   No Alcohol for 24 hours before or after surgery.  No Smoking  including e-cigarettes for 24 hours before surgery.  No chewable tobacco products for at least 6 hours before surgery.  No nicotine patches on the day of surgery.  Do not use any recreational drugs for at least a week (preferably 2 weeks) before your surgery.  Please be advised that the combination of cocaine and anesthesia may have negative outcomes, up to and including death. If you test positive for cocaine, your surgery will be cancelled.  On the morning of surgery brush your teeth with toothpaste and water, you may rinse your mouth with mouthwash if you wish. Do not swallow any toothpaste or mouthwash.  Use CHG Soap or wipes as directed on instruction sheet.  Do not shave body hair from the neck down (see soap instructions)  Do not wear lotions, powders, or perfumes on the day of surgery.  Wear comfortable clothing (specific to your surgery type) to the hospital.  Do not wear jewelry, make-up, hairpins, clips or nail polish.  For welded (permanent) jewelry: bracelets, anklets, waist bands, etc.  Please have this removed prior to surgery.  If it is not removed, there is a chance that hospital personnel will need to cut it off on the day of surgery.  Contact lenses, hearing aids and dentures may not be worn into surgery. Bring a case for glasses, contacts or hearing aids,  Do not bring valuables to the hospital. [facility] is not responsible for any missing/lost belongings or valuables.   Notify your doctor if there is any change in your medical condition (cold, fever, infection).  After surgery,  you can help prevent lung complications by doing breathing exercises.  Take deep breaths and cough every 1-2 hours. Your doctor may order a device called an Incentive Spirometer to help you take deep breaths.  If you are being admitted to the hospital overnight, leave your suitcase in the car. After surgery it may be brought to your room.  In case of increased patient census, it may  be necessary for you, the patient, to continue your postoperative care in the Same Day Surgery department.  Please call the Pre-admissions Testing Dept. at [phone] if you have any questions about these instructions.  Surgery Visitation Policy:  Patients having surgery or a procedure may have two visitors.  Children under the age of 59 must have an adult with them who is not the patient.  Inpatient Visitation:    Visiting hours are 7 a.m. to 8 p.m. Up to four visitors are allowed at one time in a patient room. The visitors may rotate out with other people during the day.  One visitor age 22 or older may stay with the patient overnight and must be in the room by 8 p.m.   Merchandiser, Retail to address health-related social needs:  [email or web address]    Pre-operative 4 CHG Bath Instructions   You can play a key role in reducing the risk of infection after surgery. Your skin needs to be as free of germs as possible. You can reduce the number of germs on your skin by washing with CHG (chlorhexidine  gluconate) soap before surgery. CHG is an antiseptic soap that kills germs and continues to kill germs even after washing.   DO NOT use if you have an allergy to chlorhexidine /CHG or antibacterial soaps. If your skin becomes reddened or irritated, stop using the CHG and notify one of our RNs at [phone].   Please shower with the CHG soap starting 4 days before surgery using the following schedule:   Sunday 03/27/24 - Wednesday 03/30/24    Please keep in mind the following:  DO NOT shave, including legs and underarms, starting the day of your first shower.   You may shave your face at any point before/day of surgery.  Place clean sheets on your bed the day you start using CHG soap. Use a clean washcloth (not used since being washed) for each shower. DO NOT sleep with pets once you start using the CHG.   CHG Shower Instructions:  If you choose to wash your  hair and private area, wash first with your normal shampoo/soap.  After you use shampoo/soap, rinse your hair and body thoroughly to remove shampoo/soap residue.  Turn the water OFF and apply about 3 tablespoons (45 ml) of CHG soap to a CLEAN washcloth.  Apply CHG soap ONLY FROM YOUR NECK DOWN TO YOUR TOES (washing for 3-5 minutes)  DO NOT use CHG soap on face, private areas, open wounds, or sores.  Pay special attention to the area where your surgery is being performed.  If you are having back surgery, having someone wash your back for you may be helpful. Wait 2 minutes after CHG soap is applied, then you may rinse off the CHG soap.  Pat dry with a clean towel  Put on clean clothes/pajamas   If you choose to wear lotion, please use ONLY the CHG-compatible lotions on the back of this paper.     Additional instructions for the day of surgery: DO NOT APPLY any lotions, deodorants, cologne, or perfumes.   Put  on clean/comfortable clothes.  Brush your teeth.  Ask your nurse before applying any prescription medications to the skin.      CHG Compatible Lotions   Aveeno Moisturizing lotion  Cetaphil Moisturizing Cream  Cetaphil Moisturizing Lotion  Clairol Herbal Essence Moisturizing Lotion, Dry Skin  Clairol Herbal Essence Moisturizing Lotion, Extra Dry Skin  Clairol Herbal Essence Moisturizing Lotion, Normal Skin  Curel Age Defying Therapeutic Moisturizing Lotion with Alpha Hydroxy  Curel Extreme Care Body Lotion  Curel Soothing Hands Moisturizing Hand Lotion  Curel Therapeutic Moisturizing Cream, Fragrance-Free  Curel Therapeutic Moisturizing Lotion, Fragrance-Free  Curel Therapeutic Moisturizing Lotion, Original Formula  Eucerin Daily Replenishing Lotion  Eucerin Dry Skin Therapy Plus Alpha Hydroxy Crme  Eucerin Dry Skin Therapy Plus Alpha Hydroxy Lotion  Eucerin Original Crme  Eucerin Original Lotion  Eucerin Plus Crme Eucerin Plus Lotion  Eucerin TriLipid Replenishing  Lotion  Keri Anti-Bacterial Hand Lotion  Keri Deep Conditioning Original Lotion Dry Skin Formula Softly Scented  Keri Deep Conditioning Original Lotion, Fragrance Free Sensitive Skin Formula  Keri Lotion Fast Absorbing Fragrance Free Sensitive Skin Formula  Keri Lotion Fast Absorbing Softly Scented Dry Skin Formula  Keri Original Lotion  Keri Skin Renewal Lotion Keri Silky Smooth Lotion  Keri Silky Smooth Sensitive Skin Lotion  Nivea Body Creamy Conditioning Oil  Nivea Body Extra Enriched Lotion  Nivea Body Original Lotion  Nivea Body Sheer Moisturizing Lotion Nivea Crme  Nivea Skin Firming Lotion  NutraDerm 30 Skin Lotion  NutraDerm Skin Lotion  NutraDerm Therapeutic Skin Cream  NutraDerm Therapeutic Skin Lotion  ProShield Protective Hand Cream  Provon moisturizing lotion  How to Use an Incentive Spirometer  An incentive spirometer is a tool that measures how well you are filling your lungs with each breath. Learning to take long, deep breaths using this tool can help you keep your lungs clear and active. This may help to reverse or lessen your chance of developing breathing (pulmonary) problems, especially infection. You may be asked to use a spirometer: After a surgery. If you have a lung problem or a history of smoking. After a long period of time when you have been unable to move or be active. If the spirometer includes an indicator to show the highest number that you have reached, your health care provider or respiratory therapist will help you set a goal. Keep a log of your progress as told by your health care provider. What are the risks? Breathing too quickly may cause dizziness or cause you to pass out. Take your time so you do not get dizzy or light-headed. If you are in pain, you may need to take pain medicine before doing incentive spirometry. It is harder to take a deep breath if you are having pain. How to use your incentive spirometer  Sit up on the edge of your bed  or on a chair. Hold the incentive spirometer so that it is in an upright position. Before you use the spirometer, breathe out normally. Place the mouthpiece in your mouth. Make sure your lips are closed tightly around it. Breathe in slowly and as deeply as you can through your mouth, causing the piston or the ball to rise toward the top of the chamber. Hold your breath for 3-5 seconds, or for as long as possible. If the spirometer includes a coach indicator, use this to guide you in breathing. Slow down your breathing if the indicator goes above the marked areas. Remove the mouthpiece from your mouth and breathe out normally.  The piston or ball will return to the bottom of the chamber. Rest for a few seconds, then repeat the steps 10 or more times. Take your time and take a few normal breaths between deep breaths so that you do not get dizzy or light-headed. Do this every 1-2 hours when you are awake. If the spirometer includes a goal marker to show the highest number you have reached (best effort), use this as a goal to work toward during each repetition. After each set of 10 deep breaths, cough a few times. This will help to make sure that your lungs are clear. If you have an incision on your chest or abdomen from surgery, place a pillow or a rolled-up towel firmly against the incision when you cough. This can help to reduce pain while taking deep breaths and coughing. General tips When you are able to get out of bed: Walk around often. Continue to take deep breaths and cough in order to clear your lungs. Keep using the incentive spirometer until your health care provider says it is okay to stop using it. If you have been in the hospital, you may be told to keep using the spirometer at home. Contact a health care provider if: You are having difficulty using the spirometer. You have trouble using the spirometer as often as instructed. Your pain medicine is not giving enough relief for you to  use the spirometer as told. You have a fever. Get help right away if: You develop shortness of breath. You develop a cough with bloody mucus from the lungs. You have fluid or blood coming from an incision site after you cough. Summary An incentive spirometer is a tool that can help you learn to take long, deep breaths to keep your lungs clear and active. You may be asked to use a spirometer after a surgery, if you have a lung problem or a history of smoking, or if you have been inactive for a long period of time. Use your incentive spirometer as instructed every 1-2 hours while you are awake. If you have an incision on your chest or abdomen, place a pillow or a rolled-up towel firmly against your incision when you cough. This will help to reduce pain. Get help right away if you have shortness of breath, you cough up bloody mucus, or blood comes from your incision when you cough. This information is not intended to replace advice given to you by your health care provider. Make sure you discuss any questions you have with your health care provider. Document Revised: 06/27/2019 Document Reviewed: 06/27/2019 Elsevier Patient Education  2023 [employer].

## 2024-03-23 ENCOUNTER — Telehealth: Payer: Self-pay | Admitting: Pharmacy Technician

## 2024-03-23 ENCOUNTER — Ambulatory Visit: Attending: Medical | Admitting: Medical

## 2024-03-23 ENCOUNTER — Encounter: Payer: Self-pay | Admitting: Medical

## 2024-03-23 ENCOUNTER — Other Ambulatory Visit (HOSPITAL_COMMUNITY): Payer: Self-pay

## 2024-03-23 VITALS — BP 128/60 | HR 71 | Ht 75.0 in | Wt 269.6 lb

## 2024-03-23 DIAGNOSIS — I739 Peripheral vascular disease, unspecified: Secondary | ICD-10-CM

## 2024-03-23 DIAGNOSIS — I4821 Permanent atrial fibrillation: Secondary | ICD-10-CM

## 2024-03-23 DIAGNOSIS — E782 Mixed hyperlipidemia: Secondary | ICD-10-CM | POA: Diagnosis not present

## 2024-03-23 DIAGNOSIS — Z01810 Encounter for preprocedural cardiovascular examination: Secondary | ICD-10-CM

## 2024-03-23 DIAGNOSIS — I5032 Chronic diastolic (congestive) heart failure: Secondary | ICD-10-CM

## 2024-03-23 DIAGNOSIS — N289 Disorder of kidney and ureter, unspecified: Secondary | ICD-10-CM

## 2024-03-23 DIAGNOSIS — I10 Essential (primary) hypertension: Secondary | ICD-10-CM

## 2024-03-23 NOTE — Progress Notes (Signed)
 Cardiology Office Note   Date:  03/23/2024  ID:  Loretta Kluender, DOB 1950-07-29, MRN [id number] PCP: Cleotilde Oneil FALCON, MD  [facility] Providers Cardiologist:  Evalene Lunger, MD   History of Present Illness Kristopher Oneal is a 73 y.o. male with a h/o permanent Afib, DM2, HTN, HLD, PAD s/p angioplasty of the left posterior tiial artery and left tibioperoneal trunk in 01/2023 followed by vascular surgery, renal dysfunction, and obesity who presents for follow-up of HFpEF and Afib.   Patient's A-fib dates back to 2020.  Echo at that time showed an EF of 50%, normal wall motion, mildly enlarged left atrium, trivial aortic insufficiency and mitral regurg, and normal RV systolic function.  He was admitted in 10/2021 with worsening shortness of breath with acute on chronic HFpEF and A-fib with RVR.  Echo in 11/2021 showed an EF of 60 to 65%, no regional wall motion abnormalities, mild LVH, normal RV systolic function and ventricular cavity size, aortic valve sclerosis without evidence of stenosis, estimated right atrial pressure of 3 mmHg, borderline dilatation of the aortic root measuring 38 mm, and a rhythm of A-fib.  He was last seen in the office in 06/2022 with no changes indicated at that time.  The patient was last seen 10/2023 and was doing well from a cardiac perspective.   Today, patient is needing a pre-op visit for back surgery. He had a back operation in 1993, and has a lot of scar tissue that needs to be cleaned out, as well as arthritis. He has nerve pain and leg pain, and he is unable to walk more than 5 feet before he feels fatigue and SOB.  He stopped Eliquis  due to financial reasons around August. He is still taking Plavix . He has chronic shortness of breath. He denies chest pain. Neurology wanted patient to re-do sleep study, says he could not complete the last one. He takes a diuretic daily and has no swelling.   Studies Reviewed EKG Interpretation Date/Time:  Wednesday March 23 2024  10:27:26 EST Ventricular Rate:  71 PR Interval:    QRS Duration:  74 QT Interval:  410 QTC Calculation: 445 R Axis:   23  Text Interpretation: Atrial fibrillation When compared with ECG of 21-Oct-2023 13:35, No significant change was found Confirmed by Franchester, Flossie Wexler ([id number]) on 03/23/2024 10:30:51 AM    2D echo 12/02/2021: 1. Left ventricular ejection fraction, by estimation, is 60 to 65%. The  left ventricle has normal function. The left ventricle has no regional  wall motion abnormalities. There is mild left ventricular hypertrophy.  Left ventricular diastolic parameters  are indeterminate.   2. Right ventricular systolic function is normal. The right ventricular  size is normal.   3. The mitral valve is normal in structure. No evidence of mitral valve  regurgitation. No evidence of mitral stenosis.   4. The aortic valve is normal in structure. Aortic valve regurgitation is  not visualized. Aortic valve sclerosis/calcification is present, without  any evidence of aortic stenosis.   5. The inferior vena cava is normal in size with greater than 50%  respiratory variability, suggesting right atrial pressure of 3 mmHg.   6. Rhythm is atrial fibrillation   7. There is borderline dilatation of the aortic root, measuring 38 mm.      Physical Exam VS:  BP 128/60 (BP Location: Left Arm, Patient Position: Sitting, Cuff Size: Normal)   Pulse 71   Ht 6' 3 (1.905 m)   Wt 269 lb 9.6 oz (  122.3 kg)   SpO2 95%   BMI 33.70 kg/m        Wt Readings from Last 3 Encounters:  03/23/24 269 lb 9.6 oz (122.3 kg)  03/22/24 267 lb (121.1 kg)  03/02/24 273 lb 12.8 oz (124.2 kg)    GEN: Well nourished, well developed in no acute distress NECK: No JVD; No carotid bruits CARDIAC: Irreg Irreg, no murmurs, rubs, gallops RESPIRATORY:  Clear to auscultation without rales, wheezing or rhonchi  ABDOMEN: Soft, non-tender, non-distended EXTREMITIES:  No edema; No deformity   ASSESSMENT AND  PLAN  HFpEF Patient is euvolemic on exam. He reports fatigue and DOE from back issues. He takes Dyazide  37.5-25 mg every evening.  Permanent Afib EKG shows rate controlled Afib. He stopped Eliquis  in August due to financial reasons.  I will contact pharmacy to see what options we have.  Continue rate control with Toprol  100 mg daily and Cardizem  300 mg daily.  HTN Blood pressure today is good at 128/60.  Continue Toprol  100 mg daily and Cardizem  300 mg daily.  HLD LDL 67.  Continue lovastatin  40 mg daily.  PAD S/p intervention by vascular surgery in 01/2023. Follow-up ABIs were normal.  Continue Plavix  75 mg daily and statin therapy.  Renal dysfunction Most recent labs showed serum creatinine 1.3, GFR 57, BUN 24.  Pre-operative cardiac evaluation for back surgery Patient is requiring a back surgery scheduled for March 31, 2024.  Patient had prior back surgery in 1993 and has subsequent scar tissue causing nerve pain, leg pain, and fatigue on exertion.  Patient is overall stable from a cardiac perspective.  He is euvolemic.  EKG shows rate controlled A-fib.  Blood pressure and heart rate are stable.  He denies any chest pain.  He has chronic unchanged dyspnea on exertion.  Patient is no longer on Eliquis  as above due to financial reasons-I will look further into this as he should be on a DOAC.  Patient is on Plavix  for vascular disease, instructions pertaining to surgery have been given to the patient.  Due to back issues, patient is barely able to walk 5-10 steps before he gets tired. No further cardiac work-up required prior to surgery. METS<4 RCRI = 10% risk of MACE    Dispo: Follow-up in 1 year  Signed, Quana Chamberlain VEAR Fishman, PA-C

## 2024-03-23 NOTE — Patient Instructions (Signed)
 Medication Instructions:  Your physician recommends that you continue on your current medications as directed. Please refer to the Current Medication list given to you today.   *If you need a refill on your cardiac medications before your next appointment, please call your pharmacy*  Lab Work: None ordered at this time   Follow-Up: At [facility], you and your health needs are our priority.  As part of our continuing mission to provide you with exceptional heart care, our providers are all part of one team.  This team includes your primary Cardiologist (physician) and Advanced Practice Providers or APPs (Physician Assistants and Nurse Practitioners) who all work together to provide you with the care you need, when you need it.  Your next appointment:   1 year(s)  Provider:   You may see Timothy Gollan, MD or Cadence Franchester, PA-C

## 2024-03-23 NOTE — Telephone Encounter (Signed)
   Eliquis  is 89.51 for 30 days   Xarelto  is 89.51 for 30 days

## 2024-03-25 ENCOUNTER — Telehealth: Payer: Self-pay

## 2024-03-25 NOTE — Telephone Encounter (Signed)
 Wife Thomasine) returned RN's call.

## 2024-03-25 NOTE — Telephone Encounter (Signed)
 Attempted to contact pt regarding the below recommendations. Left message to call back.   Franchester, Cadence H, PA-C  Desiderio Russell SAILOR, RN Can we please call the patient and let them know pharmacy said Eliquis  is 89.51 for 30 days and see if he is willing to restart after surgery?

## 2024-03-25 NOTE — Telephone Encounter (Signed)
 Spoke with pt's wife who stated she will discuss with pt and give us  a call back.

## 2024-03-28 ENCOUNTER — Encounter: Payer: Self-pay | Admitting: Neurosurgery

## 2024-03-28 NOTE — Progress Notes (Signed)
 Perioperative / Anesthesia Services  Pre-Admission Testing Clinical Review / Pre-Operative Anesthesia Consult  Date: 03/28/24  PATIENT DEMOGRAPHICS: Name: Kristopher Oneal DOB: 15-Nov-1950 MRN:   [id number]  Note: Available PAT nursing documentation and vital signs have been reviewed. Clinical nursing staff has updated patient's PMH/PSHx, current medication list, and drug allergies/intolerances to ensure complete and comprehensive history available to assist care teams in MDM as it pertains to the aforementioned surgical procedure and anticipated anesthetic course. Extensive review of available clinical information personally performed. Nursing documentation reviewed. Jette PMH and PSHx updated with any diagnoses and/or procedures that I have knowledge of that may have been inadvertently omitted during his intake with the pre-admission testing department's nursing staff.  PLANNED SURGICAL PROCEDURE(S):   Case: [id number] Date/Time: 03/31/24 1014   Procedure: Lumbar laminectomy, medial facetectomy and lateral recess decompression, L4-5, midline approach   Anesthesia type: General   Diagnosis:      Spinal stenosis of lumbar region with neurogenic claudication [M48.062]     Ambulatory dysfunction [R26.2]   Pre-op diagnosis: Severe lumbar claudication   Location: [facility] OR ROOM 03 / [facility]   Surgeons: Claudene Penne ORN, MD        CLINICAL DISCUSSION: Kristopher Oneal is a 73 y.o. male who is submitted for pre-surgical anesthesia review and clearance prior to him undergoing the above procedure. Patient is a Former Smoker (quit 04/1978). 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  Patient is followed by  cardiology (Gollan, MD). He was last seen in the cardiology clinic on 03/23/2024; notes reviewed. At the time of his clinic visit, patient complaining of fatigue and shortness of breath. Patient denied any chest pain, PND, orthopnea, palpitations, significant peripheral edema, weakness, vertiginous symptoms, or presyncope/syncope. Patient with a past medical history significant for cardiovascular diagnoses. Documented physical exam was grossly benign, providing no evidence of acute exacerbation and/or decompensation of the patient's known cardiovascular conditions.  Most recent TTE performed on 12/02/2021 revealed a normal left ventricular systolic function with an EF of 60-65%. There was mild LVH.  There were no regional wall motion abnormalities. Left ventricular diastolic Doppler parameters were indeterminate. Right ventricular size and function normal.  Aortic valve sclerosis/calcification present.  There was no significant valvular regurgitation.  All transvalvular gradients were noted to be normal providing no evidence of hemodynamically significant valvular stenosis.  Aortic root borderline dilated measuring up to 38 mm.  CT imaging of the brain performed on 12/13/2023 revealed chronic subacute cortical infarcts in both the LEFT parietal and RIGHT frontal lobes.  Evidence of microhemorrhages were noted on previous imaging performed in 2022.  Patient with no significant appreciable deficits following neurological event.  Patient with an atrial fibrillation diagnosis; CHA2DS2-VASc Score = 8 (age x 2, HFpEF, HTN, CVA x 2, vascular disease, T2DM). His rate and rhythm are currently being maintained on oral diltiazem  + metoprolol  succinate.  Currently not taking any type of oral anticoagulation therapy.  Due to financial constraints, patient self discontinued his apixaban  and 11/2023.  Patient does take clopidogrel  for his PAD diagnosis.  Blood pressure well-controlled at 128/60 mmHg on currently prescribed  beta-blocker (metoprolol  succinate) and diuretic (triamterene -HCTZ) therapies.  Patient is on lovastatin  for his HLD diagnosis and further ASCVD prevention.  T2DM loosely controlled on currently prescribed regimen; last  HgbA1c was 7.6% when checked on 01/26/2024.  Patient does not have an OSAH diagnosis. Patient is able to complete all of his  ADL/IADLs without cardiovascular limitation.  Per the DASI, patient is able to achieve at least 4 METS of physical activity without experiencing any significant degree of angina/anginal equivalent symptoms. No changes were made to his medication regimen during his visit with cardiology.  Patient scheduled to follow-up with outpatient cardiology in 12 months or sooner if needed.  Kristopher Oneal is scheduled for an elective LUMBAR LAMINECTOMY, MEDIAL FACETECTOMY AND LATERAL RECESS DECOMPRESSION, L4-5, MIDLINE APPROACH on 03/31/2024 with Dr. [name], MD. Given patient's past medical history significant for cardiovascular diagnoses, presurgical cardiac clearance was sought by the PAT team. Per cardiology, METS<4 RCRI = 10% risk of MACE.Based ACC/AHA guidelines, the patient's past medical history, and the amount of time since his last clinic visit, this patient would be at an overall ACCEPTABLE risk for the planned procedure without further cardiovascular testing or intervention at this time.   In review of the patient's medication reconciliation, it is noted that he is on daily oral antithrombotic therapy. He has been instructed on recommendations for holding his clopidogrel  for 7 days prior to his procedure with plans to restart as soon as postoperative bleeding risk felt to be minimized by his primary attending surgeon. The patient has been instructed that his last dose should be on 03/23/2024.  Patient denies previous perioperative complications with anesthesia in the past. In review his EMR, it is noted that patient underwent a general anesthetic course here at  [facility] (AS[facility]) in 01/2023 without documented complications.   MOST RECENT VITAL SIGNS:    03/23/2024   10:16 AM 03/22/2024   10:35 AM 03/02/2024    9:55 AM  Vitals with BMI  Height 6' 3 6' 2   Weight 269 lbs 10 oz 267 lbs 273 lbs 13 oz  BMI 33.7 34.27   Systolic 128 118 863  Diastolic 60 65 84  Pulse 71 69    PROVIDERS/SPECIALISTS: NOTE: Primary physician provider listed below. Patient may have been seen by APP or partner within same practice.   PROVIDER ROLE / SPECIALTY LAST OV  Sharps Penne LELON, MD Neurosurgery (Surgeon) 03/02/2024  Cleotilde Oneil FALCON, MD Primary Care Provider 02/22/2024  Perla Lye, MD Cardiology 03/23/2024  Cherilyn Ned, MD Endocrinology 02/01/2024  Tobie Solo, MD Rheumatology 12/28/2023  Jama Hacker, MD Vascular Surgery 10/20/2023   ALLERGIES: Allergies  Allergen Reactions   Gabapentin  Other (See Comments)    Confusion    CURRENT HOME MEDICATIONS: No current facility-administered medications for this encounter.    clopidogrel  (PLAVIX ) 75 MG tablet   diltiazem  (CARTIA  XT) 300 MG 24 hr capsule   escitalopram (LEXAPRO) 10 MG tablet   glipiZIDE  (GLUCOTROL ) 5 MG tablet   hydroxychloroquine  (PLAQUENIL ) 200 MG tablet   insulin  NPH-regular Human (HUMULIN  70/30) (70-30) 100 UNIT/ML injection   lamoTRIgine (LAMICTAL) 25 MG tablet   lovastatin  (MEVACOR ) 40 MG tablet   memantine (NAMENDA) 5 MG tablet   metFORMIN  (GLUCOPHAGE ) 1000 MG tablet   metoprolol  succinate (TOPROL -XL) 100 MG 24 hr tablet   Multiple Vitamin (MULTIVITAMIN WITH MINERALS) TABS tablet   pantoprazole  (PROTONIX ) 40 MG tablet   triamterene -hydrochlorothiazide  (DYAZIDE ) 37.5-25 MG capsule   Vitamin D, Ergocalciferol, (DRISDOL) 1.25 MG (50000 UNIT) CAPS capsule   Continuous Blood Gluc Sensor (FREESTYLE LIBRE 14 DAY SENSOR) MISC   HISTORY: Past Medical History:  Diagnosis Date   (HFpEF)  heart failure with preserved ejection fraction  (HCC)    Adrenal adenoma, left 12/29/2023   a.) Lumbar MRI 12/29/2023: 3.0 x 3.7 cm   Anxiety    Aortic atherosclerosis    Arthritis    Atrial fibrillation (HCC)    a.) CHA2DS2VASc = 8 (age x2, HFpEF, HTN, CVA x 2, vascular disease, T2DM) as of 03/28/2024; b.) rate/rhythm maintained on oral diltiazem  + metropolol succinate; no OAC (self discontinued in 11/2023 due to financial contraints); does take clopidogrel    Cerebral microvascular disease    CKD (chronic kidney disease), stage III (HCC)    CVA (cerebral vascular accident) (HCC)    a.) noted on brain MRI 01/13/2024: cortical infarcts in both the LEFT parietal and RIGHT frontal lobes   GERD (gastroesophageal reflux disease)    Heart murmur    High cholesterol    Hypertension    Inflammatory arthritis    a.) on long term hydroxychloroquine    Long term current use of clopidogrel     Long-term use of hydroxychloroquine     a.) used for inflammatory arthritits Dx   Mood disturbance    PAD (peripheral artery disease)    Spinal stenosis of lumbar region with neurogenic claudication    T2DM (type 2 diabetes mellitus) (HCC)    Vascular dementia (HCC)    a.) on NMDA receptor antagonist (memantine)   Vitamin D deficiency    Past Surgical History:  Procedure Laterality Date   AMPUTATION TOE Left 02/10/2023   Procedure: Left Hallux Amputation;  Surgeon: Neill Boas, DPM;  Location: [facility] ORS;  Service: Orthopedics/Podiatry;  Laterality: Left;   BACK SURGERY     l5/s1   COLONOSCOPY     KNEE ARTHROPLASTY Right 01/02/2020   Procedure: COMPUTER ASSISTED TOTAL KNEE ARTHROPLASTY;  Surgeon: Mardee Lynwood SQUIBB, MD;  Location: [facility] ORS;  Service: Orthopedics;  Laterality: Right;   KNEE ARTHROPLASTY Left 11/19/2020   Procedure: COMPUTER ASSISTED TOTAL KNEE ARTHROPLASTY;  Surgeon: Mardee Lynwood SQUIBB, MD;  Location: [facility] ORS;  Service: Orthopedics;  Laterality: Left;   LOWER EXTREMITY ANGIOGRAPHY Left 02/10/2023   Procedure: Lower Extremity Angiography;   Surgeon: Jama Cordella MATSU, MD;  Location: [facility] INVASIVE CV LAB;  Service: Cardiovascular;  Laterality: Left;   ORIF PATELLA Left 04/16/2021   Procedure: OPEN REDUCTION INTERNAL (ORIF) FIXATION PATELLA;  Surgeon: Mardee Lynwood SQUIBB, MD;  Location: [facility] ORS;  Service: Orthopedics;  Laterality: Left;   ORIF PATELLA Left 05/10/2021   Procedure: OPEN REDUCTION INTERNAL (ORIF) FIXATION PATELLA;  Surgeon: Mardee Lynwood SQUIBB, MD;  Location: [facility] ORS;  Service: Orthopedics;  Laterality: Left;   Family History  Problem Relation Age of Onset   Dementia Mother    Heart attack Father    Emphysema Father    Heart attack Brother    Social History   Tobacco Use   Smoking status: Former    Current packs/day: 0.00    Types: Cigarettes    Quit date: 1980    Years since quitting: 45.9   Smokeless tobacco: Never   Tobacco comments:    1980  Substance Use Topics   Alcohol use: Yes    Comment: rarely   LABS:  Hospital Outpatient Visit on 03/22/2024  Component Date Value Ref Range Status   ABO/RH(D) 03/22/2024 A POS   Final   Antibody Screen 03/22/2024 NEG   Final   Sample Expiration 03/22/2024 04/05/2024,2359   Final   Extend sample reason 03/22/2024    Final  Value:NO TRANSFUSIONS OR PREGNANCY IN THE PAST 3 MONTHS Performed at [facility], [address] [place], [place] [address]    MRSA, PCR 03/22/2024 NEGATIVE  NEGATIVE Final   Staphylococcus aureus 03/22/2024 NEGATIVE  NEGATIVE Final   Comment: (NOTE) The Xpert SA Assay (FDA approved for NASAL specimens in patients 49 years of age and older), is one component of a comprehensive surveillance program. It is not intended to diagnose infection nor to guide or monitor treatment. Performed at [facility], [address] [place], [place] [address]    Component Ref Range & Units 01/26/2024  [facility] [address] Blood Cell Count) 4.1 - 10.2 10^3/uL 10.1  RBC (Red Blood Cell Count) 4.69 - 6.13 10^6/uL 4.59 Low    Hemoglobin 14.1 - 18.1 gm/dL 85.5  Hematocrit 59.9 - 52.0 % 44.3  MCV (Mean Corpuscular Volume) 80.0 - 100.0 fl 96.5  MCH (Mean Corpuscular Hemoglobin) 27.0 - 31.2 pg 31.4 High   MCHC (Mean Corpuscular Hemoglobin Concentration) 32.0 - 36.0 gm/dL 67.4  Platelet Count 849 - 450 10^3/uL 246  RDW-CV (Red Cell Distribution Width) 11.6 - 14.8 % 13.9  MPV (Mean Platelet Volume) 9.4 - 12.4 fl 11.0  Neutrophils 1.50 - 7.80 10^3/uL 6.77  Lymphocytes 1.00 - 3.60 10^3/uL 1.72  Monocytes 0.00 - 1.50 10^3/uL 1.17  Eosinophils 0.00 - 0.55 10^3/uL 0.33  Basophils 0.00 - 0.09 10^3/uL 0.10 High   Neutrophil % 32.0 - 70.0 % 66.8  Lymphocyte % 10.0 - 50.0 % 17.0  Monocyte % 4.0 - 13.0 % 11.5  Eosinophil % 1.0 - 5.0 % 3.3  Basophil% 0.0 - 2.0 % 1.0  Immature Granulocyte % <=0.7 % 0.4  Immature Granulocyte Count <=0.06 10^3/L 0.04   Component Ref Range & Units 01/26/2024  Glucose 70 - 110 mg/dL 812 High   Sodium 863 - 145 mmol/L 140  Potassium 3.6 - 5.1 mmol/L 4.7  Chloride 97 - 109 mmol/L 106  Carbon Dioxide (CO2) 22.0 - 32.0 mmol/L 22.4  Urea Nitrogen (BUN) 7 - 25 mg/dL 24  Creatinine 0.7 - 1.3 mg/dL 1.3  Glomerular Filtration Rate (eGFR) >60 mL/min/1.73sq m 57 Low   Calcium 8.7 - 10.3 mg/dL 9.6  AST 8 - 39 U/L 19  ALT 6 - 57 U/L 21  Alk Phos (alkaline Phosphatase) 34 - 104 U/L 48  Albumin  3.5 - 4.8 g/dL 4.2  Bilirubin, Total 0.3 - 1.2 mg/dL 0.6  Protein, Total 6.1 - 7.9 g/dL 7.0  A/G Ratio 1.0 - 5.0 gm/dL 1.5   Component Ref Range & Units 01/26/2024  Hemoglobin A1C 4.2 - 5.6 % 7.6 High   Average Blood Glucose (Calc) mg/dL 828    ECG: Date: 87/96/7974  Time ECG obtained: 1027 AM Rate: 71 bpm Rhythm: atrial fibrillation Axis (leads I and aVF): normal Intervals: QRS 74 ms. QTc 445 ms. ST segment and T wave changes: No evidence of acute T wave abnormalities or significant ST segment elevation or depression.  Evidence of a possible, age  undetermined, prior infarct:  No Comparison: Similar to previous tracing obtained on 10/21/2023   IMAGING / PROCEDURES: DG LUMBAR SPINE COMPLETE performed on 03/02/2024 5 mm grade 1 retrolisthesis of L2 on L3, decreasing to 3 mm with flexion. Mild dextrorotary scoliosis with the apex at L3. Chronic L5-S1 disc collapse with normal disc heights above L5. Prominent marginal osteophytes. DJD. Heavily calcified abdominal aorta and common iliac arteries.  VAS US  ABI WITH/WO TBI performed on 10/20/2023 Resting right ankle-brachial index is within normal range.  The right toe-brachial index is normal.  Resting left ankle-brachial index is within normal range. The left toe-brachial index is normal.    TRANSTHORACIC ECHOCARDIOGRAM performed on 18/14/2023 Left ventricular ejection fraction, by estimation, is 60 to 65%. The  left ventricle has normal function. The left ventricle has no regional  wall motion abnormalities. There is mild left ventricular hypertrophy.  Left ventricular diastolic parameters  are indeterminate.  Right ventricular systolic function is normal. The right ventricular size is normal.  The mitral valve is normal in structure. No evidence of mitral valve regurgitation. No evidence of mitral stenosis.  The aortic valve is normal in structure. Aortic valve regurgitation is not visualized. Aortic valve sclerosis/calcification is present, without any evidence of aortic stenosis.  The inferior vena cava is normal in size with greater than 50% respiratory variability, suggesting right atrial pressure of 3 mmHg.  Rhythm is atrial fibrillation There is borderline dilatation of the aortic root, measuring 38 mm.   MR BRAIN WO CONTRAST performed on 01/13/2024 Evidence of progressed small and medium-sized vessel ischemia since the 2022 MRI, including small subacute appearing cortical infarcts in both the left parietal and right frontal lobes. Consider a vascular etiology of dementia in this  setting.  No intracranial hemorrhage or mass effect.   MR ABDOMEN WWO CONTRAST performed on 01/13/2024 Large macroscopic fat containing left adrenal adenoma measuring 4.1 x 4.0 cm. No acute findings in the abdomen. Aortic atherosclerosis  MR LUMBAR SPINE WO CONTRAST performed on 12/29/2023 4.0 x 3.7 cm left adrenal mass cannot be characterized on this examination. Recommend adrenal protocol MRI with and without contrast for further evaluation. 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  IMPRESSION AND PLAN: Antiono Ettinger has been referred for pre-anesthesia review and clearance prior to him undergoing the planned anesthetic and procedural courses. Available labs, pertinent testing, and imaging results were personally reviewed by me in preparation for upcoming operative/procedural course. [facility] medical record has been updated following extensive record review and patient interview with PAT staff.   This patient has been appropriately cleared by cardiology with an overall ACCEPTABLE risk of patient experiencing significant perioperative cardiovascular complications. here at [facility]. Based on clinical review performed today (03/28/24), barring any significant acute changes in the patient's overall condition, it is anticipated that he will be able to proceed with the planned surgical intervention. Any acute changes in clinical condition may necessitate his procedure being postponed and/or cancelled. Patient will meet with anesthesia team (MD and/or CRNA) on the day of his procedure for preoperative evaluation/assessment. Questions regarding anesthetic course will be fielded at that time.   Pre-surgical instructions were reviewed with the patient during his PAT appointment, and questions were fielded to satisfaction by PAT clinical staff. He has been instructed on  which medications that he will need to hold prior to surgery, as well as the ones that have been deemed safe/appropriate to take on the day of his procedure. As part of the general education provided by PAT, patient made aware both verbally and in writing, that he would need to abstain from the use of any illegal substances during his perioperative course. He was advised that failure to follow the provided instructions could necessitate case cancellation or result in serious perioperative complications up to and including death. Patient encouraged to contact PAT and/or his surgeon's office to discuss any questions or concerns that may arise prior  to surgery; verbalized understanding.   Dorise Pereyra, MSN, APRN, FNP-C, CEN [facility]  Perioperative Services Nurse Practitioner Phone: [phone] Fax: [phone] 03/28/24 1:34 PM  NOTE: This note has been prepared using Dragon dictation software. Despite my best ability to proofread, there is always the potential that unintentional transcriptional errors may still occur from this process.

## 2024-03-31 ENCOUNTER — Other Ambulatory Visit: Payer: Self-pay

## 2024-03-31 ENCOUNTER — Ambulatory Visit

## 2024-03-31 ENCOUNTER — Encounter: Admission: RE | Disposition: A | Payer: Self-pay | Source: Home / Self Care | Attending: Neurosurgery

## 2024-03-31 ENCOUNTER — Encounter: Payer: Self-pay | Admitting: Urgent Care

## 2024-03-31 ENCOUNTER — Ambulatory Visit: Payer: Self-pay | Admitting: Urgent Care

## 2024-03-31 ENCOUNTER — Encounter: Payer: Self-pay | Admitting: Neurosurgery

## 2024-03-31 ENCOUNTER — Ambulatory Visit
Admission: RE | Admit: 2024-03-31 | Discharge: 2024-03-31 | Disposition: A | Discharge status: Home / Self Care | Attending: Neurosurgery | Admitting: Neurosurgery

## 2024-03-31 DIAGNOSIS — E1122 Type 2 diabetes mellitus with diabetic chronic kidney disease: Secondary | ICD-10-CM | POA: Diagnosis not present

## 2024-03-31 DIAGNOSIS — K219 Gastro-esophageal reflux disease without esophagitis: Secondary | ICD-10-CM | POA: Diagnosis not present

## 2024-03-31 DIAGNOSIS — Z7984 Long term (current) use of oral hypoglycemic drugs: Secondary | ICD-10-CM | POA: Diagnosis not present

## 2024-03-31 DIAGNOSIS — R262 Difficulty in walking, not elsewhere classified: Secondary | ICD-10-CM | POA: Insufficient documentation

## 2024-03-31 DIAGNOSIS — E119 Type 2 diabetes mellitus without complications: Secondary | ICD-10-CM

## 2024-03-31 DIAGNOSIS — M48062 Spinal stenosis, lumbar region with neurogenic claudication: Secondary | ICD-10-CM | POA: Diagnosis present

## 2024-03-31 DIAGNOSIS — I130 Hypertensive heart and chronic kidney disease with heart failure and stage 1 through stage 4 chronic kidney disease, or unspecified chronic kidney disease: Secondary | ICD-10-CM | POA: Diagnosis not present

## 2024-03-31 DIAGNOSIS — M48061 Spinal stenosis, lumbar region without neurogenic claudication: Secondary | ICD-10-CM | POA: Diagnosis present

## 2024-03-31 DIAGNOSIS — I5032 Chronic diastolic (congestive) heart failure: Secondary | ICD-10-CM | POA: Diagnosis not present

## 2024-03-31 DIAGNOSIS — Z8673 Personal history of transient ischemic attack (TIA), and cerebral infarction without residual deficits: Secondary | ICD-10-CM | POA: Diagnosis not present

## 2024-03-31 DIAGNOSIS — Z7901 Long term (current) use of anticoagulants: Secondary | ICD-10-CM | POA: Diagnosis not present

## 2024-03-31 DIAGNOSIS — Z794 Long term (current) use of insulin: Secondary | ICD-10-CM | POA: Diagnosis not present

## 2024-03-31 DIAGNOSIS — Z87891 Personal history of nicotine dependence: Secondary | ICD-10-CM | POA: Diagnosis not present

## 2024-03-31 DIAGNOSIS — N183 Chronic kidney disease, stage 3 unspecified: Secondary | ICD-10-CM | POA: Diagnosis not present

## 2024-03-31 DIAGNOSIS — E1151 Type 2 diabetes mellitus with diabetic peripheral angiopathy without gangrene: Secondary | ICD-10-CM | POA: Diagnosis not present

## 2024-03-31 DIAGNOSIS — Z01818 Encounter for other preprocedural examination: Secondary | ICD-10-CM

## 2024-03-31 DIAGNOSIS — I482 Chronic atrial fibrillation, unspecified: Secondary | ICD-10-CM | POA: Diagnosis not present

## 2024-03-31 HISTORY — DX: Long term (current) use of antithrombotics/antiplatelets: Z79.02

## 2024-03-31 HISTORY — DX: Vitamin D deficiency, unspecified: E55.9

## 2024-03-31 HISTORY — DX: Cerebral infarction, unspecified: I63.9

## 2024-03-31 HISTORY — DX: Vascular dementia, unspecified severity, without behavioral disturbance, psychotic disturbance, mood disturbance, and anxiety: F01.50

## 2024-03-31 HISTORY — DX: Atherosclerosis of aorta: I70.0

## 2024-03-31 HISTORY — DX: Chronic kidney disease, stage 3 unspecified: N18.30

## 2024-03-31 HISTORY — DX: Type 2 diabetes mellitus without complications: E11.9

## 2024-03-31 HISTORY — DX: Unspecified diastolic (congestive) heart failure: I50.30

## 2024-03-31 HISTORY — DX: Other cerebrovascular disease: I67.89

## 2024-03-31 HISTORY — DX: Unspecified atrial fibrillation: I48.91

## 2024-03-31 HISTORY — DX: Other long term (current) drug therapy: Z79.899

## 2024-03-31 HISTORY — DX: Emotional lability: R45.86

## 2024-03-31 HISTORY — DX: Spinal stenosis, lumbar region with neurogenic claudication: M48.062

## 2024-03-31 HISTORY — DX: Other specified arthritis, unspecified site: M13.80

## 2024-03-31 HISTORY — PX: DECOMPRESSIVE LUMBAR LAMINECTOMY LEVEL 1: SHX5791

## 2024-03-31 LAB — GLUCOSE, CAPILLARY
Glucose-Capillary: 152 mg/dL — ABNORMAL HIGH (ref 70–99)
Glucose-Capillary: 167 mg/dL — ABNORMAL HIGH (ref 70–99)

## 2024-03-31 SURGERY — DECOMPRESSIVE LUMBAR LAMINECTOMY LEVEL 1
Anesthesia: General

## 2024-03-31 MED ORDER — CHLORHEXIDINE GLUCONATE 0.12 % MT SOLN
OROMUCOSAL | Status: AC
  Filled 2024-03-31: qty 15

## 2024-03-31 MED ORDER — CHLORHEXIDINE GLUCONATE 0.12 % MT SOLN
15.0000 mL | Freq: Once | OROMUCOSAL | Status: AC
  Administered 2024-03-31: 15 mL via OROMUCOSAL

## 2024-03-31 MED ORDER — SUGAMMADEX SODIUM 200 MG/2ML IV SOLN
INTRAVENOUS | Status: DC | PRN
  Administered 2024-03-31: 100 mg via INTRAVENOUS
  Administered 2024-03-31: 200 mg via INTRAVENOUS

## 2024-03-31 MED ORDER — FENTANYL CITRATE (PF) 100 MCG/2ML IJ SOLN
INTRAMUSCULAR | Status: AC
  Filled 2024-03-31: qty 2

## 2024-03-31 MED ORDER — SODIUM CHLORIDE (PF) 0.9 % IJ SOLN
INTRAMUSCULAR | Status: DC | PRN
  Administered 2024-03-31: 30 mL

## 2024-03-31 MED ORDER — SODIUM CHLORIDE 0.9 % IV SOLN: INTRAVENOUS | Status: DC

## 2024-03-31 MED ORDER — SURGIFLO WITH THROMBIN (HEMOSTATIC MATRIX KIT) OPTIME
TOPICAL | Status: DC | PRN
  Administered 2024-03-31: 1 via TOPICAL

## 2024-03-31 MED ORDER — LIDOCAINE HCL (CARDIAC) PF 100 MG/5ML IV SOSY
PREFILLED_SYRINGE | INTRAVENOUS | Status: DC | PRN
  Administered 2024-03-31: 80 mg via INTRAVENOUS

## 2024-03-31 MED ORDER — METHOCARBAMOL 500 MG PO TABS
500.0000 mg | ORAL_TABLET | Freq: Three times a day (TID) | ORAL | 2 refills | Status: DC | PRN
  Filled 2024-03-31: qty 60, 20d supply, fill #0

## 2024-03-31 MED ORDER — ONDANSETRON HCL 4 MG/2ML IJ SOLN
INTRAMUSCULAR | Status: DC | PRN
  Administered 2024-03-31: 4 mg via INTRAVENOUS

## 2024-03-31 MED ORDER — BUPIVACAINE-EPINEPHRINE (PF) 0.5% -1:200000 IJ SOLN
INTRAMUSCULAR | Status: DC | PRN
  Administered 2024-03-31: 10 mL

## 2024-03-31 MED ORDER — 0.9 % SODIUM CHLORIDE (POUR BTL) OPTIME
TOPICAL | Status: DC | PRN
  Administered 2024-03-31: 200 mL

## 2024-03-31 MED ORDER — CEFAZOLIN IN SODIUM CHLORIDE 2-0.9 GM/100ML-% IV SOLN
3.0000 g | Freq: Once | INTRAVENOUS | Status: AC
  Administered 2024-03-31: 2 g via INTRAVENOUS
  Filled 2024-03-31: qty 200

## 2024-03-31 MED ORDER — ORAL CARE MOUTH RINSE: 15.0000 mL | Freq: Once | OROMUCOSAL | Status: AC

## 2024-03-31 MED ORDER — ROCURONIUM BROMIDE 100 MG/10ML IV SOLN
INTRAVENOUS | Status: DC | PRN
  Administered 2024-03-31: 20 mg via INTRAVENOUS
  Administered 2024-03-31: 50 mg via INTRAVENOUS

## 2024-03-31 MED ORDER — FENTANYL CITRATE (PF) 100 MCG/2ML IJ SOLN
25.0000 ug | INTRAMUSCULAR | Status: DC | PRN
  Administered 2024-03-31 (×2): 25 ug via INTRAVENOUS
  Administered 2024-03-31: 50 ug via INTRAVENOUS

## 2024-03-31 MED ORDER — PHENYLEPHRINE HCL-NACL 20-0.9 MG/250ML-% IV SOLN
INTRAVENOUS | Status: DC | PRN
  Administered 2024-03-31: 40 ug/min via INTRAVENOUS

## 2024-03-31 MED ORDER — DOCUSATE SODIUM 100 MG PO CAPS
100.0000 mg | ORAL_CAPSULE | Freq: Two times a day (BID) | ORAL | 0 refills | Status: DC
  Filled 2024-03-31: qty 10, 5d supply, fill #0

## 2024-03-31 MED ORDER — DEXAMETHASONE SOD PHOSPHATE PF 10 MG/ML IJ SOLN
INTRAMUSCULAR | Status: DC | PRN
  Administered 2024-03-31: 5 mg via INTRAVENOUS

## 2024-03-31 MED ORDER — PROPOFOL 10 MG/ML IV BOLUS
INTRAVENOUS | Status: AC
  Filled 2024-03-31: qty 20

## 2024-03-31 MED ORDER — DROPERIDOL 2.5 MG/ML IJ SOLN: 0.6250 mg | Freq: Once | INTRAMUSCULAR | Status: DC | PRN

## 2024-03-31 MED ORDER — OXYCODONE HCL 5 MG PO TABS
5.0000 mg | ORAL_TABLET | ORAL | 0 refills | Status: DC | PRN
  Filled 2024-03-31: qty 30, 5d supply, fill #0

## 2024-03-31 MED ORDER — PROPOFOL 10 MG/ML IV BOLUS
INTRAVENOUS | Status: DC | PRN
  Administered 2024-03-31: 130 mg via INTRAVENOUS
  Administered 2024-03-31: 20 mg via INTRAVENOUS

## 2024-03-31 MED ORDER — ONDANSETRON HCL 4 MG/2ML IJ SOLN: INTRAMUSCULAR | Status: DC | PRN

## 2024-03-31 MED ORDER — SENNA 8.6 MG PO TABS
1.0000 | ORAL_TABLET | Freq: Every day | ORAL | 0 refills | Status: DC
  Filled 2024-03-31: qty 120, 120d supply, fill #0

## 2024-03-31 MED ORDER — PHENYLEPHRINE HCL-NACL 20-0.9 MG/250ML-% IV SOLN
INTRAVENOUS | Status: AC
  Filled 2024-03-31: qty 250

## 2024-03-31 MED ORDER — PHENYLEPHRINE 80 MCG/ML (10ML) SYRINGE FOR IV PUSH (FOR BLOOD PRESSURE SUPPORT)
PREFILLED_SYRINGE | INTRAVENOUS | Status: DC | PRN
  Administered 2024-03-31 (×3): 160 ug via INTRAVENOUS

## 2024-03-31 MED ORDER — CEFAZOLIN SODIUM-DEXTROSE 2-4 GM/100ML-% IV SOLN
INTRAVENOUS | Status: AC
  Filled 2024-03-31: qty 100

## 2024-03-31 MED ORDER — ACETAMINOPHEN 500 MG PO TABS
1000.0000 mg | ORAL_TABLET | Freq: Four times a day (QID) | ORAL | 0 refills | Status: DC | PRN
  Filled 2024-03-31: qty 30, 4d supply, fill #0

## 2024-03-31 SURGICAL SUPPLY — 31 items
BASIN KIT SINGLE STR (MISCELLANEOUS) ×1 IMPLANT
BRUSH SCRUB EZ 4% CHG (MISCELLANEOUS) ×1 IMPLANT
BUR NEURO DRILL SOFT 3.0X3.8M (BURR) ×1 IMPLANT
DERMABOND ADVANCED .7 DNX12 (GAUZE/BANDAGES/DRESSINGS) ×1 IMPLANT
DRAPE C-ARM XRAY 36X54 (DRAPES) ×2 IMPLANT
DRAPE LAPAROTOMY 100X77 ABD (DRAPES) ×1 IMPLANT
DRSG OPSITE POSTOP 3X4 (GAUZE/BANDAGES/DRESSINGS) ×1 IMPLANT
DRSG TEGADERM 4X4.75 (GAUZE/BANDAGES/DRESSINGS) IMPLANT
ELECTRODE EZSTD 165MM 6.5IN (MISCELLANEOUS) ×1 IMPLANT
ELECTRODE REM PT RTRN 9FT ADLT (ELECTROSURGICAL) ×1 IMPLANT
GLOVE BIOGEL PI IND STRL 6.5 (GLOVE) IMPLANT
GLOVE BIOGEL PI IND STRL 7.0 (GLOVE) ×1 IMPLANT
GLOVE BIOGEL PI IND STRL 8 (GLOVE) ×2 IMPLANT
GLOVE SURG SYN 6.5 PF PI (GLOVE) IMPLANT
GLOVE SURG SYN 7.0 PF PI (GLOVE) ×1 IMPLANT
GLOVE SURG SYN 7.5 PF PI (GLOVE) ×1 IMPLANT
GOWN SRG XL LVL 3 NONREINFORCE (GOWNS) ×1 IMPLANT
GOWN STRL REUS W/ TWL LRG LVL3 (GOWN DISPOSABLE) ×1 IMPLANT
KIT WILSON FRAME (KITS) ×1 IMPLANT
NDL SAFETY ECLIP 18X1.5 (MISCELLANEOUS) ×1 IMPLANT
NS IRRIG 500ML POUR BTL (IV SOLUTION) ×1 IMPLANT
PACK LAMINECTOMY ARMC (PACKS) ×1 IMPLANT
PAD ARMBOARD POSITIONER FOAM (MISCELLANEOUS) ×2 IMPLANT
STAPLER SKIN PROX 35W (STAPLE) IMPLANT
SURGIFLO W/THROMBIN 8M KIT (HEMOSTASIS) ×1 IMPLANT
SUT MNCRL AB 4-0 PS2 18 (SUTURE) IMPLANT
SUT VIC AB 0 CT1 27XCR 8 STRN (SUTURE) ×1 IMPLANT
SUT VIC AB 2-0 CT1 18 (SUTURE) ×1 IMPLANT
SYR 30ML LL (SYRINGE) ×2 IMPLANT
SYR 3ML LL SCALE MARK (SYRINGE) ×1 IMPLANT
TRAP FLUID SMOKE EVACUATOR (MISCELLANEOUS) ×1 IMPLANT

## 2024-03-31 NOTE — Evaluation (Signed)
 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  He had exhausted his conservative management.  Continued to have worsening tolerance for ambulation.  With his lack of improvement with conservative care chose to go forward with a L4-5 laminectomy. S/p L4/5 laminectomy, medial facetectomy and lateral recess decompression 03/31/24     Clinical Impressions Pt seen for OT evaluation this date, POD0 from above procedure. PTA Pt is generally MOD I-I in ADL, has PRN assist for IADL. Pt amb with SPC. Pt/wife educated in precautions, self care skills, AE, and home/routines modifications to maximize safety and functional independence while minimizing falls risk and maintaining precautions. Pt verbalized understanding of all education/training provided. Able to return demonstration safe techniques while maintaining precautions throughout. Pt amb approx 100' with CGA with HHA. Simulated LB dressing with MIN A. Demo'd use of reacher to pt/wife with no further questions. No additional skilled OT needs at this time. Will discharge in house. Please re consult if there is a change in functional status.     If plan is discharge home, recommend the following:   A little help with walking and/or transfers;A little help with bathing/dressing/bathroom     Functional Status Assessment   Patient has had a recent decline in their functional status and demonstrates the ability to make significant improvements in function in a reasonable and predictable amount of time.     Equipment Recommendations   None recommended by OT     Recommendations for Other Services         Precautions/Restrictions   Precautions Precautions: Back;Fall Precaution Booklet Issued: Yes (comment) Recall of Precautions/Restrictions:  Intact Restrictions Weight Bearing Restrictions Per Provider Order: No     Mobility Bed Mobility               General bed mobility comments: NT    Transfers Overall transfer level: Needs assistance Equipment used: None Transfers: Sit to/from Stand Sit to Stand: Contact guard assist                  Balance Overall balance assessment: Needs assistance Sitting-balance support: Feet supported Sitting balance-Leahy Scale: Normal     Standing balance support: No upper extremity supported Standing balance-Leahy Scale: Fair                             ADL either performed or assessed with clinical judgement   ADL Overall ADL's : Needs assistance/impaired                     Lower Body Dressing: Minimal assistance   Toilet Transfer: Contact guard assist;Ambulation Toilet Transfer Details (indicate cue type and reason): via HHA, simulated         Functional mobility during ADLs: Contact guard assist (approx 100' via HHA, simulate cane)       Vision Patient Visual Report: No change from baseline       Perception         Praxis         Pertinent Vitals/Pain Pain Assessment Pain Assessment: Faces Faces Pain Scale: Hurts a little bit Pain Location: back Pain Descriptors / Indicators: Aching, Discomfort (itchy) Pain Intervention(s): Repositioned, Monitored during session     Extremity/Trunk Assessment Upper Extremity Assessment Upper Extremity Assessment: Overall WFL for tasks assessed (  B hand tremors, wife reports this is baseline)   Lower Extremity Assessment Lower Extremity Assessment: Generalized weakness   Cervical / Trunk Assessment Cervical / Trunk Assessment: Back Surgery   Communication Communication Communication: No apparent difficulties   Cognition Arousal: Alert Behavior During Therapy: WFL for tasks assessed/performed Cognition: History of cognitive impairments (pt wife says he has dementia)                                Following commands: Intact       Cueing  General Comments   Cueing Techniques: Verbal cues  no dizziness reported   Exercises     Shoulder Instructions      Home Living Family/patient expects to be discharged to:: Private residence (RV) Living Arrangements: Spouse/significant other Available Help at Discharge: Family;Available PRN/intermittently Type of Home:  (RV) Home Access: Stairs to enter Entrance Stairs-Number of Steps: 4 to enter with B hand rails. 3 STE bathroom inside RV with single rail.         Bathroom Shower/Tub: Producer, Television/film/video: Handicapped height Bathroom Accessibility: Yes   Home Equipment: Agricultural Consultant (2 wheels);Grab bars - tub/shower;Shower seat;Cane - single point, reacher           Prior Functioning/Environment Prior Level of Function : Independent/Modified Independent             Mobility Comments: amb with SPC      OT Problem List: Decreased activity tolerance;Impaired balance (sitting and/or standing);Decreased strength   OT Treatment/Interventions:        OT Goals(Current goals can be found in the care plan section)   Acute Rehab OT Goals Patient Stated Goal: discharge OT Goal Formulation: With patient/family Time For Goal Achievement: 04/14/24 Potential to Achieve Goals: Good   OT Frequency:       Co-evaluation              AM-PAC OT 6 Clicks Daily Activity     Outcome Measure Help from another person eating meals?: None Help from another person taking care of personal grooming?: None Help from another person toileting, which includes using toliet, bedpan, or urinal?: None Help from another person bathing (including washing, rinsing, drying)?: A Little Help from another person to put on and taking off regular upper body clothing?: None Help from another person to put on and taking off regular lower body clothing?: A Little 6 Click Score: 22   End of Session  Nurse Communication: Mobility status  Activity Tolerance: Patient tolerated treatment well Patient left: in chair;with family/visitor present;with nursing/sitter in room;with call bell/phone within reach  OT Visit Diagnosis: Other abnormalities of gait and mobility (R26.89)                Time: 1450-1500 OT Time Calculation (min): 10 min Charges:  OT General Charges $OT Visit: 1 Visit OT Evaluation $OT Eval Low Complexity: 1 Low  Therisa Sheffield, OTD OTR/L  03/31/2024, 3:15 PM

## 2024-03-31 NOTE — Interval H&P Note (Signed)
 History and Physical Interval Note:  03/31/2024 8:30 AM  Kristopher Oneal  has presented today for surgery, with the diagnosis of Severe lumbar claudication.  The various methods of treatment have been discussed with the patient and family. After consideration of risks, benefits and other options for treatment, the patient has consented to  Procedures: Lumbar laminectomy, medial facetectomy and lateral recess decompression, L4-5, midline approach (N/A) as a surgical intervention.  The patient's history has been reviewed, patient examined, no change in status, stable for surgery.  I have reviewed the patient's chart and labs.  Questions were answered to the patient's satisfaction.    Heart and lungs are clear   [name]

## 2024-03-31 NOTE — Anesthesia Procedure Notes (Signed)
 Procedure Name: Intubation Date/Time: 03/31/2024 10:23 AM  Performed by: Lennie Lamarr HERO, CRNAPre-anesthesia Checklist: Patient identified, Emergency Drugs available, Suction available and Patient being monitored Patient Re-evaluated:Patient Re-evaluated prior to induction Oxygen Delivery Method: Circle System Utilized Preoxygenation: Pre-oxygenation with 100% oxygen Induction Type: IV induction Ventilation: Mask ventilation without difficulty, Oral airway inserted - appropriate to patient size and Two handed mask ventilation required Laryngoscope Size: McGrath and 4 Grade View: Grade I Tube type: Oral Tube size: 7.5 mm Number of attempts: 1 Airway Equipment and Method: Stylet and Oral airway Placement Confirmation: ETT inserted through vocal cords under direct vision, positive ETCO2 and breath sounds checked- equal and bilateral Secured at: 22 cm Tube secured with: Tape Dental Injury: Teeth and Oropharynx as per pre-operative assessment

## 2024-03-31 NOTE — Evaluation (Signed)
 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  He had exhausted his conservative management.  Continued to have worsening tolerance for ambulation.  With his lack of improvement with conservative care chose to go forward with a L4-5 laminectomy. S/p lumbar sx 03/31/24.  Clinical Impression  Pt admitted with above diagnosis. Pt currently with functional limitations due to the deficits listed below (see PT Problem List). Pt received upright in recliner agreeable to PT. Reports PTA being mod-I with SPC due to lumbar stenosis. Indep with ADL's.   TO date, reviewed education as listed below. Able to stand CGA from recliner and ambulate ~100' total with step to pattern and trendelenburg. Pt able to sit in bathroom at supervision and urinate independently. Supervision to return to STS from commode and for hand hygiene. Able to safely complete stair training as well with alternating pattern and rail use. Per spouse pt ambulating at improved quality compared to pre surgery. Pt with all needs in reach with education packet provided on BLT precautions and log roll technique. Pt will benefit from skilled PT services to address gait abnormality, imbalance, and LE weakness when appropriate to reduce falls risk and maximize return to PLOF.      If plan is discharge home, recommend the following: Assistance with cooking/housework;Assist for transportation;Help with stairs or ramp for entrance   Can travel by private vehicle        Equipment Recommendations None recommended by PT  Recommendations for Other Services       Functional Status Assessment Patient has had a recent decline in their functional status and demonstrates the ability to make significant improvements in function in a reasonable and predictable amount of time.      Precautions / Restrictions Precautions Precautions: Back;Fall Precaution Booklet Issued: Yes (comment) Recall of Precautions/Restrictions: Intact Restrictions Weight Bearing Restrictions Per Provider Order: No      Mobility  Bed Mobility               General bed mobility comments: NT. No bed present in bay. Patient Response: Cooperative  Transfers Overall transfer level: Needs assistance Equipment used: None Transfers: Sit to/from Stand Sit to Stand: Contact guard assist, Supervision           General transfer comment: CGA from recliner. Supervision from toilet.    Ambulation/Gait Ambulation/Gait assistance: Supervision Gait Distance (Feet): 100 Feet Assistive device: None Gait Pattern/deviations: Step-to pattern, Trendelenburg       General Gait Details: Step to pattern with trendelenburg gait. Per spouse gait improved since lumbar surgery.  Stairs Stairs: Yes Stairs assistance: Supervision Stair Management: One rail Right, One rail Left, Alternating pattern, Forwards Number of Stairs: 4 General stair comments: asc/desc safely with described pattern above  Wheelchair Mobility     Tilt Bed Tilt Bed Patient Response: Cooperative  Modified Rankin (Stroke Patients Only)       Balance Overall balance assessment: Mild deficits observed, not formally tested                                           Pertinent Vitals/Pain Pain Assessment Pain Assessment: Faces Faces Pain Scale: Hurts a little bit Pain Location: lumbar incision Pain Descriptors / Indicators: Aching, Discomfort Pain Intervention(s): Limited activity within patient's  tolerance, Monitored during session, Repositioned    Home Living Family/patient expects to be discharged to:: Private residence (RV) Living Arrangements: Spouse/significant other Available Help at Discharge: Family;Available PRN/intermittently (wife for 2-3 days.) Type of Home:  (RV) Home  Access: Stairs to enter   Entrance Stairs-Number of Steps: 4 to enter with B hand rails. 3 STE bathroom inside RV with single rail.     Home Equipment: Rolling Walker (2 wheels);Grab bars - tub/shower;Shower seat;Cane - single point      Prior Function Prior Level of Function : Independent/Modified Independent                     Extremity/Trunk Assessment   Upper Extremity Assessment Upper Extremity Assessment: Defer to OT evaluation    Lower Extremity Assessment Lower Extremity Assessment: Generalized weakness    Cervical / Trunk Assessment Cervical / Trunk Assessment: Kyphotic;Back Surgery  Communication   Communication Communication: No apparent difficulties    Cognition Arousal: Alert Behavior During Therapy: WFL for tasks assessed/performed   PT - Cognitive impairments: No apparent impairments                         Following commands: Intact       Cueing Cueing Techniques: Verbal cues     General Comments      Exercises Other Exercises Other Exercises: benefits of OPPT, log roll technique, BLT precautions   Assessment/Plan    PT Assessment All further PT needs can be met in the next venue of care  PT Problem List Decreased strength;Decreased balance       PT Treatment Interventions      PT Goals (Current goals can be found in the Care Plan section)  Acute Rehab PT Goals Patient Stated Goal: to go home PT Goal Formulation: With patient/family Time For Goal Achievement: 04/14/24 Potential to Achieve Goals: Good    Frequency       Co-evaluation               AM-PAC PT 6 Clicks Mobility  Outcome Measure Help needed turning from your back to your side while in a flat bed without using bedrails?: A Little Help needed moving from lying on your back to sitting on the side of a flat bed without using bedrails?: A Little Help needed moving to and from a bed to a chair (including a wheelchair)?: A Little Help needed  standing up from a chair using your arms (e.g., wheelchair or bedside chair)?: A Little Help needed to walk in hospital room?: A Little Help needed climbing 3-5 steps with a railing? : A Little 6 Click Score: 18    End of Session Equipment Utilized During Treatment: Gait belt Activity Tolerance: Patient tolerated treatment well Patient left: in chair;with family/visitor present Nurse Communication: Mobility status PT Visit Diagnosis: Unsteadiness on feet (R26.81);Other abnormalities of gait and mobility (R26.89);Muscle weakness (generalized) (M62.81)    Time: 8640-8584 PT Time Calculation (min) (ACUTE ONLY): 16 min   Charges:   PT Evaluation $PT Eval Low Complexity: 1 Low   PT General Charges $$ ACUTE PT VISIT: 1 Visit        Dorina HERO. Fairly IV, PT, DPT Physical Therapist- [facility] 03/31/2024, 2:27 PM

## 2024-03-31 NOTE — Anesthesia Preprocedure Evaluation (Signed)
 Anesthesia Evaluation  Patient identified by MRN, date of birth, ID band Patient awake    Reviewed: Allergy & Precautions, NPO status , Patient's Chart, lab work & pertinent test results  History of Anesthesia Complications Negative for: history of anesthetic complications  Airway Mallampati: II   Neck ROM: Full    Dental  (+) Dental Advidsory Given, Caps Bridges and crowns:   Pulmonary former smoker   Pulmonary exam normal breath sounds clear to auscultation       Cardiovascular hypertension, (-) angina + Peripheral Vascular Disease and +CHF  (-) Past MI and (-) Cardiac Stents + dysrhythmias Atrial Fibrillation + Valvular Problems/Murmurs  Rhythm:Regular Rate:Normal  ECG 02/05/23: A fib with RVR, HR 128 (NOTE HR has been in 70s-80s for greater than 24 hours)   Neuro/Psych neg Seizures PSYCHIATRIC DISORDERS Anxiety    Dementia CVA    GI/Hepatic ,GERD  Medicated and Controlled,,  Endo/Other  diabetes, Type 2, Insulin  Dependent  Obesity   Renal/GU Renal disease (CKD stage 3)     Musculoskeletal  (+) Arthritis ,    Abdominal   Peds  Hematology negative hematology ROS (+)   Anesthesia Other Findings Cardiology note 06/24/22:  Atrial fibrillation, permanent Dating back to 2020 per EKGs Recommend he continue rate control medications including diltiazem  ER 300 mg daily and metoprolol  succinate 100 daily, Eliquis  5 twice daily   Chronic diastolic CHF November 20, 2021 hospitalization for diastolic CHF exacerbation in the setting of high fluid and salt intake Recommend he continue Lasix  20 daily, creatinine stable 1.5, no edema Weight stable   Diabetes type 2 A1c running high, reports he often will miss the morning insulin  Infection of toe, followed by podiatry, complications from diabetes, on antibiotics   Reproductive/Obstetrics                              Anesthesia Physical Anesthesia  Plan  ASA: 3  Anesthesia Plan: General   Post-op Pain Management:    Induction: Intravenous  PONV Risk Score and Plan: 2 and Ondansetron , Dexamethasone  and Treatment may vary due to age or medical condition  Airway Management Planned: Oral ETT  Additional Equipment:   Intra-op Plan:   Post-operative Plan: Extubation in OR  Informed Consent: I have reviewed the patients History and Physical, chart, labs and discussed the procedure including the risks, benefits and alternatives for the proposed anesthesia with the patient or authorized representative who has indicated his/her understanding and acceptance.     Dental advisory given  Plan Discussed with: CRNA  Anesthesia Plan Comments: (Patient consented for risks of anesthesia including but not limited to:  - adverse reactions to medications - damage to eyes, teeth, lips or other oral mucosa - nerve damage due to positioning  - sore throat or hoarseness - damage to heart, brain, nerves, lungs, other parts of body or loss of life  Informed patient about role of CRNA in peri- and intra-operative care.  Patient voiced understanding.)         Anesthesia Quick Evaluation

## 2024-03-31 NOTE — Op Note (Signed)
 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  He had exhausted his conservative management.  Continued to have worsening tolerance for ambulation.  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  Procedure: L4/5 laminectomy, medial facetectomy and lateral recess decompression    Preoperative Note:   Risks of surgery discussed include: infection, bleeding, stroke, coma, death, paralysis, CSF leak, nerve/spinal cord injury, numbness, tingling, weakness, complex regional pain syndrome, recurrent stenosis and/or disc herniation, vascular injury, development of instability, neck/back pain, need for further surgery, persistent symptoms, development of deformity, and the risks of anesthesia. They understood these risks and have agreed to proceed.  Operative Note:    The patient was then brought from the preoperative center with intravenous access established.  The patient underwent general anesthesia and endotracheal tube intubation, then was rotated on the [facility] table where all pressure points were appropriately padded.  An incision was marked with flouroscopy. The skin was then thoroughly cleansed.  Perioperative antibiotic prophylaxis was administered.  Sterile prep and drapes were then applied and a timeout was then observed.    Once this was complete a 2 cm incision was opened with the use of a #10 blade knife.  The paraspinus muscles were subperiosteally dissected until the facet  was visualized. Flouroscopy was used to confirm the level. A self-retaining retractor was placed.  The microscope was then sterilely brought into the field. Using a high speed drill, the L4/5 laminectomy and medial facet were drilled until ligamentum flavum was visualized. After the laminoforaminotomy was performed, a curette was used to dissect the ligamentum flavum until the epidural fat and dura were visualized. The ligamentum was then carefully removed with 2mm Kerrison punch and rongeurs.  Once this was identified a nerve root retractor was used to mobilize this medially, the mild disc bulge was calcified and did not appear to be compressive.  We then followed along the lateral recesses bilaterally to remove any ongoing compression.  The nerves and spine were well decompressed at the end of the procedure.  Once the thecal sac and nerve root were noted to be relaxed and under less tension the ball-tipped feeler and [facility] were passed along the foramen distally to to ensure no residual compression was noted.  With none noted attention was then turned to closure.   Hemostasis was confirmed after removal of the gelfoam. The retractor was then removed and hemostasis achieved in the superficial tissues. The wound was copiously irrigated.  The fascial layer was reapproximated with the use of a 0- Vicryl suture.  Subcutaneous tissue layer was reapproximated using 2-0 Vicryl suture.  The skin was then cleansed and closed with staples.  Patient was then rotated back to the preoperative bed awakened from anesthesia and taken to recovery all counts are correct in this case.  I performed the entire procedure with the assistance of Lyle Decamp, they helped with exposure, retraction, protection of the neural elements, and closure.   Penne MICAEL Sharps, MD

## 2024-03-31 NOTE — Transfer of Care (Signed)
 Immediate Anesthesia Transfer of Care Note  Patient: Kristopher Oneal  Procedure(s) Performed: Lumbar laminectomy, medial facetectomy and lateral recess decompression, L4-5, midline approach  Patient Location: PACU  Anesthesia Type:General  Level of Consciousness: drowsy and patient cooperative  Airway & Oxygen Therapy: Patient Spontanous Breathing and Patient connected to face mask oxygen  Post-op Assessment: Report given to RN, Post -op Vital signs reviewed and stable, and Patient moving all extremities X 4  Post vital signs: Reviewed and stable  Last Vitals:  Vitals Value Taken Time  BP 123/70 03/31/24 12:21  Temp    Pulse 88 03/31/24 12:25  Resp 20 03/31/24 12:25  SpO2 97 % 03/31/24 12:25  Vitals shown include unfiled device data.  Last Pain:  Vitals:   03/31/24 0754  TempSrc: Temporal  PainSc: 0-No pain         Complications: No notable events documented.

## 2024-03-31 NOTE — Anesthesia Postprocedure Evaluation (Signed)
 Anesthesia Post Note  Patient: Demarko Zeimet  Procedure(s) Performed: Lumbar laminectomy, medial facetectomy and lateral recess decompression, L4-5, midline approach  Patient location during evaluation: PACU Anesthesia Type: General Level of consciousness: awake and alert Pain management: pain level controlled Vital Signs Assessment: post-procedure vital signs reviewed and stable Respiratory status: spontaneous breathing, nonlabored ventilation, respiratory function stable and patient connected to nasal cannula oxygen Cardiovascular status: blood pressure returned to baseline and stable Postop Assessment: no apparent nausea or vomiting Anesthetic complications: no   No notable events documented.   Last Vitals:  Vitals:   03/31/24 1353 03/31/24 1506  BP: 138/77 129/69  Pulse: 83 88  Resp: 18 16  Temp: 36.5 C 36.7 C  SpO2: 96% 96%    Last Pain:  Vitals:   03/31/24 1506  TempSrc:   PainSc: 0-No pain                 Prentice Murphy

## 2024-03-31 NOTE — Discharge Instructions (Signed)
 Your surgeon has performed an operation on your lumbar spine (low back) to relieve pressure on one or more nerves. Many times, patients feel better immediately after surgery and can overdo it. Even if you feel well, it is important that you follow these activity guidelines. If you do not let your back heal properly from the surgery, you can increase the chance of complications and/or return of your symptoms. The following are instructions to help in your recovery once you have been discharged from the hospital.   Activity    No bending, lifting, or twisting (BLT). Avoid lifting objects heavier than 10 pounds (gallon milk jug).  Where possible, avoid household activities that involve lifting, bending, pushing, or pulling such as laundry, vacuuming, grocery shopping, and childcare. Try to arrange for help from friends and family for these activities while your back heals.  Increase physical activity slowly as tolerated.  Taking short walks is encouraged, but avoid strenuous exercise. Do not jog, run, bicycle, lift weights, or participate in any other exercises unless specifically allowed by your doctor. Avoid prolonged sitting, including car rides.  Talk to your doctor before resuming sexual activity.  You should not drive until cleared by your doctor.  Until released by your doctor, you should not return to work or school.  You should rest at home and let your body heal.   You may shower three days after your surgery.  After showering, lightly dab your incision dry. Do not take a tub bath or go swimming for 3 weeks, or until approved by your doctor at your follow-up appointment.  If you smoke, we strongly recommend that you quit.  Smoking has been proven to interfere with normal healing in your back and will dramatically reduce the success rate of your surgery. Please contact [facility] (800-QUIT-NOW) and use the resources at www.[facility].com for assistance in stopping smoking.  Surgical  Incision   If you have a dressing on your incision, you may remove it three days after your surgery. Keep your incision area clean and dry.  Your incision was closed with staples. We will remove them at your first post-op appointment.  Diet            You may return to your usual diet. Be sure to stay hydrated.  You have been prescribed narcotic pain medications.  This often will cause constipation along with the anesthesia that you underwent.  Please obtain Colace and senna. This has been sent to your local pharmacy, but at times it is not covered by insurance and you may obtain it over the counter..  You should take a stool softener and laxative for the duration of you taking the narcotic pain medications.  When to Contact Us   Although your surgery and recovery will likely be uneventful, you may have some residual numbness, aches, and pains in your back and/or legs. This is normal and should improve in the next few weeks.  However, should you experience any of the following, contact us  immediately: New numbness or weakness Pain that is progressively getting worse, and is not relieved by your pain medications or rest Bleeding, redness, swelling, pain, or drainage from surgical incision Chills or flu-like symptoms Fever greater than 101.0 F (38.3 C) Problems with bowel or bladder functions Difficulty breathing or shortness of breath Warmth, tenderness, or swelling in your calf  Contact Information How to contact us :  If you have any questions/concerns before or after surgery, you can reach us  at [phone], or you can send  a fpl group. We can be reached by phone or mychart 8am-4pm, Monday-Friday.  *Please note: Calls after 4pm are forwarded to a third party answering service. Mychart messages are not routinely monitored during evenings, weekends, and holidays. Please call our office to contact the answering service for urgent concerns during non-business hours.

## 2024-04-01 ENCOUNTER — Encounter: Payer: Self-pay | Admitting: Neurosurgery

## 2024-04-02 ENCOUNTER — Other Ambulatory Visit: Payer: Self-pay

## 2024-04-02 ENCOUNTER — Observation Stay
Admission: EM | Admit: 2024-04-02 | Discharge: 2024-04-03 | Disposition: A | Discharge status: Home Health | Attending: Internal Medicine | Admitting: Internal Medicine

## 2024-04-02 ENCOUNTER — Emergency Department

## 2024-04-02 DIAGNOSIS — R29898 Other symptoms and signs involving the musculoskeletal system: Secondary | ICD-10-CM | POA: Diagnosis present

## 2024-04-02 DIAGNOSIS — N183 Chronic kidney disease, stage 3 unspecified: Secondary | ICD-10-CM | POA: Diagnosis not present

## 2024-04-02 DIAGNOSIS — I739 Peripheral vascular disease, unspecified: Secondary | ICD-10-CM | POA: Diagnosis not present

## 2024-04-02 DIAGNOSIS — D72829 Elevated white blood cell count, unspecified: Secondary | ICD-10-CM | POA: Diagnosis present

## 2024-04-02 DIAGNOSIS — I4892 Unspecified atrial flutter: Secondary | ICD-10-CM | POA: Diagnosis not present

## 2024-04-02 DIAGNOSIS — Z7902 Long term (current) use of antithrombotics/antiplatelets: Secondary | ICD-10-CM | POA: Diagnosis not present

## 2024-04-02 DIAGNOSIS — Z79899 Other long term (current) drug therapy: Secondary | ICD-10-CM | POA: Diagnosis not present

## 2024-04-02 DIAGNOSIS — I4891 Unspecified atrial fibrillation: Secondary | ICD-10-CM | POA: Diagnosis present

## 2024-04-02 DIAGNOSIS — F0390 Unspecified dementia without behavioral disturbance: Secondary | ICD-10-CM | POA: Diagnosis not present

## 2024-04-02 DIAGNOSIS — M48062 Spinal stenosis, lumbar region with neurogenic claudication: Secondary | ICD-10-CM | POA: Diagnosis present

## 2024-04-02 DIAGNOSIS — I130 Hypertensive heart and chronic kidney disease with heart failure and stage 1 through stage 4 chronic kidney disease, or unspecified chronic kidney disease: Secondary | ICD-10-CM | POA: Diagnosis not present

## 2024-04-02 DIAGNOSIS — E1122 Type 2 diabetes mellitus with diabetic chronic kidney disease: Secondary | ICD-10-CM | POA: Diagnosis not present

## 2024-04-02 DIAGNOSIS — E1169 Type 2 diabetes mellitus with other specified complication: Secondary | ICD-10-CM | POA: Diagnosis present

## 2024-04-02 DIAGNOSIS — E785 Hyperlipidemia, unspecified: Secondary | ICD-10-CM | POA: Diagnosis not present

## 2024-04-02 DIAGNOSIS — Z8673 Personal history of transient ischemic attack (TIA), and cerebral infarction without residual deficits: Secondary | ICD-10-CM | POA: Diagnosis not present

## 2024-04-02 DIAGNOSIS — E1159 Type 2 diabetes mellitus with other circulatory complications: Secondary | ICD-10-CM | POA: Diagnosis present

## 2024-04-02 DIAGNOSIS — E1151 Type 2 diabetes mellitus with diabetic peripheral angiopathy without gangrene: Secondary | ICD-10-CM | POA: Diagnosis not present

## 2024-04-02 DIAGNOSIS — I5032 Chronic diastolic (congestive) heart failure: Secondary | ICD-10-CM | POA: Diagnosis not present

## 2024-04-02 DIAGNOSIS — E119 Type 2 diabetes mellitus without complications: Secondary | ICD-10-CM

## 2024-04-02 LAB — CBC
HCT: 40.2 % (ref 39.0–52.0)
Hemoglobin: 12.9 g/dL — ABNORMAL LOW (ref 13.0–17.0)
MCH: 30.9 pg (ref 26.0–34.0)
MCHC: 32.1 g/dL (ref 30.0–36.0)
MCV: 96.2 fL (ref 80.0–100.0)
Platelets: 227 K/uL (ref 150–400)
RBC: 4.18 MIL/uL — ABNORMAL LOW (ref 4.22–5.81)
RDW: 14.8 % (ref 11.5–15.5)
WBC: 17.8 K/uL — ABNORMAL HIGH (ref 4.0–10.5)
nRBC: 0 % (ref 0.0–0.2)

## 2024-04-02 LAB — COMPREHENSIVE METABOLIC PANEL WITH GFR
ALT: 28 U/L (ref 0–44)
AST: 34 U/L (ref 15–41)
Albumin: 4 g/dL (ref 3.5–5.0)
Alkaline Phosphatase: 59 U/L (ref 38–126)
Anion gap: 13 (ref 5–15)
BUN: 30 mg/dL — ABNORMAL HIGH (ref 8–23)
CO2: 22 mmol/L (ref 22–32)
Calcium: 9.3 mg/dL (ref 8.9–10.3)
Chloride: 103 mmol/L (ref 98–111)
Creatinine, Ser: 1.22 mg/dL (ref 0.61–1.24)
GFR, Estimated: >60 mL/min (ref >60)
Glucose, Bld: 95 mg/dL (ref 70–99)
Potassium: 4.3 mmol/L (ref 3.5–5.1)
Sodium: 138 mmol/L (ref 135–145)
Total Bilirubin: 0.6 mg/dL (ref 0.0–1.2)
Total Protein: 7.1 g/dL (ref 6.5–8.1)

## 2024-04-02 LAB — TROPONIN T, HIGH SENSITIVITY: Troponin T High Sensitivity: 25 ng/L — ABNORMAL HIGH (ref 0–19)

## 2024-04-02 LAB — GLUCOSE, CAPILLARY: Glucose-Capillary: 215 mg/dL — ABNORMAL HIGH (ref 70–99)

## 2024-04-02 LAB — MAGNESIUM: Magnesium: 1.6 mg/dL — ABNORMAL LOW (ref 1.7–2.4)

## 2024-04-02 MED ORDER — HYDROMORPHONE HCL 1 MG/ML IJ SOLN: 0.2500 mg | INTRAMUSCULAR | Status: DC | PRN

## 2024-04-02 MED ORDER — ONDANSETRON HCL 4 MG/2ML IJ SOLN: 4.0000 mg | Freq: Four times a day (QID) | INTRAMUSCULAR | Status: DC | PRN

## 2024-04-02 MED ORDER — METHYLPREDNISOLONE SODIUM SUCC 125 MG IJ SOLR: 125.0000 mg | Freq: Once | INTRAMUSCULAR | Status: DC

## 2024-04-02 MED ORDER — OXYCODONE HCL 5 MG PO TABS: 5.0000 mg | ORAL_TABLET | ORAL | Status: DC | PRN

## 2024-04-02 MED ORDER — PANTOPRAZOLE SODIUM 40 MG PO TBEC
40.0000 mg | DELAYED_RELEASE_TABLET | Freq: Every evening | ORAL | Status: DC
  Administered 2024-04-02: 40 mg via ORAL
  Filled 2024-04-02: qty 1

## 2024-04-02 MED ORDER — GADOBUTROL 1 MMOL/ML IV SOLN
10.0000 mL | Freq: Once | INTRAVENOUS | Status: AC | PRN
  Administered 2024-04-02: 10 mL via INTRAVENOUS

## 2024-04-02 MED ORDER — ESCITALOPRAM OXALATE 10 MG PO TABS
10.0000 mg | ORAL_TABLET | Freq: Every morning | ORAL | Status: DC
  Administered 2024-04-03: 10 mg via ORAL
  Filled 2024-04-02: qty 1

## 2024-04-02 MED ORDER — DILTIAZEM HCL ER COATED BEADS 300 MG PO CP24
300.0000 mg | ORAL_CAPSULE | Freq: Every evening | ORAL | Status: DC
  Administered 2024-04-02: 300 mg via ORAL
  Filled 2024-04-02 (×3): qty 1

## 2024-04-02 MED ORDER — METHOCARBAMOL 500 MG PO TABS: 500.0000 mg | ORAL_TABLET | Freq: Three times a day (TID) | ORAL | Status: DC | PRN

## 2024-04-02 MED ORDER — PRAVASTATIN SODIUM 20 MG PO TABS
40.0000 mg | ORAL_TABLET | Freq: Every day | ORAL | Status: DC
  Administered 2024-04-02: 40 mg via ORAL
  Filled 2024-04-02: qty 2

## 2024-04-02 MED ORDER — MEMANTINE HCL 5 MG PO TABS
5.0000 mg | ORAL_TABLET | Freq: Two times a day (BID) | ORAL | Status: DC
  Administered 2024-04-02 – 2024-04-03 (×2): 5 mg via ORAL
  Filled 2024-04-02 (×2): qty 1

## 2024-04-02 MED ORDER — METHYLPREDNISOLONE SODIUM SUCC 40 MG IJ SOLR
40.0000 mg | Freq: Once | INTRAMUSCULAR | Status: AC
  Administered 2024-04-02: 40 mg via INTRAVENOUS
  Filled 2024-04-02: qty 1

## 2024-04-02 MED ORDER — INSULIN ASPART 100 UNIT/ML IJ SOLN
0.0000 [IU] | Freq: Three times a day (TID) | INTRAMUSCULAR | Status: DC
  Administered 2024-04-03: 5 [IU] via SUBCUTANEOUS
  Administered 2024-04-03: 3 [IU] via SUBCUTANEOUS
  Filled 2024-04-02: qty 3
  Filled 2024-04-02: qty 5

## 2024-04-02 MED ORDER — ONDANSETRON HCL 4 MG PO TABS: 4.0000 mg | ORAL_TABLET | Freq: Four times a day (QID) | ORAL | Status: DC | PRN

## 2024-04-02 MED ORDER — INSULIN ASPART 100 UNIT/ML IJ SOLN
0.0000 [IU] | Freq: Every day | INTRAMUSCULAR | Status: DC
  Administered 2024-04-02: 2 [IU] via SUBCUTANEOUS
  Filled 2024-04-02: qty 2

## 2024-04-02 MED ORDER — SODIUM CHLORIDE 0.9 % IV BOLUS
500.0000 mL | Freq: Once | INTRAVENOUS | Status: AC
  Administered 2024-04-02: 500 mL via INTRAVENOUS

## 2024-04-02 MED ORDER — HYDROXYCHLOROQUINE SULFATE 200 MG PO TABS
200.0000 mg | ORAL_TABLET | Freq: Two times a day (BID) | ORAL | Status: DC
  Filled 2024-04-02: qty 1

## 2024-04-02 MED ORDER — HEPARIN SODIUM (PORCINE) 5000 UNIT/ML IJ SOLN
5000.0000 [IU] | Freq: Three times a day (TID) | INTRAMUSCULAR | Status: DC
  Administered 2024-04-02 – 2024-04-03 (×4): 5000 [IU] via SUBCUTANEOUS
  Filled 2024-04-02 (×4): qty 1

## 2024-04-02 MED ORDER — ACETAMINOPHEN 325 MG PO TABS: 650.0000 mg | ORAL_TABLET | Freq: Four times a day (QID) | ORAL | Status: DC | PRN

## 2024-04-02 MED ORDER — SODIUM CHLORIDE 0.9% FLUSH
3.0000 mL | Freq: Two times a day (BID) | INTRAVENOUS | Status: DC
  Administered 2024-04-02 – 2024-04-03 (×2): 3 mL via INTRAVENOUS

## 2024-04-02 MED ORDER — METOPROLOL SUCCINATE ER 50 MG PO TB24: 100.0000 mg | ORAL_TABLET | Freq: Every day | ORAL | Status: DC

## 2024-04-02 MED ORDER — LAMOTRIGINE 100 MG PO TABS
50.0000 mg | ORAL_TABLET | Freq: Two times a day (BID) | ORAL | Status: DC
  Administered 2024-04-02 – 2024-04-03 (×2): 50 mg via ORAL
  Filled 2024-04-02 (×3): qty 1

## 2024-04-02 MED ORDER — ACETAMINOPHEN 650 MG RE SUPP: 650.0000 mg | Freq: Four times a day (QID) | RECTAL | Status: DC | PRN

## 2024-04-02 MED ORDER — LACTATED RINGERS IV SOLN: INTRAVENOUS | Status: AC

## 2024-04-02 NOTE — H&P (Signed)
 History and Physical    Kristopher Oneal FMW:[id number] DOB: [birth year] DOA: 04/02/2024  DOS: the patient was seen and examined on 04/02/2024  PCP: Cleotilde Oneil FALCON, MD   Patient coming from: Home  I have personally briefly reviewed patient's old medical records in [facility] and CareEverywhere  HPI:   Kristopher Oneal is a 73 y.o. year old male with medical history of hypertension, hyperlipidemia, type 2 diabetes, lumbar spinal stenosis with claudication status post lumbar laminectomy on 03/31/2024 presenting to the ED with lower extremity weakness.  Pt reports doing well after surgery on Thursday, Pt was walking in his hallway and his legs gave out causing him to slide down. He noticed they have been weak since then. He reports his legs were weak prior to surgery but right after surgery and early Friday he felt well.     On arrival to the ED patient was noted to be HDS stable.  Lab work and imaging obtained.  CBC with leukocytosis at 17.8 mild anemia at 12.9 with baseline around 13.5-14.  CMP with normal electrolytes and slight BUN elevation.  Given patient recent surgery and lower extremity weakness neurosurgery was consulted by EDP and they recommended getting MRI.  MRI with dorsal epidural fluid collection in the laminectomy bed resulting in severe compression of the thecal sac which may be postoperative seroma versus CSF leak versus infection.  Neurosurgery reviewed images and given no incontinence, these could be postoperative changes.  The recommendation was to admit for pain control and mobilization and they will see the patient in consult.  Given this, [facility] contacted for admission.  Review of Systems: As mentioned in the history of present illness. All other systems reviewed and are negative.   Past Medical History:  Diagnosis Date   (HFpEF) heart failure with preserved ejection fraction (HCC)    Adrenal adenoma, left 12/29/2023   a.) Lumbar MRI 12/29/2023: 3.0 x 3.7 cm   Anxiety     Aortic atherosclerosis    Arthritis    Atrial fibrillation (HCC)    a.) CHA2DS2VASc = 8 (age x2, HFpEF, HTN, CVA x 2, vascular disease, T2DM) as of 03/28/2024; b.) rate/rhythm maintained on oral diltiazem  + metropolol succinate; no OAC (self discontinued in 11/2023 due to financial contraints); does take clopidogrel    Cerebral microvascular disease    CKD (chronic kidney disease), stage III (HCC)    CVA (cerebral vascular accident) (HCC)    a.) noted on brain MRI 01/13/2024: cortical infarcts in both the LEFT parietal and RIGHT frontal lobes   GERD (gastroesophageal reflux disease)    Heart murmur    High cholesterol    Hypertension    Inflammatory arthritis    a.) on long term hydroxychloroquine    Long term current use of clopidogrel     Long-term use of hydroxychloroquine     a.) used for inflammatory arthritits Dx   Mood disturbance    PAD (peripheral artery disease)    Spinal stenosis of lumbar region with neurogenic claudication    T2DM (type 2 diabetes mellitus) (HCC)    Vascular dementia (HCC)    a.) on NMDA receptor antagonist (memantine )   Vitamin D deficiency     Past Surgical History:  Procedure Laterality Date   AMPUTATION TOE Left 02/10/2023   Procedure: Left Hallux Amputation;  Surgeon: Neill Boas, DPM;  Location: [facility];  Service: Orthopedics/Podiatry;  Laterality: Left;   BACK SURGERY     l5/s1   COLONOSCOPY     DECOMPRESSIVE LUMBAR LAMINECTOMY LEVEL  1 N/A 03/31/2024   Procedure: Lumbar laminectomy, medial facetectomy and lateral recess decompression, L4-5, midline approach;  Surgeon: Claudene Penne ORN, MD;  Location: [facility];  Service: Neurosurgery;  Laterality: N/A;   KNEE ARTHROPLASTY Right 01/02/2020   Procedure: COMPUTER ASSISTED TOTAL KNEE ARTHROPLASTY;  Surgeon: Mardee Lynwood SQUIBB, MD;  Location: [facility];  Service: Orthopedics;  Laterality: Right;   KNEE ARTHROPLASTY Left 11/19/2020   Procedure: COMPUTER ASSISTED TOTAL KNEE ARTHROPLASTY;  Surgeon: Mardee Lynwood SQUIBB, MD;  Location: [facility];  Service: Orthopedics;  Laterality: Left;   LOWER EXTREMITY ANGIOGRAPHY Left 02/10/2023   Procedure: Lower Extremity Angiography;  Surgeon: Jama Cordella MATSU, MD;  Location: [facility];  Service: Cardiovascular;  Laterality: Left;   ORIF PATELLA Left 04/16/2021   Procedure: OPEN REDUCTION INTERNAL (ORIF) FIXATION PATELLA;  Surgeon: Mardee Lynwood SQUIBB, MD;  Location: [facility];  Service: Orthopedics;  Laterality: Left;   ORIF PATELLA Left 05/10/2021   Procedure: OPEN REDUCTION INTERNAL (ORIF) FIXATION PATELLA;  Surgeon: Mardee Lynwood SQUIBB, MD;  Location: [facility];  Service: Orthopedics;  Laterality: Left;     Allergies[1]  Family History  Problem Relation Age of Onset   Dementia Mother    Heart attack Father    Emphysema Father    Heart attack Brother     Prior to Admission medications  Medication Sig Start Date End Date Taking? Authorizing Provider  acetaminophen  (TYLENOL ) 500 MG tablet Take 2 tablets (1,000 mg total) by mouth every 6 (six) hours as needed. 03/31/24  Yes Ulis Bottcher, PA-C  diltiazem  (CARTIA  XT) 300 MG 24 hr capsule Take 300 mg by mouth every evening.   Yes [provider]  docusate sodium  (COLACE) 100 MG capsule Take 1 capsule (100 mg total) by mouth 2 (two) times daily. 03/31/24  Yes Ulis Bottcher, PA-C  escitalopram  (LEXAPRO ) 10 MG tablet Take 10 mg by mouth in the morning.   Yes [provider]  glipiZIDE  (GLUCOTROL ) 5 MG tablet Take 5 mg by mouth in the morning and at bedtime.   Yes [provider]  HUMULIN  70/30 KWIKPEN (70-30) 100 UNIT/ML KwikPen Inject 15-60 Units into the skin daily with breakfast. Pt inject 60 units in the morning and 15 with supper 03/24/24  Yes [provider]  hydroxychloroquine  (PLAQUENIL ) 200 MG tablet Take 200 mg by mouth 2 (two) times daily.   Yes [provider]  insulin  NPH-regular Human (HUMULIN  70/30) (70-30) 100 UNIT/ML injection Inject 15-60  Units into the skin See admin instructions. Inject 60 units into the skin in the morning and 15 units in the evening   Yes [provider]  lamoTRIgine  (LAMICTAL ) 25 MG tablet Take 50 mg by mouth in the morning and at bedtime.   Yes [provider]  lovastatin  (MEVACOR ) 40 MG tablet Take 1 tablet (40 mg total) by mouth daily with supper. 06/24/22  Yes Gollan, Timothy J, MD  memantine  (NAMENDA ) 5 MG tablet Take 5 mg by mouth 2 (two) times daily. 12/22/23 12/21/24 Yes [provider]  metFORMIN  (GLUCOPHAGE ) 1000 MG tablet Take 1,000 mg by mouth 2 (two) times daily with a meal.  05/27/18  Yes [provider]  metoprolol  succinate (TOPROL -XL) 100 MG 24 hr tablet Take 1 tablet (100 mg total) by mouth at bedtime. 06/24/22  Yes Gollan, Timothy J, MD  Multiple Vitamin (MULTIVITAMIN WITH MINERALS) TABS tablet Take 1 tablet by mouth in the morning.   Yes [provider]  pantoprazole  (PROTONIX ) 40 MG tablet Take  40 mg by mouth every evening. 02/02/24 02/01/25 Yes [provider]  senna (SENOKOT) 8.6 MG TABS tablet Take 1 tablet (8.6 mg total) by mouth daily. 03/31/24  Yes Ulis Bottcher, PA-C  triamterene -hydrochlorothiazide  (DYAZIDE ) 37.5-25 MG capsule Take 1 capsule by mouth every evening.   Yes [provider]  [Paused] clopidogrel  (PLAVIX ) 75 MG tablet Take 75 mg by mouth in the morning. Wait to take this until: April 14, 2024    [provider]  Continuous Blood Gluc Sensor (FREESTYLE LIBRE 14 DAY SENSOR) MISC SMARTSIG:1 Kit(s) Topical Every 2 Weeks 12/20/21   [provider]  methocarbamol  (ROBAXIN ) 500 MG tablet Take 1 tablet (500 mg total) by mouth every 8 (eight) hours as needed for muscle spasms. Patient not taking: Reported on 04/02/2024 03/31/24   Ulis Bottcher, PA-C  oxyCODONE  (ROXICODONE ) 5 MG immediate release tablet Take 1 tablet (5 mg total) by mouth every 4 (four) hours as needed. 03/31/24   Ulis Bottcher, PA-C   Vitamin D, Ergocalciferol, (DRISDOL) 1.25 MG (50000 UNIT) CAPS capsule Take 50,000 Units by mouth every Thursday.    [provider]    Social History:  reports that he quit smoking about 45 years ago. His smoking use included cigarettes. He has never used smokeless tobacco. He reports current alcohol use. He reports that he does not use drugs. Lives with wife, last smoked 45 years ago and no alcohol use. He is independent in ADLs and iADLs.    Physical Exam: Vitals:   04/02/24 1112 04/02/24 1113  BP:  108/64  Pulse:  67  Resp:  19  Temp:  98.5 F (36.9 C)  TempSrc:  Oral  SpO2:  96%  Weight: 117.9 kg   Height: 6' 2 (1.88 m)     Gen: NAD HENT: NCAT CV: normal heart sounds Lung: CTAB Abd: No TTP, normal bowel sounds MSK: No asymmetry, 5/5 strength in upper and lower extremities, no numbness present.  Neuro: alert   Labs on Admission: I have personally reviewed following labs and imaging studies  CBC: Recent Labs  Lab 04/02/24 1113  WBC 17.8*  HGB 12.9*  HCT 40.2  MCV 96.2  PLT 227   Basic Metabolic Panel: Recent Labs  Lab 04/02/24 1113  NA 138  K 4.3  CL 103  CO2 22  GLUCOSE 95  BUN 30*  CREATININE 1.22  CALCIUM 9.3   GFR: Estimated Creatinine Clearance: 71.4 mL/min (by C-G formula based on SCr of 1.22 mg/dL). Liver Function Tests: Recent Labs  Lab 04/02/24 1113  AST 34  ALT 28  ALKPHOS 59  BILITOT 0.6  PROT 7.1  ALBUMIN  4.0   No results for input(s): LIPASE, AMYLASE in the last 168 hours. No results for input(s): AMMONIA in the last 168 hours. Coagulation Profile: No results for input(s): INR, PROTIME in the last 168 hours. Cardiac Enzymes: No results for input(s): CKTOTAL, CKMB, CKMBINDEX, TROPONINI, TROPONINIHS in the last 168 hours. BNP (last 3 results) No results for input(s): BNP in the last 8760 hours. HbA1C: No results for input(s): HGBA1C in the last 72 hours. CBG: Recent Labs  Lab  03/31/24 0801 03/31/24 1225  GLUCAP 152* 167*   Lipid Profile: No results for input(s): CHOL, HDL, LDLCALC, TRIG, CHOLHDL, LDLDIRECT in the last 72 hours. Thyroid Function Tests: No results for input(s): TSH, T4TOTAL, FREET4, T3FREE, THYROIDAB in the last 72 hours. Anemia Panel: No results for input(s): VITAMINB12, FOLATE, FERRITIN, TIBC, IRON, RETICCTPCT in the last 72 hours. Urine analysis:  Component Value Date/Time   COLORURINE YELLOW (A) 12/02/2021 0610   APPEARANCEUR CLEAR (A) 12/02/2021 0610   LABSPEC 1.020 12/02/2021 0610   PHURINE 5.0 12/02/2021 0610   GLUCOSEU 150 (A) 12/02/2021 0610   HGBUR NEGATIVE 12/02/2021 0610   BILIRUBINUR NEGATIVE 12/02/2021 0610   KETONESUR NEGATIVE 12/02/2021 0610   PROTEINUR 30 (A) 12/02/2021 0610   NITRITE NEGATIVE 12/02/2021 0610   LEUKOCYTESUR TRACE (A) 12/02/2021 0610    Radiological Exams on Admission: I have personally reviewed images MR Lumbar Spine W Wo Contrast Result Date: 04/02/2024 EXAM: MRI LUMBAR SPINE 04/02/2024 02:25:58 PM TECHNIQUE: Multiplanar multisequence MRI of the lumbar spine was performed with and without the administration of intravenous contrast. COMPARISON: MRI lumbar spine 12/29/2023. CLINICAL HISTORY: Low back pain, prior surgery, new symptoms; 2 days post op, increasing LE weakness. FINDINGS: BONES AND ALIGNMENT: 5 lumbar type vertebrae. Slight lumbar dextroscoliosis. Unchanged trace retrolisthesis of L5 on S1. No fracture, suspicious marrow lesion, or significant marrow edema. SPINAL CORD: The conus terminates at L1 and is normal in signal. SOFT TISSUES: New postoperative changes in the posterior lumbar soft tissues. A fluid collection in the laminectomy bed at L4-L5 measures 2.3 x 1.7 x 1.9 cm (AP x transverse x craniocaudal) without significant rim enhancement. There is expected postoperative enhancement in the overlying, more superficial soft tissues. 4 cm left adrenal mass,  previously characterized as an adenoma on abdominal MRI and with no specific follow up imaging recommended. DISC LEVELS: Disc desiccation throughout the lumbar spine. Severe disc space narrowing at L5-S1. T12-L1: Mild facet hypertrophy and at most minimal disc bulging without stenosis, unchanged. L1-L2: Minimal disc bulging and mild facet hypertrophy without stenosis, unchanged. L2-L3: Disc bulging and moderate facet hypertrophy result in mild bilateral neural foraminal stenosis without spinal stenosis, unchanged. L3-L4: Disc bulging and moderate to severe facet hypertrophy result in mild spinal stenosis and mild left greater than right neural foraminal stenosis, unchanged. L4-L5: Interval laminectomies. The above described dorsal epidural fluid collection, disc bulging, and a left central disc protrusion result in severe compression of the thecal sac (4 mm AP diameter). Mild left greater than right neural foraminal stenosis. L5-S1: Disc bulging, endplate spurring, and mild to moderate facet hypertrophy without significant stenosis, unchanged. IMPRESSION: 1. Interval posterior decompression at L4-L5. A dorsal epidural fluid collection in the laminectomy bed results in severe compression of the thecal sac and could reflect a postoperative seroma or CSF leak with infection not excluded by imaging. 2. Unchanged disc and facet degeneration elsewhere with mild spinal stenosis at L3-L4. Electronically signed by: Dasie Hamburg MD 04/02/2024 02:53 PM EST RP Workstation: [id number]    EKG: My personal interpretation of EKG shows: Atrial fibrillation with heart rate in the 60s.  No ST elevation.    Assessment/Plan Principal Problem:   Lower extremity weakness Active Problems:   Hypertension associated with diabetes (HCC)   Hyperlipidemia associated with type 2 diabetes mellitus (HCC)   Diabetes mellitus without complication (HCC)   Atrial fibrillation and flutter (HCC)   Leukocytosis   PAD (peripheral artery  disease)   Spinal stenosis of lumbar region with neurogenic claudication   Patient has a history of lumbar stenosis underwent laminectomy 2 days ago presenting with lower extremity weakness.  MRI shows concern for seroma with some compression of the thecal sac.  Neurosurgery consulted and believe these are postsurgical changes.  They recommend pain control with PT/OT.  Also recommended steroids as part of the pain control strategy.  Patient will be admitted to telemetry  unit with frequent neurochecks.  Appreciate neurosurgery recommendations.  Will await with recommendation.  Currently will ensure patient has good pain control and we discussed with him to work with physical therapy and Occupational Therapy.  Leukocytosis: Likely multifactorial in setting of pain, postsurgery state.  Did consider infection but patient without any fever chills or other systemic signs of infection.  Will obtain blood cultures but given other reasons for the leukocytosis we will hold off on initiating antibiotics.  Hypertension: Will continue his diltiazem  and metoprolol  given his atrial fibrillation but will hold Dyazide  as patient is normotensive.  Hyperlipidemia: Continue home statin.  Patient was on Plavix  and that is being held currently.  Type 2 diabetes: Will place patient on moderate SSI.  Atrial fibrillation: Chronic problem for patient.  He is currently rate controlled.  Dementia: Per neurology likely vascular dementia.  Will continue to on memantine .  He has been started on lamotrigine  for mood symptoms.  Will continue that as well.  OA: Patient with history of OA with concern for possible inflammatory arthritis.  Patient was seen by rheumatology and started on Plaquenil .  Will hold here as there is some concern for infection.  VTE prophylaxis:  SQ Heparin   Diet: N.p.o. Code Status:  Full Code Telemetry:  Admission status: Inpatient, Telemetry bed Patient is from: Home Anticipated d/c is to:  Home Anticipated d/c is in: 2-3 days   Family Communication: Updated at bedside  Consults called: Dr. Leverne with neurosurgery  Severity of Illness: The appropriate patient status for this patient is INPATIENT. Inpatient status is judged to be reasonable and necessary in order to provide the required intensity of service to ensure the patient's safety. The patient's presenting symptoms, physical exam findings, and initial radiographic and laboratory data in the context of their chronic comorbidities is felt to place them at high risk for further clinical deterioration. Furthermore, it is not anticipated that the patient will be medically stable for discharge from the hospital within 2 midnights of admission.   * I certify that at the point of admission it is my clinical judgment that the patient will require inpatient hospital care spanning beyond 2 midnights from the point of admission due to high intensity of service, high risk for further deterioration and high frequency of surveillance required.DEWAINE Morene Bathe, MD [facility] DEL. [facility]      [1]  Allergies Allergen Reactions   Gabapentin  Other (See Comments)    Confusion

## 2024-04-02 NOTE — ED Triage Notes (Signed)
 Pt arrived via EMS from home. Pt sts that she slipped and gradually went to the floor. Pt was here last week for a back surgery. Pt sts that he was unable unable to get off the floor. Pt is A/Ox4, NAD

## 2024-04-02 NOTE — Progress Notes (Signed)
 Post void bladder scan. 23 mL retained in bladder.

## 2024-04-02 NOTE — Plan of Care (Signed)
   Problem: Education: Goal: Knowledge of General Education information will improve Description Including pain rating scale, medication(s)/side effects and non-pharmacologic comfort measures Outcome: Progressing

## 2024-04-02 NOTE — Progress Notes (Signed)
 Neurosurgery brief note  Patient is a 21M PMH sig for diabetes, afib, PAD, 2 days s/p posterior lumbar laminectomy, medial facetectomy, lateral recess decompression L4/5 now presenting with difficulty ambulating with bilateral LE weakness? Right greater than left.  No incontinence, no loss of bowel/bladder function otherwise by report neuro intact.  MRI L spine with  IMPRESSION: 1. Interval posterior decompression at L4-L5. A dorsal epidural fluid collection in the laminectomy bed results in severe compression of the thecal sac and could reflect a postoperative seroma or CSF leak with infection not excluded by imaging. 2. Unchanged disc and facet degeneration elsewhere with mild spinal stenosis at L3-L4.  AP: Patient being admitted for pain conctrol, mobilization and PT.  The MRI, while demosntrating a fluid collection at the site of surgery with narrowing of the thecal sac could be postsurgical seroma, and neuro exam without obvious evidence of cauda per report is reassuring.  Would mobilize as tolerated and will continue to follow closely. Could also consider solumedrol dose pack in the meantime as part of pain control and mobilzation.  Full consult to follow  Kristopher JINNY Felix MD Neurosurgery

## 2024-04-02 NOTE — ED Provider Notes (Addendum)
 [facility] Provider Note    Event Date/Time   First MD Initiated Contact with Patient 04/02/24 1116     (approximate)   History   Weakness   HPI  Kristopher Oneal is a 73 y.o. male with a history of diabetes, atrial fibrillation, PAD, spinal stenosis presents postop day 2 after lumbar laminectomy.  History is primarily from his wife as the patient apparently has a history of dementia and has some difficulty with recall.  She reports he is having bilateral lower extremity weakness possibly right greater than left and difficulty getting out of chairs and ambulating.  No loss of bowel or bladder continence     Physical Exam   Triage Vital Signs: ED Triage Vitals  Encounter Vitals Group     BP 04/02/24 1113 108/64     Girls Systolic BP Percentile --      Girls Diastolic BP Percentile --      Boys Systolic BP Percentile --      Boys Diastolic BP Percentile --      Pulse Rate 04/02/24 1113 67     Resp 04/02/24 1113 19     Temp 04/02/24 1113 98.5 F (36.9 C)     Temp Source 04/02/24 1113 Oral     SpO2 04/02/24 1113 96 %     Weight 04/02/24 1112 117.9 kg (260 lb)     Height 04/02/24 1112 1.88 m (6' 2)     Head Circumference --      Peak Flow --      Pain Score 04/02/24 1112 0     Pain Loc --      Pain Education --      Exclude from Growth Chart --     Most recent vital signs: Vitals:   04/02/24 1113  BP: 108/64  Pulse: 67  Resp: 19  Temp: 98.5 F (36.9 C)  SpO2: 96%     General: Awake, no distress.  CV:  Good peripheral perfusion.  Regular rate and rhythm Resp:  Normal effort.  Clear to auscultation bilaterally Abd:  No distention.  Soft, nontender Other:  Able to lift his legs up against gravity without difficulty off the bed, normal sensation on both sides, no saddle anesthesia.   ED Results / Procedures / Treatments   Labs (all labs ordered are listed, but only abnormal results are displayed) Labs Reviewed  COMPREHENSIVE METABOLIC  PANEL WITH GFR - Abnormal; Notable for the following components:      Result Value   BUN 30 (*)    All other components within normal limits  CBC - Abnormal; Notable for the following components:   WBC 17.8 (*)    RBC 4.18 (*)    Hemoglobin 12.9 (*)    All other components within normal limits  TROPONIN T, HIGH SENSITIVITY - Abnormal; Notable for the following components:   Troponin T High Sensitivity 25 (*)    All other components within normal limits  URINALYSIS, ROUTINE W REFLEX MICROSCOPIC     EKG ED ECG REPORT I, Lamar Price, the attending physician, personally viewed and interpreted this ECG.  Date: 04/02/2024  Rhythm: normal sinus rhythm QRS Axis: normal Intervals: normal ST/T Wave abnormalities: normal Narrative Interpretation: no evidence of acute ischemia    RADIOLOGY     PROCEDURES:  Critical Care performed: yes  CRITICAL CARE Performed by: Lamar Price   Total critical care time: 30 minutes  Critical care time was exclusive of separately billable procedures and  treating other patients.  Critical care was necessary to treat or prevent imminent or life-threatening deterioration.  Critical care was time spent personally by me on the following activities: development of treatment plan with patient and/or surrogate as well as nursing, discussions with consultants, evaluation of patient's response to treatment, examination of patient, obtaining history from patient or surrogate, ordering and performing treatments and interventions, ordering and review of laboratory studies, ordering and review of radiographic studies, pulse oximetry and re-evaluation of patient's condition.   Procedures   MEDICATIONS ORDERED IN ED: Medications  sodium chloride  0.9 % bolus 500 mL (500 mLs Intravenous New Bag/Given 04/02/24 1129)  gadobutrol  (GADAVIST ) 1 MMOL/ML injection 10 mL (10 mLs Intravenous Contrast Given 04/02/24 1426)     IMPRESSION / MDM / ASSESSMENT AND  PLAN / ED COURSE  I reviewed the triage vital signs and the nursing notes. Patient's presentation is most consistent with acute presentation with potential threat to life or bodily function.  Patient presents with increasing weakness in the lower extremities as detailed above making it more difficult for him to ambulate, no clear neurological deficit on exam.  Lab work obtained which demonstrates an elevated Detert blood cell count, unclear of the significance 2 days postop, he is afebrile with a normal heart rate.  IV fluids infusing   ----------------------------------------- 12:42 PM on 04/02/2024 -----------------------------------------  Discussed with Dr. Deatrice of neurosurgery who recommends MRI of the lumbar spine with and without contrast   ----------------------------------------- 3:15 PM on 04/02/2024 ----------------------------------------- MRI demonstrates fluid collection with possible compression on thecal sac, communicated this to Dr. Deatrice, he is reviewing the films  I spoke with Dr. Fernand of the hospitalist team for admission      FINAL CLINICAL IMPRESSION(S) / ED DIAGNOSES   Final diagnoses:  Weakness of both lower extremities     Rx / DC Orders   ED Discharge Orders     None        Note:  This document was prepared using Dragon voice recognition software and may include unintentional dictation errors.   Arlander Charleston, MD 04/02/24 1455    Arlander Charleston, MD 04/02/24 [phone]

## 2024-04-03 DIAGNOSIS — R29818 Other symptoms and signs involving the nervous system: Secondary | ICD-10-CM | POA: Diagnosis not present

## 2024-04-03 LAB — BASIC METABOLIC PANEL WITH GFR
Anion gap: 13 (ref 5–15)
BUN: 28 mg/dL — ABNORMAL HIGH (ref 8–23)
CO2: 23 mmol/L (ref 22–32)
Calcium: 8.9 mg/dL (ref 8.9–10.3)
Chloride: 99 mmol/L (ref 98–111)
Creatinine, Ser: 1.01 mg/dL (ref 0.61–1.24)
GFR, Estimated: >60 mL/min (ref >60)
Glucose, Bld: 233 mg/dL — ABNORMAL HIGH (ref 70–99)
Potassium: 4.5 mmol/L (ref 3.5–5.1)
Sodium: 135 mmol/L (ref 135–145)

## 2024-04-03 LAB — CBC
HCT: 39 % (ref 39.0–52.0)
Hemoglobin: 12.4 g/dL — ABNORMAL LOW (ref 13.0–17.0)
MCH: 30.7 pg (ref 26.0–34.0)
MCHC: 31.8 g/dL (ref 30.0–36.0)
MCV: 96.5 fL (ref 80.0–100.0)
Platelets: 211 K/uL (ref 150–400)
RBC: 4.04 MIL/uL — ABNORMAL LOW (ref 4.22–5.81)
RDW: 14.7 % (ref 11.5–15.5)
WBC: 11.6 K/uL — ABNORMAL HIGH (ref 4.0–10.5)
nRBC: 0 % (ref 0.0–0.2)

## 2024-04-03 LAB — GLUCOSE, CAPILLARY
Glucose-Capillary: 198 mg/dL — ABNORMAL HIGH (ref 70–99)
Glucose-Capillary: 208 mg/dL — ABNORMAL HIGH (ref 70–99)

## 2024-04-03 MED ORDER — METOPROLOL SUCCINATE ER 25 MG PO TB24: 25.0000 mg | ORAL_TABLET | Freq: Every day | ORAL | Status: DC

## 2024-04-03 MED ORDER — SENNA 8.6 MG PO TABS
1.0000 | ORAL_TABLET | Freq: Every day | ORAL | Status: DC
  Administered 2024-04-03: 8.6 mg via ORAL
  Filled 2024-04-03: qty 1

## 2024-04-03 MED ORDER — DILTIAZEM HCL ER COATED BEADS 240 MG PO CP24
240.0000 mg | ORAL_CAPSULE | Freq: Every evening | ORAL | Status: DC
  Filled 2024-04-03: qty 1

## 2024-04-03 MED ORDER — PREDNISONE 10 MG PO TABS: ORAL_TABLET | ORAL | 0 refills | Status: DC

## 2024-04-03 NOTE — Consult Note (Signed)
 Neurosurgery-New Consultation Evaluation 04/03/2024 Kristopher Oneal [id number]  Identifying Statement: Kristopher Oneal is a 73 y.o. male from [facility] [place] [id number] with  diabetes, afib, PAD, s/p posterior lumbar laminectomy, medial facetectomy, lateral recess decompression L4/5 03/31/2014 now presenting with difficulty ambulating with bilateral LE weakness episode. Patient noted that before surgery was already having episodes where he felt his legs would give out from spontaneous bilateral weakness.  Noted that immediately following surgery felt it was better though still requried substantial effort to stand and shuffle walk.  Had difficulty with his mobile home getting up stairs and on representation again had an episode where he felt his legs gave out from weakness. No bowel or bladder incontinence, no change in distal circumferential numbness in setting of peripheral neuropathy, no new radicular pain or sensory loss, no perineal numbness   Patient admitted for pain control, mobilization, therapy and dispo planning  Physician Requesting Consultation: No ref. 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   a.) Lumbar MRI 12/29/2023: 3.0 x 3.7 cm   Anxiety    Aortic atherosclerosis    Arthritis    Atrial fibrillation (HCC)    a.) CHA2DS2VASc = 8 (age x2, HFpEF, HTN, CVA x 2, vascular disease, T2DM) as of 03/28/2024; b.) rate/rhythm maintained on oral diltiazem  + metropolol succinate; no OAC (self discontinued in 11/2023 due to financial contraints); does take clopidogrel    Cerebral microvascular disease    CKD (chronic kidney disease), stage III (HCC)    CVA (cerebral vascular accident) (HCC)    a.) noted on brain MRI 01/13/2024: cortical infarcts in both the LEFT parietal and RIGHT frontal lobes   GERD (gastroesophageal reflux disease)     Heart murmur    High cholesterol    Hypertension    Inflammatory arthritis    a.) on long term hydroxychloroquine    Long term current use of clopidogrel     Long-term use of hydroxychloroquine     a.) used for inflammatory arthritits Dx   Mood disturbance    PAD (peripheral artery disease)    Spinal stenosis of lumbar region with neurogenic claudication    T2DM (type 2 diabetes mellitus) (HCC)    Vascular dementia (HCC)    a.) on NMDA receptor antagonist (memantine )   Vitamin D deficiency     Social History: Social History   Socioeconomic History   Marital status: Married    Spouse name: Not on file   Number of children: Not on file   Years of education: Not on file   Highest education level: Not on file  Occupational History   Not on file  Tobacco Use   Smoking status: Former    Current packs/day: 0.00    Types: Cigarettes    Quit date: 1980    Years since quitting: 45.9   Smokeless tobacco: Never   Tobacco comments:    1980  Vaping Use   Vaping status: Never Used  Substance and Sexual Activity   Alcohol use: Yes    Comment: rarely   Drug use: Never   Sexual activity: Not Currently  Other Topics Concern   Not on file  Social History Narrative   Not on file   Social Drivers of Health   Tobacco Use: Medium Risk (04/02/2024)   Patient History    Smoking Tobacco Use: Former    Smokeless Tobacco  Use: Never    Passive Exposure: Not on file  Financial Resource Strain: Low Risk  (01/26/2024)   Received from [facility]   Overall Financial Resource Strain (CARDIA)    Difficulty of Paying Living Expenses: Not hard at all  Food Insecurity: No Food Insecurity (04/02/2024)   Epic    Worried About Running Out of Food in the Last Year: Never true    Ran Out of Food in the Last Year: Never true  Transportation Needs: No Transportation Needs (04/02/2024)   Epic    Lack of Transportation (Medical): No    Lack of Transportation (Non-Medical): No   Physical Activity: Not on file  Stress: Not on file  Social Connections: Socially Integrated (04/02/2024)   Social Connection and Isolation Panel    Frequency of Communication with Friends and Family: Three times a week    Frequency of Social Gatherings with Friends and Family: Three times a week    Attends Religious Services: 1 to 4 times per year    Active Member of Clubs or Organizations: Yes    Attends Banker Meetings: 1 to 4 times per year    Marital Status: Married  Catering Manager Violence: Not At Risk (04/02/2024)   Epic    Fear of Current or Ex-Partner: No    Emotionally Abused: No    Physically Abused: No    Sexually Abused: No  Depression (PHQ2-9): Low Risk (12/27/2021)   Depression (PHQ2-9)    PHQ-2 Score: 0  Alcohol Screen: Not on file  Housing: Low Risk (04/02/2024)   Epic    Unable to Pay for Housing in the Last Year: No    Number of Times Moved in the Last Year: 0    Homeless in the Last Year: No  Utilities: Not At Risk (04/02/2024)   Epic    Threatened with loss of utilities: No  Health Literacy: Not on file   Living arrangements (living alone, with partner): home with wife  Family History: Family History  Problem Relation Age of Onset   Dementia Mother    Heart attack Father    Emphysema Father    Heart attack Brother     Review of Systems:  Review of Systems - General ROS: Negative Psychological ROS: Negative Ophthalmic ROS: Negative ENT ROS: Negative Hematological and Lymphatic ROS: Negative  Endocrine ROS: Negative Respiratory ROS: Negative Cardiovascular ROS: Negative Gastrointestinal ROS: Negative Genito-Urinary ROS: Negative Musculoskeletal ROS: Negative Neurological ROS: Negative Dermatological ROS: Negative  Physical Exam: BP 132/81 (BP Location: Left Arm)   Pulse 79   Temp 99 F (37.2 C) (Oral)   Resp 18   Ht 6' 2 (1.88 m)   Wt 123.2 kg   SpO2 93%   BMI 34.87 kg/m  Body mass index is 34.87 kg/m. Body  surface area is 2.54 meters squared. General appearance: Alert, cooperative, in no acute distress Head: Normocephalic, atraumatic Eyes: Normal, EOM intact Oropharynx: Moist without lesions Neck: Supple, no tenderness Heart: Normal, regular rate and rhythm, without murmur Lungs: Clear to auscultation, good air exchange Abdomen: Soft, nondistended Ext: No edema in LE bilaterally  Neurologic exam:  Mental status: alertness: alert, orientation: person, place, time, affect: normal Speech: fluent and clear Cranial nerves:  II: Visual fields are full by confrontation, no ptosis III/IV/VI: extra-ocular motions intact bilaterally V/VII:no evidence of facial droop or weakness and facial sensation intact VIII: hearing normal XI: trapezius strength symmetric,  sternocleidomastoid strength symmetric XII: tongue strength symmetric  Motor:strength symmetric  5/5, normal muscle mass and tone in all extremities and no pronator drift Sensory: intact to light touch in all extremities Reflexes: 2+ and symmetric bilaterally for arms and legs Coordination: intact finger to nose Gait: normal   Laboratory: Results for orders placed or performed during the hospital encounter of 04/02/24  Comprehensive metabolic panel   Collection Time: 04/02/24 11:13 AM  Result Value Ref Range   Sodium 138 135 - 145 mmol/L   Potassium 4.3 3.5 - 5.1 mmol/L   Chloride 103 98 - 111 mmol/L   CO2 22 22 - 32 mmol/L   Glucose, Bld 95 70 - 99 mg/dL   BUN 30 (H) 8 - 23 mg/dL   Creatinine, Ser 8.77 0.61 - 1.24 mg/dL   Calcium 9.3 8.9 - 89.6 mg/dL   Total Protein 7.1 6.5 - 8.1 g/dL   Albumin  4.0 3.5 - 5.0 g/dL   AST 34 15 - 41 U/L   ALT 28 0 - 44 U/L   Alkaline Phosphatase 59 38 - 126 U/L   Total Bilirubin 0.6 0.0 - 1.2 mg/dL   GFR, Estimated >39 >39 mL/min   Anion gap 13 5 - 15  CBC   Collection Time: 04/02/24 11:13 AM  Result Value Ref Range   WBC 17.8 (H) 4.0 - 10.5 K/uL   RBC 4.18 (L) 4.22 - 5.81 MIL/uL    Hemoglobin 12.9 (L) 13.0 - 17.0 g/dL   HCT 59.7 60.9 - 47.9 %   MCV 96.2 80.0 - 100.0 fL   MCH 30.9 26.0 - 34.0 pg   MCHC 32.1 30.0 - 36.0 g/dL   RDW 85.1 88.4 - 84.4 %   Platelets 227 150 - 400 K/uL   nRBC 0.0 0.0 - 0.2 %  Magnesium    Collection Time: 04/02/24 11:13 AM  Result Value Ref Range   Magnesium  1.6 (L) 1.7 - 2.4 mg/dL  Troponin T, High Sensitivity   Collection Time: 04/02/24 11:13 AM  Result Value Ref Range   Troponin T High Sensitivity 25 (H) 0 - 19 ng/L  Culture, blood (Routine X 2) w Reflex to ID Panel   Collection Time: 04/02/24  4:55 PM   Specimen: BLOOD  Result Value Ref Range   Specimen Description BLOOD BLOOD LEFT HAND    Special Requests      BOTTLES DRAWN AEROBIC AND ANAEROBIC Blood Culture adequate volume   Culture      NO GROWTH < 12 HOURS Performed at [facility], [address] [place], [place] [address]    Report Status PENDING   Culture, blood (Routine X 2) w Reflex to ID Panel   Collection Time: 04/02/24  5:00 PM   Specimen: BLOOD  Result Value Ref Range   Specimen Description BLOOD RIGHT ANTECUBITAL    Special Requests      BOTTLES DRAWN AEROBIC AND ANAEROBIC Blood Culture adequate volume   Culture      NO GROWTH < 12 HOURS Performed at [facility], [address] [place], [place] [address]    Report Status PENDING   Glucose, capillary   Collection Time: 04/02/24 10:21 PM  Result Value Ref Range   Glucose-Capillary 215 (H) 70 - 99 mg/dL  Basic metabolic panel   Collection Time: 04/03/24  6:18 AM  Result Value Ref Range   Sodium 135 135 - 145 mmol/L   Potassium 4.5 3.5 - 5.1 mmol/L   Chloride 99 98 - 111 mmol/L   CO2 23 22 - 32 mmol/L   Glucose,  Bld 233 (H) 70 - 99 mg/dL   BUN 28 (H) 8 - 23 mg/dL   Creatinine, Ser 8.98 0.61 - 1.24 mg/dL   Calcium 8.9 8.9 - 89.6 mg/dL   GFR, Estimated >39 >39 mL/min   Anion gap 13 5 - 15  CBC   Collection Time: 04/03/24  6:18 AM  Result Value Ref Range   WBC 11.6 (H) 4.0 -  10.5 K/uL   RBC 4.04 (L) 4.22 - 5.81 MIL/uL   Hemoglobin 12.4 (L) 13.0 - 17.0 g/dL   HCT 60.9 60.9 - 47.9 %   MCV 96.5 80.0 - 100.0 fL   MCH 30.7 26.0 - 34.0 pg   MCHC 31.8 30.0 - 36.0 g/dL   RDW 85.2 88.4 - 84.4 %   Platelets 211 150 - 400 K/uL   nRBC 0.0 0.0 - 0.2 %  Glucose, capillary   Collection Time: 04/03/24  9:06 AM  Result Value Ref Range   Glucose-Capillary 198 (H) 70 - 99 mg/dL   I personally reviewed labs  Imaging:  Narrative & Impression  EXAM: MRI LUMBAR SPINE 04/02/2024 02:25:58 PM   TECHNIQUE: Multiplanar multisequence MRI of the lumbar spine was performed with and without the administration of intravenous contrast.   COMPARISON: MRI lumbar spine 12/29/2023.   CLINICAL HISTORY: Low back pain, prior surgery, new symptoms; 2 days post op, increasing LE weakness.   FINDINGS:   BONES AND ALIGNMENT: 5 lumbar type vertebrae. Slight lumbar dextroscoliosis. Unchanged trace retrolisthesis of L5 on S1. No fracture, suspicious marrow lesion, or significant marrow edema.   SPINAL CORD: The conus terminates at L1 and is normal in signal.   SOFT TISSUES: New postoperative changes in the posterior lumbar soft tissues. A fluid collection in the laminectomy bed at L4-L5 measures 2.3 x 1.7 x 1.9 cm (AP x transverse x craniocaudal) without significant rim enhancement. There is expected postoperative enhancement in the overlying, more superficial soft tissues. 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   DISC LEVELS: Disc desiccation throughout the lumbar spine. Severe disc space narrowing at L5-S1.   T12-L1: Mild facet hypertrophy and at most minimal disc bulging without stenosis, unchanged.   L1-L2: Minimal disc bulging and mild facet hypertrophy without stenosis, unchanged.   L2-L3: Disc bulging and moderate facet hypertrophy result in mild bilateral  neural foraminal stenosis without spinal stenosis, unchanged.   L3-L4: Disc bulging and moderate to severe facet hypertrophy result in mild spinal stenosis and mild left greater than right neural foraminal stenosis, unchanged.   L4-L5: Interval laminectomies. The above described dorsal epidural fluid collection, disc bulging, and a left central disc protrusion result in severe compression of the thecal sac (4 mm AP diameter). Mild left greater than right neural foraminal stenosis.   L5-S1: Disc bulging, endplate spurring, and mild to moderate facet hypertrophy without significant stenosis, unchanged.   IMPRESSION: 1. Interval posterior decompression at L4-L5. A dorsal epidural fluid collection in the laminectomy bed results in severe compression of the thecal sac and could reflect a postoperative seroma or CSF leak with infection not excluded by imaging. 2. Unchanged disc and facet degeneration elsewhere with mild spinal stenosis at L3-L4.   Electronically signed by: Dasie Hamburg MD 04/02/2024 02:53 PM EST RP Workstation: [id number]   I personally reviewed radiology studies to include:   Impression/Plan:  73RHM PMH sig diabetes, afib, PAD, s/p posterior lumbar laminectomy, medial facetectomy, lateral recess decompression L4/5 (Dr.  Smith) 03/31/2014 now presenting with difficulty ambulating with bilateral LE weakness episode. His neurologic exam is very reassuring, grossly full strength and sensation save known distal neuropathy, no evidence of cauda equina. The MRI, while demosntrating a fluid collection at the site of surgery with narrowing of the thecal sac could be postsurgical seroma but postop MRI L spine in this time interval often will have such collections.  His neurologic exam is very reassuring    1.  Diagnosis s/p lumbar surgery  2.  Plan Mobilize as tolerated PT for support and dispo planning, I suspect he needs additional supports at home versus even inpatient  rehab but defer to PT for guidance there. OK from neurosurgcial standpoint to dispo once properly set up for ongoing therapy needs per primary team Activity as tolerated, and can continue solumedrol dose pack to completion.  Belvie PARAS. Deatrice, MD Neurosurgery

## 2024-04-03 NOTE — Progress Notes (Signed)
 Pt discharged to home. AVS discharge instructions and packet provided to pt and pt's wife, at bedside. All questions and concerns answered. Teach-back technique utilized. All PIVs removed, sites WDL. Telemetry disconnected and notified. All belongings taken with pt--pt verified.  Pt transported home by wife via personal vehicle.

## 2024-04-03 NOTE — TOC Progression Note (Addendum)
 Transition of Care [facility]) - Progression Note    Patient Details  Name: Kristopher Oneal MRN: [id number] Date of Birth: 02/21/1951  Transition of Care [facility]) CM/SW Contact  Lorraine LILLETTE Fenton, LCSW Phone Number: 04/03/2024, 1:11 PM  Clinical Narrative:     CSW added to pt communication/planning.  Pt can benefit form HHPT, also pt needs Code 44 delivered as he is not Inpatient criteria.  CSW will discuss both with pt and also sought HHPT offers via Hub.    ICM following.    Addendum-  Pt declines HHPT and prefers outpatient. CSW will update Hub request- C44 delivered.  No further ICM needs.    Barriers to Discharge: Continued Medical Work up               Expected Discharge Plan and Services         Expected Discharge Date: 04/03/24                                     Social Drivers of Health (SDOH) Interventions SDOH Screenings   Food Insecurity: No Food Insecurity (04/02/2024)  Housing: Low Risk (04/02/2024)  Transportation Needs: No Transportation Needs (04/02/2024)  Utilities: Not At Risk (04/02/2024)  Depression (PHQ2-9): Low Risk (12/27/2021)  Financial Resource Strain: Low Risk  (01/26/2024)   Received from [facility]  Social Connections: Socially Integrated (04/02/2024)  Tobacco Use: Medium Risk (04/02/2024)    Readmission Risk Interventions     No data to display

## 2024-04-03 NOTE — Progress Notes (Incomplete)
 PROGRESS NOTE    Kristopher Oneal   FMW:[id number] DOB: [birth year]  DOA: 04/02/2024 Date of Service: 04/03/2024 which is hospital day 1  PCP: Cleotilde Oneil FALCON, MD    Hospital course / significant events:   Kristopher Oneal is a 73 y.o. year old male with medical history of hypertension, hyperlipidemia, type 2 diabetes, lumbar spinal stenosis with claudication status post lumbar laminectomy on 03/31/2024 presenting to the ED with lower extremity weakness.   HPI:  Pt was here few days ago for laminectomy 12/11.  He reports his legs were weak prior to surgery but right after surgery and early Friday he felt well. Pt was walking in his hallway and his legs gave out causing him to slide down. Pt sts that he gradually went to the floor, was unable unable to get off the floor.   12/13: to ED. CBC with leukocytosis at 17.8 mild anemia at 12.9 with baseline around 13.5-14. CMP with normal electrolytes and slight BUN elevation. Given patient recent surgery and lower extremity weakness neurosurgery was consulted by EDP and they recommended getting MRI. MRI with dorsal epidural fluid collection in the laminectomy bed resulting in severe compression of the thecal sac which may be postoperative seroma versus CSF leak versus infection. Neurosurgery reviewed images and given no incontinence, these could be postoperative changes. The recommendation was to admit for pain control and mobilization and they will see the patient in consult. PT admitted to hospitalist service.  12/14:      Consultants:  Neurosurgery   Procedures/Surgeries: This admission: None at this time  {NOTE: PRIOR TO THIS ADMISSION BUT RECENT 03/31/24: L4/5 laminectomy, medial facetectomy and lateral recess decompression w/ Dr Claudene neurosurgery}    ASSESSMENT & PLAN:   Neurogenic claudication d/t severe stenosis :4-L5, s/p L4-L5 laminectomy 03/31/24. Now w/ weakness and ambulatory dysfunction, fall  MRI shows concern for seroma with some  compression of the thecal sac, likely postsurgical changes Neurosurgery to follow Pain control PT/OT  Steroids    Leukocytosis  WBC 17 on admission 12/13 --> 11.6 today 12/14 Likely multifactorial in setting of pain, postsurgery state.   No fever chills or other systemic signs of infection.   obtain blood cultures but given other reasons for the leukocytosis but will hold off on initiating antibiotics.   Essential Hypertension continue his diltiazem  and metoprolol  given his atrial fibrillation but reduced dose d/t lower BP today hold Dyazide  as patient is normotensive to hypotensive.   Hyperlipidemia Continue home statin.   Patient was on Plavix  and that is being held currently.   Type 2 diabetes:  A1c past 3 mos: 7.6 = at goal on home regimen moderate SSI while on steroids    Atrial fibrillation:  Chronic  currently rate controlled. Metoprolol  and diltiazem  reduced as above d/t low BP *** anticoag    Dementia Per neurology likely vascular dementia.   continue memantine .   Continue lamotrigine  for mood symptoms   OA concern for possible inflammatory arthritis.   was seen by rheumatology  Home Plaquenil  - Will hold here as there is some concern for infection.    Overweight / Class 1 obesity based on BMI: Body mass index is 34.9 kg/m.SABRA Significantly low or high BMI is associated with higher medical risk.  Underweight - under 18  overweight - 25 to 29 obese - 30 or more Class 1 obesity: BMI of 30.0 to 34 Class 2 obesity: BMI of 35.0 to 39 Class 3 obesity: BMI of 40.0 to 49 Super Morbid  Obesity: BMI 50-59 Super-super Morbid Obesity: BMI 60+ Healthy nutrition and physical activity advised as adjunct to other disease management and risk reduction treatments    DVT prophylaxis: *** IV fluids: *** continuous IV fluids  Nutrition: *** Central lines / other devices: ***  Code Status: *** ACP documentation reviewed: *** none on file in [facility]  TOC needs:  *** Medical barriers to dispo: ***. Expected medical readiness for discharge ***.              Subjective / Brief ROS:  Patient reports *** Denies CP/SOB.  Pain controlled.  Denies new weakness.  Tolerating diet ***.  Reports no concerns w/ urination/defecation.   Family Communication: ***    Objective Findings:  Vitals:   04/02/24 2042 04/02/24 2121 04/03/24 0010 04/03/24 0444  BP: 114/60 105/61 (!) 108/57 (!) 100/49  Pulse: 75 81 72 77  Resp:  18 18 18   Temp: 99.4 F (37.4 C) 97.8 F (36.6 C) (!) 97.5 F (36.4 C) (!) 97.5 F (36.4 C)  TempSrc: Oral     SpO2:  91% 91% 93%  Weight:      Height:        Intake/Output Summary (Last 24 hours) at 04/03/2024 0753 Last data filed at 04/03/2024 0329 Gross per 24 hour  Intake 1506.21 ml  Output --  Net 1506.21 ml   Filed Weights   04/02/24 1112 04/02/24 1623  Weight: 117.9 kg 123.3 kg    Examination:  Physical Exam       Scheduled Medications:   diltiazem   240 mg Oral QPM   escitalopram   10 mg Oral q AM   heparin   5,000 Units Subcutaneous Q8H   insulin  aspart  0-15 Units Subcutaneous TID WC   insulin  aspart  0-5 Units Subcutaneous QHS   lamoTRIgine   50 mg Oral BID   memantine   5 mg Oral BID   metoprolol  succinate  25 mg Oral QHS   pantoprazole   40 mg Oral QPM   pravastatin   40 mg Oral q1800   senna  1 tablet Oral Daily   sodium chloride  flush  3 mL Intravenous Q12H    Continuous Infusions:  lactated ringers  75 mL/hr at 04/03/24 0631    PRN Medications:  acetaminophen  **OR** acetaminophen , HYDROmorphone  (DILAUDID ) injection, methocarbamol , ondansetron  **OR** ondansetron  (ZOFRAN ) IV, oxyCODONE   Antimicrobials from admission:  Anti-infectives (From admission, onward)    Start     Dose/Rate Route Frequency Ordered Stop   04/02/24 2200  hydroxychloroquine  (PLAQUENIL ) tablet 200 mg  Status:  Discontinued        200 mg Oral 2 times daily 04/02/24 1608 04/02/24 1618           Data  Reviewed:  I have personally reviewed the following...  CBC: Recent Labs  Lab 04/02/24 1113 04/03/24 0618  WBC 17.8* 11.6*  HGB 12.9* 12.4*  HCT 40.2 39.0  MCV 96.2 96.5  PLT 227 211   Basic Metabolic Panel: Recent Labs  Lab 04/02/24 1113 04/03/24 0618  NA 138 135  K 4.3 4.5  CL 103 99  CO2 22 23  GLUCOSE 95 233*  BUN 30* 28*  CREATININE 1.22 1.01  CALCIUM 9.3 8.9  MG 1.6*  --    GFR: Estimated Creatinine Clearance: 88.1 mL/min (by C-G formula based on SCr of 1.01 mg/dL). Liver Function Tests: Recent Labs  Lab 04/02/24 1113  AST 34  ALT 28  ALKPHOS 59  BILITOT 0.6  PROT 7.1  ALBUMIN  4.0   No results  for input(s): LIPASE, AMYLASE in the last 168 hours. No results for input(s): AMMONIA in the last 168 hours. Coagulation Profile: No results for input(s): INR, PROTIME in the last 168 hours. Cardiac Enzymes: No results for input(s): CKTOTAL, CKMB, CKMBINDEX, TROPONINI in the last 168 hours. BNP (last 3 results) No results for input(s): PROBNP in the last 8760 hours. HbA1C: No results for input(s): HGBA1C in the last 72 hours. CBG: Recent Labs  Lab 03/31/24 0801 03/31/24 1225 04/02/24 2221  GLUCAP 152* 167* 215*   Lipid Profile: No results for input(s): CHOL, HDL, LDLCALC, TRIG, CHOLHDL, LDLDIRECT in the last 72 hours. Thyroid Function Tests: No results for input(s): TSH, T4TOTAL, FREET4, T3FREE, THYROIDAB in the last 72 hours. Anemia Panel: No results for input(s): VITAMINB12, FOLATE, FERRITIN, TIBC, IRON, RETICCTPCT in the last 72 hours. Most Recent Urinalysis On File:     Component Value Date/Time   COLORURINE YELLOW (A) 12/02/2021 0610   APPEARANCEUR CLEAR (A) 12/02/2021 0610   LABSPEC 1.020 12/02/2021 0610   PHURINE 5.0 12/02/2021 0610   GLUCOSEU 150 (A) 12/02/2021 0610   HGBUR NEGATIVE 12/02/2021 0610   BILIRUBINUR NEGATIVE 12/02/2021 0610   KETONESUR NEGATIVE 12/02/2021 0610    PROTEINUR 30 (A) 12/02/2021 0610   NITRITE NEGATIVE 12/02/2021 0610   LEUKOCYTESUR TRACE (A) 12/02/2021 0610   Sepsis Labs: @LABRCNTIP (procalcitonin:4,lacticidven:4) Microbiology: Recent Results (from the past 240 hours)  Culture, blood (Routine X 2) w Reflex to ID Panel     Status: None (Preliminary result)   Collection Time: 04/02/24  4:55 PM   Specimen: BLOOD  Result Value Ref Range Status   Specimen Description BLOOD BLOOD LEFT HAND  Final   Special Requests   Final    BOTTLES DRAWN AEROBIC AND ANAEROBIC Blood Culture adequate volume   Culture   Final    NO GROWTH < 12 HOURS Performed at [facility], [address] [place], [place] [address]    Report Status PENDING  Incomplete  Culture, blood (Routine X 2) w Reflex to ID Panel     Status: None (Preliminary result)   Collection Time: 04/02/24  5:00 PM   Specimen: BLOOD  Result Value Ref Range Status   Specimen Description BLOOD RIGHT ANTECUBITAL  Final   Special Requests   Final    BOTTLES DRAWN AEROBIC AND ANAEROBIC Blood Culture adequate volume   Culture   Final    NO GROWTH < 12 HOURS Performed at [facility], [address] [place], [place] [address]    Report Status PENDING  Incomplete      Radiology Studies last 3 days: MR Lumbar Spine W Wo Contrast Result Date: 04/02/2024 EXAM: MRI LUMBAR SPINE 04/02/2024 02:25:58 PM TECHNIQUE: Multiplanar multisequence MRI of the lumbar spine was performed with and without the administration of intravenous contrast. COMPARISON: MRI lumbar spine 12/29/2023. CLINICAL HISTORY: Low back pain, prior surgery, new symptoms; 2 days post op, increasing LE weakness. FINDINGS: BONES AND ALIGNMENT: 5 lumbar type vertebrae. Slight lumbar dextroscoliosis. Unchanged trace retrolisthesis of L5 on S1. No fracture, suspicious marrow lesion, or significant marrow edema. SPINAL CORD: The conus terminates at L1 and is normal in signal. SOFT TISSUES: New postoperative changes in  the posterior lumbar soft tissues. A fluid collection in the laminectomy bed at L4-L5 measures 2.3 x 1.7 x 1.9 cm (AP x transverse x craniocaudal) without significant rim enhancement. There is expected postoperative enhancement in the overlying, more superficial soft tissues. 4 cm left adrenal mass, previously characterized as an adenoma  on abdominal MRI and with no specific follow up imaging recommended. DISC LEVELS: Disc desiccation throughout the lumbar spine. Severe disc space narrowing at L5-S1. T12-L1: Mild facet hypertrophy and at most minimal disc bulging without stenosis, unchanged. L1-L2: Minimal disc bulging and mild facet hypertrophy without stenosis, unchanged. L2-L3: Disc bulging and moderate facet hypertrophy result in mild bilateral neural foraminal stenosis without spinal stenosis, unchanged. L3-L4: Disc bulging and moderate to severe facet hypertrophy result in mild spinal stenosis and mild left greater than right neural foraminal stenosis, unchanged. L4-L5: Interval laminectomies. The above described dorsal epidural fluid collection, disc bulging, and a left central disc protrusion result in severe compression of the thecal sac (4 mm AP diameter). Mild left greater than right neural foraminal stenosis. L5-S1: Disc bulging, endplate spurring, and mild to moderate facet hypertrophy without significant stenosis, unchanged. IMPRESSION: 1. Interval posterior decompression at L4-L5. A dorsal epidural fluid collection in the laminectomy bed results in severe compression of the thecal sac and could reflect a postoperative seroma or CSF leak with infection not excluded by imaging. 2. Unchanged disc and facet degeneration elsewhere with mild spinal stenosis at L3-L4. Electronically signed by: Dasie Hamburg MD 04/02/2024 02:53 PM EST RP Workstation: [id number]   DG Lumbar Spine 2-3 Views Result Date: 03/31/2024 CLINICAL DATA:  Elective surgery EXAM: LUMBAR SPINE - 2-3 VIEW COMPARISON:  03/02/2024 FINDINGS:  Two low resolution intraoperative spot views of the lumbar spine. Total fluoroscopy time was 1 second, fluoroscopic dose of 1.074 mGy. Surgical linear localizing instrument posteriorly at L4-L5 level. Surgical instruments along the posterior aspects of the L4 and L5 vertebral bodies. IMPRESSION: Intraoperative fluoroscopic assistance provided during lumbar spine surgery. Electronically Signed   By: Luke Bun M.D.   On: 03/31/2024 17:58   DG C-Arm 1-60 Min-No Report Result Date: 03/31/2024 Fluoroscopy was utilized by the requesting physician.  No radiographic interpretation.   DG C-Arm 1-60 Min-No Report Result Date: 03/31/2024 Fluoroscopy was utilized by the requesting physician.  No radiographic interpretation.       Time spent: ***    Shawneen Deetz, DO [facility] 04/03/2024, 7:53 AM    Dictation software may have been used to generate the above note. Typos may occur and escape review in typed/dictated notes. Please contact Dr Marsa directly for clarity if needed.  Staff may message me via secure chat in Epic  but this may not receive an immediate response,  please page me for urgent matters!  If 7PM-7AM, please contact night coverage [email or web address]

## 2024-04-03 NOTE — Evaluation (Addendum)
 Occupational Therapy Evaluation Patient Details Name: Kristopher Oneal MRN: [id number] DOB: Nov 07, 1950 Today's Date: 04/03/2024   History of Present Illness   Kristopher Oneal is a 73 y/o male admitted secondary to bilateral LE weakness after recent spinal surgery (12/11). Imaging revealed severe compression of the thecal sac, likely post-op seroma or CSF leak. PMH including but not limited to hypertension, hyperlipidemia, type 2 diabetes, lumbar spinal stenosis with claudication status post lumbar laminectomy on 03/31/2024.     Clinical Impressions Kristopher Oneal was seen for OT evaluation this date. Prior to hospital admission, Kristopher Oneal was independent/MOD I for ADL management. Kristopher Oneal lives in an RV with his spouse who is able to provide PRN assistance. Per Kristopher Oneal/spouse Kristopher Oneal sits most of the day, has limited mobility out of the house, but is able to walk up/down his RV steps to get to/from his car, golf cart, etc. Kristopher Oneal presents at or near baseline level of functional independence. He is able to perform toilet transfer independently/MOD I with use of grab bars for STS from commode. Kristopher Oneal/caregiver educated on safety, falls prevention strategies, and compensatory ADL management. Both verbalize understanding of education provided.No further skilled OT needs identified. Do not anticipate the need for follow up OT services upon acute hospital DC.       If plan is discharge home, recommend the following:   A little help with walking and/or transfers;A little help with bathing/dressing/bathroom     Functional Status Assessment   Patient has had a recent decline in their functional status and demonstrates the ability to make significant improvements in function in a reasonable and predictable amount of time.     Equipment Recommendations   None recommended by OT     Recommendations for Other Services         Precautions/Restrictions   Precautions Precautions: Back;Fall Recall of Precautions/Restrictions:  Intact Restrictions Weight Bearing Restrictions Per Provider Order: No     Mobility Bed Mobility               General bed mobility comments: Kristopher Oneal OOB in recliner chair upon Kristopher Oneal arrival    Transfers Overall transfer level: Needs assistance Equipment used: None Transfers: Sit to/from Stand Sit to Stand: Modified independent (Device/Increase time)                  Balance Overall balance assessment: Needs assistance Sitting-balance support: Feet supported Sitting balance-Leahy Scale: Good     Standing balance support: During functional activity, No upper extremity supported Standing balance-Leahy Scale: Fair                             ADL either performed or assessed with clinical judgement   ADL Overall ADL's : At baseline                                       General ADL Comments: Performs functional mobility/toilet transfer in room independently. Spouse at bedside states Kristopher Oneal is near baseline level of functional independence. Kristopher Oneal with limited mobility prior to admission, tends to use golf cart or scooter for longer distances.     Vision Baseline Vision/History: 1 Wears glasses Patient Visual Report: No change from baseline       Perception         Praxis         Pertinent Vitals/Pain Pain Assessment Pain Assessment: No/denies pain  Extremity/Trunk Assessment Upper Extremity Assessment Upper Extremity Assessment: Overall WFL for tasks assessed   Lower Extremity Assessment Lower Extremity Assessment: Generalized weakness   Cervical / Trunk Assessment Cervical / Trunk Assessment: Back Surgery   Communication Communication Communication: No apparent difficulties   Cognition Arousal: Alert Behavior During Therapy: WFL for tasks assessed/performed Cognition: History of cognitive impairments             OT - Cognition Comments: Per spouse, Kristopher Oneal has dementia                 Following commands: Intact        Cueing  General Comments   Cueing Techniques: Verbal cues      Exercises Other Exercises Other Exercises: Educated on role of OT in acutesetting, safety, falls prevention strategies, and DC recs.   Shoulder Instructions      Home Living Family/patient expects to be discharged to:: Private residence Living Arrangements: Spouse/significant other Available Help at Discharge: Family;Available PRN/intermittently Type of Home: Other(Comment) (RV) Home Access: Stairs to enter Entrance Stairs-Number of Steps: 4 to enter with B hand rails. 3 STE bathroom inside RV with single rail. Entrance Stairs-Rails: Right;Left;Can reach both Home Layout:  (3 steps to bathroom inside RV)     Bathroom Shower/Tub: Producer, Television/film/video: (P) Handicapped height Bathroom Accessibility: No   Home Equipment: Agricultural Consultant (2 wheels);Shower seat;Cane - single point Adaptive Equipment: (P) Reacher        Prior Functioning/Environment Prior Level of Function : Independent/Modified Independent             Mobility Comments: amb with SPC ADLs Comments: Independent prior to sx. Generally limited amb to [facility] distances but does get out of the house with his wife to grocery shop, etc. Uses electric shopping cart.    OT Problem List: Decreased activity tolerance;Impaired balance (sitting and/or standing);Decreased strength   OT Treatment/Interventions:        OT Goals(Current goals can be found in the care plan section)   Acute Rehab OT Goals Patient Stated Goal: to go home OT Goal Formulation: All assessment and education complete, DC therapy Time For Goal Achievement: 04/03/24 Potential to Achieve Goals: Good   OT Frequency:       Co-evaluation              AM-PAC OT 6 Clicks Daily Activity     Outcome Measure Help from another person eating meals?: None Help from another person taking care of personal grooming?: None Help from another person toileting, which  includes using toliet, bedpan, or urinal?: None Help from another person bathing (including washing, rinsing, drying)?: A Little Help from another person to put on and taking off regular upper body clothing?: None Help from another person to put on and taking off regular lower body clothing?: A Little 6 Click Score: 22   End of Session    Activity Tolerance: Patient tolerated treatment well Patient left: in chair;with family/visitor present;with nursing/sitter in room;with call bell/phone within reach;with chair alarm set  OT Visit Diagnosis: Other abnormalities of gait and mobility (R26.89)                Time: 8751-8693 OT Time Calculation (min): 18 min Charges:  OT General Charges $OT Visit: 1 Visit OT Evaluation $OT Eval Low Complexity: 1 Low  Jhonny Pelton, M.S., OTR/L 04/03/2024, 2:21 PM

## 2024-04-03 NOTE — Care Management Obs Status (Signed)
 MEDICARE OBSERVATION STATUS NOTIFICATION   Patient Details  Name: Kristopher Oneal MRN: [id number] Date of Birth: [birth year]   Medicare Observation Status Notification Given:  Yes    [name], LCSW 04/03/2024, 1:34 PM

## 2024-04-03 NOTE — Progress Notes (Signed)
 Physical Therapy Treatment Patient Details Name: Kristopher Oneal MRN: [id number] DOB: 1950/12/24 Today's Date: 04/03/2024   History of Present Illness Pt is a 73 y/o male admitted secondary to bilateral LE weakness after recent spinal surgery (12/11). Imaging revealed severe compression of the thecal sac, likely post-op seroma or CSF leak. PMH including but not limited to hypertension, hyperlipidemia, type 2 diabetes, lumbar spinal stenosis with claudication status post lumbar laminectomy on 03/31/2024.    PT Comments  Pt seen for second session to assess stair negotiation prior to d/c home. Pt's wife present throughout this session. Pt able to ascend/descend two steps x2 trials with use of hand rails and PT providing CGA for safety. Pt demonstrating instability in bilateral knees with ambulation; however, this improved with VC'ing from PT to maintain proximity to RW. PT discussed recommendations for pt to use RW upon d/c for improved balance and stability. Pt and pt's wife expressed understanding and stated that pt has a RW in their storage unit. Additionally, PT discussed updated recs to HHPT services upon d/c. Pt and spouse both agreeable and feel this would be more appropriate at this time. Pt would continue to benefit from skilled physical therapy services at this time while admitted and after d/c to address the below listed limitations in order to improve overall safety and independence with functional mobility.     If plan is discharge home, recommend the following: Assistance with cooking/housework;Help with stairs or ramp for entrance;A little help with walking and/or transfers   Can travel by private vehicle        Equipment Recommendations  None recommended by PT;Other (comment) (pt and wife reported that pt has a RW in their storage unit)    Recommendations for Other Services       Precautions / Restrictions Precautions Precautions: Back;Fall Restrictions Weight Bearing Restrictions  Per Provider Order: No     Mobility  Bed Mobility               General bed mobility comments: pt OOB in recliner chair upon PT arrival    Transfers Overall transfer level: Needs assistance Equipment used: Rolling walker (2 wheels) Transfers: Sit to/from Stand Sit to Stand: Supervision           General transfer comment: supervision for safety; good technique utilize to rise into standing; VC's needed to keep the RW with him when returning to sit in the chair at end of session    Ambulation/Gait Ambulation/Gait assistance: Supervision, Contact guard assist Gait Distance (Feet): 75 Feet Assistive device: Rolling walker (2 wheels) Gait Pattern/deviations: Step-through pattern, Decreased stride length Gait velocity: decreased     General Gait Details: pt with slow, cautious gait with use of RW, no overt LOB or need for additional external physical assistance. VC'ing to maintain proximity to RW.   Stairs Stairs: Yes Stairs assistance: Contact guard assist Stair Management: Two rails, Step to pattern, Forwards Number of Stairs: 4 (2 x2 trials) General stair comments: CGA for safety, pt able to ascend/descend two steps x2 trials slowly but without difficulties   Wheelchair Mobility     Tilt Bed    Modified Rankin (Stroke Patients Only)       Balance Overall balance assessment: Needs assistance Sitting-balance support: Feet supported Sitting balance-Leahy Scale: Good     Standing balance support: During functional activity, Bilateral upper extremity supported, Single extremity supported, No upper extremity supported Standing balance-Leahy Scale: Poor  Communication Communication Communication: No apparent difficulties  Cognition Arousal: Alert Behavior During Therapy: WFL for tasks assessed/performed   PT - Cognitive impairments: No apparent impairments                         Following commands:  Intact      Cueing Cueing Techniques: Verbal cues  Exercises      General Comments        Pertinent Vitals/Pain Pain Assessment Pain Assessment: No/denies pain    Home Living Family/patient expects to be discharged to:: (P) Private residence Living Arrangements: (P) Spouse/significant other Available Help at Discharge: (P) Family;Available PRN/intermittently Type of Home: (P) Other(Comment) (RV) Home Access: (P) Stairs to enter Entrance Stairs-Rails: (P) Right;Left Entrance Stairs-Number of Steps: (P) 4 to enter with B hand rails. 3 STE bathroom inside RV with single rail.   Home Layout: (P) Other (Comment) Home Equipment: (P) Rolling Walker (2 wheels);Grab bars - tub/shower;Shower seat;Cane - single point;Adaptive equipment      Prior Function            PT Goals (current goals can now be found in the care plan section) Acute Rehab PT Goals PT Goal Formulation: With patient Time For Goal Achievement: 04/17/24 Potential to Achieve Goals: Good Progress towards PT goals: Progressing toward goals    Frequency    Min 2X/week      PT Plan      Co-evaluation              AM-PAC PT 6 Clicks Mobility   Outcome Measure  Help needed turning from your back to your side while in a flat bed without using bedrails?: A Little Help needed moving from lying on your back to sitting on the side of a flat bed without using bedrails?: A Little Help needed moving to and from a bed to a chair (including a wheelchair)?: None Help needed standing up from a chair using your arms (e.g., wheelchair or bedside chair)?: None Help needed to walk in hospital room?: A Little Help needed climbing 3-5 steps with a railing? : A Little 6 Click Score: 20    End of Session   Activity Tolerance: Patient tolerated treatment well Patient left: in chair;with call bell/phone within reach;with family/visitor present Nurse Communication: Mobility status PT Visit Diagnosis: Other  abnormalities of gait and mobility (R26.89)     Time: 1210-1221 PT Time Calculation (min) (ACUTE ONLY): 11 min  Charges:    $Gait Training: 8-22 mins PT General Charges $$ ACUTE PT VISIT: 1 Visit                     Delon DELENA KLEIN, DPT  Acute Rehabilitation Services Office [phone]    Delon HERO Kailiana Granquist 04/03/2024, 1:13 PM

## 2024-04-03 NOTE — Plan of Care (Signed)
°  Problem: Education: Goal: Knowledge of General Education information will improve Description: Including pain rating scale, medication(s)/side effects and non-pharmacologic comfort measures Outcome: Adequate for Discharge   Problem: Health Behavior/Discharge Planning: Goal: Ability to manage health-related needs will improve Outcome: Adequate for Discharge   Problem: Clinical Measurements: Goal: Ability to maintain clinical measurements within normal limits will improve Outcome: Adequate for Discharge Goal: Will remain free from infection Outcome: Adequate for Discharge Goal: Diagnostic test results will improve Outcome: Adequate for Discharge Goal: Respiratory complications will improve Outcome: Adequate for Discharge Goal: Cardiovascular complication will be avoided Outcome: Adequate for Discharge   Problem: Activity: Goal: Risk for activity intolerance will decrease Outcome: Adequate for Discharge   Problem: Nutrition: Goal: Adequate nutrition will be maintained Outcome: Adequate for Discharge   Problem: Coping: Goal: Level of anxiety will decrease Outcome: Adequate for Discharge   Problem: Elimination: Goal: Will not experience complications related to bowel motility Outcome: Adequate for Discharge Goal: Will not experience complications related to urinary retention Outcome: Adequate for Discharge   Problem: Pain Managment: Goal: General experience of comfort will improve and/or be controlled Outcome: Adequate for Discharge   Problem: Safety: Goal: Ability to remain free from injury will improve Outcome: Adequate for Discharge   Problem: Skin Integrity: Goal: Risk for impaired skin integrity will decrease Outcome: Adequate for Discharge   Problem: Education: Goal: Ability to describe self-care measures that may prevent or decrease complications (Diabetes Survival Skills Education) will improve Outcome: Adequate for Discharge Goal: Individualized Educational  Video(s) Outcome: Adequate for Discharge   Problem: Coping: Goal: Ability to adjust to condition or change in health will improve Outcome: Adequate for Discharge   Problem: Fluid Volume: Goal: Ability to maintain a balanced intake and output will improve Outcome: Adequate for Discharge   Problem: Health Behavior/Discharge Planning: Goal: Ability to identify and utilize available resources and services will improve Outcome: Adequate for Discharge Goal: Ability to manage health-related needs will improve Outcome: Adequate for Discharge   Problem: Metabolic: Goal: Ability to maintain appropriate glucose levels will improve Outcome: Adequate for Discharge   Problem: Nutritional: Goal: Maintenance of adequate nutrition will improve Outcome: Adequate for Discharge Goal: Progress toward achieving an optimal weight will improve Outcome: Adequate for Discharge   Problem: Skin Integrity: Goal: Risk for impaired skin integrity will decrease Outcome: Adequate for Discharge   Problem: Tissue Perfusion: Goal: Adequacy of tissue perfusion will improve Outcome: Adequate for Discharge   Problem: Acute Rehab PT Goals(only PT should resolve) Goal: Pt Will Go Supine/Side To Sit Outcome: Adequate for Discharge Goal: Pt Will Go Sit To Supine/Side Outcome: Adequate for Discharge Goal: Patient Will Transfer Sit To/From Stand Outcome: Adequate for Discharge Goal: Pt Will Ambulate Outcome: Adequate for Discharge Goal: Pt Will Go Up/Down Stairs Outcome: Adequate for Discharge

## 2024-04-03 NOTE — Hospital Course (Addendum)
 Hospital course / significant events:   Kristopher Oneal is a 73 y.o. year old male with medical history of hypertension, hyperlipidemia, type 2 diabetes, lumbar spinal stenosis with claudication status post lumbar laminectomy on 03/31/2024 presenting to the ED with lower extremity weakness.   HPI:  Pt was here few days ago for laminectomy 12/11.  He reports his legs were weak prior to surgery but right after surgery and early Friday he felt well. Pt was walking in his hallway and his legs gave out causing him to slide down. Pt sts that he gradually went to the floor, was unable unable to get off the floor. No bowel or bladder incontinence, no change in distal circumferential numbness in setting of peripheral neuropathy, no new radicular pain or sensory loss, no perineal numbness   12/13: to ED. CBC with leukocytosis at 17.8 mild anemia at 12.9 with baseline around 13.5-14. CMP with normal electrolytes and slight BUN elevation. Given patient recent surgery and lower extremity weakness neurosurgery was consulted by EDP and they recommended getting MRI. MRI with dorsal epidural fluid collection in the laminectomy bed resulting in severe compression of the thecal sac which may be postoperative seroma versus CSF leak versus infection. Neurosurgery reviewed images and given no incontinence, these could be postoperative changes. The recommendation was to admit for pain control and mobilization and they will see the patient in consult. PT admitted to hospitalist service.  12/14: OK from neurosurgcial standpoint to dispo once properly set up for ongoing therapy needs, w/ activity as tolerated, can continue solumedrol dose pack to completion.      Consultants:  Neurosurgery   Procedures/Surgeries: This admission: None at this time  {NOTE: PRIOR TO THIS ADMISSION BUT RECENT 03/31/24: L4/5 laminectomy, medial facetectomy and lateral recess decompression w/ Dr Claudene neurosurgery}    ASSESSMENT & PLAN:    Neurogenic claudication d/t severe stenosis :4-L5, s/p L4-L5 laminectomy 03/31/24. Now w/ weakness and ambulatory dysfunction, fall  MRI shows concern for seroma with some compression of the thecal sac, likely postsurgical changes Neurosurgery to follow Pain control PT/OT --> home health  Steroids taper ordered   Leukocytosis  WBC 17 on admission 12/13 --> 11.6 today 12/14 Likely multifactorial in setting of pain, postsurgery state.   No fever chills or other systemic signs of infection.   obtained blood cultures but given other reasons for the leukocytosis but will hold off on initiating antibiotics.   Essential Hypertension --> hypotensive on occasion here  Question low BP contributing to generalized weakness  continue his diltiazem  and metoprolol  given his atrial fibrillation  hold Dyazide  as patient is normotensive to hypotensive.   Hyperlipidemia Continue home statin.   Patient was on Plavix  and that is being held currently.   Type 2 diabetes:  A1c past 3 mos: 7.6 = at goal on home regimen moderate SSI while on steroids    Atrial fibrillation CAD Chronic  currently rate controlled. Metoprolol  and diltiazem   Not on home anticoag - follow w/ cardiology Holding plavix  anyway given recent surgery    Dementia Per neurology likely vascular dementia.   continue memantine .   Continue lamotrigine  for mood symptoms   OA concern for possible inflammatory arthritis.   was seen by rheumatology  Home Plaquenil  - Will hold here as there is some concern for infection.    Overweight / Class 1 obesity based on BMI: Body mass index is 34.9 kg/m.SABRA Significantly low or high BMI is associated with higher medical risk.  Underweight - under 18  overweight - 25 to 29 obese - 30 or more Class 1 obesity: BMI of 30.0 to 34 Class 2 obesity: BMI of 35.0 to 39 Class 3 obesity: BMI of 40.0 to 49 Super Morbid Obesity: BMI 50-59 Super-super Morbid Obesity: BMI 60+ Healthy nutrition and  physical activity advised as adjunct to other disease management and risk reduction treatments

## 2024-04-03 NOTE — Care Management CC44 (Signed)
 Condition Code 44 Documentation Completed  Patient Details  Name: Kristopher Oneal MRN: [id number] Date of Birth: 1950/07/24   Condition Code 44 given:  Yes Patient signature on Condition Code 44 notice:  Yes Documentation of 2 MD's agreement:  Yes Code 44 added to claim:  Yes    Madhuri Vacca LILLETTE Fenton, LCSW 04/03/2024, 1:34 PM

## 2024-04-03 NOTE — Progress Notes (Signed)
 Physical Therapy Evaluation Patient Details Name: Kristopher Oneal MRN: [id number] DOB: October 02, 1950 Today's Date: 04/03/2024  History of Present Illness  Pt is a 73 y/o male admitted secondary to bilateral LE weakness after recent spinal surgery (12/11). Imaging revealed severe compression of the thecal sac, likely post-op seroma or CSF leak. PMH including but not limited to hypertension, hyperlipidemia, type 2 diabetes, lumbar spinal stenosis with claudication status post lumbar laminectomy on 03/31/2024.   Clinical Impression  Pt presented sitting upright in recliner chair, awake and willing to participate in therapy session. Prior to admission, pt reported that he was independent with all functional mobility and ADLs. Pt lives with his wife in an RV with 4 STE (with rail) and 3 steps to get to bathroom inside the RV. At the time of evaluation, pt able to complete transfers with supervision using RW and ambulate 50' in hallway with use of RW and supervision for safety. Pt reporting that his legs feel weak and is agreeable to OP PT services upon d/c from hospital to address his deficits. Pt would continue to benefit from skilled physical therapy services at this time while admitted and after d/c to address the below listed limitations in order to improve overall safety and independence with functional mobility.       If plan is discharge home, recommend the following: Assistance with cooking/housework;Help with stairs or ramp for entrance   Can travel by private vehicle        Equipment Recommendations None recommended by PT  Recommendations for Other Services       Functional Status Assessment Patient has had a recent decline in their functional status and demonstrates the ability to make significant improvements in function in a reasonable and predictable amount of time.     Precautions / Restrictions Precautions Precautions: Back;Fall Restrictions Weight Bearing Restrictions Per Provider  Order: No      Mobility  Bed Mobility               General bed mobility comments: pt OOB in recliner chair upon PT arrival    Transfers Overall transfer level: Needs assistance Equipment used: Rolling walker (2 wheels) Transfers: Sit to/from Stand Sit to Stand: Supervision           General transfer comment: supervision for safety, good technique utilized    Ambulation/Gait Ambulation/Gait assistance: Supervision Gait Distance (Feet): 75 Feet Assistive device: Rolling walker (2 wheels) Gait Pattern/deviations: Step-through pattern, Decreased stride length Gait velocity: decreased     General Gait Details: pt with slow, cautious gait with use of RW, no overt LOB or need for additional external physical assistance  Stairs            Wheelchair Mobility     Tilt Bed    Modified Rankin (Stroke Patients Only)       Balance Overall balance assessment: Needs assistance Sitting-balance support: Feet supported Sitting balance-Leahy Scale: Good     Standing balance support: During functional activity Standing balance-Leahy Scale: Fair                               Pertinent Vitals/Pain Pain Assessment Pain Assessment: No/denies pain    Home Living Family/patient expects to be discharged to:: Private residence Living Arrangements: Spouse/significant other Available Help at Discharge: Family;Available PRN/intermittently Type of Home: Other(Comment) (RV) Home Access: Stairs to enter Entrance Stairs-Rails: Right;Left Entrance Stairs-Number of Steps: 4 to enter with B hand rails. 3  STE bathroom inside RV with single rail.   Home Layout: Other (Comment) (3 steps to bathroom inside RV) Home Equipment: Rolling Walker (2 wheels);Grab bars - tub/shower;Shower seat;Cane - single point      Prior Function Prior Level of Function : Independent/Modified Independent             Mobility Comments: amb with [facility]       Extremity/Trunk  Assessment   Upper Extremity Assessment Upper Extremity Assessment: Defer to OT evaluation;Overall [facility] for tasks assessed    Lower Extremity Assessment Lower Extremity Assessment: Generalized weakness    Cervical / Trunk Assessment Cervical / Trunk Assessment: Back Surgery  Communication   Communication Communication: No apparent difficulties    Cognition Arousal: Alert Behavior During Therapy: WFL for tasks assessed/performed   PT - Cognitive impairments: No apparent impairments                         Following commands: Intact       Cueing Cueing Techniques: Verbal cues     General Comments      Exercises     Assessment/Plan    PT Assessment Patient needs continued PT services  PT Problem List Decreased strength;Decreased balance;Decreased mobility;Decreased coordination;Decreased knowledge of use of DME;Decreased safety awareness;Decreased knowledge of precautions       PT Treatment Interventions DME instruction;Gait training;Functional mobility training;Stair training;Therapeutic activities;Therapeutic exercise;Balance training;Neuromuscular re-education;Patient/family education    PT Goals (Current goals can be found in the Care Plan section)  Acute Rehab PT Goals Patient Stated Goal: to return home PT Goal Formulation: With patient Time For Goal Achievement: 04/17/24 Potential to Achieve Goals: Good    Frequency Min 2X/week     Co-evaluation               AM-PAC PT 6 Clicks Mobility  Outcome Measure Help needed turning from your back to your side while in a flat bed without using bedrails?: A Little Help needed moving from lying on your back to sitting on the side of a flat bed without using bedrails?: A Little Help needed moving to and from a bed to a chair (including a wheelchair)?: None Help needed standing up from a chair using your arms (e.g., wheelchair or bedside chair)?: None Help needed to walk in hospital room?: A  Little Help needed climbing 3-5 steps with a railing? : A Little 6 Click Score: 20    End of Session Equipment Utilized During Treatment: Gait belt Activity Tolerance: Patient tolerated treatment well Patient left: in chair;with call bell/phone within reach Nurse Communication: Mobility status PT Visit Diagnosis: Other abnormalities of gait and mobility (R26.89)    Time: 9177-9163 PT Time Calculation (min) (ACUTE ONLY): 14 min   Charges:   PT Evaluation $PT Eval Low Complexity: 1 Low   PT General Charges $$ ACUTE PT VISIT: 1 Visit         Delon DELENA KLEIN, DPT  Acute Rehabilitation Services Office [phone]   Delon HERO Tuvia Woodrick 04/03/2024, 9:34 AM

## 2024-04-03 NOTE — Discharge Summary (Signed)
 Physician Discharge Summary   Patient: Kristopher Oneal MRN: [id number]  DOB: 03-12-1951   Admit:     Date of Admission: 04/02/2024 Admitted from: home   Discharge: Date of discharge: 04/03/2024 Disposition: Home health Condition at discharge: good  CODE STATUS: FULL CODE     Discharge Physician: Kristopher Blunt, DO [facility]     PCP: Kristopher Oneil FALCON, MD  Recommendations for Outpatient Follow-up:  Follow up with PCP Kristopher Oneil FALCON, MD in 1-2 weeks - sugars, blood pressure Follow up with neurosurgery as directed  Discharge Instructions     Discharge wound care:   Complete by: As directed    Per neurosurgery instructions post-operative.   Increase activity slowly   Complete by: As directed          Discharge Diagnoses: Principal Problem:   Lower extremity weakness Active Problems:   Hypertension associated with diabetes (HCC)   Hyperlipidemia associated with type 2 diabetes mellitus (HCC)   Diabetes mellitus without complication (HCC)   Atrial fibrillation and flutter (HCC)   Leukocytosis   PAD (peripheral artery disease)   Spinal stenosis of lumbar region with neurogenic claudication      Hospital course / significant events:   Kristopher Oneal is a 73 y.o. year old male with medical history of hypertension, hyperlipidemia, type 2 diabetes, lumbar spinal stenosis with claudication status post lumbar laminectomy on 03/31/2024 presenting to the ED with lower extremity weakness.   HPI:  Pt was here few days ago for laminectomy 12/11.  He reports his legs were weak prior to surgery but right after surgery and early Friday he felt well. Pt was walking in his hallway and his legs gave out causing him to slide down. Pt sts that he gradually went to the floor, was unable unable to get off the floor. No bowel or bladder incontinence, no change in distal circumferential numbness in setting of peripheral neuropathy, no new radicular pain or sensory loss, no  perineal numbness   12/13: to ED. CBC with leukocytosis at 17.8 mild anemia at 12.9 with baseline around 13.5-14. CMP with normal electrolytes and slight BUN elevation. Given patient recent surgery and lower extremity weakness neurosurgery was consulted by EDP and they recommended getting MRI. MRI with dorsal epidural fluid collection in the laminectomy bed resulting in severe compression of the thecal sac which may be postoperative seroma versus CSF leak versus infection. Neurosurgery reviewed images and given no incontinence, these could be postoperative changes. The recommendation was to admit for pain control and mobilization and they will see the patient in consult. PT admitted to hospitalist service.  12/14: OK from neurosurgcial standpoint to dispo once properly set up for ongoing therapy needs, w/ activity as tolerated, can continue solumedrol dose pack to completion.      Consultants:  Neurosurgery   Procedures/Surgeries: This admission: None at this time  NOTE: PRIOR TO THIS ADMISSION BUT RECENT 03/31/24: L4/5 laminectomy, medial facetectomy and lateral recess decompression w/ Kristopher Oneal neurosurgery    ASSESSMENT & PLAN:   Neurogenic claudication d/t severe stenosis :4-L5, s/p L4-L5 laminectomy 03/31/24. Now w/ weakness and ambulatory dysfunction, fall  MRI shows concern for seroma with some compression of the thecal sac, likely postsurgical changes Neurosurgery to follow Pain control PT/OT --> home health  Steroids taper ordered   Leukocytosis  WBC 17 on admission 12/13 --> 11.6 today 12/14 Likely multifactorial in setting of pain, postsurgery state.   No fever chills or other systemic signs of infection.  obtained blood cultures but given other reasons for the leukocytosis but will hold off on initiating antibiotics.   Essential Hypertension --> hypotensive on occasion here  Question low BP contributing to generalized weakness  continue his diltiazem  and metoprolol  given  his atrial fibrillation  hold Dyazide  as patient is normotensive to hypotensive.   Hyperlipidemia Continue home statin.   Patient was on Plavix  and that is being held currently.   Type 2 diabetes:  A1c past 3 mos: 7.6 = at goal on home regimen moderate SSI while on steroids    Atrial fibrillation CAD Chronic  currently rate controlled. Metoprolol  and diltiazem   Not on home anticoag - follow w/ cardiology Holding plavix  anyway given recent surgery    Dementia Per neurology likely vascular dementia.   continue memantine .   Continue lamotrigine  for mood symptoms   OA concern for possible inflammatory arthritis.   was seen by rheumatology  Home Plaquenil  - Will hold here as there is some concern for infection.    Overweight / Class 1 obesity based on BMI: Body mass index is 34.9 kg/m.SABRA Significantly low or high BMI is associated with higher medical risk.  Underweight - under 18  overweight - 25 to 29 obese - 30 or more Class 1 obesity: BMI of 30.0 to 34 Class 2 obesity: BMI of 35.0 to 39 Class 3 obesity: BMI of 40.0 to 49 Super Morbid Obesity: BMI 50-59 Super-super Morbid Obesity: BMI 60+ Healthy nutrition and physical activity advised as adjunct to other disease management and risk reduction treatments            Discharge Instructions  Allergies as of 04/03/2024       Reactions   Gabapentin  Other (See Comments)   Confusion        Medication List     PAUSE taking these medications    clopidogrel  75 MG tablet Wait to take this until: April 14, 2024 Commonly known as: PLAVIX  Take 75 mg by mouth in the morning.       STOP taking these medications    methocarbamol  500 MG tablet Commonly known as: ROBAXIN    triamterene -hydrochlorothiazide  37.5-25 MG capsule Commonly known as: DYAZIDE        TAKE these medications    Acetaminophen  Extra Strength 500 MG Tabs Commonly known as: TYLENOL  Take 2 tablets (1,000 mg total) by mouth every 6  (six) hours as needed.   Cartia  XT 300 MG 24 hr capsule Generic drug: diltiazem  Take 300 mg by mouth every evening.   docusate sodium  100 MG capsule Commonly known as: Colace Take 1 capsule (100 mg total) by mouth 2 (two) times daily.   escitalopram  10 MG tablet Commonly known as: LEXAPRO  Take 10 mg by mouth in the morning.   FreeStyle Libre 14 Day Sensor Misc SMARTSIG:1 Kit(s) Topical Every 2 Weeks   glipiZIDE  5 MG tablet Commonly known as: GLUCOTROL  Take 5 mg by mouth in the morning and at bedtime.   HumuLIN  70/30 KwikPen (70-30) 100 UNIT/ML KwikPen Generic drug: insulin  isophane & regular human KwikPen Inject 15-60 Units into the skin daily with breakfast. Pt inject 60 units in the morning and 15 with supper What changed: Another medication with the same name was removed. Continue taking this medication, and follow the directions you see here.   hydroxychloroquine  200 MG tablet Commonly known as: PLAQUENIL  Take 200 mg by mouth 2 (two) times daily.   lamoTRIgine  25 MG tablet Commonly known as: LAMICTAL  Take 50 mg by mouth in the  morning and at bedtime.   lovastatin  40 MG tablet Commonly known as: MEVACOR  Take 1 tablet (40 mg total) by mouth daily with supper.   memantine  5 MG tablet Commonly known as: NAMENDA  Take 5 mg by mouth 2 (two) times daily.   metFORMIN  1000 MG tablet Commonly known as: GLUCOPHAGE  Take 1,000 mg by mouth 2 (two) times daily with a meal.   metoprolol  succinate 100 MG 24 hr tablet Commonly known as: TOPROL -XL Take 1 tablet (100 mg total) by mouth at bedtime.   multivitamin with minerals Tabs tablet Take 1 tablet by mouth in the morning.   oxyCODONE  5 MG immediate release tablet Commonly known as: Roxicodone  Take 1 tablet (5 mg total) by mouth every 4 (four) hours as needed.   pantoprazole  40 MG tablet Commonly known as: PROTONIX  Take 40 mg by mouth every evening.   predniSONE  10 MG tablet Commonly known as: DELTASONE  Take 4 tablets  (40 mg total) by mouth daily for 2 days, THEN 3 tablets (30 mg total) daily for 2 days, THEN 2 tablets (20 mg total) daily for 2 days, THEN 1 tablet (10 mg total) daily for 2 days. Start taking on: April 03, 2024   senna 8.6 MG Tabs tablet Commonly known as: SENOKOT Take 1 tablet (8.6 mg total) by mouth daily.   Vitamin D (Ergocalciferol) 1.25 MG (50000 UNIT) Caps capsule Commonly known as: DRISDOL Take 50,000 Units by mouth every Thursday.               Durable Medical Equipment  (From admission, onward)           Start     Ordered   04/03/24 1231  DME Walker  Once       Question Answer Comment  Walker: With 5 Inch Wheels   Patient needs a walker to treat with the following condition Lumbar stenosis with neurogenic claudication      04/03/24 1231              Discharge Care Instructions  (From admission, onward)           Start     Ordered   04/03/24 0000  Discharge wound care:       Comments: Per neurosurgery instructions post-operative.   04/03/24 1231              Allergies[1]   Subjective: pt reports feeling better today, still wobbly weakness but overall stronger, no pain,    Discharge Exam: BP 131/70 (BP Location: Right Arm)   Pulse 80   Temp 97.7 F (36.5 C) (Oral)   Resp 20   Ht 6' 2 (1.88 m)   Wt 123.2 kg   SpO2 95%   BMI 34.87 kg/m  General: Pt is alert, awake, not in acute distress Cardiovascular: RRR, S1/S2 +, no rubs, no gallops Respiratory: CTA bilaterally, no wheezing, no rhonchi Abdominal: Soft, NT, ND, bowel sounds + Extremities: no edema, no cyanosis     The results of significant diagnostics from this hospitalization (including imaging, microbiology, ancillary and laboratory) are listed below for reference.     Microbiology: Recent Results (from the past 240 hours)  Culture, blood (Routine X 2) w Reflex to ID Panel     Status: None (Preliminary result)   Collection Time: 04/02/24  4:55 PM    Specimen: BLOOD  Result Value Ref Range Status   Specimen Description BLOOD BLOOD LEFT HAND  Final   Special Requests   Final  BOTTLES DRAWN AEROBIC AND ANAEROBIC Blood Culture adequate volume   Culture   Final    NO GROWTH < 12 HOURS Performed at [facility], [address] [place], [place] [address]    Report Status PENDING  Incomplete  Culture, blood (Routine X 2) w Reflex to ID Panel     Status: None (Preliminary result)   Collection Time: 04/02/24  5:00 PM   Specimen: BLOOD  Result Value Ref Range Status   Specimen Description BLOOD RIGHT ANTECUBITAL  Final   Special Requests   Final    BOTTLES DRAWN AEROBIC AND ANAEROBIC Blood Culture adequate volume   Culture   Final    NO GROWTH < 12 HOURS Performed at [facility], [address] [place], [place] [address]    Report Status PENDING  Incomplete     Labs: BNP (last 3 results) No results for input(s): BNP in the last 8760 hours. Basic Metabolic Panel: Recent Labs  Lab 04/02/24 1113 04/03/24 0618  NA 138 135  K 4.3 4.5  CL 103 99  CO2 22 23  GLUCOSE 95 233*  BUN 30* 28*  CREATININE 1.22 1.01  CALCIUM 9.3 8.9  MG 1.6*  --    Liver Function Tests: Recent Labs  Lab 04/02/24 1113  AST 34  ALT 28  ALKPHOS 59  BILITOT 0.6  PROT 7.1  ALBUMIN  4.0   No results for input(s): LIPASE, AMYLASE in the last 168 hours. No results for input(s): AMMONIA in the last 168 hours. CBC: Recent Labs  Lab 04/02/24 1113 04/03/24 0618  WBC 17.8* 11.6*  HGB 12.9* 12.4*  HCT 40.2 39.0  MCV 96.2 96.5  PLT 227 211   Cardiac Enzymes: No results for input(s): CKTOTAL, CKMB, CKMBINDEX, TROPONINI in the last 168 hours. BNP: Invalid input(s): POCBNP CBG: Recent Labs  Lab 03/31/24 0801 03/31/24 1225 04/02/24 2221 04/03/24 0906 04/03/24 1243  GLUCAP 152* 167* 215* 198* 208*   D-Dimer No results for input(s): DDIMER in the last 72 hours. Hgb A1c No results for input(s):  HGBA1C in the last 72 hours. Lipid Profile No results for input(s): CHOL, HDL, LDLCALC, TRIG, CHOLHDL, LDLDIRECT in the last 72 hours. Thyroid function studies No results for input(s): TSH, T4TOTAL, T3FREE, THYROIDAB in the last 72 hours.  Invalid input(s): FREET3 Anemia work up No results for input(s): VITAMINB12, FOLATE, FERRITIN, TIBC, IRON, RETICCTPCT in the last 72 hours. Urinalysis    Component Value Date/Time   COLORURINE YELLOW (A) 12/02/2021 0610   APPEARANCEUR CLEAR (A) 12/02/2021 0610   LABSPEC 1.020 12/02/2021 0610   PHURINE 5.0 12/02/2021 0610   GLUCOSEU 150 (A) 12/02/2021 0610   HGBUR NEGATIVE 12/02/2021 0610   BILIRUBINUR NEGATIVE 12/02/2021 0610   KETONESUR NEGATIVE 12/02/2021 0610   PROTEINUR 30 (A) 12/02/2021 0610   NITRITE NEGATIVE 12/02/2021 0610   LEUKOCYTESUR TRACE (A) 12/02/2021 0610   Sepsis Labs Recent Labs  Lab 04/02/24 1113 04/03/24 0618  WBC 17.8* 11.6*   Microbiology Recent Results (from the past 240 hours)  Culture, blood (Routine X 2) w Reflex to ID Panel     Status: None (Preliminary result)   Collection Time: 04/02/24  4:55 PM   Specimen: BLOOD  Result Value Ref Range Status   Specimen Description BLOOD BLOOD LEFT HAND  Final   Special Requests   Final    BOTTLES DRAWN AEROBIC AND ANAEROBIC Blood Culture adequate volume   Culture   Final    NO GROWTH < 12 HOURS Performed  at [facility], [address], [place], [place] [address]    Report Status PENDING  Incomplete  Culture, blood (Routine X 2) w Reflex to ID Panel     Status: None (Preliminary result)   Collection Time: 04/02/24  5:00 PM   Specimen: BLOOD  Result Value Ref Range Status   Specimen Description BLOOD RIGHT ANTECUBITAL  Final   Special Requests   Final    BOTTLES DRAWN AEROBIC AND ANAEROBIC Blood Culture adequate volume   Culture   Final    NO GROWTH < 12 HOURS Performed at [facility], [address]  [place], [place] [address]    Report Status PENDING  Incomplete   Imaging MR Lumbar Spine W Wo Contrast Result Date: 04/02/2024 EXAM: MRI LUMBAR SPINE 04/02/2024 02:25:58 PM TECHNIQUE: Multiplanar multisequence MRI of the lumbar spine was performed with and without the administration of intravenous contrast. COMPARISON: MRI lumbar spine 12/29/2023. CLINICAL HISTORY: Low back pain, prior surgery, new symptoms; 2 days post op, increasing LE weakness. FINDINGS: BONES AND ALIGNMENT: 5 lumbar type vertebrae. Slight lumbar dextroscoliosis. Unchanged trace retrolisthesis of L5 on S1. No fracture, suspicious marrow lesion, or significant marrow edema. SPINAL CORD: The conus terminates at L1 and is normal in signal. SOFT TISSUES: New postoperative changes in the posterior lumbar soft tissues. A fluid collection in the laminectomy bed at L4-L5 measures 2.3 x 1.7 x 1.9 cm (AP x transverse x craniocaudal) without significant rim enhancement. There is expected postoperative enhancement in the overlying, more superficial soft tissues. 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 DISC LEVELS: Disc desiccation throughout the lumbar spine. Severe disc space narrowing at L5-S1. T12-L1: Mild facet hypertrophy and at most minimal disc bulging without stenosis, unchanged. L1-L2: Minimal disc bulging and mild facet hypertrophy without stenosis, unchanged. L2-L3: Disc bulging and moderate facet hypertrophy result in mild bilateral neural foraminal stenosis without spinal stenosis, unchanged. L3-L4: Disc bulging and moderate to severe facet hypertrophy result in mild spinal stenosis and mild left greater than right neural foraminal stenosis, unchanged. L4-L5: Interval laminectomies. The above described dorsal epidural fluid collection, disc bulging, and a left central disc protrusion result in severe compression of the thecal sac (4 mm AP diameter).  Mild left greater than right neural foraminal stenosis. L5-S1: Disc bulging, endplate spurring, and mild to moderate facet hypertrophy without significant stenosis, unchanged. IMPRESSION: 1. Interval posterior decompression at L4-L5. A dorsal epidural fluid collection in the laminectomy bed results in severe compression of the thecal sac and could reflect a postoperative seroma or CSF leak with infection not excluded by imaging. 2. Unchanged disc and facet degeneration elsewhere with mild spinal stenosis at L3-L4. Electronically signed by: Dasie Hamburg MD 04/02/2024 02:53 PM EST RP Workstation: [id number]      Time coordinating discharge: over 30 minutes  SIGNED:  Jeane Cashatt DO [facility]       [1]  Allergies Allergen Reactions   Gabapentin  Other (See Comments)    Confusion

## 2024-04-03 NOTE — Plan of Care (Signed)
 Problem: Education: Goal: Knowledge of General Education information will improve Description: Including pain rating scale, medication(s)/side effects and non-pharmacologic comfort measures 04/03/2024 0753 by Georg Ozell BRAVO, LPN Outcome: Progressing 04/03/2024 0701 by Georg Ozell BRAVO, LPN Outcome: Progressing   Problem: Health Behavior/Discharge Planning: Goal: Ability to manage health-related needs will improve 04/03/2024 0753 by Georg Ozell BRAVO, LPN Outcome: Progressing 04/03/2024 0701 by Georg Ozell BRAVO, LPN Outcome: Progressing   Problem: Clinical Measurements: Goal: Ability to maintain clinical measurements within normal limits will improve 04/03/2024 0753 by Georg Ozell BRAVO, LPN Outcome: Progressing 04/03/2024 0701 by Georg Ozell BRAVO, LPN Outcome: Progressing Goal: Will remain free from infection 04/03/2024 0753 by Georg Ozell BRAVO, LPN Outcome: Progressing 04/03/2024 0701 by Georg Ozell BRAVO, LPN Outcome: Progressing Goal: Diagnostic test results will improve 04/03/2024 0753 by Georg Ozell BRAVO, LPN Outcome: Progressing 04/03/2024 0701 by Georg Ozell BRAVO, LPN Outcome: Progressing Goal: Respiratory complications will improve 04/03/2024 0753 by Georg Ozell BRAVO, LPN Outcome: Progressing 04/03/2024 0701 by Georg Ozell BRAVO, LPN Outcome: Progressing Goal: Cardiovascular complication will be avoided 04/03/2024 0753 by Georg Ozell BRAVO, LPN Outcome: Progressing 04/03/2024 0701 by Georg Ozell BRAVO, LPN Outcome: Progressing   Problem: Activity: Goal: Risk for activity intolerance will decrease 04/03/2024 0753 by Georg Ozell BRAVO, LPN Outcome: Progressing 04/03/2024 0701 by Georg Ozell BRAVO, LPN Outcome: Progressing   Problem: Nutrition: Goal: Adequate nutrition will be maintained 04/03/2024 0753 by Georg Ozell BRAVO, LPN Outcome: Progressing 04/03/2024 0701 by Georg Ozell BRAVO, LPN Outcome:  Progressing   Problem: Coping: Goal: Level of anxiety will decrease 04/03/2024 0753 by Georg Ozell BRAVO, LPN Outcome: Progressing 04/03/2024 0701 by Georg Ozell BRAVO, LPN Outcome: Progressing   Problem: Elimination: Goal: Will not experience complications related to bowel motility 04/03/2024 0753 by Georg Ozell BRAVO, LPN Outcome: Progressing 04/03/2024 0701 by Georg Ozell BRAVO, LPN Outcome: Progressing Goal: Will not experience complications related to urinary retention 04/03/2024 0753 by Georg Ozell BRAVO, LPN Outcome: Progressing 04/03/2024 0701 by Georg Ozell BRAVO, LPN Outcome: Progressing   Problem: Pain Managment: Goal: General experience of comfort will improve and/or be controlled 04/03/2024 0753 by Georg Ozell BRAVO, LPN Outcome: Progressing 04/03/2024 0701 by Georg Ozell BRAVO, LPN Outcome: Progressing   Problem: Safety: Goal: Ability to remain free from injury will improve 04/03/2024 0753 by Georg Ozell BRAVO, LPN Outcome: Progressing 04/03/2024 0701 by Georg Ozell BRAVO, LPN Outcome: Progressing   Problem: Skin Integrity: Goal: Risk for impaired skin integrity will decrease 04/03/2024 0753 by Georg Ozell BRAVO, LPN Outcome: Progressing 04/03/2024 0701 by Georg Ozell BRAVO, LPN Outcome: Progressing   Problem: Education: Goal: Ability to describe self-care measures that may prevent or decrease complications (Diabetes Survival Skills Education) will improve Outcome: Progressing Goal: Individualized Educational Video(s) Outcome: Progressing   Problem: Coping: Goal: Ability to adjust to condition or change in health will improve 04/03/2024 0753 by Georg Ozell BRAVO, LPN Outcome: Progressing 04/03/2024 0701 by Georg Ozell BRAVO, LPN Outcome: Progressing   Problem: Fluid Volume: Goal: Ability to maintain a balanced intake and output will improve 04/03/2024 0753 by Georg Ozell BRAVO, LPN Outcome: Progressing 04/03/2024  0701 by Georg Ozell BRAVO, LPN Outcome: Progressing   Problem: Health Behavior/Discharge Planning: Goal: Ability to identify and utilize available resources and services will improve 04/03/2024 0753 by Georg Ozell BRAVO, LPN Outcome: Progressing 04/03/2024 0701 by Georg Ozell BRAVO, LPN Outcome: Progressing Goal: Ability to manage health-related needs will improve 04/03/2024 0753 by Georg Ozell BRAVO, LPN Outcome: Progressing 04/03/2024 0701 by Georg Ozell BRAVO, LPN Outcome: Progressing  Problem: Metabolic: Goal: Ability to maintain appropriate glucose levels will improve 04/03/2024 0753 by Georg Ozell BRAVO, LPN Outcome: Progressing 04/03/2024 0701 by Georg Ozell BRAVO, LPN Outcome: Progressing   Problem: Nutritional: Goal: Maintenance of adequate nutrition will improve 04/03/2024 0753 by Georg Ozell BRAVO, LPN Outcome: Progressing 04/03/2024 0701 by Georg Ozell BRAVO, LPN Outcome: Progressing Goal: Progress toward achieving an optimal weight will improve 04/03/2024 0753 by Georg Ozell BRAVO, LPN Outcome: Progressing 04/03/2024 0701 by Georg Ozell BRAVO, LPN Outcome: Progressing   Problem: Skin Integrity: Goal: Risk for impaired skin integrity will decrease 04/03/2024 0753 by Georg Ozell BRAVO, LPN Outcome: Progressing 04/03/2024 0701 by Georg Ozell BRAVO, LPN Outcome: Progressing   Problem: Tissue Perfusion: Goal: Adequacy of tissue perfusion will improve 04/03/2024 0753 by Georg Ozell BRAVO, LPN Outcome: Progressing 04/03/2024 0701 by Georg Ozell BRAVO, LPN Outcome: Progressing

## 2024-04-07 LAB — CULTURE, BLOOD (ROUTINE X 2)
Culture: NO GROWTH
Culture: NO GROWTH
Special Requests: ADEQUATE
Special Requests: ADEQUATE

## 2024-04-10 NOTE — Progress Notes (Unsigned)
" ° °  REFERRING PHYSICIAN:  Cleotilde Oneil FALCON, Md [address] [facility] [place],  [place] [address]  DOS: 03/31/24  L4-L5 decompression  HISTORY OF PRESENT ILLNESS: Kristopher Oneal is approximately 2 weeks status post above surgery. Was given oxycodone  and robaxin  on discharge from the hospital.   He was readmitted on 04/02/24 with bilateral leg weakness and pain control. Postop MRI showed expected postop fluid collection- no significant compression.   He was discharged on 04/03/24- he was to stop robaxin . He was given tapered dose of prednisone  and was to continue prn oxycodone .   He has no back pain. He feels like the tiredness in his legs and back that he had preop is getting better. He feels like his walking has improved, he is not shuffling as much.   He is not taking oxycodone .    PHYSICAL EXAMINATION:  General: Patient is well developed, well nourished, calm, collected, and in no apparent distress.   NEUROLOGICAL:  General: In no acute distress.   Awake, alert, oriented to person, place, and time.  Pupils equal round and reactive to light.  Facial tone is symmetric.     Strength:           Side Iliopsoas Quads Hamstring PF DF EHL  R 5 5 5 5 5 5   L 5 5 5 5 5 5    Incision c/d/I, staples were removed without complication   ROS (Neurologic):  Negative except as noted above  IMAGING: Nothing new to review.   ASSESSMENT/PLAN:  Kristopher Oneal is doing well s/p above surgery. Treatment options reviewed with patient and following plan made:   - I have advised the patient to lift up to 10 pounds until 6 weeks after surgery (follow up with Dr. Claudene).  - Reviewed wound care.  - No bending, twisting, or lifting.  - Restart PLAVIX  on 04/14/24.  - Follow up as scheduled in 4 weeks and prn.   Advised to contact the office if any questions or concerns arise.  Glade Boys PA-C Department of neurosurgery "

## 2024-04-12 ENCOUNTER — Encounter: Payer: Self-pay | Admitting: Orthopedic Surgery

## 2024-04-12 ENCOUNTER — Ambulatory Visit: Admitting: Orthopedic Surgery

## 2024-04-12 VITALS — BP 136/82 | Temp 98.5°F | Wt 271.0 lb

## 2024-04-12 DIAGNOSIS — M48062 Spinal stenosis, lumbar region with neurogenic claudication: Secondary | ICD-10-CM

## 2024-04-12 DIAGNOSIS — Z9889 Other specified postprocedural states: Secondary | ICD-10-CM

## 2024-04-25 ENCOUNTER — Ambulatory Visit (INDEPENDENT_AMBULATORY_CARE_PROVIDER_SITE_OTHER): Admitting: Vascular Surgery

## 2024-04-25 ENCOUNTER — Encounter (INDEPENDENT_AMBULATORY_CARE_PROVIDER_SITE_OTHER)

## 2024-05-05 ENCOUNTER — Other Ambulatory Visit (INDEPENDENT_AMBULATORY_CARE_PROVIDER_SITE_OTHER): Payer: Self-pay | Admitting: Vascular Surgery

## 2024-05-05 DIAGNOSIS — I739 Peripheral vascular disease, unspecified: Secondary | ICD-10-CM

## 2024-05-08 NOTE — Progress Notes (Unsigned)
 "                                                                      MRN : [id number]  Kristopher Oneal is a 74 y.o. (June 26, 1950) male who presents with chief complaint of check circulation.  History of Present Illness:   The patient returns to the office for followup and review status post angiogram with intervention on 02/10/2023.    Procedure: Procedure(s) Performed:             1.  Introduction catheter into left lower extremity 3rd order catheter placement               2.  Contrast injection left lower extremity for distal runoff with additional 3rd order             3.  Percutaneous transluminal angioplasty left posterior tibial artery to 4 mm                          4.  Percutaneous transluminal angioplasty left tibioperoneal trunk to 4 mm             5.  Star close closure right common femoral arteriotomy   He has had amputation of the left great toe which has healed.  His claudication distance is stable compared to his last visit.  He does endorse having back pain during these claudication episodes and has a history of previous intervention on his lower back.   There have been no significant changes to the patient's overall health care.   No documented history of amaurosis fugax or recent TIA symptoms. There are no recent neurological changes noted. No documented history of DVT, PE or superficial thrombophlebitis.  The patient denies recent episodes of angina or shortness of breath.    ABI's Rt=1.10 and Lt=1.12  (previous ABI's Rt=1.36 and Lt=1.36).  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   a.) Lumbar MRI 12/29/2023: 3.0 x 3.7 cm   Anxiety    Aortic atherosclerosis    Arthritis     Atrial fibrillation (HCC)    a.) CHA2DS2VASc = 8 (age x2, HFpEF, HTN, CVA x 2, vascular disease, T2DM) as of 03/28/2024; b.) rate/rhythm maintained on oral diltiazem  + metropolol succinate; no OAC (self discontinued in 11/2023 due to financial contraints); does take clopidogrel    Cerebral microvascular disease    CKD (chronic kidney disease), stage III (HCC)    CVA (cerebral vascular accident) (HCC)    a.) noted on brain MRI 01/13/2024: cortical infarcts in both the LEFT parietal and RIGHT frontal lobes   GERD (gastroesophageal reflux disease)    Heart murmur    High cholesterol    Hypertension    Inflammatory arthritis    a.) on long term hydroxychloroquine    Long term current use of clopidogrel     Long-term use of hydroxychloroquine     a.) used for inflammatory arthritits Dx   Mood disturbance    PAD (peripheral  artery disease)    Spinal stenosis of lumbar region with neurogenic claudication    T2DM (type 2 diabetes mellitus) (HCC)    Vascular dementia (HCC)    a.) on NMDA receptor antagonist (memantine )   Vitamin D deficiency     Past Surgical History:  Procedure Laterality Date   AMPUTATION TOE Left 02/10/2023   Procedure: Left Hallux Amputation;  Surgeon: Neill Boas, DPM;  Location: [facility];  Service: Orthopedics/Podiatry;  Laterality: Left;   BACK SURGERY     l5/s1   COLONOSCOPY     DECOMPRESSIVE LUMBAR LAMINECTOMY LEVEL 1 N/A 03/31/2024   Procedure: Lumbar laminectomy, medial facetectomy and lateral recess decompression, L4-5, midline approach;  Surgeon: Claudene Penne ORN, MD;  Location: [facility];  Service: Neurosurgery;  Laterality: N/A;   KNEE ARTHROPLASTY Right 01/02/2020   Procedure: COMPUTER ASSISTED TOTAL KNEE ARTHROPLASTY;  Surgeon: Mardee Lynwood SQUIBB, MD;  Location: [facility];  Service: Orthopedics;  Laterality: Right;   KNEE ARTHROPLASTY Left 11/19/2020   Procedure: COMPUTER ASSISTED TOTAL KNEE ARTHROPLASTY;  Surgeon: Mardee Lynwood SQUIBB, MD;  Location: [facility];   Service: Orthopedics;  Laterality: Left;   LOWER EXTREMITY ANGIOGRAPHY Left 02/10/2023   Procedure: Lower Extremity Angiography;  Surgeon: Jama Cordella MATSU, MD;  Location: [facility];  Service: Cardiovascular;  Laterality: Left;   ORIF PATELLA Left 04/16/2021   Procedure: OPEN REDUCTION INTERNAL (ORIF) FIXATION PATELLA;  Surgeon: Mardee Lynwood SQUIBB, MD;  Location: [facility];  Service: Orthopedics;  Laterality: Left;   ORIF PATELLA Left 05/10/2021   Procedure: OPEN REDUCTION INTERNAL (ORIF) FIXATION PATELLA;  Surgeon: Mardee Lynwood SQUIBB, MD;  Location: [facility];  Service: Orthopedics;  Laterality: Left;    Social History Social History[2]  Family History Family History  Problem Relation Age of Onset   Dementia Mother    Heart attack Father    Emphysema Father    Heart attack Brother     Allergies[3]   REVIEW OF SYSTEMS (Negative unless checked)  Constitutional: [] Weight loss  [] Fever  [] Chills Cardiac: [] Chest pain   [] Chest pressure   [] Palpitations   [] Shortness of breath when laying flat   [] Shortness of breath with exertion. Vascular:  [x] Pain in legs with walking   [] Pain in legs at rest  [] History of DVT   [] Phlebitis   [] Swelling in legs   [] Varicose veins   [] Non-healing ulcers Pulmonary:   [] Uses home oxygen   [] Productive cough   [] Hemoptysis   [] Wheeze  [] COPD   [] Asthma Neurologic:  [] Dizziness   [] Seizures   [] History of stroke   [] History of TIA  [] Aphasia   [] Vissual changes   [] Weakness or numbness in arm   [] Weakness or numbness in leg Musculoskeletal:   [] Joint swelling   [] Joint pain   [] Low back pain Hematologic:  [] Easy bruising  [] Easy bleeding   [] Hypercoagulable state   [] Anemic Gastrointestinal:  [] Diarrhea   [] Vomiting  [] Gastroesophageal reflux/heartburn   [] Difficulty swallowing. Genitourinary:  [] Chronic kidney disease   [] Difficult urination  [] Frequent urination   [] Blood in urine Skin:  [] Rashes   [] Ulcers  Psychological:  [] History of anxiety    []  History of major depression.  Physical Examination  There were no vitals filed for this visit. There is no height or weight on file to calculate BMI. Gen: WD/WN, NAD Head: [place]/AT, No temporalis wasting.  Ear/Nose/Throat: Hearing grossly intact, nares w/o erythema or drainage Eyes: PER, EOMI, sclera nonicteric.  Neck: Supple, no masses.  No bruit or JVD.  Pulmonary:  Good air movement, no audible wheezing, no use of accessory muscles.  Cardiac: RRR, normal S1, S2, no Murmurs. Vascular:  mild trophic changes, no open wounds Vessel Right Left  Radial Palpable Palpable  PT Not Palpable Not Palpable  DP Not Palpable Not Palpable  Gastrointestinal: soft, non-distended. No guarding/no peritoneal signs.  Musculoskeletal: M/S 5/5 throughout.  No visible deformity.  Neurologic: CN 2-12 intact. Pain and light touch intact in extremities.  Symmetrical.  Speech is fluent. Motor exam as listed above. Psychiatric: Judgment intact, Mood & affect appropriate for pt's clinical situation. Dermatologic: No rashes or ulcers noted.  No changes consistent with cellulitis.   CBC Lab Results  Component Value Date   WBC 11.6 (H) 04/03/2024   HGB 12.4 (L) 04/03/2024   HCT 39.0 04/03/2024   MCV 96.5 04/03/2024   PLT 211 04/03/2024    BMET    Component Value Date/Time   NA 135 04/03/2024 0618   K 4.5 04/03/2024 0618   CL 99 04/03/2024 0618   CO2 23 04/03/2024 0618   GLUCOSE 233 (H) 04/03/2024 0618   BUN 28 (H) 04/03/2024 0618   CREATININE 1.01 04/03/2024 0618   CALCIUM 8.9 04/03/2024 0618   GFRNONAA >60 04/03/2024 0618   GFRAA >60 12/27/2019 1108   CrCl cannot be calculated (Patient's most recent lab result is older than the maximum 21 days allowed.).  COAG Lab Results  Component Value Date   INR 1.2 02/05/2023   INR 1.1 12/02/2021   INR 1.0 11/08/2020    Radiology No results found.   Assessment/Plan 1. PAD (peripheral artery disease) (Primary) Recommend:   The patient is  status post successful angiogram with intervention.  The patient reports that the amputation site has completely healed and leg pain has improved.  The patient denies lifestyle limiting changes at this point in time.   No further invasive studies, angiography or surgery at this time. The patient should continue walking and begin a more formal exercise program.  The patient should continue antiplatelet therapy and aggressive treatment of the lipid abnormalities   Continued surveillance is indicated as atherosclerosis is likely to progress with time.     Patient should undergo noninvasive studies as ordered. The patient will follow up in 6 months with noninvasive studies.  - VAS US  LOWER EXTREMITY ARTERIAL DUPLEX; Future - VAS US  ABI WITH/WO TBI; Future  2. Atrial fibrillation and flutter (HCC) Continue antiarrhythmia medications as already ordered, these medications have been reviewed and there are no changes at this time.  Continue anticoagulation as ordered by Cardiology Service  3. Benign essential hypertension Continue antihypertensive medications as already ordered, these medications have been reviewed and there are no changes at this time.  4. DM type 2 with diabetic mixed hyperlipidemia (HCC) Continue hypoglycemic medications as already ordered, these medications have been reviewed and there are no changes at this time.  Hgb A1C to be monitored as already arranged by primary service  5. Hyperlipidemia associated with type 2 diabetes mellitus (HCC) Continue statin as ordered and reviewed, no changes at this time    Cordella Shawl, MD  05/08/2024 2:07 PM      [1]  No outpatient medications have been marked as taking for the 05/09/24 encounter (Appointment) with Shawl, Cordella MATSU, MD.  [2]  Social History Tobacco Use   Smoking status: Former    Current packs/day: 0.00    Types: Cigarettes    Quit date: 1980    Years since quitting: 46.0   Smokeless  tobacco: Never    Tobacco comments:    1980  Vaping Use   Vaping status: Never Used  Substance Use Topics   Alcohol use: Yes    Comment: rarely   Drug use: Never  [3]  Allergies Allergen Reactions   Gabapentin  Other (See Comments)    Confusion   "

## 2024-05-09 ENCOUNTER — Encounter (INDEPENDENT_AMBULATORY_CARE_PROVIDER_SITE_OTHER): Payer: Self-pay | Admitting: Vascular Surgery

## 2024-05-09 ENCOUNTER — Ambulatory Visit (INDEPENDENT_AMBULATORY_CARE_PROVIDER_SITE_OTHER): Admitting: Vascular Surgery

## 2024-05-09 ENCOUNTER — Ambulatory Visit (INDEPENDENT_AMBULATORY_CARE_PROVIDER_SITE_OTHER)

## 2024-05-09 VITALS — BP 113/71 | HR 65 | Resp 18 | Ht 74.0 in | Wt 264.2 lb

## 2024-05-09 DIAGNOSIS — E782 Mixed hyperlipidemia: Secondary | ICD-10-CM

## 2024-05-09 DIAGNOSIS — I4892 Unspecified atrial flutter: Secondary | ICD-10-CM

## 2024-05-09 DIAGNOSIS — I4891 Unspecified atrial fibrillation: Secondary | ICD-10-CM

## 2024-05-09 DIAGNOSIS — I10 Essential (primary) hypertension: Secondary | ICD-10-CM

## 2024-05-09 DIAGNOSIS — I739 Peripheral vascular disease, unspecified: Secondary | ICD-10-CM | POA: Diagnosis not present

## 2024-05-09 DIAGNOSIS — E785 Hyperlipidemia, unspecified: Secondary | ICD-10-CM

## 2024-05-09 DIAGNOSIS — Z9889 Other specified postprocedural states: Secondary | ICD-10-CM

## 2024-05-09 DIAGNOSIS — E1169 Type 2 diabetes mellitus with other specified complication: Secondary | ICD-10-CM | POA: Diagnosis not present

## 2024-05-11 ENCOUNTER — Ambulatory Visit (INDEPENDENT_AMBULATORY_CARE_PROVIDER_SITE_OTHER): Admitting: Neurosurgery

## 2024-05-11 ENCOUNTER — Encounter: Payer: Self-pay | Admitting: Neurosurgery

## 2024-05-11 VITALS — BP 134/78 | Temp 98.7°F | Ht 74.0 in | Wt 254.0 lb

## 2024-05-11 DIAGNOSIS — Z9889 Other specified postprocedural states: Secondary | ICD-10-CM

## 2024-05-11 DIAGNOSIS — M48062 Spinal stenosis, lumbar region with neurogenic claudication: Secondary | ICD-10-CM

## 2024-05-11 NOTE — Progress Notes (Unsigned)
" ° °  REFERRING PHYSICIAN:  Cleotilde Oneil FALCON, Md [address] [facility] [place],  [place] [address]  DOS: 03/31/24  L4-L5 decompression  Discussed the use of AI scribe software for clinical note transcription with the patient, who gave verbal consent to proceed.  History of Present Illness Kristopher Oneal is a 74 year old male with lumbar spinal stenosis, status post recent lumbar decompression, who presents for postoperative follow-up due to persistent lower extremity fatigue and instability.  After lumbar decompression he was found on postoperative MRI to have a fluid collection at the surgical site and was evaluated in the emergency department. He denies uncontrolled leg pain or wound dehiscence.  He continues to have marked lower extremity fatigue when standing more than 3-4 minutes, leading to exhaustion and need to sit, with persistent instability on his feet, especially on stairs. Gait and posture are improved with less back pain since surgery. He is not in physical therapy now but previously completed about six weeks of preoperative therapy without benefit.  He denies worsening leg numbness, tingling, or pain. Prior burning neuropathic pain in the foot has improved since surgery.  He has lower extremity arterial disease with prior toe amputation and follows with vascular surgery every three months.  He had significant hyperglycemia after perioperative steroids, which has resolved. He takes metformin , lamotrigine , metoprolol , and diltiazem  and is concerned these may contribute to his fatigue and instability.   PHYSICAL EXAMINATION:  General: Patient is well developed, well nourished, calm, collected, and in no apparent distress.   NEUROLOGICAL:  General: In no acute distress.   Awake, alert, oriented to person, place, and time.  Pupils equal round and reactive to light.  Facial tone is symmetric.     Strength:           Side Iliopsoas Quads Hamstring PF  DF EHL  R 5 5 5 5 5 5   L 5 5 5 5 5 5    Incision c/d/I, staples were removed without complication   ROS (Neurologic):  Negative except as noted above  IMAGING: Nothing new to review.   Assessment and Plan Assessment & Plan Lumbar spinal stenosis, status post lumbar surgery Recovering from lumbar decompression for severe lumbar spinal stenosis. Gait and posture have improved, with reduced back pain and enhanced mobility. Persistent lower extremity fatigue and instability remain, particularly after prolonged standing, but no new or worsening neurological deficits. Incision is healing well with minor suture-related issues. Small postoperative seroma on MRI is resolving and considered a normal postoperative finding. Gradual recovery is anticipated, with physical therapy expected to further improve strength and stability. Discussed risks include wound dehiscence, increased leg pain, and new neurological symptoms. - Referred to physical therapy for gait and strength training. - Provided options for physical therapy locations in discharge paperwork. - Advised to continue gradual activity as tolerated. - Removed protruding suture material from surgical incision and instructed on management of future protruding sutures.  Peripheral neuropathy Peripheral neuropathy with prior burning sensations in the foot. Reports improvement in neuropathic symptoms following lumbar decompression, consistent with relief of nerve compression. No new or worsening neuropathic symptoms.  [name] MD Department of neurosurgery "

## 2024-05-12 LAB — VAS US ABI WITH/WO TBI
Left ABI: 1.12
Right ABI: 1.1

## 2024-06-14 ENCOUNTER — Encounter: Admitting: Orthopedic Surgery

## 2024-08-15 ENCOUNTER — Ambulatory Visit (INDEPENDENT_AMBULATORY_CARE_PROVIDER_SITE_OTHER): Admitting: Vascular Surgery

## 2024-08-15 ENCOUNTER — Encounter (INDEPENDENT_AMBULATORY_CARE_PROVIDER_SITE_OTHER)
